# Patient Record
Sex: Male | Born: 1980 | Race: Black or African American | Hispanic: No | Marital: Single | Smoking: Current some day smoker
Health system: Southern US, Community
[De-identification: ages and names within clinical notes are randomized; demographics above are authoritative.]

## PROBLEM LIST (undated history)

## (undated) DIAGNOSIS — C9000 Multiple myeloma not having achieved remission: Secondary | ICD-10-CM

## (undated) DIAGNOSIS — K922 Gastrointestinal hemorrhage, unspecified: Secondary | ICD-10-CM

## (undated) DIAGNOSIS — K5090 Crohn's disease, unspecified, without complications: Secondary | ICD-10-CM

## (undated) DIAGNOSIS — F209 Schizophrenia, unspecified: Secondary | ICD-10-CM

## (undated) DIAGNOSIS — T50902A Poisoning by unspecified drugs, medicaments and biological substances, intentional self-harm, initial encounter: Secondary | ICD-10-CM

## (undated) DIAGNOSIS — D571 Sickle-cell disease without crisis: Secondary | ICD-10-CM

## (undated) DIAGNOSIS — S82841A Displaced bimalleolar fracture of right lower leg, initial encounter for closed fracture: Secondary | ICD-10-CM

## (undated) HISTORY — DX: Crohn's disease, unspecified, without complications: K50.90

## (undated) HISTORY — PX: MANDIBLE FRACTURE SURGERY: SHX706

---

## 1998-12-27 ENCOUNTER — Emergency Department (HOSPITAL_COMMUNITY): Admission: EM | Admit: 1998-12-27 | Discharge: 1998-12-27 | Payer: Self-pay | Admitting: Emergency Medicine

## 1999-01-06 ENCOUNTER — Emergency Department (HOSPITAL_COMMUNITY): Admission: EM | Admit: 1999-01-06 | Discharge: 1999-01-06 | Payer: Self-pay | Admitting: Emergency Medicine

## 1999-03-24 ENCOUNTER — Emergency Department (HOSPITAL_COMMUNITY): Admission: EM | Admit: 1999-03-24 | Discharge: 1999-03-24 | Payer: Self-pay | Admitting: Emergency Medicine

## 1999-03-30 ENCOUNTER — Emergency Department (HOSPITAL_COMMUNITY): Admission: EM | Admit: 1999-03-30 | Discharge: 1999-03-30 | Payer: Self-pay | Admitting: Emergency Medicine

## 2001-04-12 ENCOUNTER — Inpatient Hospital Stay (HOSPITAL_COMMUNITY): Admission: EM | Admit: 2001-04-12 | Discharge: 2001-04-19 | Payer: Self-pay | Admitting: Psychiatry

## 2001-05-29 ENCOUNTER — Inpatient Hospital Stay (HOSPITAL_COMMUNITY): Admission: EM | Admit: 2001-05-29 | Discharge: 2001-06-09 | Payer: Self-pay | Admitting: Psychiatry

## 2002-08-07 ENCOUNTER — Emergency Department (HOSPITAL_COMMUNITY): Admission: EM | Admit: 2002-08-07 | Discharge: 2002-08-07 | Payer: Self-pay

## 2004-12-31 ENCOUNTER — Emergency Department (HOSPITAL_COMMUNITY): Admission: EM | Admit: 2004-12-31 | Discharge: 2004-12-31 | Payer: Self-pay | Admitting: Emergency Medicine

## 2005-04-01 ENCOUNTER — Ambulatory Visit: Payer: Self-pay | Admitting: Family Medicine

## 2005-04-01 ENCOUNTER — Inpatient Hospital Stay (HOSPITAL_COMMUNITY): Admission: EM | Admit: 2005-04-01 | Discharge: 2005-04-06 | Payer: Self-pay | Admitting: Emergency Medicine

## 2005-04-04 ENCOUNTER — Encounter (INDEPENDENT_AMBULATORY_CARE_PROVIDER_SITE_OTHER): Payer: Self-pay | Admitting: *Deleted

## 2006-02-20 ENCOUNTER — Emergency Department (HOSPITAL_COMMUNITY): Admission: EM | Admit: 2006-02-20 | Discharge: 2006-02-20 | Payer: Self-pay | Admitting: Family Medicine

## 2006-07-25 ENCOUNTER — Emergency Department (HOSPITAL_COMMUNITY): Admission: EM | Admit: 2006-07-25 | Discharge: 2006-07-25 | Payer: Self-pay | Admitting: Family Medicine

## 2006-08-24 ENCOUNTER — Emergency Department (HOSPITAL_COMMUNITY): Admission: EM | Admit: 2006-08-24 | Discharge: 2006-08-24 | Payer: Self-pay | Admitting: Emergency Medicine

## 2007-03-17 ENCOUNTER — Emergency Department (HOSPITAL_COMMUNITY): Admission: EM | Admit: 2007-03-17 | Discharge: 2007-03-17 | Payer: Self-pay | Admitting: Family Medicine

## 2007-05-17 ENCOUNTER — Emergency Department (HOSPITAL_COMMUNITY): Admission: EM | Admit: 2007-05-17 | Discharge: 2007-05-17 | Payer: Self-pay | Admitting: Emergency Medicine

## 2007-06-17 ENCOUNTER — Emergency Department (HOSPITAL_COMMUNITY): Admission: EM | Admit: 2007-06-17 | Discharge: 2007-06-17 | Payer: Self-pay | Admitting: Emergency Medicine

## 2007-06-24 ENCOUNTER — Ambulatory Visit (HOSPITAL_COMMUNITY): Admission: RE | Admit: 2007-06-24 | Discharge: 2007-06-25 | Payer: Self-pay | Admitting: Otolaryngology

## 2007-07-01 ENCOUNTER — Inpatient Hospital Stay (HOSPITAL_COMMUNITY): Admission: AD | Admit: 2007-07-01 | Discharge: 2007-07-03 | Payer: Self-pay | Admitting: Otolaryngology

## 2007-08-14 ENCOUNTER — Ambulatory Visit (HOSPITAL_COMMUNITY): Admission: RE | Admit: 2007-08-14 | Discharge: 2007-08-14 | Payer: Self-pay | Admitting: Otolaryngology

## 2007-08-22 ENCOUNTER — Inpatient Hospital Stay (HOSPITAL_COMMUNITY): Admission: RE | Admit: 2007-08-22 | Discharge: 2007-08-31 | Payer: Self-pay | Admitting: Otolaryngology

## 2007-08-27 ENCOUNTER — Ambulatory Visit: Payer: Self-pay | Admitting: Infectious Diseases

## 2007-12-20 ENCOUNTER — Ambulatory Visit: Payer: Self-pay | Admitting: Internal Medicine

## 2007-12-20 ENCOUNTER — Inpatient Hospital Stay (HOSPITAL_COMMUNITY): Admission: EM | Admit: 2007-12-20 | Discharge: 2007-12-24 | Payer: Self-pay | Admitting: Emergency Medicine

## 2007-12-30 ENCOUNTER — Telehealth: Payer: Self-pay | Admitting: *Deleted

## 2007-12-31 ENCOUNTER — Encounter: Admission: RE | Admit: 2007-12-31 | Discharge: 2007-12-31 | Payer: Self-pay | Admitting: Hospitalist

## 2007-12-31 ENCOUNTER — Encounter: Payer: Self-pay | Admitting: *Deleted

## 2007-12-31 ENCOUNTER — Inpatient Hospital Stay (HOSPITAL_COMMUNITY): Admission: AD | Admit: 2007-12-31 | Discharge: 2008-01-01 | Payer: Self-pay | Admitting: Internal Medicine

## 2007-12-31 ENCOUNTER — Ambulatory Visit: Payer: Self-pay | Admitting: Hospitalist

## 2007-12-31 DIAGNOSIS — E876 Hypokalemia: Secondary | ICD-10-CM

## 2007-12-31 DIAGNOSIS — F1910 Other psychoactive substance abuse, uncomplicated: Secondary | ICD-10-CM

## 2007-12-31 DIAGNOSIS — K5190 Ulcerative colitis, unspecified, without complications: Secondary | ICD-10-CM | POA: Insufficient documentation

## 2007-12-31 LAB — CONVERTED CEMR LAB: Hgb A1c MFr Bld: 6 %

## 2008-06-08 ENCOUNTER — Emergency Department (HOSPITAL_COMMUNITY): Admission: EM | Admit: 2008-06-08 | Discharge: 2008-06-08 | Payer: Self-pay | Admitting: Emergency Medicine

## 2008-06-20 ENCOUNTER — Emergency Department (HOSPITAL_COMMUNITY): Admission: EM | Admit: 2008-06-20 | Discharge: 2008-06-20 | Payer: Self-pay | Admitting: Emergency Medicine

## 2008-07-18 ENCOUNTER — Emergency Department (HOSPITAL_COMMUNITY): Admission: EM | Admit: 2008-07-18 | Discharge: 2008-07-18 | Payer: Self-pay | Admitting: Emergency Medicine

## 2008-11-05 ENCOUNTER — Encounter: Payer: Self-pay | Admitting: *Deleted

## 2009-02-27 ENCOUNTER — Emergency Department (HOSPITAL_COMMUNITY): Admission: EM | Admit: 2009-02-27 | Discharge: 2009-02-27 | Payer: Self-pay | Admitting: Emergency Medicine

## 2009-08-17 ENCOUNTER — Ambulatory Visit: Payer: Self-pay | Admitting: Psychiatry

## 2009-08-17 ENCOUNTER — Other Ambulatory Visit: Payer: Self-pay

## 2009-08-17 ENCOUNTER — Inpatient Hospital Stay (HOSPITAL_COMMUNITY): Admission: AD | Admit: 2009-08-17 | Discharge: 2009-08-19 | Payer: Self-pay | Admitting: Psychiatry

## 2009-12-17 ENCOUNTER — Ambulatory Visit: Payer: Self-pay | Admitting: Internal Medicine

## 2009-12-17 ENCOUNTER — Encounter: Payer: Self-pay | Admitting: Internal Medicine

## 2009-12-17 ENCOUNTER — Inpatient Hospital Stay (HOSPITAL_COMMUNITY): Admission: EM | Admit: 2009-12-17 | Discharge: 2009-12-20 | Payer: Self-pay | Admitting: Emergency Medicine

## 2009-12-18 ENCOUNTER — Encounter: Payer: Self-pay | Admitting: Internal Medicine

## 2009-12-20 ENCOUNTER — Encounter: Payer: Self-pay | Admitting: Internal Medicine

## 2010-02-09 ENCOUNTER — Other Ambulatory Visit: Payer: Self-pay

## 2010-02-10 ENCOUNTER — Ambulatory Visit: Payer: Self-pay | Admitting: Psychiatry

## 2010-02-10 ENCOUNTER — Inpatient Hospital Stay (HOSPITAL_COMMUNITY): Admission: AD | Admit: 2010-02-10 | Discharge: 2010-02-14 | Payer: Self-pay | Admitting: Psychiatry

## 2010-11-15 NOTE — Initial Assessments (Signed)
INTERNAL MEDICINE ADMISSION HISTORY AND PHYSICAL  PCP: Dr Redmond Pulling.  1st contact Dr Stanford Scotland [phone] 2nd contact Dr Wendee Beavers  [phone] Holidays or 5pm on weekdays:  1st contact [phone] 2nd contact [phone]  CC: bloody diarrhea.  HPI: Patient is a 30 year old male with PMH significant for UC s/p GIB x2 and Schizophrenia, who presented to ED with a chief complaint of rectal bleeding which started 2 weeks ago, 2-3 watery BM a day with a small amount of blood visible in stool. Patient also states that he is having some nausea and clear vomiting since his diarrhea started with a frequency of 1-2 x a day.  Patient denies SOB, cough, CP, or any other complaints.   ALLERGIES: NKDA   PAST MEDICAL HISTORY: ? of Diabetes mellitus Ulcerative colitis s/p GIB x 2 Schizophrenia Thrombocytosis Hypokalemia PSA - THC, cocaine Mandibular fracture s/p ORIF and I&D for infection   MEDICATIONS: ASACOL 400 MG  TBEC (MESALAMINE) Take 1 tablet by mouth three times a day ZYPREXA 20 MG  TABS (OLANZAPINE) Take 1 tablet by mouth once a day   SOCIAL HISTORY: currently in prison. Occupation: unemployed Single Current Smoker, about 3 cigs/day Alcohol use-yes, former Drug use-yes former   FAMILY HISTORY: M living 19 htn F living 46 htn 1 Sibling A&W No children   ROS:  As per HPI, all other systems reviewed and are negative.  VITALS: T: 98.6 P: 102  BP: 108/64  R: 20  O2SAT: 99%  ON: RA  PHYSICAL EXAM: General:  Engineer, structural at bedside, patient handcuffed to bed, NAD.    Head:  normocephalic and atraumatic.   Eyes:  vision grossly intact, pupils equal, pupils round, pupils reactive to light, no injection and anicteric.   Mouth:  pharynx dry, no erythema, and no exudates.   Neck:  supple, full ROM, no thyromegaly, no JVD, and no carotid bruits.   Lungs:  normal respiratory effort, no accessory muscle use, normal breath sounds, no crackles, and no wheezes.  Heart:  tachy with regular  rhythm, no murmur, no gallop, and no rub.   Abdomen:  soft, non-tender, normal bowel sounds, no distention, no guarding, no rebound tenderness, no hepatomegaly, and no splenomegaly.   Msk:  no joint swelling, no joint warmth, and no redness over joints.   Pulses:  2+ DP/PT pulses bilaterally Extremities:  No cyanosis, clubbing, edema  Neurologic:  alert & oriented X3, cranial nerves II-XII intact, strength normal in all extremities, sensation intact to light touch, and gait normal.   Skin:  dry and no rashes.   Psych:  appears appropriate at time of erxamination.  LABS: Occult Blood, Fecal                      POSITIVE   WBC                                      18.0       h      4.0-10.5         K/uL  RBC                                      4.78              4.22-5.81        MIL/uL  Hemoglobin (HGB)                         14.1              13.0-17.0        g/dL  Hematocrit (HCT)                         41.0              39.0-52.0        %  MCV                                      85.6              78.0-100.0       fL  MCHC                                     34.4              30.0-36.0        g/dL  RDW                                      13.8              11.5-15.5        %  Platelet Count (PLT)                     333               150-400          K/uL  Neutrophils, %                           72                43-77            %  Lymphocytes, %                           7          l      12-46            %  Monocytes, %                             13         h      3-12             %  Eosinophils, %                           7          h      0-5              %  Basophils, %  0                 0-1              %  Neutrophils, Absolute                    13.0       h      1.7-7.7          K/uL  Lymphocytes, Absolute                    1.3               0.7-4.0          K/uL  Monocytes, Absolute                      2.4        h      0.1-1.0          K/uL   Eosinophils, Absolute                    1.3        h      0.0-0.7          K/uL  Basophils, Absolute                      0.0               0.0-0.1          K/uL   Sodium (NA)                              133        l      135-145          mEq/L  Potassium (K)                            2.8        l      3.5-5.1          mEq/L  Chloride                                 92         l      96-112           mEq/L  CO2                                      30                19-32            mEq/L  Glucose                                  127        h      70-99            mg/dL  BUN  8                 6-23             mg/dL  Creatinine                               1.20              0.4-1.5          mg/dL  GFR, Est Non African American            >60               >60              mL/min  GFR, Est African American                >60               >60              mL/min    Oversized comment, see footnote  1  Bilirubin, Total                         0.7               0.3-1.2          mg/dL  Alkaline Phosphatase                     70                39-117           U/L  SGOT (AST)                               18                0-37             U/L  SGPT (ALT)                               15                0-53             U/L  Total  Protein                           6.8               6.0-8.3          g/dL  Albumin-Blood                            2.7        l      3.5-5.2          g/dL  Calcium                                  8.0        l      8.4-10.5         mg/dL   Protime ( Prothrombin Time)  16.3       h      11.6-15.2        seconds  INR                                      1.32              0.00-1.49   CT IMPRESSION:   Colitis from the splenic flexure through the rectum.  Appearance is   consistent with infectious or inflammatory change.  Small   retroperitoneal lymph nodes are consistent with reactive   adenopathy.  ASSESSMENT AND  PLAN:  Ulcerative Collitis exac: Ongoing for >2 week in the setting of untreated left sided ulcerative collitis, Hg 14.1, FOBT +. CT ABD shows collitis with adenopathy>> infection vs inflammation. Will get GI consult for possible colonoscopy and make patient NPO for now.  Will restart mesalamine 1669m three times a day in setting of acute exac. NS IVF as patient is dehydrated.  Leukocytosis: Patient has a WBC of 18, This may be from infection vs demargination. Patient is afebrile, we will check a UA and CXR and Blood culture x2, stool cx, stool O&A, Cdiff.  Will empirically start patient on Flagyl and Cipro until cultures are back.  Hypokalemia: 2.8 on admission, patient has a recent hx of diarrhea which could explain for the hypokalemia, will repleat, and check Mg and po4 level, will continue to monitor closely.   Hyponatremia: serum osmolality 276 in the setting of diarrhea, Borderline low Na at 133, this is most likely 2/2 to dehydration. will give IVF  Hyperglycemia: CBG's elevated on admission, No Documented history of DM, Will Check HgA1c, start checking CBG ACHS, will not cover with SSI for now and will consider starting if CBG are significantly elevated.    Schizophrenia: No active issues at time of admission, Will continue home medication Olanzapine.  VTE Proph: SCD   ATTENDING: I performed and/or observed a history and physical examination of the patient.  I discussed the case with the residents as noted and reviewed the residents' notes.  I agree with the findings and plan--please refer to the attending physician note for more details.  Signature________________________________  Printed Name_____________________________

## 2010-11-15 NOTE — Miscellaneous (Signed)
Summary: [facility] Discharge  Date of admission:12/17/2009  Date of discharge:12/18/2009  Brief reason for admission/active problems: 1. Ulcerative colitis. Patient did not have any episodes of rectal bleeding, diarrhea, nausea or vomitting during hospitalization. Hemoglobin remained stable at 14.0. 2. Hypokakalemia, likely 2/2/ volume contraction due to diarrhea. Was repleted.  Followup needed:Please, recheck his basic metabolic panel to reassure that electrolytes WNL.   The medication and problem lists have been updated.  Please see the dictated discharge summary for details.   Medications: Removed medication of ZYPREXA 20 MG  TABS (OLANZAPINE) Take 1 tablet by mouth once a day Added new medication of RISPERDAL 3 MG TABS (RISPERIDONE) Take 1 tab by mouth at bedtime - Signed Added new medication of BENZTROPINE MESYLATE 1 MG TABS (BENZTROPINE MESYLATE) Take 1 tab by mouth at bedtime - Signed Rx of RISPERDAL 3 MG TABS (RISPERIDONE) Take 1 tab by mouth at bedtime;  #30 x 0;  Signed;  Entered by: Milana Obey MD;  Authorized by: Milana Obey MD;  Method used: Print then Give to Patient Rx of BENZTROPINE MESYLATE 1 MG TABS (BENZTROPINE MESYLATE) Take 1 tab by mouth at bedtime;  #30 x 0;  Signed;  Entered by: Milana Obey MD;  Authorized by: Milana Obey MD;  Method used: Print then Give to Patient Rx of ASACOL 400 MG  TBEC (MESALAMINE) Take 1 tablet by mouth three times a day;  #0 x 0;  Signed;  Entered by: Milana Obey MD;  Authorized by: Milana Obey MD;  Method used: Print then Give to Patient Observations: Added new observation of INSTRUCTIONS: Please follow up with your primary care physician within 2-3 days after a discharge. Restart Mesalamine 400 mg by mouth three times a day. Please take your medication as prescribed below. Call the [facility] with any Questions. (12/18/2009 9:05)    Prescriptions: ASACOL 400 MG  TBEC  (MESALAMINE) Take 1 tablet by mouth three times a day  #0 x 0   Entered and Authorized by:   Milana Obey MD   Signed by:   Milana Obey MD on 12/18/2009   Method used:   Print then Give to Patient   RxID:   [id number] BENZTROPINE MESYLATE 1 MG TABS (BENZTROPINE MESYLATE) Take 1 tab by mouth at bedtime  #30 x 0   Entered and Authorized by:   Milana Obey MD   Signed by:   Milana Obey MD on 12/18/2009   Method used:   Print then Give to Patient   RxID:   [id number] RISPERDAL 3 MG TABS (RISPERIDONE) Take 1 tab by mouth at bedtime  #30 x 0   Entered and Authorized by:   Milana Obey MD   Signed by:   Milana Obey MD on 12/18/2009   Method used:   Print then Give to Patient   RxID:   [id number]    Patient Instructions: 1)  Please follow up with your primary care physician within 2-3 days after a discharge. 2)  Restart Mesalamine 400 mg by mouth three times a day. 3)  Please take your medication as prescribed below. 4)  Call the [facility] with any Questions.

## 2010-11-15 NOTE — Miscellaneous (Signed)
Summary: Addendum to [facility] Discharge  Date of admission:12/13/2009  Date of discharge:12/20/2009  Brief reason for admission/active problems: 1.Ulcerative colitis. No abdominal pain, hematechezia, melena or diarrhea throughout the hospital stay. Restarted on Mesalamine. Plan is to keep him on 1600 mg by mouth three times a day for 3 weeks (stop on 01/03/2010); and then decrease to 400 mg by mouth three times a day.  2. Anemia, transient --> 2/2 GI bleed/UC. Admitted with Hgb of 14. Dropped Hgb by 2 points --likely hemodilutional. Hemoglobin is trending back up. 3. Hypotension 2/2 volume contraction. Aggressively repleted with IVF. 4. Szhizophrenia. Stable. Continued with Congentin and Risperdal. 5. Hypokalemia 2/2 diarrhea --repleted.  Followup needed:Please, recheck CBC to evaluate for Anemia; B-met to reasess for hypokalemia; and BP to reassess for hypotension. Would need to f/u with a gastroenterologist within 3 weeks.  The medication and problem lists have been updated.  Please see the dictated discharge summary for details.   Medications: Changed medication from ASACOL 400 MG  TBEC (MESALAMINE) Take 1 tablet by mouth three times a day to ASACOL HD 800 MG TBEC (MESALAMINE) Take 2 tablet by mouth three times a day  for 3 weeks (stop on 01/03/2010); then change to one tablet by mouth two times daily - Signed Added new medication of CIPRO 500 MG TABS (CIPROFLOXACIN HCL) Take 1 tablet by mouth two times a day for 3 more days - Signed Added new medication of METRONIDAZOLE 500 MG TABS (METRONIDAZOLE) Take 1 tablet by mouth three times a day for 3 more days - Signed Added new medication of POTASSIUM CHLORIDE 20 MEQ PACK (POTASSIUM CHLORIDE) Take 1 tablet by mouth once a day x 4 days - Signed Rx of ASACOL HD 800 MG TBEC (MESALAMINE) Take 2 tablet by mouth three times a day  for 3 weeks (stop on 01/03/2010); then change to one tablet by mouth two times daily;  #90 x 5;  Signed;   Entered by: Milana Obey MD;  Authorized by: Milana Obey MD;  Method used: Print then Give to Patient Rx of CIPRO 500 MG TABS (CIPROFLOXACIN HCL) Take 1 tablet by mouth two times a day for 3 more days;  #6 x 0;  Signed;  Entered by: Milana Obey MD;  Authorized by: Milana Obey MD;  Method used: Print then Give to Patient Rx of METRONIDAZOLE 500 MG TABS (METRONIDAZOLE) Take 1 tablet by mouth three times a day for 3 more days;  #9 x 0;  Signed;  Entered by: Milana Obey MD;  Authorized by: Milana Obey MD;  Method used: Print then Give to Patient Rx of POTASSIUM CHLORIDE 20 MEQ PACK (POTASSIUM CHLORIDE) Take 1 tablet by mouth once a day x 4 days;  #4 x 0;  Signed;  Entered by: Milana Obey MD;  Authorized by: Milana Obey MD;  Method used: Print then Give to Patient Rx of CIPRO 500 MG TABS (CIPROFLOXACIN HCL) Take 1 tablet by mouth two times a day for 3 more days;  #6 x 0;  Signed;  Entered by: Milana Obey MD;  Authorized by: Milana Obey MD;  Method used: Print then Give to Patient Rx of METRONIDAZOLE 500 MG TABS (METRONIDAZOLE) Take 1 tablet by mouth three times a day for 3 more days;  #9 x 0;  Signed;  Entered by: Milana Obey MD;  Authorized by: Milana Obey MD;  Method used: Print then Give to Patient Rx of POTASSIUM CHLORIDE 20 MEQ PACK (POTASSIUM CHLORIDE) Take 1 tablet  by mouth once a day x 4 days;  #4 x 0;  Signed;  Entered by: Milana Obey MD;  Authorized by: Milana Obey MD;  Method used: Print then Give to Patient Observations: Added new observation of INSTRUCTIONS: Please call  the outpatient clinic at [facility] with any questions. Take your medications as prescribed. Your medications doses were adjusted.  Please take your medication as prescribed below. (12/20/2009 7:57)    Prescriptions: POTASSIUM CHLORIDE 20 MEQ PACK (POTASSIUM CHLORIDE) Take 1 tablet by mouth once a day x 4 days  #4 x 0   Entered and Authorized by:   Milana Obey MD   Signed by:   Milana Obey MD on 12/20/2009   Method used:   Print then Give to Patient   RxID:   [id number] METRONIDAZOLE 500 MG TABS (METRONIDAZOLE) Take 1 tablet by mouth three times a day for 3 more days  #9 x 0   Entered and Authorized by:   Milana Obey MD   Signed by:   Milana Obey MD on 12/20/2009   Method used:   Print then Give to Patient   RxID:   [id number] CIPRO 500 MG TABS (CIPROFLOXACIN HCL) Take 1 tablet by mouth two times a day for 3 more days  #6 x 0   Entered and Authorized by:   Milana Obey MD   Signed by:   Milana Obey MD on 12/20/2009   Method used:   Print then Give to Patient   RxID:   [id number] POTASSIUM CHLORIDE 20 MEQ PACK (POTASSIUM CHLORIDE) Take 1 tablet by mouth once a day x 4 days  #4 x 0   Entered and Authorized by:   Milana Obey MD   Signed by:   Milana Obey MD on 12/20/2009   Method used:   Print then Give to Patient   RxID:   [id number] METRONIDAZOLE 500 MG TABS (METRONIDAZOLE) Take 1 tablet by mouth three times a day for 3 more days  #9 x 0   Entered and Authorized by:   Milana Obey MD   Signed by:   Milana Obey MD on 12/20/2009   Method used:   Print then Give to Patient   RxID:   [id number] CIPRO 500 MG TABS (CIPROFLOXACIN HCL) Take 1 tablet by mouth two times a day for 3 more days  #6 x 0   Entered and Authorized by:   Milana Obey MD   Signed by:   Milana Obey MD on 12/20/2009   Method used:   Print then Give to Patient   RxID:   [id number] ASACOL HD 800 MG TBEC (MESALAMINE) Take 2 tablet by mouth three times a day  for 3 weeks (stop on 01/03/2010); then change to one tablet by mouth two times daily  #90 x 5   Entered and Authorized by:   Milana Obey MD   Signed by:   Milana Obey MD on 12/20/2009   Method used:   Print then Give to Patient   RxID:   [id number]    Patient Instructions: 1)  Please call  the outpatient clinic at [facility] with any questions. 2)  Take your medications as prescribed. 3)  Your medications doses were adjusted. 4)  Please take your medication as prescribed below.

## 2011-01-03 LAB — COMPREHENSIVE METABOLIC PANEL
Alkaline Phosphatase: 90 U/L (ref 39–117)
CO2: 28 mEq/L (ref 19–32)
Chloride: 105 mEq/L (ref 96–112)
Creatinine, Ser: 1.06 mg/dL (ref 0.4–1.5)
GFR calc Af Amer: >60 mL/min (ref >60)
GFR calc non Af Amer: >60 mL/min (ref >60)
Total Protein: 8.2 g/dL (ref 6.0–8.3)

## 2011-01-03 LAB — DIFFERENTIAL
Eosinophils Absolute: 1 10*3/uL — ABNORMAL HIGH (ref 0.0–0.7)
Eosinophils Relative: 10 % — ABNORMAL HIGH (ref 0–5)
Lymphocytes Relative: 24 % (ref 12–46)
Lymphs Abs: 2.4 10*3/uL (ref 0.7–4.0)
Monocytes Absolute: 0.9 10*3/uL (ref 0.1–1.0)
Neutrophils Relative %: 57 % (ref 43–77)

## 2011-01-03 LAB — CBC
HCT: 41.4 % (ref 39.0–52.0)
Hemoglobin: 13.6 g/dL (ref 13.0–17.0)
Platelets: 427 10*3/uL — ABNORMAL HIGH (ref 150–400)
RDW: 15.5 % (ref 11.5–15.5)

## 2011-01-03 LAB — ETHANOL: Alcohol, Ethyl (B): <5 mg/dL (ref 0–10)

## 2011-01-03 LAB — RAPID URINE DRUG SCREEN, HOSP PERFORMED: Barbiturates: NOT DETECTED

## 2011-01-08 LAB — CBC
HCT: 33 % — ABNORMAL LOW (ref 39.0–52.0)
HCT: 34.1 % — ABNORMAL LOW (ref 39.0–52.0)
HCT: 34.1 % — ABNORMAL LOW (ref 39.0–52.0)
HCT: 34.8 % — ABNORMAL LOW (ref 39.0–52.0)
HCT: 41 % (ref 39.0–52.0)
Hemoglobin: 11.2 g/dL — ABNORMAL LOW (ref 13.0–17.0)
Hemoglobin: 11.7 g/dL — ABNORMAL LOW (ref 13.0–17.0)
Hemoglobin: 14.1 g/dL (ref 13.0–17.0)
MCHC: 34 g/dL (ref 30.0–36.0)
MCHC: 34.4 g/dL (ref 30.0–36.0)
MCV: 85.6 fL (ref 78.0–100.0)
MCV: 86.4 fL (ref 78.0–100.0)
MCV: 87.2 fL (ref 78.0–100.0)
Platelets: 348 10*3/uL (ref 150–400)
Platelets: 366 10*3/uL (ref 150–400)
Platelets: 400 10*3/uL (ref 150–400)
RBC: 4.03 MIL/uL — ABNORMAL LOW (ref 4.22–5.81)
RBC: 4.78 MIL/uL (ref 4.22–5.81)
RDW: 14.4 % (ref 11.5–15.5)
RDW: 14.5 % (ref 11.5–15.5)
RDW: 14.6 % (ref 11.5–15.5)
WBC: 12.1 10*3/uL — ABNORMAL HIGH (ref 4.0–10.5)
WBC: 13.6 10*3/uL — ABNORMAL HIGH (ref 4.0–10.5)
WBC: 16.8 10*3/uL — ABNORMAL HIGH (ref 4.0–10.5)

## 2011-01-08 LAB — BASIC METABOLIC PANEL
BUN: 3 mg/dL — ABNORMAL LOW (ref 6–23)
BUN: 3 mg/dL — ABNORMAL LOW (ref 6–23)
BUN: 6 mg/dL (ref 6–23)
CO2: 26 mEq/L (ref 19–32)
Calcium: 7.5 mg/dL — ABNORMAL LOW (ref 8.4–10.5)
Calcium: 8 mg/dL — ABNORMAL LOW (ref 8.4–10.5)
Chloride: 107 mEq/L (ref 96–112)
Creatinine, Ser: 0.78 mg/dL (ref 0.4–1.5)
Creatinine, Ser: 0.86 mg/dL (ref 0.4–1.5)
GFR calc Af Amer: >60 mL/min (ref >60)
GFR calc non Af Amer: >60 mL/min (ref >60)
GFR calc non Af Amer: >60 mL/min (ref >60)
GFR calc non Af Amer: >60 mL/min (ref >60)
Glucose, Bld: 100 mg/dL — ABNORMAL HIGH (ref 70–99)
Glucose, Bld: 111 mg/dL — ABNORMAL HIGH (ref 70–99)
Glucose, Bld: 137 mg/dL — ABNORMAL HIGH (ref 70–99)
Potassium: 3.2 mEq/L — ABNORMAL LOW (ref 3.5–5.1)
Potassium: 3.4 mEq/L — ABNORMAL LOW (ref 3.5–5.1)
Sodium: 135 mEq/L (ref 135–145)
Sodium: 138 mEq/L (ref 135–145)

## 2011-01-08 LAB — URINALYSIS, ROUTINE W REFLEX MICROSCOPIC
Nitrite: NEGATIVE
Specific Gravity, Urine: 1.01 (ref 1.005–1.030)
Urobilinogen, UA: 0.2 mg/dL (ref 0.0–1.0)

## 2011-01-08 LAB — LIPID PANEL
HDL: 19 mg/dL — ABNORMAL LOW (ref >39)
LDL Cholesterol: 59 mg/dL (ref 0–99)
Total CHOL/HDL Ratio: 5.2 RATIO
Triglycerides: 99 mg/dL (ref <150)
VLDL: 20 mg/dL (ref 0–40)

## 2011-01-08 LAB — PROTIME-INR
INR: 1.32 (ref 0.00–1.49)
Prothrombin Time: 16.3 seconds — ABNORMAL HIGH (ref 11.6–15.2)

## 2011-01-08 LAB — DIFFERENTIAL
Basophils Relative: 0 % (ref 0–1)
Eosinophils Absolute: 1.3 10*3/uL — ABNORMAL HIGH (ref 0.0–0.7)
Lymphs Abs: 1.3 10*3/uL (ref 0.7–4.0)
Neutrophils Relative %: 72 % (ref 43–77)

## 2011-01-08 LAB — CULTURE, BLOOD (ROUTINE X 2): Culture: NO GROWTH

## 2011-01-08 LAB — CLOSTRIDIUM DIFFICILE EIA

## 2011-01-08 LAB — COMPREHENSIVE METABOLIC PANEL
ALT: 15 U/L (ref 0–53)
CO2: 30 mEq/L (ref 19–32)
Calcium: 8 mg/dL — ABNORMAL LOW (ref 8.4–10.5)
GFR calc non Af Amer: >60 mL/min (ref >60)
Glucose, Bld: 127 mg/dL — ABNORMAL HIGH (ref 70–99)
Sodium: 133 mEq/L — ABNORMAL LOW (ref 135–145)

## 2011-01-08 LAB — STOOL CULTURE

## 2011-01-08 LAB — MAGNESIUM
Magnesium: 1.9 mg/dL (ref 1.5–2.5)
Magnesium: 2 mg/dL (ref 1.5–2.5)

## 2011-01-08 LAB — GIARDIA/CRYPTOSPORIDIUM SCREEN(EIA)
Cryptosporidium Screen (EIA): NEGATIVE
Giardia Screen - EIA: NEGATIVE

## 2011-01-18 LAB — BASIC METABOLIC PANEL
CO2: 26 mEq/L (ref 19–32)
Calcium: 9.2 mg/dL (ref 8.4–10.5)
Creatinine, Ser: 0.81 mg/dL (ref 0.4–1.5)
GFR calc Af Amer: >60 mL/min (ref >60)
GFR calc non Af Amer: >60 mL/min (ref >60)
Glucose, Bld: 110 mg/dL — ABNORMAL HIGH (ref 70–99)
Sodium: 139 mEq/L (ref 135–145)

## 2011-01-18 LAB — DIFFERENTIAL
Lymphocytes Relative: 33 % (ref 12–46)
Lymphs Abs: 3.1 10*3/uL (ref 0.7–4.0)
Monocytes Relative: 9 % (ref 3–12)
Neutro Abs: 4.8 10*3/uL (ref 1.7–7.7)
Neutrophils Relative %: 51 % (ref 43–77)

## 2011-01-18 LAB — CBC
RBC: 5.59 MIL/uL (ref 4.22–5.81)
WBC: 9.4 10*3/uL (ref 4.0–10.5)

## 2011-02-28 NOTE — Discharge Summary (Signed)
[name], [name]               ACCOUNT NO.:  [id number]   MEDICAL RECORD NO.:  [id number]          PATIENT TYPE:  INP   LOCATION:  2878                         FACILITY:  [facility]   PHYSICIAN:  Leta Baptist, MD            DATE OF BIRTH:  October 20, 1980   DATE OF ADMISSION:  08/22/2007  DATE OF DISCHARGE:  08/31/2007                               DISCHARGE SUMMARY   ADMITTING DIAGNOSES:  1. Bilateral neck abscess.  2. 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   DISCHARGE DIAGNOSES:  1. Bilateral neck abscess.  2. 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   CONSULTING SERVICE:  Infectious disease service.   PROCEDURE PERFORMED:  1. Incision and drainage of bilateral neck abscess and removal of      bilateral mandibular fixation plates.  2. Debridement of bilateral neck infections with removal of the left      submandibular gland.  3. Placement of PICC line.   HISTORY OF PRESENT ILLNESS:  [name] is a 30 year old African-  American male with a history of traumatic bilateral mandibular fracture.  He underwent open reduction internal fixation of the fracture in  September 2008.  Since then, he has been experiencing a recurrent  infection in his neck.  He was previously admitted for IV antibiotics.  He ws also treated with multiple courses of p.o. antibiotics.  However,  on August 22, 2007, the patient was again noted to have purulent  drainage from his neck wound.  Based on that finding, the decision was  made for the patient to undergo removal of the mandibular hardware and  incision and drainage of the abscess.   PAST MEDICAL HISTORY:  1. Diabetes.  2. Schizophrenia.   PAST SURGICAL HISTORY:  Nasal fracture repair and open reduction  internal fixation of the mandibular fracture.   HOME MEDICATIONS:  1. Metformin.  2. Depakote.  3. Olanzapine.   ALLERGIES:  No known drug allergies.   HOSPITAL COURSE:  The patient was admitted on  August 22, 2007.  He was  taken to the operating room and underwent incision and drainage of the  deep neck abscesses and removal of the bilateral mandibular fixation  plate.  He tolerated the procedure well without any complications.  He  was transferred to the regular surgical floor after the surgery.  The  patient was initially started on IV clindamycin antibiotics.  He was  doing well for the first 3 hospital days.  However, on hospital day #4,  he was noted to have a recurrent accumulation of the neck abscess.  In  addition, there was also a small amount of salivary drainage from the  neck wound.  Based on those findings, the patient was taken back to the  operating room for debridement of the neck infection and also removal of  the left submandibular gland.  The patient again tolerated the procedure  well.   During his hospitalization, infectious disease service was consulted.  It was determined that the patient would likely need long-term  antibiotics to prevent any recurrence of the abscess.  His antibiotic  was changed from clindamycin to Zosyn 4.5 grams IV q.8h.   After his second procedure, the patient recovered rapidly with no  complications.  He remained afebrile since the completion of the second  debridement procedure.  On August 31, 2007, the patient was discharged  home in good condition.   It should be noted that the patient's mandible was healing well with  good rigidity.  No further fixation is needed at this time.   DISCHARGE DIET:  A diabetic, soft mechanical diet.   DISCHARGE MEDICATIONS:  Zosyn 4.5 grams IV q.8h. for 4 weeks.  Medications will be administered through the peripherally inserted  central line.   DISCHARGE ACTIVITIES:  Up as tolerated.   FOLLOWUP INSTRUCTIONS:  The patient will follow up in my office in 3  days for repeat evaluation.      Leta Baptist, MD  Electronically Signed     ST/MEDQ  D:  08/31/2007  T:  08/31/2007  Job:  [id number]

## 2011-02-28 NOTE — Discharge Summary (Signed)
[name], [name]               ACCOUNT NO.:  [id number]   MEDICAL RECORD NO.:  [id number]          PATIENT TYPE:  INP   LOCATION:  5528                         FACILITY:  [facility]   PHYSICIAN:  Evette Doffing, M.D.  DATE OF BIRTH:  [birth year]   DATE OF ADMISSION:  12/31/2007  DATE OF DISCHARGE:  01/01/2008                               DISCHARGE SUMMARY   DISCHARGE DIAGNOSES:  1. Nausea and vomiting caused by viral gastroenteritis.  2. Schizophrenia.  3. Ulcerative colitis.  4. Polysubstance abuse.  5. Iron-deficiency anemia.  6. Reactive thrombocytosis.   DISCHARGE MEDICATIONS:  1. Asacol 400 mg p.o. 3 times daily.  2. Zyprexa 5 mg p.o. daily.  3. Compazine 10 p.o. every 6 hours as needed for nausea.  4. Ferrous sulfate 250 mg p.o. daily.   HOSPITAL FOLLOWUP:  The patient will make an appointment with the  outpatient clinic in 1 to 2 weeks if his nausea worsens.   PROCEDURE PERFORMED:  Acute abdominal series including small bowel  dilatation indicating possible ileus versus early small bowel  obstruction.   BRIEF ADMITTING HISTORY AND PHYSICAL:  This is a 30 year old male with a  history of schizophrenia and a history of ulcerative colitis with a  recent flare in early March.  Following that flare of his ulcerative  colitis, he was discharged home on ciprofloxacin for 5 days.  He  presented to clinic today for followup of recent nausea and vomiting.  Apparently, he has been vomiting 2 to 3 times per day for the last 4  days.  He has not been able to keep much food down during that time.  He  denies any abdominal pain, diarrhea, or chest pain.  He also denies any  recent shortness of breath or coughing.   PHYSICAL EXAMINATION:  ADMITTING VITALS:  Temperature is 99.2, blood  pressure 122/75, the pulse is 112, respiratory rate is 16, saturating  97% on room air.  Orthostatic blood pressures systolic lying flat  945/03, blood pressure sitting 97/60, blood pressure standing  84/55.  GENERAL:  The patient in no acute distress, young, and healthy-appearing  male.  EYES:  Extraocular eye movements intact.  Pupils equally round and  reactive to light.  ENT:  Oropharynx is clear without erythema or exudates.  NECK:  Supple without lymphadenopathy.  RESPIRATORY:  There is good air movement bilaterally.  Lungs are clear.  CARDIOVASCULAR:  The patient is tachycardic but has a regular rhythm.  No murmurs, rubs, or gallops.  GI:  Soft, nontender, and nondistended with good bowel sounds.  EXTREMITIES:  No edema.  SKIN:  No skin rashes.  NEURO:  Alert, awake, and oriented x3.  Grossly nonfocal.  PSYCH:  Appropriate.   ADMITTING LABS:  Sodium 138, potassium 3.6, chloride 103, bicarbonate  31, BUN 6, creatinine is 0.95, glucose 119, hemoglobin 8.9, white count  10.9, platelets 660,000, and lipase 20.   HOSPITAL COURSE BY PROBLEM:  1. Nausea and vomiting.  The patient came in with a 4-day history of      repeated nausea and vomiting.  He was orthostatic on  presentation.      The cause for his nausea and vomiting was likely related to viral      gastroenteritis.  The patient does have a self-reported history of      polydipsia.  He was drinking up to 6 L of water a day despite      having some significant nausea after drinking this amount.  He was      told prior to his last discharge that he needs to drink more water      and for some reason he has taken that to an extreme.  On      questioning his mother, however, it was discovered that this is      likely not the case.  He has not been drinking nearly as much water      as he indicates in history.  In either case, most likely cause of      the patient's nausea and vomiting is viral gastroenteritis.  We      gave the patient IV fluids during the first day of admission and      continued those the next morning.  We did allow the patient a clear      liquid diet originally, and he tolerated that well initially.  He       did have one case of vomiting at 4 a.m. the next morning.  We      backed off the patient's diet for breakfast and the lunch.  We did      advance his diet to clear liquids by dinner time.  He tolerated      this meal very well.  He did not have any nausea or vomiting at      that time.  He was no longer orthostatic.  He again was not      complaining of any abdominal pain, fever, chills, or diarrhea.  We      felt the patient was stable to go home, and we had offered the      patient Compazine for his nausea if it was to return.  2. Schizophrenia.  The patient was alert and oriented.  He is      appropriate during exam.  He does share a history of polydipsia.      We warned the patient against the practice of this and recommended      only that he have 5 to 6 cups of water per day rather than liters.      We continued the patient's Zyprexa at same dose of this on an      outpatient basis.  Of note, the patient has been taking Zyprexa for      quite a while, and he has not been experiencing any nausea with it.  3. Iron-deficiency anemia.  The patient is FOBT positive x2 during his      hospitalization.  He has ulcerative colitis. This is likely the      cause of his anemia.  We started the patient on iron supplements.      He should take these on the outpatient basis.  His anemia appears      to be stable, as his hemoglobin was in early March 8.6 and now his      hemoglobin is 8.9.  In order to reduce the amount of side effects      related to nausea and vomiting, constipation and diarrhea that iron      supplements  can cause, we will start at a low dose at 250 mg p.o.      daily.  We will increase the dose as tolerated.  4. Questionable history of diabetes.  The patient's fasting blood      glucose during hospitalization was 84.  He does not have diabetes      mellitus.  He should not be given any diabetic medications on the      outpatient basis.  5. Polysubstance abuse.  The patient  does have history of substance      abuse including cocaine, marijuana.  His UDS was positive for      marijuana on admission.  We counseled the patient about substance      abuse and adverse effects it can have on one's health.  He will      need to abstain from these to preserve his relative state of health      condition in the future.  6. The patient has significant thrombocytosis.  His last platelets      were 683,000.  He has a history of this.  This is likely related to      his ulcerative colitis.  We will monitor this closely in the      outpatient clinic.   DISCHARGE LABS:  Sodium 141, potassium 3.7, chloride 108, bicarbonate  28, BUN 4, creatinine is 0.92, glucose 84, hemoglobin 8.4, and platelets  683,000.      Caroleen Hamman, MD  Electronically Signed      Evette Doffing, M.D.  Electronically Signed    CB/MEDQ  D:  01/01/2008  T:  01/02/2008  Job:  [id number]
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
## 2011-02-28 NOTE — Op Note (Signed)
[name], [name]               ACCOUNT NO.:  [id number]   MEDICAL RECORD NO.:  [id number]          PATIENT TYPE:  INP   LOCATION:  0947                         FACILITY:  [facility]   PHYSICIAN:  Leta Baptist, MD            DATE OF BIRTH:  [birth year]   DATE OF PROCEDURE:  08/28/2007  DATE OF DISCHARGE:                               OPERATIVE REPORT   SURGEON:  Leta Baptist, MD   PREOPERATIVE DIAGNOSIS:  Bilateral neck abscess.   POSTOPERATIVE DIAGNOSIS:  Bilateral neck abscess.   PROCEDURE PERFORMED:  Debridement and drainage of bilateral neck  abscess.   ANESTHESIA:  General endotracheal tube anesthesia.   COMPLICATIONS:  None.   ESTIMATED BLOOD LOSS:  20 mL.   INDICATIONS FOR PROCEDURE:  [name] is a 30 year old African  American male with history of bilateral recurrent neck abscess.  After  he underwent open reduction and internal fixation of his bilateral  comminuted mandibular fracture.  On August 23, 2007, the patient was  taken to the operating room for removal of his mandibular hardware.  The  patient was doing well in the hospital for three days.  However, on  August 27, 2007, he developed significant amount of purulent drainage  from his neck incision.  Bedside debridement of the abscess cavity to  reveals significant amount of devitalized tissue that included the  submandibular gland.  Based on that finding, the decision was made for  the patient to undergo operative debridement in the operating room.  This included possible removal of the abscessed left submandibular  gland.   DESCRIPTION OF PROCEDURE:  The patient was taken to the operating room  and placed supine on the operating table.  General endotracheal tube  anesthesia was administered by the anesthesiologist.  The patient was  then positioned and prepped and draped in a standard fashion for  debridement of the neck abscess.  Attention was first focused on the  left side.  The previously placed staples  were removed.  The old  incision was opened without difficulty.  Copious amount of purulent  drainage was noted.  Cultures aerobic, anaerobic, and fungal cultures  were obtained.  Significant amount of abscess tissue was noted around  and within the left submandibular gland.  Consequently the decision was  made to remove the left submandibular gland.  It was carefully dissected  from the surrounding soft tissue.  The facial artery and the  submandibular duct were ligated and cut.  The surgical site was  subsequently irrigated with bacitracin and polymyxin solution.  A new  #10 JP drain was placed.  The incision was closed with 3-0 Vicryl and  staples.  Attention was then focused on the right side.  The old  incision was opened without difficulty.  A small amount purulent  drainage was suctioned and removed.  No devitalized tissue was noted on  the right side.  Another JP drain was placed.  The incision was closed  in layers with 3-0 Vicryl and staples.   Attention was then focused well within the oral cavity.  Careful  inspection of the alveolar ridge and the mucosal surface of the mandible  reveals no fistula formation.  The patient does have impacted third  molar bilaterally.  That concluded procedure for the patient.  The care  of the patient was turned over to the anesthesiologist.  The patient was  awakened from anesthesia without difficulty.  He was extubated and  transferred to recovery room in good condition.   OPERATIVE FINDINGS:  Bilateral neck abscess.  The left submandibular  gland was noted to be significantly devitalized and contained abscess  cavity.  The gland was removed without difficulty.   FOLLOW-UP:  The patient will be returned to the surgical floor.  He will  be continued on IV antibiotics.      Leta Baptist, MD  Electronically Signed     ST/MEDQ  D:  08/28/2007  T:  08/28/2007  Job:  [id number]

## 2011-02-28 NOTE — Discharge Summary (Signed)
[name], [name]               ACCOUNT NO.:  [id number]   MEDICAL RECORD NO.:  [id number]          PATIENT TYPE:  INP   LOCATION:  6736                         FACILITY:  [facility]   PHYSICIAN:  Thomes Lolling, M.D.    DATE OF BIRTH:  Dec 31, 1980   DATE OF ADMISSION:  12/20/2007  DATE OF DISCHARGE:  12/24/2007                               DISCHARGE SUMMARY   DISCHARGE DIAGNOSES:  1. Gastrointestinal bleed secondary to pancolitis.  2. Severe hypokalemia.  3. Polysubstance use to include cocaine and marijuana.  4. Thrombocytosis.  5. Protein calorie malnutrition.  6. Schizophrenia.  7. History of mandibular fracture with repair and subsequent infection      with abscess and drainage.  8. Prolonged QT   DISCHARGE MEDICATIONS:  1. Asacol 400 mg t.i.d.  2. Cipro 500 mg one tab b.i.d. x5 days.  3. Potassium 40 mEq one tab p.o. daily x3 days.  4. Zyprexa 5 mg wafers daily.   DISPOSITION/FOLLOWUP:  The patient is to follow up with Dr. Junius Finner at the [facility] on March  24th at 2 p.m.  At that time a CBC and a B-MET needs to be drawn.  The  patient's hemoglobin needs to be evaluated and the patient's potassium  also needs to be evaluated.   PROCEDURES PERFORMED:  No procedures were performed during this  hospitalization.   CONSULTATIONS:  Gastroenterology was consulted secondary to GI bleed.   ADMITTING HISTORY AND PHYSICAL:  [name] is a 30 year old male with  a past medical history of pancolitis and schizophrenia presenting to the  emergency room with a 1-week history of bloody stools with approximately  3-4 episodes of bloody diarrhea a day that is not associated with any  abdominal pain.  The patient also notes several episodes of nausea and  vomiting prior to admission and no episodes of hematemesis.  He denies  any chest pain, shortness of breath, fevers, chills, dysuria.  He also  notes a several day history of myalgias and  weakness.  He denies any  NSAID use.   ADMISSION VITAL SIGNS:  Temp 98.7, blood pressure 123/72, pulse 108,  respiratory rate 20, O2 sat 100% on room air.   ADMISSION PHYSICAL EXAMINATION:  GENERAL:  He is alert in no distress.  EYES:  Nonicteric.  ENT:  Dry mucous membranes.  NECK:  No lymphadenopathy.  RESPIRATORY:  Clear to auscultation bilaterally with good air entry.  CARDIOVASCULAR:  Tachycardic with a regular rhythm.  No murmurs, rubs,  or gallops.  ABDOMEN:  Soft, nondistended, mildly tender to pain in bilateral lower  quadrants and suprapubic area without any rebound or guarding.  RECTAL:  Grossly positive per the ED physician.   ADMISSION LABORATORY:  CBC:  White count 23.8 with an absolute  neutrophil count of 19.4, hemoglobin 11, MCV 74.6, platelet count 595.  PT INR 16 and 1.3, PTT 29.  C-MET:  Sodium 132, potassium 2.2, chloride  91, bicarb 31, BUN 8, creatinine 1.21, glucose 147.  Total bilirubin  0.4, alkaline phosphatase 81, AST 18, ALT 10, total protein  6.9, albumin  2.2, calcium 7.9.  Urine drug screen positive for cocaine and marijuana.  Sed rate 10.  C. diff negative.  Stool cultures negative.  Giardia and  Cryptosporidium negative.  HIV negative.  Urinalysis within normal  limits except for a protein of 30.  Blood cultures, one culture with  coag negative staph and one other blood culture negative.  Urine culture  2,000 of insignificant growth.   DIAGNOSTIC IMAGING:  CT of the abdomen and pelvis with mild diffuse  colitis and shotty nonspecific adenopathy within the small bowel,  mesentery, and transverse colon and gastrosplenic ligament.   HOSPITAL COURSE:  1. GI bleed secondary to pancolitis.  The patient has a history of a      very similar presentation and was scoped by gastroenterology two      years ago.  They felt this was likely secondary to ulcerative      colitis.  The patient was started Asacol and monitored closely in a      stepdown unit with 2  large bore IVs.  He did not require      transfusion during this hospitalization.  His hemoglobin stabilized      around 9.  The patient's white count did not improve and after      hospital day #2, his temperature increased  to 102.9.      Gastroenterology felt the patient could benefit from a colonoscopy      to further evaluate the patient's colitis and the patient declined      colonoscopy.  Because of his high fever and high white count, he      was started empirically on ciprofloxacin and began to defervesce      and white count was trending down.  The patient was also continued      on his same dose of Asacol.  During this hospitalization, his      abdominal exam was remarkably benign, considering the amount of      bleeding he reported and his elevated white count and fever.  The      patient was tolerating a full diet at discharge.  2. Hypokalemia.  The patient was noted to be markedly hypokalemic      which was felt to be secondary to his GI losses with his diarrhea,      nausea, vomiting.  The patient was replenished both intravenously      and orally and his potassium was 3.3 on the day of discharge and he      was provided potassium to take in the outpatient setting.  3. Polysubstance use.  The patient's urine drug screen was positive      for cocaine and marijuana.  The patient was counseled on stopping      these substances, particularly the cocaine because it is felt that      may contribute to problem number one.  4. Protein calorie malnutrition.  The patient's albumin was markedly      low for a 30 year old.  Nutrition was consulted and made      recommendations regarding his diet.  5. Thrombocytosis.  It was felt this was secondary to a reactive      process secondary to his illness and his platelets, continued to      trend down during this hospitalization.  6. Schizophrenia.  The patient was continued on his outpatient dose of      Zyprexa.  7. Prolonged QT.  The  pt's  QT interval was noted to be prolonged.      This resolved after his potassium was repleted.   DISCHARGE LABORATORY:  CBC:  White count 14.3, hemoglobin 8.6, MCV 73.5,  platelets 469.  B-MET:  Sodium 136, potassium 3.3, chloride 104, bicarb  27, BUN 7, creatinine 0.97, glucose 81, calcium 7.5.   DISCHARGE VITAL SIGNS:  Temperature 98.4, blood pressure 99/69, heart  rate 87, respiratory rate 22, O2 sat 100% on room air.      Junius Finner, MD  Electronically Signed      Thomes Lolling, M.D.  Electronically Signed    VW/MEDQ  D:  12/25/2007  T:  12/26/2007  Job:  [id number]   cc:   Lear Ng, MD
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
## 2011-02-28 NOTE — Consult Note (Signed)
[name], [name]               ACCOUNT NO.:  [id number]   MEDICAL RECORD NO.:  [id number]          PATIENT TYPE:  INP   LOCATION:  2924                         FACILITY:  [facility]   PHYSICIAN:  Lear Ng, MDDATE OF BIRTH:  1981-10-08   DATE OF CONSULTATION:  12/20/2007  DATE OF DISCHARGE:                                 CONSULTATION   REFERRING PHYSICIAN:  Medical Teaching Service.   We were asked to see Mr. Bordon today by the medical teaching service  in consultation for bloody diarrhea.   HISTORY OF PRESENT ILLNESS:  This is a 30 year old male with a history  of ulcerative colitis.  He reports that he has been having diarrhea for  the last week, four to five watery bowel movements per day.  He reports  seeing occasional spots of red blood in his diarrhea.  He denies any  abdominal pain, vomiting.  His appetite is excellent.   PAST MEDICAL HISTORY:  Significant for history of ulcerative colitis,  pancolitis.  Last colonoscopy was in 2006 by Dr. Knox Saliva.  Results show a universal colitis.  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   CURRENT MEDICATIONS:  Zyprexa and Cogentin.  The patient's mother is  coming to the hospital and binging the rest of his medications so that  we can list them.   ALLERGIES:  He has no known drug allergies.   SOCIAL HISTORY:  He lives at home with his mother.  He has a history of  polysubstance abuse, cocaine, alcohol and tobacco.   FAMILY HISTORY:  Negative for IBD or other GI disorders.   PHYSICAL EXAM:  He is awake.  He is pleasant to speak with, has a flat  affect.  In general, he also seems to have a rather thick tongue effect  when he speaks.  CARDIOVASCULAR:  A regular rate and rhythm.  LUNGS:  Clear to auscultation bilaterally.  ABDOMEN:  Soft, nontender, nondistended, with positive bowel sounds.   CURRENT LABS:  Potassium of 2.5 that  is being repleted, Hemoglobin is  currently 11.0.  Looking back in E-Chart over his last several visits,  it appears his baseline hemoglobin is also 11.  Hematocrit is 34.0,  white count is elevated at 23.8, platelets are 595,000.  Coagulation  studies:  He has an INR of 1.3.  PT 16, PTT 29.   ASSESSMENT:  Dr. Wilford Corner has seen and examined the patient,  collected a history.  His impression is that this is a 30 year old male  with ulcerative colitis, who now also has hypokalemia and leukocytosis.  We are concerned for possible infection in his colon because of the  leukocytosis.  Need to evaluate for toxic megacolon.  Hypokalemia is  likely due to his diarrhea.   PLAN:  We will order a CT scan to rule out toxic megacolon today, check  stool studies C. difficile, enteric pathogens, O&P.  Plan to start the  patient Asacol 1600 mg t.i.d. after a CT scan.  A clear liquid diet for  now.  We  will continue to monitor number stools as well as hematocrit a  hemoglobin and white count.  Potassium is being repleted, and we will  check a BMET.   Thanks very much for this consultation.      Melton Alar, [place]      Lear Ng, MD  Electronically Signed    MLY/MEDQ  D:  12/20/2007  T:  12/21/2007  Job:  [id number]

## 2011-03-03 NOTE — Discharge Summary (Signed)
[place]  Patient:    LEWI, DROST Visit Number: [id number] MRN: [id number]          Service Type: PSY Location: [facility] Attending Physician:  Nicholaus Bloom Dictated by:   Woodroe Chen Sabra Heck, M.D. Admit Date:  05/29/2001 Discharge Date: 06/09/2001                             Discharge Summary  CHIEF COMPLAINT AND PRESENT ILLNESS:  This was the first admission to [facility] for this 30 year old male with history of schizophrenia, who was involuntarily committed due to bizarre behavior. Commitment stated that he had not been taking his medication.  He has made statements about killing himself and others.  Has been laughing and talking to himself, running around the house in his underwear.  Bizarre behaviors such as doing pushups in the middle of the street, crawling on the floor like a dog and aggressive behavior towards his mother.  Denied any suicidal ideation at the time of evaluation.  Denies any homicidal ideation.  He said "my mother just thinks that she can evaluate me whenever she wants just to get her hands around my neck."  PAST PSYCHIATRIC HISTORY:  Followed at [facility] with Dr. Wallace Going.  First admission to [facility].  Has been at [facility] before.  ALCOHOL/DRUG HISTORY:  Has a previous history of cocaine abuse and marijuana abuse.  Not confirmed lately.  MEDICATIONS:  Geodon 20 mg twice a day.  MENTAL STATUS EXAMINATION:  Casually dressed, in bed with the covers around his head.  Cooperative with the interview.  His eyes do dart around the room during the interview as if responding to internal stimuli.  Affect is blunted. Speech is normal in tone.  No pressure.  Some speech latency.  Mood is guarded.  Thought process positive for some paranoid ideation believing his mother is against him.  His processes are somewhat bizarre with his responses somewhat bizarre.   Ideas of reference are present.  Does appear to be having some auditory hallucinations in spite of his denial.  Cognition seems to be well-preserved.  ADMITTING DIAGNOSES: Axis I:    1. Schizophrenia, paranoid-type, with acute exacerbation.            2. History of polysubstance abuse in remission. Axis II:   No diagnosis. Axis III:  Rule out allergic rhinitis. Axis IV:   Deferred. Axis V:    Global Assessment of Functioning upon admission 35; highest Global            Assessment of Functioning in the last year 23.  HOSPITAL COURSE:  He was admitted and started individual and group psychotherapy.  His Geodon was gradually increased up to 60 mg twice a day with plan to increase up to 80 mg twice a day.  He seemed to be responding to the medication well.  There were some continuation of his bizarre behavior in the initial part of the hospitalization.  Family session with the mother went reasonably well.  On July 5, was much improved.  Had tolerated the Geodon well.  No side effects.  Was willing to pursue outpatient treatment.  No suicidal ideation.  No homicidal ideation.  Has been compliant for which discharge was granted.  DISCHARGE DIAGNOSES: Axis I:    1. Schizophrenia, paranoid-type.            2. Polysubstance  abuse in remission. Axis II:   No diagnosis. Axis III:  Allergic rhinitis. Axis IV:   Moderate. Axis V:    Global Assessment of Functioning upon discharge.  DISCHARGE MEDICATIONS:  Geodon 60 mg twice a day.  FOLLOW-UP:  To be followed by [facility]. Dictated by:   Woodroe Chen Sabra Heck, M.D. Attending Physician:  Nicholaus Bloom DD:  07/10/01 TD:  07/11/01 Job: [id number]

## 2011-03-03 NOTE — Discharge Summary (Signed)
[name], [name]               ACCOUNT NO.:  [id number]   MEDICAL RECORD NO.:  [id number]          PATIENT TYPE:  INP   LOCATION:  6736                         FACILITY:  [facility]   PHYSICIAN:  Helane Rima, MD     DATE OF BIRTH:  May 21, 1981   DATE OF ADMISSION:  04/01/2005  DATE OF DISCHARGE:  04/06/2005                                 DISCHARGE SUMMARY   DISCHARGE DIAGNOSES:  1.  Pancolitis, likely ulcerative colitis.  2.  Hyperkalemia.   DISCHARGE MEDICATIONS:  1.  Asacol 1200 mg p.o. q.i.d.  2.  Depakote ER 1,000 mg q.h.s.  3.  Haldol 5 mg q.h.s.  4.  Zantac 150 mg p.o. b.i.d.  5.  Prednisone 20 mg p.o. q. a.m. x2 weeks.   FOLLOW UP:  1.  Followup with Dr. Vladimir Faster in 10 to 14 days. Call for an appointment.      Dr. Vladimir Faster is a gastroenterologist.  2.  If patient not improving consider Rowasa enemas q.h.s.  3.  Biopsy for checking for ulcerative colitis pending.  4.  Low-fiber, low-residue diet.   HOSPITAL COURSE:  Mr. [name] is a 30 year old African-American male who  came to [place] [facility] the [facility] with a 3 week history of bloody diarrhea  that was not [facility] the jail with antibiotics. On presentation, he had  signs of dehydration and hypokalemic at approximately 2.4. He is admitted  for intravenous fluids. His potassium was repleted. EKG that showed no  changes related to the hypokalemia. The patient was started on ciprofloxacin  prophylactically. Gastrointestinal was consulted. The patient subsequently  had a colonoscopy, which showed a pancolitis very suspicious for ulcerative  colitis. Biopsies were done, which were pending at time of discharge. Also,  the patient had multiple stool studies including e-coli, which was negative.  Giardia and Cryptosporidia, which were negative. Ova and parasites negative  and stool cultures that were negative. Onc diagnosis of pancolitis, the  ciprofloxacin was discontinued. The patient was started on Asacol to 100 mg  p.o. q.i.d. By day of discharge, he had  had more formed stools, still with some blood in them but with fewer number  of stools per day. Prednisone was added to his regimen on the day of  discharge, as was said above. Hypokalemia resolved well with repletion and  decrease of stool losses of potassium.       TH/MEDQ  D:  04/07/2005  T:  04/08/2005  Job:  [id number]   cc:   Raelyn Ensign. Vladimir Faster, M.D.  [address], [place]  [place] [address]  Fax: [phone]   Dr. Silvestre Mesi

## 2011-03-03 NOTE — H&P (Signed)
[place]  Patient:    [name], [name]                      MRN: [id number] Adm. Date:  [id number] Attending:  Nicholaus Bloom Dictator:   Kerrie Buffalo [name], N.P.                   Psychiatric Admission Assessment  DATE OF ADMISSION:  May 29, 2001.  IDENTIFYING INFORMATION:  This is a 30 year old African-American male who is single.  He is an involuntary commitment for bizarre behavior.  HISTORY OF THE PRESENT ILLNESS:  According to the petition, the patient told his mother yesterday that he was either going to kill himself or someone else. Mental health team workers from the PACT team arrived at the home to find that the patient was ripping up the kitchen floor "so nobody can slip."  There were 6 knives laid out on the counter and the patients behavior was somewhat bizarre.  He kept turning the oven on and off and tried to give the workers eggs when they were leaving.  Today, the patient states to me that he "will not take any stupid-ass, fakey grins or looks off anyone."  He is obviously responding to internal stimuli.  He is unable to participate in an interview effectively.  His mother reported yesterday also that he was noncompliant with his medications.  The patient himself today denies any hallucinations, denies any difficulty sleeping or problems with his appetite.  PAST PSYCHIATRIC HISTORY:  Patient is followed at [facility] by the PACT team.  Serafina Mitchell is his case worker.  Phone number is [phone], area code 336.  Patient has a history of prior inpatient admissions, multiple inpatient admissions at [facility], [facility], and [facility] in October of 2000.  Last inpatient admission was to [facility] June 29 through April 20, 2001.  Patient has been previously diagnosed with schizophrenia, paranoid type.  SOCIAL HISTORY:  Patient has his GED.  He worked at [employer] for  [employer] in 1999.  He was in prison for awhile in January of 2002 for drug-related offenses.  Patient currently lives at home with his mother and apparently his father, who is present in the home on an irregular basis.  FAMILY HISTORY:  Apparently positive for some family history of psychosis, but this is unclear.  ALCOHOL AND DRUG HISTORY:  Patient does have a past history of polysubstance abuse, including cocaine and marijuana and alcohol; however, it is unclear if he is doing any abuse recently.  In previous admission, this seemed to be in remission.  We will do urine drug screen to check this currently.  PAST MEDICAL HISTORY:  Patient has no regular medical Ahlani Wickes.  He denies any medical problems.  He has no history of any significant past medical problems. Current medications are Geodon 60 mg p.o. b.i.d. and he is apparently noncompliant with this.  DRUG ALLERGIES:  No known drug allergies.  POSITIVE PHYSICAL FINDINGS:  Patients PE is pending.  He is not able to tolerate a physical exam at this time due to his internal stimuli and labile affect and behavior.  Vital signs on admission, temp 99.9, pulse 87, respirations 16, and blood pressure 144/80.  He is a healthy-appearing black male, independent in ADLs, gait normal.  He is 5 feet 11 inches tall and weighs 189 pounds.  MENTAL STATUS EXAMINATION:  This is a  healthy-appearing African-American male who is casually dressed.  His hygiene is satisfactory.  His affect is very labile, being pleasant and cooperative, smiling one minute, then angry and swearing the next.  Speech is pressured.  He is swearing and cursing.  His mood is quite labile.  Thought process is disorganized and bizarre.  He is responding to internal stimuli.  He is not voicing any suicidal ideations at this time.  Cognitively, he is intact.  ADMISSION DIAGNOSES: Axis I:    Schizophrenia, paranoid type, acute exacerbation. Axis II:   Deferred. Axis III:  None  evident. Axis IV:   Moderate problems related to his own ability to deal with his own            mental illness and medication compliance. Axis V:    Current 35, past year 1.  INITIAL PLAN OF CARE:  Involuntarily admit the patient for his bizarre behavior, with our goal to improve his reality testing and strengthen the support systems to allow this patient to return to his own environment if that is possible.  We will get his previous record to the floor to look at his previous course of treatment.  We will give Haldol 5 mg either p.o. or IM q.6.h. p.r.n. for agitation and he will get a dose now p.o.  We are also going to give him Ativan 1 mg p.o. or IM q.6h. p.r.n. agitation with one dose now. We will resume his Geodon 60 mg p.o. b.i.d. and ask the case worker to contact his mother and assist with background information and discharge planning. Full labs on him are currently pending, including a urine drug screen.  ESTIMATED LENGTH OF STAY:  Four to  six days. DD:  05/30/01 TD:  05/30/01 Job: [id number]

## 2011-03-03 NOTE — Discharge Summary (Signed)
[place]  Patient:    [name] Visit Number: [id number] MRN: [id number]          Service Type: PSY Location: [place] Attending Physician:  [name] Dictated by:   [name], M.D. Admit Date:  05/29/2001 Discharge Date: 06/09/2001                             Discharge Summary  CHIEF COMPLAINT AND PRESENT ILLNESS:  This was one of several admissions to [facility] for this 30 year old male, single, involuntarily committed due to bizarre behavior.  Told his mother he was either going kill himself or someone else.  The _________ team people arrived and he was ripping up the kitchen floor so nobody could sleep.  There were six knives laid on the counter.  The patients behavior was bizarre.  He kept turning the oven on and off.  Tried to get the workers axe when they were leaving. Stated that he would not take any "stupid ass, fake grins or looks off anyone."  Obviously responding to internal stimuli when he was admitted. Apparently there is some compliance with the medication.  PAST PSYCHIATRIC HISTORY:  [facility].  Inpatient admissions at [facility], [facility], [facility] and [facility] more recently. Diagnosed with schizophrenia, paranoid-type.  ALCOHOL/DRUG HISTORY:  Polysubstance abuse including cocaine, marijuana and alcohol.  It is not clear if he is still using, although urine drug screen has been negative.  MENTAL STATUS EXAMINATION:  Healthy-appearing male, casually dressed, satisfactory hygiene, labile affect, being pleasant and cooperative, smiling one minute then angry and worrying the next.  Speech was pressured.  There was swearing and cursing.  His mood was labile.  Thought process disorganized and bizarre.  Responding to internal stimuli.  Not voicing any suicidal ideation. Cognition was intact.  ADMISSION DIAGNOSES: Axis I:    Schizophrenia, paranoid-type, acute  exacerbation. Axis II:   Deferred. Axis III:  No diagnosis. Axis IV:   Moderate. Axis V:    Global Assessment of Functioning upon admission 30-35; highest            Global Assessment of Functioning in the last year 80.  HOSPITAL COURSE:  He was admitted and started in intensive individual and group psychotherapy working towards improving reality testing.  He was not willing to pursue the Geodon and both his mother and mental health wanted him to switch to Zyprexa.  We went ahead and discontinued the Geodon and started the Zyprexa.  It was increased up to 10 mg in the morning and 20 mg at bedtime.  He definitely responded better to the Zyprexa than he did to the [place].  He started calming down.  There was one episode where he lost control when he was asked to have a family session with the mother but, after that, he continued to settle down.  We had family session that went pretty well.  On June 09, 2001, he had obtained full benefits.  No active delusions.  No active hallucinations.  Affect was brighter.  Seemed to be fully in touch with reality.  No suicidal ideation.  No homicidal ideation.  Discharge was considered and granted.  DISCHARGE DIAGNOSES: Axis I:    Schizophrenia, paranoid-type. Axis II:   No diagnosis. Axis III:  No diagnosis. Axis IV:   Moderate. Axis V:    Global Assessment of Functioning upon discharge 55-60.  DISCHARGE MEDICATIONS: 1. Zyprexa 15 mg, 2 at bedtime. 2. Cogentin 1 mg twice a day.  FOLLOW-UP:  [facility]. Dictated by:   [name], M.D. Attending Physician:  [name] DD:  07/10/01 TD:  07/11/01 Job: [id number]

## 2011-03-03 NOTE — Consult Note (Signed)
NAME:  [name], [name]            ACCOUNT NO.:  [id number]   MEDICAL RECORD NO.:  [id number]          PATIENT TYPE:  INP   LOCATION:  6736                         FACILITY:  [place]   PHYSICIAN:  Raelyn Ensign. Santogade, M.D.DATE OF BIRTH:  02-02-1981   DATE OF CONSULTATION:  DATE OF DISCHARGE:                                   CONSULTATION   REFERRING PHYSICIAN:  Billey Chang, M.D.   HISTORY OF PRESENT ILLNESS:  Mr. Leeson is a 30 year old male currently  incarcerated, but brought to the emergency room with at least three weeks of  diarrhea, which initially was clear and then became bloody, with lower  abdominal pain mostly on the left side.  He has had some nausea and vomiting  as well.  He was found to be orthostatic and hypokalemic on admission.  He  was initially treated with Flagyl when in the prison.  This was then  switched to Septra and in the hospital he has been receiving Cipro.  However, he denies the use of antibiotics prior to the onset of the  diarrhea.  He has no prior history of prolonged diarrhea or other  gastrointestinal problems, except for reflux disease.  Evaluation to date  has included stool that is guaiac positive that has white cells present.  Cultures so far are negative x 2.  Clostridium difficile assays are negative  x 2.  Assays for Giardia and Cryptosporidium were also negative.  The  patient is reportedly HIV negative.   PAST MEDICAL HISTORY:  Pertinent for psychiatric disturbance treated with  Depakote and Haldol.   CURRENT MEDICATIONS:  Depakote, Haldol, Cipro and Xanax, which in the  hospital has been switched to Pepcid.   ALLERGIES:  None reported.   FAMILY HISTORY:  Negative for any inflammatory bowel disease or other  gastrointestinal disorders.   SOCIAL HISTORY:  Smokes one packs of cigarettes per day.  Used to drink two  24-ounce beers prior to incarceration.  He also has a history of prior  cocaine use.   REVIEW OF SYSTEMS:  GENERAL:  No  weight loss or night sweats.  ENDOCRINE:  No known diabetes or thyroid problems.  SKIN:  No rash or pruritus.  EYES:  No icterus or change in vision.  EARS, NOSE AND THROAT:  No aphthous ulcers  or chronic sore throat.  RESPIRATORY:  No shortness of breath, cough or  wheezing.  CARDIAC:  No chest pain, palpitations or history of valvular  heart disease.  GASTROINTESTINAL:  As above.  GENITOURINARY:  No dysuria or  hematuria.  The remainder of the review of systems is negative.   PHYSICAL EXAMINATION:  GENERAL APPEARANCE:  He is a young adult male in no  acute distress.  VITAL SIGNS:  Afebrile.  Blood pressure 106/69.  The pulse is 90.  SKIN:  Normal.  HEENT:  Eyes:  Anicteric.  Conjunctivae pink.  Oropharynx unremarkable  without aphthous ulcers.  NECK:  Supple without thyromegaly.  LYMPHATICS:  There is no cervical or inguinal adenopathy.  CHEST:  Sounds clear.  HEART:  Heart sounds with regular rate and rhythm without murmurs.  ABDOMEN:  Soft with active bowel sounds.  There is mild tenderness in the  left lower quadrant.  RECTAL:  Exam not performed.  EXTREMITIES:  Without edema.   ADDITIONAL LABORATORY TESTS:  Hemoglobin 12.1, white blood count 11.1, down  from admission of 13.9, platelet count normal.  Sodium 132, potassium 3.4,  BUN 1.   IMPRESSION:  Prolonged diarrheal episode, which is bloody, in a young adult  male and does not seem to be responding to antibiotics.  He reports having  had at least six blood BMs already today, rule out inflammatory bowel  disease.   PLAN:  Sigmoidoscopy with extension to full colonoscopy if needed.  He is  scheduled for tomorrow afternoon.  The procedure is reviewed with the  patient in terms of technique, preparation and risks of complications,  including bleeding and perforation.  He agrees to proceed.  Please see the  orders.       PJS/MEDQ  D:  04/03/2005  T:  04/03/2005  Job:  [id number]

## 2011-03-03 NOTE — H&P (Signed)
[place]  Patient:    [name], [name]                      MRN: [id number] Adm. Date:  [id number] Attending:  Nicholaus Bloom Dictator:   Kerrie Buffalo [name], N.P.                   Psychiatric Admission Assessment  DATE OF ASSESSMENT:  April 13, 2001, at 10:40 a.m.  IDENTIFYING INFORMATION:  This is a 30 year old African-American male who is single, involuntary admission for bizarre behavior.  HISTORY OF PRESENT ILLNESS:  This patient with a history of paranoid schizophrenia was petitioned by his mother for involuntary commitment.  The patient commitment states that the patient has not been taking his medications for the past week.  He made statements about killing himself and others, has been laughing and talking to himself, and running around the house in his underwear.  The mother also reports bizarre behavior, such as doing push-ups in the middle of the street, crawling on the floor like a dog, and aggressive behavior towards his mother.  Today he denies any suicidal ideation or homicidal ideation.  He denies any auditory or visual hallucinations, although his behavior would seem to reveal otherwise.  He denies any sleep or appetite problems, but he does state "my mother just thinks that she can evaluate me whenever she wants, just to get her hands around my neck."  PAST PSYCHIATRIC HISTORY:  The patient has been followed by [facility] by Dr. Wallace Brennan and a psychotherapist.  This is his first admission to [facility].  He has been previously hospitalized at [facility] and at [facility] in October 2000.  SOCIAL HISTORY:  The patient has his GED.  He last worked in 1999, at [employer].  He was released from prison in January 2002, after being imprisoned for drug charges.  He currently lives at home with his mother and, he states, his father, who comes and goes and is in the home  irregularly.  ALCOHOL AND DRUG HISTORY:  The patient, apparently, has a history of cocaine abuse and marijuana abuse, which is not confirmed at this time.  This is, apparently, in remission.  Apparently, his mother told assessment workers that he has not been abusing drugs.  Today he denies any abuse at this time.  He also denies any abuse of alcohol.  PAST MEDICAL HISTORY:  The patient states he has no regular medical care [name].  The only medical complaint at this time is a stuffy nose.  Denies any other medical problems.  MEDICATIONS:  Geodon 20 mg p.o. b.i.d.  ALLERGIES:  No known drug allergies.  PHYSICAL EXAMINATION:  The patients PE is pending.  He is a bit too psychotic right now to tolerate a full physical.  However, on admission his temperature is 99, pulse 87, respirations 22.  He is about 5 feet 9 inches tall, weighs 185 pounds.  His blood pressure was 132/67.  His thyroid screen was within normal limits.  Cardiovascular:  S1 and S2 were heard.  Regular rate.  No clicks, murmurs, or gallops.  The patients lungs are clear to auscultation. He does have some nasal stuffiness.  MENTAL STATUS EXAMINATION:  This is a casually-dressed patient who is in bed with the covers up around his head.  He cooperates with the interview.  His eyes do dart around the room during the interview as  if responding to internal stimuli.  Affect is blunted.  Speech is normal in tone, no pressure, some speech latency.  Mood is definitely guarded.  Thought processes positive for some paranoid ideation, believing his mother is against him.  His processing is somewhat bizarre with his responses somewhat bizarre.  Ideas of reference are present.  He does appear to be having some auditory hallucinations in spite of his denial.  He denies that he is having any conflicts or fears on the unit at this time.  Cognitively he is oriented x 3 and is intact.  DIAGNOSES: Axis I:    1. Schizophrenia, paranoid  type, with acute exacerbation.            2. History of polysubstance abuse, currently in remission. Axis II:   Antisocial personality disorder by history. Axis III:  Rule out allergic rhinitis. Axis IV:   Deferred. Axis V:    Current 35, past year 42.  PLAN:  Admit the patient to stabilize his thinking.  Every 15 minute checks are in place.  We will start Geodon 20 mg p.o. ______ as he was previously taking it and monitor his response to that.  We are also giving him Haldol 2 mg p.o. or IM p.r.n. for agitation, and Ativan 0.5 mg p.o. or IM for agitation q.4h.  We will also give him some Claritin with probably an allergic rhinitis, and we will monitor him and his behavior and adjust medications accordingly. Estimated length of stay is three to five days.  Goal is to improve his thinking and reduce his internal stimuli. DD:  04/13/01 TD:  04/13/01 Job: [id number]

## 2011-03-03 NOTE — H&P (Signed)
NAME:  KELEN, LAURA NO.:  [id number]   MEDICAL RECORD NO.:  [id number]          PATIENT TYPE:  INP   LOCATION:  6736                         FACILITY:  [place]   PHYSICIAN:  Billey Chang, M.D.     DATE OF BIRTH:  01/08/1981   DATE OF ADMISSION:  04/01/2005  DATE OF DISCHARGE:                                HISTORY & PHYSICAL   CHIEF COMPLAINT:  Bloody diarrhea and hypokalemia.   HISTORY OF PRESENT ILLNESS:  This patient is a 30 year old male, currently  incarcerated, brought to the ED over concerns of gross bloody diarrhea.  He  has been complaining of three weeks of bloody diarrhea with lower abdominal  pain.  He had some nausea and vomiting over the last several days.  He was  orthostatic at the prison.  He was started on Flagyl on March 25, 2005 and  then switched to Septra.  According to the prison M.D. and R.N., the patient  had diarrhea for three weeks which was nonbloody and became bloody after  having been started on the Flagyl.  At that time, he had been changed to the  Septra.  No other inmates have had the same illness.  He complains of some  mild epigastric/chest pain over the last two days that is improving.  He  does have a history of GERD.  While in the ED, the patient continued to have  diarrhea.  Subsequently, he was found to have a potassium of 2.4 and  therefore, admitted.   PAST MEDICAL HISTORY:  Essentially negative with most likely some GERD based  on his medication list.  According to the patient, he is HIV negative and  was tested approximately three days ago.   SURGICAL HISTORY:  Only significant for repair of a broken nose.   FAMILY HISTORY:  Mother is 64 with hypertension.  His Dad is 39, healthy.  A  brother who is who is healthy.   SOCIAL HISTORY:  He is a cigarette smoker, 10-20 cigarettes per day since he  was 30 years old.  He did use ethanol prior to incarceration, approximately  two 24-ounce of [place] per day.   Additionally, he admits to cocaine use,  last time approximately three months ago which he smoked, and he said he did  have withdrawal symptoms when he stopped using it.   REVIEW OF SYSTEMS:  Positive for bloody diarrhea, fevers, chills, some pain  in the chest which is improving and as mentioned above.  No urinary  complaints.  No swelling in the lower extremities.   ALLERGIES:  No known drug allergies.   MEDICATIONS:  1.  Depakote 1,000 mg q.h.s.  2.  Haldol 5 mg q.h.s.  3.  Zantac 150 mg p.o. b.i.d.  4.  Septra which was started on March 31, 2005.  5.  Flagyl which he had used from March 25, 2005 to March 31, 2005.   PHYSICAL EXAMINATION:  VITAL SIGNS:  Temp 99.0, heart rate 97 to 113, blood  pressure 125/92, 96% on room air.  Orthostatic vital signs:  Supine: blood  pressure 120/80, pulse 97; sitting: blood  pressure 111/73, pulse 110;  standing: blood pressure 114/65, pulse 136.  GENERAL:  No acute distress.  Lying in bed.  Alert and oriented x 3.  HEENT:  Normocephalic, atraumatic.  Extraocular muscles intact.  Pupils  equal, round, reactive to light.  Oral mucosa pink and moist.  Mild  pharyngeal erythema.  NECK:  Supple.  No JVD.  No lymphadenopathy.  CARDIOVASCULAR:  S1 S2.  Regular rate and rhythm.  No murmurs, rubs, or  gallops.  LUNGS:  Clear to auscultation.  No wheezes, rales, rhonchi with equal  movement bilaterally.  ABDOMEN:  Positive bowel sounds, soft.  Mild tenderness to palpation of the  lower abdomen.  RECTAL:  No obvious fissures or external hemorrhoids.  Positive blood on Q-  Tip per EDP.  EXTREMITIES:  Pulses +2.  Warm.  Less than two second capillary refill.   LABS:  White count 13.9, hemoglobin 14.7, hematocrit 43, platelets 377,  neutrophils 50, lymphocytes 17, monocytes 23.  Sodium 133, potassium 2.4,  chloride 94, CO2 35, BUN 9, creatinine 1.3, glucose 104.  Magnesium level of  2.1.  Fecal occult blood positive.  Abdominal x-ray showed no acute  disease.  EKG was normal sinus rhythm with no signs of ectopy.  UA and stool studies  were pending.   ASSESSMENT:  A 30 year old African American male with bloody diarrhea and  hypokalemia.   PLAN:  1.  Diarrhea.  We will get stool studies, start IV fluids, start on a clear      liquid diet and advance as tolerated.  It is likely a gastroenteritis.      We will start him on Cipro 500 mg p.o. b.i.d. for prophylaxis until      stool studies return.  If persists, he may need a GI involvement for      colonoscopy.  2.  Hypokalemia, likely secondary to GI loss.  We will replete.  Place on      tele to monitor cardiac changes.  Recheck BMP this evening.  3.  We will also continue on previous meds, per prison M.D.  4.  Anticipate DC in 1-2 days.       TH/MEDQ  D:  04/01/2005  T:  04/01/2005  Job:  [id number]

## 2011-07-10 LAB — ABO/RH: ABO/RH(D): A POS

## 2011-07-10 LAB — COMPREHENSIVE METABOLIC PANEL
ALT: 18
AST: 18
Albumin: 2.2 — ABNORMAL LOW
Albumin: 2.1 — ABNORMAL LOW
Alkaline Phosphatase: 81
Alkaline Phosphatase: 59
Calcium: 8.1 — ABNORMAL LOW
Chloride: 91 — ABNORMAL LOW
Creatinine, Ser: 1.21
GFR calc Af Amer: >60
GFR calc Af Amer: >60
Glucose, Bld: 119 — ABNORMAL HIGH
Potassium: 2.2 — CL
Potassium: 3.6
Sodium: 132 — ABNORMAL LOW
Sodium: 138
Total Bilirubin: 0.4
Total Protein: 6.4

## 2011-07-10 LAB — MAGNESIUM
Magnesium: 2.2
Magnesium: 2.3

## 2011-07-10 LAB — TYPE AND SCREEN: Antibody Screen: NEGATIVE

## 2011-07-10 LAB — BASIC METABOLIC PANEL
BUN: 2 — ABNORMAL LOW
BUN: 6
BUN: 7
CO2: 24
CO2: 27
CO2: 27
CO2: 28
CO2: 28
Calcium: 6.8 — ABNORMAL LOW
Calcium: 7.2 — ABNORMAL LOW
Calcium: 7.4 — ABNORMAL LOW
Chloride: 104
Chloride: 107
Chloride: 108
Chloride: 108
Creatinine, Ser: 1.03
Creatinine, Ser: 1.05
GFR calc Af Amer: >60
GFR calc Af Amer: >60
GFR calc Af Amer: >60
GFR calc Af Amer: >60
GFR calc non Af Amer: >60
GFR calc non Af Amer: >60
Glucose, Bld: 75
Glucose, Bld: 81
Glucose, Bld: 82
Glucose, Bld: 90
Potassium: 3.2 — ABNORMAL LOW
Potassium: 3.3 — ABNORMAL LOW
Potassium: 3.4 — ABNORMAL LOW
Potassium: 3.9
Potassium: 3.7
Sodium: 136
Sodium: 136
Sodium: 141

## 2011-07-10 LAB — CBC
HCT: 25.9 — ABNORMAL LOW
HCT: 26 — ABNORMAL LOW
HCT: 26.2 — ABNORMAL LOW
HCT: 28.8 — ABNORMAL LOW
HCT: 29 — ABNORMAL LOW
HCT: 29.3 — ABNORMAL LOW
HCT: 29.5 — ABNORMAL LOW
HCT: 29.6 — ABNORMAL LOW
HCT: 25.8 — ABNORMAL LOW
Hemoglobin: 10.1 — ABNORMAL LOW
Hemoglobin: 8.5 — ABNORMAL LOW
Hemoglobin: 8.5 — ABNORMAL LOW
Hemoglobin: 8.6 — ABNORMAL LOW
Hemoglobin: 8.7 — ABNORMAL LOW
Hemoglobin: 8.8 — ABNORMAL LOW
Hemoglobin: 9.5 — ABNORMAL LOW
Hemoglobin: 9.5 — ABNORMAL LOW
Hemoglobin: 9.6 — ABNORMAL LOW
MCHC: 32.5
MCHC: 32.9
MCHC: 32.9
MCHC: 33
MCHC: 33
MCHC: 33.1
MCHC: 33.3
MCHC: 33.4
MCHC: 33.2
MCV: 72.9 — ABNORMAL LOW
MCV: 73.1 — ABNORMAL LOW
MCV: 73.2 — ABNORMAL LOW
MCV: 73.5 — ABNORMAL LOW
MCV: 73.5 — ABNORMAL LOW
MCV: 74 — ABNORMAL LOW
MCV: 74.2 — ABNORMAL LOW
Platelets: 469 — ABNORMAL HIGH
Platelets: 508 — ABNORMAL HIGH
Platelets: 515 — ABNORMAL HIGH
Platelets: 527 — ABNORMAL HIGH
Platelets: 540 — ABNORMAL HIGH
Platelets: 548 — ABNORMAL HIGH
Platelets: 551 — ABNORMAL HIGH
Platelets: 555 — ABNORMAL HIGH
Platelets: 595 — ABNORMAL HIGH
Platelets: 660 — ABNORMAL HIGH
Platelets: 683 — ABNORMAL HIGH
RBC: 3.52 — ABNORMAL LOW
RBC: 3.53 — ABNORMAL LOW
RBC: 3.63 — ABNORMAL LOW
RBC: 4 — ABNORMAL LOW
RBC: 4 — ABNORMAL LOW
RBC: 4.18 — ABNORMAL LOW
RDW: 17.6 — ABNORMAL HIGH
RDW: 17.6 — ABNORMAL HIGH
RDW: 17.8 — ABNORMAL HIGH
RDW: 17.8 — ABNORMAL HIGH
RDW: 18.1 — ABNORMAL HIGH
RDW: 18.2 — ABNORMAL HIGH
RDW: 18.7 — ABNORMAL HIGH
RDW: 19 — ABNORMAL HIGH
RDW: 19.2 — ABNORMAL HIGH
WBC: 18.5 — ABNORMAL HIGH
WBC: 19.3 — ABNORMAL HIGH
WBC: 20 — ABNORMAL HIGH
WBC: 21 — ABNORMAL HIGH
WBC: 21 — ABNORMAL HIGH
WBC: 22 — ABNORMAL HIGH
WBC: 22.4 — ABNORMAL HIGH
WBC: 23.8 — ABNORMAL HIGH

## 2011-07-10 LAB — RAPID URINE DRUG SCREEN, HOSP PERFORMED
Barbiturates: NOT DETECTED
Benzodiazepines: NOT DETECTED
Benzodiazepines: NOT DETECTED
Cocaine: POSITIVE — AB
Opiates: NOT DETECTED

## 2011-07-10 LAB — OCCULT BLOOD X 1 CARD TO LAB, STOOL
Fecal Occult Bld: POSITIVE
Fecal Occult Bld: POSITIVE

## 2011-07-10 LAB — STOOL CULTURE

## 2011-07-10 LAB — DIFFERENTIAL
Basophils Absolute: 0.1
Basophils Absolute: 0.1
Basophils Relative: 0
Eosinophils Absolute: 0.4
Eosinophils Absolute: 0.1
Eosinophils Relative: 2
Eosinophils Relative: 2
Eosinophils Relative: 1
Lymphocytes Relative: 6 — ABNORMAL LOW
Lymphs Abs: 1.7
Monocytes Absolute: 2 — ABNORMAL HIGH
Monocytes Absolute: 2.3 — ABNORMAL HIGH
Monocytes Absolute: 2.5 — ABNORMAL HIGH
Monocytes Absolute: 0.8
Monocytes Relative: 9
Neutro Abs: 17.1 — ABNORMAL HIGH
Neutro Abs: 8.6 — ABNORMAL HIGH
Neutrophils Relative %: 81 — ABNORMAL HIGH
Neutrophils Relative %: 79 — ABNORMAL HIGH

## 2011-07-10 LAB — URINALYSIS, ROUTINE W REFLEX MICROSCOPIC
Bilirubin Urine: NEGATIVE
Bilirubin Urine: NEGATIVE
Glucose, UA: NEGATIVE
Hgb urine dipstick: NEGATIVE
Ketones, ur: NEGATIVE
Leukocytes, UA: NEGATIVE
Nitrite: NEGATIVE
Protein, ur: 30 — AB
Specific Gravity, Urine: 1.02

## 2011-07-10 LAB — IRON AND TIBC
Iron: 14 — ABNORMAL LOW
Saturation Ratios: 6 — ABNORMAL LOW

## 2011-07-10 LAB — URINE CULTURE: Special Requests: NEGATIVE

## 2011-07-10 LAB — I-STAT 8, (EC8 V) (CONVERTED LAB)
Acid-Base Excess: 7 — ABNORMAL HIGH
Bicarbonate: 31.3 — ABNORMAL HIGH
Hemoglobin: 13.3
Potassium: 2.5 — CL
Sodium: 132 — ABNORMAL LOW
TCO2: 33
pH, Ven: 7.472 — ABNORMAL HIGH

## 2011-07-10 LAB — GIARDIA/CRYPTOSPORIDIUM SCREEN(EIA): Giardia Screen - EIA: NEGATIVE

## 2011-07-10 LAB — CULTURE, BLOOD (ROUTINE X 2): Culture: NO GROWTH

## 2011-07-10 LAB — HEMOGLOBIN A1C
Hgb A1c MFr Bld: 6
Mean Plasma Glucose: 136

## 2011-07-10 LAB — APTT: aPTT: 29

## 2011-07-10 LAB — CLOSTRIDIUM DIFFICILE EIA

## 2011-07-10 LAB — RETICULOCYTES: Retic Ct Pct: 2.2

## 2011-07-10 LAB — LIPASE, BLOOD: Lipase: 24

## 2011-07-10 LAB — VITAMIN B12: Vitamin B-12: 524 (ref 211–911)

## 2011-07-25 LAB — CULTURE, ROUTINE-ABSCESS: Culture: NO GROWTH

## 2011-07-25 LAB — CBC
HCT: 31.7 — ABNORMAL LOW
HCT: 32.4 — ABNORMAL LOW
HCT: 34.1 — ABNORMAL LOW
HCT: 36 — ABNORMAL LOW
Hemoglobin: 10.6 — ABNORMAL LOW
Hemoglobin: 11.8 — ABNORMAL LOW
MCHC: 32.4
MCHC: 32.7
MCHC: 32.8
MCV: 74 — ABNORMAL LOW
MCV: 74.4 — ABNORMAL LOW
MCV: 74.8 — ABNORMAL LOW
Platelets: 476 — ABNORMAL HIGH
Platelets: 514 — ABNORMAL HIGH
RBC: 4.26
RBC: 4.38
RBC: 4.53
RDW: 18.8 — ABNORMAL HIGH
RDW: 19.1 — ABNORMAL HIGH
WBC: 11 — ABNORMAL HIGH
WBC: 13.9 — ABNORMAL HIGH

## 2011-07-25 LAB — DIFFERENTIAL
Basophils Relative: 0
Monocytes Absolute: 0.1
Monocytes Relative: 1 — ABNORMAL LOW
Neutro Abs: 13.6 — ABNORMAL HIGH

## 2011-07-25 LAB — BASIC METABOLIC PANEL
CO2: 27
Chloride: 100
Creatinine, Ser: 1.2
GFR calc Af Amer: >60

## 2011-07-25 LAB — ANAEROBIC CULTURE

## 2011-07-25 LAB — CULTURE, BLOOD (ROUTINE X 2): Culture: NO GROWTH

## 2011-07-26 LAB — BUN: BUN: 12

## 2011-07-27 LAB — DIFFERENTIAL
Basophils Absolute: 0
Basophils Relative: 0
Eosinophils Absolute: 0.2
Eosinophils Relative: 1
Lymphs Abs: 1.2

## 2011-07-27 LAB — CULTURE, BLOOD (ROUTINE X 2)

## 2011-07-27 LAB — CBC
HCT: 32.7 — ABNORMAL LOW
MCHC: 32.5
MCV: 82.3
Platelets: 639 — ABNORMAL HIGH
RDW: 17.5 — ABNORMAL HIGH

## 2011-07-28 LAB — BASIC METABOLIC PANEL WITH GFR
BUN: 4 — ABNORMAL LOW
CO2: 29
Calcium: 9.3
Chloride: 102
Creatinine, Ser: 0.79
GFR calc non Af Amer: >60
Glucose, Bld: 108 — ABNORMAL HIGH
Potassium: 3.8
Sodium: 137

## 2011-07-28 LAB — DIFFERENTIAL
Basophils Absolute: 0
Basophils Relative: 0
Eosinophils Absolute: 0
Eosinophils Relative: 0
Lymphocytes Relative: 6 — ABNORMAL LOW
Lymphs Abs: 1.2
Monocytes Absolute: 1.8 — ABNORMAL HIGH
Monocytes Relative: 10
Neutro Abs: 15.3 — ABNORMAL HIGH
Neutrophils Relative %: 83 — ABNORMAL HIGH

## 2011-07-28 LAB — BASIC METABOLIC PANEL
BUN: 5 — ABNORMAL LOW
GFR calc non Af Amer: >60
Potassium: 3.3 — ABNORMAL LOW

## 2011-07-28 LAB — CBC
HCT: 33.8 — ABNORMAL LOW
HCT: 35.5 — ABNORMAL LOW
Hemoglobin: 11.1 — ABNORMAL LOW
Platelets: 594 — ABNORMAL HIGH
WBC: 18.4 — ABNORMAL HIGH
WBC: 7.5

## 2011-07-28 LAB — PROTIME-INR
INR: 1.3
Prothrombin Time: 16 — ABNORMAL HIGH

## 2011-07-28 LAB — ETHANOL: Alcohol, Ethyl (B): <5

## 2011-07-31 LAB — BASIC METABOLIC PANEL
BUN: 9
Chloride: 91 — ABNORMAL LOW
Glucose, Bld: 200 — ABNORMAL HIGH
Potassium: 2.5 — CL

## 2011-07-31 LAB — CBC
HCT: 44
MCV: 86.8
Platelets: 458 — ABNORMAL HIGH
RDW: 14.1 — ABNORMAL HIGH
WBC: 17.5 — ABNORMAL HIGH

## 2011-07-31 LAB — VALPROIC ACID LEVEL: Valproic Acid Lvl: 28.3 — ABNORMAL LOW

## 2011-07-31 LAB — DIFFERENTIAL
Eosinophils Absolute: 0.2
Eosinophils Relative: 1
Lymphs Abs: 1.1
Monocytes Absolute: 2.8 — ABNORMAL HIGH

## 2011-11-30 ENCOUNTER — Encounter (HOSPITAL_COMMUNITY): Payer: Self-pay | Admitting: Emergency Medicine

## 2011-11-30 ENCOUNTER — Emergency Department (HOSPITAL_COMMUNITY)
Admission: EM | Admit: 2011-11-30 | Discharge: 2011-11-30 | Discharge status: Against Medical Advice | Payer: Self-pay | Attending: Emergency Medicine | Admitting: Emergency Medicine

## 2011-11-30 DIAGNOSIS — K5190 Ulcerative colitis, unspecified, without complications: Secondary | ICD-10-CM | POA: Insufficient documentation

## 2011-11-30 DIAGNOSIS — R1110 Vomiting, unspecified: Secondary | ICD-10-CM | POA: Insufficient documentation

## 2011-11-30 NOTE — ED Notes (Signed)
Pt signed AMA paperwork

## 2011-11-30 NOTE — ED Notes (Signed)
Pt c/o vomiting x 2 days; pt sts hx of ulcerative colitis in past

## 2011-12-01 ENCOUNTER — Inpatient Hospital Stay (HOSPITAL_COMMUNITY)
Admission: EM | Admit: 2011-12-01 | Discharge: 2011-12-18 | DRG: 387 | Disposition: A | Discharge status: Home / Self Care | Payer: Medicare Other | Source: Ambulatory Visit | Attending: Internal Medicine | Admitting: Internal Medicine

## 2011-12-01 ENCOUNTER — Emergency Department (HOSPITAL_COMMUNITY)
Admission: EM | Admit: 2011-12-01 | Discharge: 2011-12-01 | Disposition: A | Discharge status: Home / Self Care | Payer: Medicare Other | Attending: Emergency Medicine | Admitting: Emergency Medicine

## 2011-12-01 ENCOUNTER — Encounter (HOSPITAL_COMMUNITY): Payer: Self-pay | Admitting: Emergency Medicine

## 2011-12-01 ENCOUNTER — Emergency Department (HOSPITAL_COMMUNITY): Payer: Medicare Other

## 2011-12-01 ENCOUNTER — Encounter (HOSPITAL_COMMUNITY): Payer: Self-pay | Admitting: *Deleted

## 2011-12-01 DIAGNOSIS — E876 Hypokalemia: Secondary | ICD-10-CM | POA: Insufficient documentation

## 2011-12-01 DIAGNOSIS — Z79899 Other long term (current) drug therapy: Secondary | ICD-10-CM

## 2011-12-01 DIAGNOSIS — R112 Nausea with vomiting, unspecified: Secondary | ICD-10-CM | POA: Insufficient documentation

## 2011-12-01 DIAGNOSIS — D72829 Elevated white blood cell count, unspecified: Secondary | ICD-10-CM

## 2011-12-01 DIAGNOSIS — F209 Schizophrenia, unspecified: Secondary | ICD-10-CM | POA: Diagnosis present

## 2011-12-01 DIAGNOSIS — R109 Unspecified abdominal pain: Secondary | ICD-10-CM | POA: Insufficient documentation

## 2011-12-01 DIAGNOSIS — R6883 Chills (without fever): Secondary | ICD-10-CM | POA: Insufficient documentation

## 2011-12-01 DIAGNOSIS — R1110 Vomiting, unspecified: Secondary | ICD-10-CM

## 2011-12-01 DIAGNOSIS — K529 Noninfective gastroenteritis and colitis, unspecified: Secondary | ICD-10-CM

## 2011-12-01 DIAGNOSIS — K5190 Ulcerative colitis, unspecified, without complications: Principal | ICD-10-CM

## 2011-12-01 DIAGNOSIS — F200 Paranoid schizophrenia: Secondary | ICD-10-CM | POA: Diagnosis present

## 2011-12-01 DIAGNOSIS — R197 Diarrhea, unspecified: Secondary | ICD-10-CM

## 2011-12-01 DIAGNOSIS — K648 Other hemorrhoids: Secondary | ICD-10-CM | POA: Diagnosis present

## 2011-12-01 DIAGNOSIS — R1084 Generalized abdominal pain: Secondary | ICD-10-CM

## 2011-12-01 HISTORY — DX: Schizophrenia, unspecified: F20.9

## 2011-12-01 LAB — COMPREHENSIVE METABOLIC PANEL
Albumin: 4.2 g/dL (ref 3.5–5.2)
Albumin: 3.9 g/dL (ref 3.5–5.2)
BUN: 15 mg/dL (ref 6–23)
BUN: 14 mg/dL (ref 6–23)
Calcium: 10 mg/dL (ref 8.4–10.5)
Calcium: 9.5 mg/dL (ref 8.4–10.5)
Creatinine, Ser: 0.97 mg/dL (ref 0.50–1.35)
Creatinine, Ser: 0.92 mg/dL (ref 0.50–1.35)
GFR calc Af Amer: >90 mL/min (ref >90)
GFR calc Af Amer: >90 mL/min (ref >90)
Glucose, Bld: 122 mg/dL — ABNORMAL HIGH (ref 70–99)
Potassium: 3.2 mEq/L — ABNORMAL LOW (ref 3.5–5.1)
Potassium: 3.4 mEq/L — ABNORMAL LOW (ref 3.5–5.1)
Total Protein: 8.4 g/dL — ABNORMAL HIGH (ref 6.0–8.3)
Total Protein: 7.5 g/dL (ref 6.0–8.3)

## 2011-12-01 LAB — DIFFERENTIAL
Basophils Relative: 0 % (ref 0–1)
Basophils Relative: 0 % (ref 0–1)
Eosinophils Absolute: 1.5 10*3/uL — ABNORMAL HIGH (ref 0.0–0.7)
Eosinophils Absolute: 1 10*3/uL — ABNORMAL HIGH (ref 0.0–0.7)
Eosinophils Relative: 10 % — ABNORMAL HIGH (ref 0–5)
Eosinophils Relative: 7 % — ABNORMAL HIGH (ref 0–5)
Monocytes Absolute: 1.9 10*3/uL — ABNORMAL HIGH (ref 0.1–1.0)
Monocytes Absolute: 1.8 10*3/uL — ABNORMAL HIGH (ref 0.1–1.0)
Monocytes Relative: 13 % — ABNORMAL HIGH (ref 3–12)
Monocytes Relative: 13 % — ABNORMAL HIGH (ref 3–12)
Neutrophils Relative %: 63 % (ref 43–77)
Neutrophils Relative %: 71 % (ref 43–77)

## 2011-12-01 LAB — CBC
Hemoglobin: 15.7 g/dL (ref 13.0–17.0)
Hemoglobin: 14.2 g/dL (ref 13.0–17.0)
MCH: 29 pg (ref 26.0–34.0)
MCH: 28.6 pg (ref 26.0–34.0)
MCHC: 34.7 g/dL (ref 30.0–36.0)
MCHC: 34 g/dL (ref 30.0–36.0)
MCV: 83.4 fL (ref 78.0–100.0)
MCV: 84.1 fL (ref 78.0–100.0)

## 2011-12-01 LAB — LIPASE, BLOOD
Lipase: 22 U/L (ref 11–59)
Lipase: 34 U/L (ref 11–59)

## 2011-12-01 MED ORDER — ONDANSETRON HCL 4 MG/2ML IJ SOLN
4.0000 mg | Freq: Once | INTRAMUSCULAR | Status: AC
  Administered 2011-12-01: 4 mg via INTRAVENOUS
  Filled 2011-12-01: qty 2

## 2011-12-01 MED ORDER — POTASSIUM CHLORIDE 20 MEQ/15ML (10%) PO LIQD
40.0000 meq | Freq: Once | ORAL | Status: AC
  Administered 2011-12-01: 40 meq via ORAL
  Filled 2011-12-01: qty 30

## 2011-12-01 MED ORDER — ACETAMINOPHEN 650 MG RE SUPP: 650.0000 mg | Freq: Four times a day (QID) | RECTAL | Status: DC | PRN

## 2011-12-01 MED ORDER — ONDANSETRON HCL 4 MG/2ML IJ SOLN
4.0000 mg | Freq: Once | INTRAMUSCULAR | Status: AC
  Administered 2011-12-01: 4 mg via INTRAVENOUS

## 2011-12-01 MED ORDER — SODIUM CHLORIDE 0.9 % IV BOLUS (SEPSIS)
1000.0000 mL | Freq: Once | INTRAVENOUS | Status: AC
  Administered 2011-12-01: 1000 mL via INTRAVENOUS

## 2011-12-01 MED ORDER — ONDANSETRON HCL 4 MG/2ML IJ SOLN
4.0000 mg | Freq: Four times a day (QID) | INTRAMUSCULAR | Status: DC | PRN
  Administered 2011-12-02: 4 mg via INTRAVENOUS
  Filled 2011-12-01: qty 2

## 2011-12-01 MED ORDER — OXYCODONE HCL 5 MG PO TABS
5.0000 mg | ORAL_TABLET | ORAL | Status: DC | PRN
  Administered 2011-12-05 – 2011-12-17 (×11): 5 mg via ORAL
  Filled 2011-12-01 (×11): qty 1

## 2011-12-01 MED ORDER — PANTOPRAZOLE SODIUM 40 MG IV SOLR
40.0000 mg | Freq: Two times a day (BID) | INTRAVENOUS | Status: DC
  Administered 2011-12-01 – 2011-12-07 (×12): 40 mg via INTRAVENOUS
  Filled 2011-12-01 (×13): qty 40

## 2011-12-01 MED ORDER — HYDROMORPHONE HCL PF 1 MG/ML IJ SOLN
0.5000 mg | INTRAMUSCULAR | Status: DC | PRN
  Administered 2011-12-02 (×2): 1 mg via INTRAVENOUS
  Administered 2011-12-02: 0.5 mg via INTRAVENOUS
  Administered 2011-12-03 (×3): 1 mg via INTRAVENOUS
  Administered 2011-12-04: 0.5 mg via INTRAVENOUS
  Administered 2011-12-04 – 2011-12-07 (×14): 1 mg via INTRAVENOUS
  Administered 2011-12-07 (×2): 0.5 mg via INTRAVENOUS
  Administered 2011-12-07: 1 mg via INTRAVENOUS
  Administered 2011-12-08: 0.5 mg via INTRAVENOUS
  Administered 2011-12-08 (×4): 1 mg via INTRAVENOUS
  Administered 2011-12-08 (×2): 0.5 mg via INTRAVENOUS
  Administered 2011-12-08 – 2011-12-18 (×41): 1 mg via INTRAVENOUS
  Filled 2011-12-01 (×76): qty 1

## 2011-12-01 MED ORDER — ONDANSETRON HCL 4 MG PO TABS: 4.0000 mg | ORAL_TABLET | Freq: Four times a day (QID) | ORAL | Status: DC | PRN

## 2011-12-01 MED ORDER — HYDROMORPHONE HCL PF 1 MG/ML IJ SOLN
1.0000 mg | Freq: Once | INTRAMUSCULAR | Status: AC
  Administered 2011-12-01: 1 mg via INTRAVENOUS
  Filled 2011-12-01: qty 1

## 2011-12-01 MED ORDER — ONDANSETRON 8 MG PO TBDP: 8.0000 mg | ORAL_TABLET | Freq: Three times a day (TID) | ORAL | Status: DC | PRN

## 2011-12-01 MED ORDER — METRONIDAZOLE IN NACL 5-0.79 MG/ML-% IV SOLN
500.0000 mg | Freq: Three times a day (TID) | INTRAVENOUS | Status: DC
  Administered 2011-12-02 – 2011-12-05 (×10): 500 mg via INTRAVENOUS
  Filled 2011-12-01 (×12): qty 100

## 2011-12-01 MED ORDER — ALUM & MAG HYDROXIDE-SIMETH 200-200-20 MG/5ML PO SUSP
30.0000 mL | Freq: Four times a day (QID) | ORAL | Status: DC | PRN
  Filled 2011-12-01: qty 30

## 2011-12-01 MED ORDER — IOHEXOL 300 MG/ML  SOLN
100.0000 mL | Freq: Once | INTRAMUSCULAR | Status: AC | PRN
  Administered 2011-12-01: 100 mL via INTRAVENOUS

## 2011-12-01 MED ORDER — PROMETHAZINE HCL 25 MG/ML IJ SOLN
25.0000 mg | Freq: Once | INTRAMUSCULAR | Status: AC
  Administered 2011-12-01: 25 mg via INTRAVENOUS
  Filled 2011-12-01: qty 1

## 2011-12-01 MED ORDER — METRONIDAZOLE IN NACL 5-0.79 MG/ML-% IV SOLN
500.0000 mg | Freq: Once | INTRAVENOUS | Status: AC
  Administered 2011-12-01: 500 mg via INTRAVENOUS
  Filled 2011-12-01: qty 100

## 2011-12-01 MED ORDER — ONDANSETRON HCL 4 MG/2ML IJ SOLN
INTRAMUSCULAR | Status: AC
  Filled 2011-12-01: qty 2

## 2011-12-01 MED ORDER — CIPROFLOXACIN IN D5W 400 MG/200ML IV SOLN
400.0000 mg | Freq: Once | INTRAVENOUS | Status: AC
  Administered 2011-12-01: 400 mg via INTRAVENOUS
  Filled 2011-12-01: qty 200

## 2011-12-01 MED ORDER — ONDANSETRON HCL 4 MG/2ML IJ SOLN
INTRAMUSCULAR | Status: AC
  Administered 2011-12-02: 4 mg via INTRAVENOUS
  Filled 2011-12-01: qty 2

## 2011-12-01 MED ORDER — SODIUM CHLORIDE 0.9 % IV SOLN
Freq: Once | INTRAVENOUS | Status: AC
  Administered 2011-12-01: 17:00:00 via INTRAVENOUS

## 2011-12-01 MED ORDER — ZOLPIDEM TARTRATE 5 MG PO TABS
5.0000 mg | ORAL_TABLET | Freq: Every evening | ORAL | Status: DC | PRN
  Administered 2011-12-11 – 2011-12-13 (×2): 5 mg via ORAL
  Filled 2011-12-01 (×3): qty 1

## 2011-12-01 MED ORDER — SODIUM CHLORIDE 0.9 % IV SOLN
INTRAVENOUS | Status: DC
  Administered 2011-12-01 – 2011-12-03 (×4): via INTRAVENOUS

## 2011-12-01 MED ORDER — ACETAMINOPHEN 325 MG PO TABS
650.0000 mg | ORAL_TABLET | Freq: Four times a day (QID) | ORAL | Status: DC | PRN
  Filled 2011-12-01: qty 2

## 2011-12-01 MED ORDER — CIPROFLOXACIN IN D5W 400 MG/200ML IV SOLN
400.0000 mg | Freq: Two times a day (BID) | INTRAVENOUS | Status: DC
  Administered 2011-12-02 – 2011-12-05 (×7): 400 mg via INTRAVENOUS
  Filled 2011-12-01 (×8): qty 200

## 2011-12-01 MED ORDER — IOHEXOL 300 MG/ML  SOLN
20.0000 mL | INTRAMUSCULAR | Status: AC
  Administered 2011-12-01: 20 mL via ORAL

## 2011-12-01 NOTE — ED Notes (Signed)
[id number] Ready

## 2011-12-01 NOTE — ED Notes (Signed)
Pt noted to be vomiting. Pt claimed he vomited after he drank water. Dr. Edison Pace made aware.

## 2011-12-01 NOTE — ED Provider Notes (Signed)
Assumed care from Dr. Roderic Palau.  Patient has history of ulcerative colitis.  He presents to emergency department with persistent pain and diarrhea.  He does not have a fever.  He does have abdominal tenderness and he has a leukocytosis.  His CAT scan is consistent with an exacerbation of ulcerative colitis.  We will admit him to the hospital for further evaluation and treatment.  Keith Picker, MD 12/01/11 2023

## 2011-12-01 NOTE — ED Notes (Signed)
Pt resting comfortably, Fluid challenge has been started. Will monitor

## 2011-12-01 NOTE — ED Notes (Signed)
Ordered medications administered, report given to RN; pt waiting for admission and room placement

## 2011-12-01 NOTE — ED Notes (Signed)
abd pain vomiting since yesterday with diarrhea

## 2011-12-01 NOTE — ED Notes (Signed)
vomitng x 3 days

## 2011-12-01 NOTE — ED Notes (Signed)
Pt is tolerating sips of water. Pt was encouraged to drink a little at a time when he gets home. Pt verbalized understanding. No vomiting noted.

## 2011-12-01 NOTE — ED Provider Notes (Signed)
History     CSN: [id number]  Arrival date & time 12/01/11  0346   First MD Initiated Contact with Patient 12/01/11 0406      Chief Complaint  Patient presents with  . Abdominal Pain     Patient is a 31 y.o. male presenting with abdominal pain. The history is provided by the patient.  Abdominal Pain The primary symptoms of the illness include abdominal pain, nausea and vomiting. The primary symptoms of the illness do not include fever or fatigue. The current episode started yesterday. The onset of the illness was gradual. The problem has been gradually worsening.  Additional symptoms associated with the illness include chills.  pt reports he has had vomiting and abd pain since eating "Fast Food" about two days ago He reports multiple episodes of vomiting, nonbloody emesis No diarrhea Also reports abd cramping No recent rectal bleeding, and reports normal BM - he reports he has h/o ulcerative colitis no recent issues with this illness  Past Medical History  Diagnosis Date  . Ulcerative colitis     History reviewed. No pertinent past surgical history.  History reviewed. No pertinent family history.  History  Substance Use Topics  . Smoking status: Current Everyday Smoker  . Smokeless tobacco: Not on file  . Alcohol Use: No      Review of Systems  Constitutional: Positive for chills. Negative for fever and fatigue.  Gastrointestinal: Positive for nausea, vomiting and abdominal pain.  All other systems reviewed and are negative.    Allergies  Review of patient's allergies indicates no known allergies.  Home Medications   Current Outpatient Rx  Name Route Sig Dispense Refill  . FERROUS GLUCONATE 325 MG PO TABS Oral Take 325 mg by mouth daily with breakfast.    . MESALAMINE 400 MG PO TBEC Oral Take 400 mg by mouth 3 (three) times daily.    Marland Kitchen POTASSIUM PO Oral Take by mouth daily. Patient doesn't know meq    . RISPERIDONE 1 MG PO TABS Oral Take 1 mg by mouth daily.       BP 136/86  Pulse 103  Temp(Src) 98.2 F (36.8 C) (Oral)  Resp 20  SpO2 97%  Physical Exam CONSTITUTIONAL: Well developed/well nourished HEAD AND FACE: Normocephalic/atraumatic EYES: EOMI/PERRL, no scleral icterus ENMT: Mucous membranes dry NECK: supple no meningeal signs SPINE:entire spine nontender CV: S1/S2 noted, no murmurs/rubs/gallops noted LUNGS: Lungs are clear to auscultation bilaterally, no apparent distress ABDOMEN: soft, diffuse tenderness but no rebound/guarding GU:no cva tenderness NEURO: Pt is awake/alert, moves all extremitiesx4 EXTREMITIES: pulses normal, full ROM SKIN: warm, color normal PSYCH: no abnormalities of mood noted  ED Course  Procedures  Labs Reviewed  CBC - Abnormal; Notable for the following:    WBC 14.9 (*)    All other components within normal limits  DIFFERENTIAL - Abnormal; Notable for the following:    Neutro Abs 9.4 (*)    Monocytes Relative 13 (*)    Monocytes Absolute 1.9 (*)    Eosinophils Relative 10 (*)    Eosinophils Absolute 1.5 (*)    All other components within normal limits  COMPREHENSIVE METABOLIC PANEL - Abnormal; Notable for the following:    Potassium 3.2 (*)    Glucose, Bld 122 (*)    Total Protein 8.4 (*)    All other components within normal limits  LIPASE, BLOOD   7:04 AM Will attempt PO challenge and reassess  8:02 AM Pt improved He no longer has abd pain He is  well appearing, no distress My suspicion for acute process is low He reports his UC is well controlled previous to this episode  MDM  Nursing notes reviewed and considered in documentation All labs/vitals reviewed and considered Previous records reviewed and considered         Sharyon Cable, MD 12/01/11 [phone]

## 2011-12-01 NOTE — ED Notes (Signed)
Patient presents with c/o nausea and vomiting since last evening.  States his abd is hurting due to throwing up.  Denies diarrhea

## 2011-12-01 NOTE — Discharge Instructions (Signed)
Abdominal (belly) pain can be caused by many things. any cases can be observed and treated at home after initial evaluation in the emergency department. Even though you are being discharged home, abdominal pain can be unpredictable. Therefore, you need a repeated exam if your pain does not resolve, returns, or worsens. Most patients with abdominal pain don't have to be admitted to the hospital or have surgery, but serious problems like appendicitis and gallbladder attacks can start out as nonspecific pain. Many abdominal conditions cannot be diagnosed in one visit, so follow-up evaluations are very important. SEEK IMMEDIATE MEDICAL ATTENTION IF: The pain does not go away or becomes severe, particularly over the next 8-12 hours.  A temperature above 100.69F develops.  Repeated vomiting occurs (multiple episodes).  The pain becomes localized to portions of the abdomen. The right side could possibly be appendicitis. In an adult, the left lower portion of the abdomen could be colitis or diverticulitis.  Blood is being passed in stools or vomit (bright red or black tarry stools).  Return also if you develop chest pain, difficulty breathing, dizziness or fainting, or become confused, poorly responsive

## 2011-12-02 ENCOUNTER — Encounter (HOSPITAL_COMMUNITY): Payer: Self-pay | Admitting: Internal Medicine

## 2011-12-02 DIAGNOSIS — R1084 Generalized abdominal pain: Secondary | ICD-10-CM

## 2011-12-02 DIAGNOSIS — F200 Paranoid schizophrenia: Secondary | ICD-10-CM | POA: Diagnosis present

## 2011-12-02 DIAGNOSIS — K529 Noninfective gastroenteritis and colitis, unspecified: Secondary | ICD-10-CM | POA: Diagnosis present

## 2011-12-02 DIAGNOSIS — R197 Diarrhea, unspecified: Secondary | ICD-10-CM | POA: Diagnosis present

## 2011-12-02 DIAGNOSIS — D72829 Elevated white blood cell count, unspecified: Secondary | ICD-10-CM | POA: Diagnosis present

## 2011-12-02 DIAGNOSIS — K5190 Ulcerative colitis, unspecified, without complications: Principal | ICD-10-CM

## 2011-12-02 DIAGNOSIS — R112 Nausea with vomiting, unspecified: Secondary | ICD-10-CM

## 2011-12-02 LAB — BASIC METABOLIC PANEL
CO2: 27 mEq/L (ref 19–32)
Chloride: 108 mEq/L (ref 96–112)
GFR calc Af Amer: >90 mL/min (ref >90)
Potassium: 3.4 mEq/L — ABNORMAL LOW (ref 3.5–5.1)
Sodium: 141 mEq/L (ref 135–145)

## 2011-12-02 LAB — CBC
MCH: 27.6 pg (ref 26.0–34.0)
MCHC: 32.3 g/dL (ref 30.0–36.0)
Platelets: 301 10*3/uL (ref 150–400)
RBC: 4.27 MIL/uL (ref 4.22–5.81)
RDW: 15.7 % — ABNORMAL HIGH (ref 11.5–15.5)

## 2011-12-02 MED ORDER — RISPERIDONE 1 MG PO TABS
1.0000 mg | ORAL_TABLET | Freq: Every day | ORAL | Status: DC
  Administered 2011-12-03 – 2011-12-18 (×15): 1 mg via ORAL
  Filled 2011-12-02 (×18): qty 1

## 2011-12-02 MED ORDER — POTASSIUM CHLORIDE 10 MEQ/100ML IV SOLN
10.0000 meq | INTRAVENOUS | Status: AC
  Administered 2011-12-02 (×4): 10 meq via INTRAVENOUS
  Filled 2011-12-02 (×4): qty 100

## 2011-12-02 MED ORDER — ONDANSETRON HCL 4 MG/2ML IJ SOLN
4.0000 mg | Freq: Four times a day (QID) | INTRAMUSCULAR | Status: DC
  Administered 2011-12-02 – 2011-12-18 (×36): 4 mg via INTRAVENOUS
  Filled 2011-12-02 (×38): qty 2

## 2011-12-02 MED ORDER — METHYLPREDNISOLONE SODIUM SUCC 125 MG IJ SOLR: 125.0000 mg | Freq: Two times a day (BID) | INTRAMUSCULAR | Status: DC

## 2011-12-02 MED ORDER — ONDANSETRON HCL 4 MG PO TABS
4.0000 mg | ORAL_TABLET | Freq: Four times a day (QID) | ORAL | Status: DC
  Administered 2011-12-04 – 2011-12-18 (×19): 4 mg via ORAL
  Filled 2011-12-02 (×66): qty 1

## 2011-12-02 MED ORDER — METHYLPREDNISOLONE SODIUM SUCC 125 MG IJ SOLR: 125.0000 mg | Freq: Once | INTRAMUSCULAR | Status: DC

## 2011-12-02 NOTE — Consult Note (Signed)
Referring Provider: [facility] Primary Care Physician:  Pcp Not In System Primary Gastroenterologist: none  Reason for Consultation:  Acute nausea vomiting and abdominal pain x3 days  HPI: [name] is a 31 y.o. male with history of ulcerative colitis which was diagnosed at age 38. He has history of depression, schizophrenia, and prior history of polysubstance abuse. He relates to me that he was just released from prison about 3 weeks ago and was taking Asacol regularly while he was there over the past 18 months. He was taking 12 tablets per day and says that his colitis symptoms have been under good control. He has not had any recent problems with diarrhea or rectal bleeding.  He states that about 4 days ago he started abruptly with abdominal cramping which is rather diffuse and multiple episodes of nausea and vomiting which became intractable. To me he really denies any significant diarrhea and has not had any rectal bleeding. He is not sure whether he had any fever but has not had any chills. Is not aware of any known infectious exposures, denies any current drug use or alcohol use and has not been taking any regular aspirin or NSAIDs.  Review of his records show that he did have a colonoscopy with Dr. Allie Bossier gait done in 2006 which showed universal ulcerative colitis. He was admitted here in 2009 with gastroenteritis type symptoms and colonoscopy was suggested but the patient declined.  CT scan of the abdomen and pelvis done yesterday shows wall thickening of the rectum and sigmoid colon and extensive pericolonic adenopathy which is not change since prior CT of 2 years ago and is felt to be reactive.  He feels a little bit better this morning is not quite as nauseated.   Past Medical History  Diagnosis Date  . Ulcerative colitis   . Schizophrenia     Past Surgical History  Procedure Date  . Mandible fracture surgery     Prior to Admission medications   Not on File     Current Facility-Administered Medications  Medication Dose Route Frequency Provider Last Rate Last Dose  . 0.9 %  sodium chloride infusion   Intravenous Once Maudry Diego, MD 125 mL/hr at 12/01/11 2114    . 0.9 %  sodium chloride infusion   Intravenous Continuous Theressa Millard, MD 125 mL/hr at 12/02/11 [phone]    . acetaminophen (TYLENOL) tablet 650 mg  650 mg Oral Q6H PRN Theressa Millard, MD       Or  . acetaminophen (TYLENOL) suppository 650 mg  650 mg Rectal Q6H PRN Theressa Millard, MD      . alum & mag hydroxide-simeth (MAALOX/MYLANTA) 200-200-20 MG/5ML suspension 30 mL  30 mL Oral Q6H PRN Theressa Millard, MD      . ciprofloxacin (CIPRO) IVPB 400 mg  400 mg Intravenous Once Elmer Picker, MD   400 mg at 12/01/11 2111  . ciprofloxacin (CIPRO) IVPB 400 mg  400 mg Intravenous Q12H Theressa Millard, MD      . HYDROmorphone (DILAUDID) injection 0.5-1 mg  0.5-1 mg Intravenous Q3H PRN Theressa Millard, MD   0.5 mg at 12/02/11 0313  . HYDROmorphone (DILAUDID) injection 1 mg  1 mg Intravenous Once Maudry Diego, MD   1 mg at 12/01/11 2110  . iohexol (OMNIPAQUE) 300 MG/ML solution 100 mL  100 mL Intravenous Once PRN Medication Radiologist, MD   100 mL at 12/01/11 1938  . iohexol (OMNIPAQUE) 300 MG/ML solution 20  mL  20 mL Oral Q1 Hr x 2 Maudry Diego, MD   20 mL at 12/01/11 1716  . methylPREDNISolone sodium succinate (SOLU-MEDROL) 125 MG injection 125 mg  125 mg Intravenous Q12H Harvette C Jenkins, MD      . methylPREDNISolone sodium succinate (SOLU-MEDROL) 125 MG injection 125 mg  125 mg Intravenous Once Theressa Millard, MD      . metroNIDAZOLE (FLAGYL) IVPB 500 mg  500 mg Intravenous Once Elmer Picker, MD   500 mg at 12/01/11 2246  . metroNIDAZOLE (FLAGYL) IVPB 500 mg  500 mg Intravenous Q8H Theressa Millard, MD   500 mg at 12/02/11 0527  . ondansetron (ZOFRAN) 4 MG/2ML injection           . ondansetron (ZOFRAN) injection 4 mg  4 mg Intravenous Once Maudry Diego, MD   4 mg at 12/01/11 1917  . ondansetron (ZOFRAN) injection 4 mg  4 mg Intravenous Once Maudry Diego, MD   4 mg at 12/01/11 2113  . ondansetron (ZOFRAN) tablet 4 mg  4 mg Oral Q6H PRN Theressa Millard, MD       Or  . ondansetron (ZOFRAN) injection 4 mg  4 mg Intravenous Q6H PRN Theressa Millard, MD   4 mg at 12/02/11 0908  . oxyCODONE (Oxy IR/ROXICODONE) immediate release tablet 5 mg  5 mg Oral Q4H PRN Theressa Millard, MD      . pantoprazole (PROTONIX) injection 40 mg  40 mg Intravenous Q12H Theressa Millard, MD   40 mg at 12/01/11 2252  . potassium chloride 10 mEq in 100 mL IVPB  10 mEq Intravenous Q1 Hr x 4 Sherryl Manges, MD   10 mEq at 12/02/11 9798  . risperiDONE (RISPERDAL) tablet 1 mg  1 mg Oral Daily Harvette C Jenkins, MD      . sodium chloride 0.9 % bolus 1,000 mL  1,000 mL Intravenous Once Maudry Diego, MD   1,000 mL at 12/01/11 2113  . zolpidem (AMBIEN) tablet 5 mg  5 mg Oral QHS PRN Theressa Millard, MD       Facility-Administered Medications Ordered in Other Encounters  Medication Dose Route Frequency Provider Last Rate Last Dose  . DISCONTD: ondansetron (ZOFRAN) 4 MG/2ML injection             Allergies as of 12/01/2011  . (No Known Allergies)    No family history on file.  History   Social History  . Marital Status: Single    Spouse Name: N/A    Number of Children: N/A  . Years of Education: N/A   Occupational History  . Not on file.   Social History Main Topics  . Smoking status: Current Everyday Smoker  . Smokeless tobacco: Not on file  . Alcohol Use: No  . Drug Use: No  . Sexually Active:    Other Topics Concern  . Not on file   Social History Narrative  . No narrative on file    Review of Systems: Pertinent positive and negative review of systems were noted in the above HPI section.  All other review of systems was otherwise negative. rPhysical Exam: Vital signs in last 24 hours: Temp:  [98.4 F (36.9 C)-99.3 F  (37.4 C)] 99.3 F (37.4 C) (02/16 0944) Pulse Rate:  [60-79] 60  (02/16 0944) Resp:  [14-19] 17  (02/16 0944) BP: (104-141)/(56-89) 139/83 mmHg (02/16 0944) SpO2:  [97 %-100 %] 100 % (02/16 0944) Weight:  [  186 lb 1.1 oz (84.4 kg)] 186 lb 1.1 oz (84.4 kg) (02/16 0502) Last BM Date: 12/01/11 General:   Alert,  Well-developed, well-nourished, pleasant and cooperative in NAD Head:  Normocephalic and atraumatic. Eyes:  Sclera clear, no icterus.   Conjunctiva pink. Ears:  Normal auditory acuity. Nose:  No deformity, discharge,  or lesions. Mouth:  No deformity or lesions.   Neck:  Supple; no masses or thyromegaly. Lungs:  Clear throughout to auscultation.   No wheezes, crackles, or rhonchi. Heart:  Regular rate and rhythm; no murmurs, clicks, rubs,  or gallops. Abdomen:  Soft, mild diffuse tenderness no rebound bowel sounds are active no palpable mass or hepatosplenomegaly Rectal not done  Msk:  Symmetrical without gross deformities. . Pulses:  Normal pulses noted. Extremities:  Without clubbing or edema. Neurologic:  Alert and  oriented x4;  grossly normal neurologically. Skin:  Intact without significant lesions or rashes.. Psych:  Alert and cooperative. Normal mood and affect.  Intake/Output from previous day:   Intake/Output this shift: Total I/O In: 480 [P.O.:480] Out: -   Lab Results:  Basename 12/02/11 0600 12/01/11 1555 12/01/11 0430  WBC 9.4 14.4* 14.9*  HGB 11.8* 14.2 15.7  HCT 36.5* 41.8 45.2  PLT 301 332 344   BMET  Basename 12/02/11 0600 12/01/11 1555 12/01/11 0430  NA 141 141 139  K 3.4* 3.4* 3.2*  CL 108 104 97  CO2 27 29 31   GLUCOSE 106* 107* 122*  BUN 12 14 15   CREATININE 0.87 0.92 0.97  CALCIUM 8.7 9.5 10.0   LFT  Basename 12/01/11 1555  PROT 7.5  ALBUMIN 3.9  AST 11  ALT 11  ALKPHOS 74  BILITOT 0.5  BILIDIR --  IBILI --    Studies/Results: Ct Abdomen Pelvis W Contrast  12/01/2011  *RADIOLOGY REPORT*  Clinical Data: Abdominal pain.    Nausea vomiting.  History of ulcerative colitis.  CT ABDOMEN AND PELVIS WITH CONTRAST  Technique:  Multidetector CT imaging of the abdomen and pelvis was performed following the standard protocol during bolus administration of intravenous contrast.  Contrast: 170m OMNIPAQUE IOHEXOL 300 MG/ML IV SOLN  Comparison: 12/17/2009  Findings: Clear lung bases.  Normal heart size without pericardial or pleural effusion.  Probable mild hepatic steatosis.  Normal spleen, stomach, pancreas, gallbladder, biliary tract, adrenal glands, kidneys.  A retroaortic left renal vein.  Mild retroperitoneal adenopathy.  1.1 cm left periaortic node is unchanged on image 52.  Wall thickening involves the rectum and sigmoid colon.  Distal descending colonic wall thickening.  Areas of underdistension within the splenic flexure and transverse colon. Normal terminal ileum and appendix.  Normal small bowel without abdominal ascites.  There is extensive pericolonic adenopathy.  Nodes adjacent the splenic flexure measure up to 1.1 cm on image 30 unchanged. Adenopathy within the sigmoid mesocolon measures 1.1 cm on image 72.  Similar to on the prior.  No pelvic sidewall adenopathy. Normal urinary bladder and prostate. No significant free fluid.  Partial fusion of the left sacroiliac joint is unchanged and could represent chronic sacroiliitis or be due to age advanced degenerative change.  IMPRESSION:  1.  Left-sided colitis.  Likely related to the clinical history of ulcerative colitis.  No obstruction or other complication. 2.  Extensive pericolonic and less so retroperitoneal adenopathy. Similar since 12/17/2009, therefore likely reactive.  Original Report Authenticated By: KAreta Haber M.D.    IMPRESSION:  #[id number] old African American male with history of universal ulcerative colitis diagnosed about 7 years ago.  He had been maintained on Asacol , up until about 2-1/2 weeks ago and his symptoms had been in remission. CT scan done on  admission shows evidence of wall thickening of the rectum and sigmoid which represents his known colitis. #2 acute illness x4 days with nausea vomiting and crampy abdominal pain minimal diarrhea. I think he has an infectious gastroenteritis likely viral. Do not think he is having an exacerbation of his ulcerative colitis. Could not rule out a bacterial enterocolitis. #3 history of schizophrenia, and depression  PLAN: #1 stool cultures and stool for C. difficile if patient has diarrhea  #2 He  is being covered empirically with Cipro/flagyl which is fine for now #3 would treat with around-the-clock antiemetics and pain control and supportive management #4 he does not need IV steroids, will discontinue #5 continue Asacol455m- 4 tablets 3 times daily   #6 clear liquid diet and gradually advance as he tolerates No plans for endoscopic intervention unless she fails to improve over the next several days.    Becca Bayne  12/02/2011, 10:44 AM

## 2011-12-02 NOTE — H&P (Addendum)
DATE OF ADMISSION:  12/02/2011  PCP:  No PCP   Chief Complaint: ABD Pain Nausea Vomiting and Diarrhea  HPI: [name] is an 31 y.o. male who presents with complaints of generalized severe 120/10 sharp crampy intermittent ABD pain associated with Diarrhea, and nausea and vomiting for the past 3 days.  Patient denies hematemesis,hematochezia, and melena passage. He denies fevers and chills as well.  He states he has no but he takes the following medications:  Asacol, Potassium, and Risperdal as well as Iron.       Past Medical History  Diagnosis Date  . Ulcerative colitis   . Schizophrenia     Past Surgical History  Procedure Date  . Mandible fracture surgery     Medications:  HOME MEDS: Prior to Admission medications   Not on File    Allergies:  No Known Allergies  Social History: Past history of polysubstance abuse  reports that he has been smoking.  He does not have any smokeless tobacco history on file. He reports that he does not drink alcohol or use illicit drugs.  Family History: No family history on file.  Review of Systems:  The patient denies anorexia, fever, weight loss, vision loss, decreased hearing, hoarseness, chest pain, syncope, dyspnea on exertion, peripheral edema, balance deficits, hemoptysis, melena, hematochezia, severe indigestion/heartburn, hematuria, incontinence, genital sores, muscle weakness, suspicious skin lesions, transient blindness, difficulty walking, depression, unusual weight change, abnormal bleeding, enlarged lymph nodes, angioedema, and breast masses.   Physical Exam:  GEN:  Pleasant  31 year old well nourished and well developed African American male examined and in no acute distress; cooperative with exam Filed Vitals:   12/01/11 1446 12/01/11 1910 12/01/11 2259  BP: 140/75 141/89 104/59  Pulse: 79 78 66  Temp: 98.9 F (37.2 C) 99.3 F (37.4 C) 98.4 F (36.9 C)  TempSrc: Oral Oral Oral  Resp: 14 16 18   SpO2: 98% 100% 97%    Blood pressure 104/59, pulse 66, temperature 98.4 F (36.9 C), temperature source Oral, resp. rate 18, SpO2 97.00%. PSYCH: He is alert and oriented x4; does not appear anxious does not appear depressed; affect is normal HEENT: Normocephalic and Atraumatic, Mucous membranes pink; PERRLA; EOM intact; Fundi:  Benign;  No scleral icterus, Nares: Patent, Oropharynx: Clear with Sparse Poor Dentition, Neck:  FROM, no cervical lymphadenopathy nor thyromegaly or carotid bruit; no JVD; Breasts:: Not examined CHEST WALL: No tenderness CHEST: Normal respiration, clear to auscultation bilaterally HEART: Regular rate and rhythm; no murmurs rubs or gallops BACK: No kyphosis or scoliosis; no CVA tenderness ABDOMEN: Positive Bowel Sounds, Soft mildly tender, No Rebound No Guarding, no masses, no organomegaly. Rectal Exam: Not done EXTREMITIES: No bone or joint deformity; age-appropriate arthropathy of the hands and knees; no cyanosis, clubbing or edema; no ulcerations. Genitalia: not examined PULSES: 2+ and symmetric SKIN: Normal hydration no rash or ulceration CNS: Cranial nerves 2-12 grossly intact no focal neurologic deficit   Labs & Imaging Results for orders placed during the hospital encounter of 12/01/11 (from the past 48 hour(s))  CBC     Status: Abnormal   Collection Time   12/01/11  3:55 PM      Component Value Range Comment   WBC 14.4 (*) 4.0 - 10.5 (K/uL)    RBC 4.97  4.22 - 5.81 (MIL/uL)    Hemoglobin 14.2  13.0 - 17.0 (g/dL)    HCT 41.8  39.0 - 52.0 (%)    MCV 84.1  78.0 - 100.0 (fL)  MCH 28.6  26.0 - 34.0 (pg)    MCHC 34.0  30.0 - 36.0 (g/dL)    RDW 15.4  11.5 - 15.5 (%)    Platelets 332  150 - 400 (K/uL)   DIFFERENTIAL     Status: Abnormal   Collection Time   12/01/11  3:55 PM      Component Value Range Comment   Neutrophils Relative 71  43 - 77 (%)    Neutro Abs 10.3 (*) 1.7 - 7.7 (K/uL)    Lymphocytes Relative 9 (*) 12 - 46 (%)    Lymphs Abs 1.3  0.7 - 4.0 (K/uL)     Monocytes Relative 13 (*) 3 - 12 (%)    Monocytes Absolute 1.8 (*) 0.1 - 1.0 (K/uL)    Eosinophils Relative 7 (*) 0 - 5 (%)    Eosinophils Absolute 1.0 (*) 0.0 - 0.7 (K/uL)    Basophils Relative 0  0 - 1 (%)    Basophils Absolute 0.0  0.0 - 0.1 (K/uL)   COMPREHENSIVE METABOLIC PANEL     Status: Abnormal   Collection Time   12/01/11  3:55 PM      Component Value Range Comment   Sodium 141  135 - 145 (mEq/L)    Potassium 3.4 (*) 3.5 - 5.1 (mEq/L)    Chloride 104  96 - 112 (mEq/L)    CO2 29  19 - 32 (mEq/L)    Glucose, Bld 107 (*) 70 - 99 (mg/dL)    BUN 14  6 - 23 (mg/dL)    Creatinine, Ser 0.92  0.50 - 1.35 (mg/dL)    Calcium 9.5  8.4 - 10.5 (mg/dL)    Total Protein 7.5  6.0 - 8.3 (g/dL)    Albumin 3.9  3.5 - 5.2 (g/dL)    AST 11  0 - 37 (U/L)    ALT 11  0 - 53 (U/L)    Alkaline Phosphatase 74  39 - 117 (U/L)    Total Bilirubin 0.5  0.3 - 1.2 (mg/dL)    GFR calc non Af Amer >90  >90 (mL/min)    GFR calc Af Amer >90  >90 (mL/min)   LIPASE, BLOOD     Status: Normal   Collection Time   12/01/11  3:55 PM      Component Value Range Comment   Lipase 34  11 - 59 (U/L)    Ct Abdomen Pelvis W Contrast  12/01/2011  *RADIOLOGY REPORT*  Clinical Data: Abdominal pain.   Nausea vomiting.  History of ulcerative colitis.  CT ABDOMEN AND PELVIS WITH CONTRAST  Technique:  Multidetector CT imaging of the abdomen and pelvis was performed following the standard protocol during bolus administration of intravenous contrast.  Contrast: 138m OMNIPAQUE IOHEXOL 300 MG/ML IV SOLN  Comparison: 12/17/2009  Findings: Clear lung bases.  Normal heart size without pericardial or pleural effusion.  Probable mild hepatic steatosis.  Normal spleen, stomach, pancreas, gallbladder, biliary tract, adrenal glands, kidneys.  A retroaortic left renal vein.  Mild retroperitoneal adenopathy.  1.1 cm left periaortic node is unchanged on image 52.  Wall thickening involves the rectum and sigmoid colon.  Distal descending colonic  wall thickening.  Areas of underdistension within the splenic flexure and transverse colon. Normal terminal ileum and appendix.  Normal small bowel without abdominal ascites.  There is extensive pericolonic adenopathy.  Nodes adjacent the splenic flexure measure up to 1.1 cm on image 30 unchanged. Adenopathy within the sigmoid mesocolon measures 1.1 cm on image  72.  Similar to on the prior.  No pelvic sidewall adenopathy. Normal urinary bladder and prostate. No significant free fluid.  Partial fusion of the left sacroiliac joint is unchanged and could represent chronic sacroiliitis or be due to age advanced degenerative change.  IMPRESSION:  1.  Left-sided colitis.  Likely related to the clinical history of ulcerative colitis.  No obstruction or other complication. 2.  Extensive pericolonic and less so retroperitoneal adenopathy. Similar since 12/17/2009, therefore likely reactive.  Original Report Authenticated By: Areta Haber, M.D.      Assessment/Plan: Present on Admission:  .ULCERATIVE COLITIS .Abdominal pain, generalized .Nausea and vomiting .Diarrhea .Leukocytosis .HYPOKALEMIA .Ulcerative colitis .Colitis .Schizophrenia   Plan:          IV Cipro and Flagyl Pain Control IV antiemetics PRN IVFs Clear liquid Diet Replete K+ Check Magnesium DVT Prophylaxis Other plans as per orders.     CODE STATUS:      FULL CODE       Benjaman Artman C 12/02/2011, 1:37 AM

## 2011-12-02 NOTE — Progress Notes (Signed)
Subjective: Vomited once last night, none this morning. No stools yet, this morning.  Objective: Vital signs in last 24 hours: Temp:  [98.4 F (36.9 C)-99.3 F (37.4 C)] 98.5 F (36.9 C) (02/16 0502) Pulse Rate:  [65-79] 65  (02/16 0502) Resp:  [14-19] 19  (02/16 0502) BP: (104-141)/(56-89) 107/56 mmHg (02/16 0502) SpO2:  [97 %-100 %] 98 % (02/16 0502) Weight:  [84.4 kg (186 lb 1.1 oz)] 84.4 kg (186 lb 1.1 oz) (02/16 0502) Weight change:     Intake/Output from previous day:       Physical Exam: General: Comfortable, alert, communicative, fully oriented, not short of breath at rest.  HEENT:  No clinical pallor, no jaundice, no conjunctival injection or discharge. Hydration status is satisfactory. NECK:  Supple, JVP not seen, no carotid bruits, no palpable lymphadenopathy, no palpable goiter. CHEST:  Clinically clear to auscultation, no wheezes, no crackles. HEART:  Sounds 1 and 2 heard, normal, regular, no murmurs. ABDOMEN:  Flat, soft, only mild discomfort, no palpable organomegaly, no palpable masses, normal bowel sounds. GENITALIA:  Not examined. LOWER EXTREMITIES:  No pitting edema, palpable peripheral pulses. MUSCULOSKELETAL SYSTEM:  Unremarkable. CENTRAL NERVOUS SYSTEM:  No focal neurologic deficit on gross examination.  Lab Results:  Basename 12/02/11 0600 12/01/11 1555  WBC 9.4 14.4*  HGB 11.8* 14.2  HCT 36.5* 41.8  PLT 301 332    Basename 12/02/11 0600 12/01/11 1555  NA 141 141  K 3.4* 3.4*  CL 108 104  CO2 27 29  GLUCOSE 106* 107*  BUN 12 14  CREATININE 0.87 0.92  CALCIUM 8.7 9.5   No results found for this or any previous visit (from the past 240 hour(s)).   Studies/Results: Ct Abdomen Pelvis W Contrast  12/01/2011  *RADIOLOGY REPORT*  Clinical Data: Abdominal pain.   Nausea vomiting.  History of ulcerative colitis.  CT ABDOMEN AND PELVIS WITH CONTRAST  Technique:  Multidetector CT imaging of the abdomen and pelvis was performed following the  standard protocol during bolus administration of intravenous contrast.  Contrast: 121m OMNIPAQUE IOHEXOL 300 MG/ML IV SOLN  Comparison: 12/17/2009  Findings: Clear lung bases.  Normal heart size without pericardial or pleural effusion.  Probable mild hepatic steatosis.  Normal spleen, stomach, pancreas, gallbladder, biliary tract, adrenal glands, kidneys.  A retroaortic left renal vein.  Mild retroperitoneal adenopathy.  1.1 cm left periaortic node is unchanged on image 52.  Wall thickening involves the rectum and sigmoid colon.  Distal descending colonic wall thickening.  Areas of underdistension within the splenic flexure and transverse colon. Normal terminal ileum and appendix.  Normal small bowel without abdominal ascites.  There is extensive pericolonic adenopathy.  Nodes adjacent the splenic flexure measure up to 1.1 cm on image 30 unchanged. Adenopathy within the sigmoid mesocolon measures 1.1 cm on image 72.  Similar to on the prior.  No pelvic sidewall adenopathy. Normal urinary bladder and prostate. No significant free fluid.  Partial fusion of the left sacroiliac joint is unchanged and could represent chronic sacroiliitis or be due to age advanced degenerative change.  IMPRESSION:  1.  Left-sided colitis.  Likely related to the clinical history of ulcerative colitis.  No obstruction or other complication. 2.  Extensive pericolonic and less so retroperitoneal adenopathy. Similar since 12/17/2009, therefore likely reactive.  Original Report Authenticated By: KAreta Haber M.D.    Medications: Scheduled Meds:   . sodium chloride   Intravenous Once  . ciprofloxacin  400 mg Intravenous Once  . ciprofloxacin  400 mg  Intravenous Q12H  .  HYDROmorphone (DILAUDID) injection  1 mg Intravenous Once  . iohexol  20 mL Oral Q1 Hr x 2  . methylPREDNISolone (SOLU-MEDROL) injection  125 mg Intravenous Q12H  . methylPREDNISolone (SOLU-MEDROL) injection  125 mg Intravenous Once  . metronidazole  500 mg  Intravenous Once  . metronidazole  500 mg Intravenous Q8H  . ondansetron      . ondansetron (ZOFRAN) IV  4 mg Intravenous Once  . ondansetron  4 mg Intravenous Once  . pantoprazole (PROTONIX) IV  40 mg Intravenous Q12H  . risperiDONE  1 mg Oral Daily  . sodium chloride  1,000 mL Intravenous Once   Continuous Infusions:   . sodium chloride 125 mL/hr at 12/01/11 2112   PRN Meds:.acetaminophen, acetaminophen, alum & mag hydroxide-simeth, HYDROmorphone, iohexol, ondansetron (ZOFRAN) IV, ondansetron, oxyCODONE, zolpidem  Assessment/Plan:  Principal Problem:  *ULCERATIVE COLITIS: Patient appears to have an acute flare-up of his known ulcerative colitis. Currently on Ciprofloxacin/Flagyl Day# 2, iv fluid, and parenteral steroids. Wcc has already improved. We shall check stool for C. Difficile PCR. Active Problems:  1. HYPOKALEMIA: Secondary to above. repleting as indicated.  2. Nausea and vomiting: On antiemetics.  3. Schizophrenia: Clinically stable.   Comment: Will obtain GI consultation.    LOS: 1 day   [name],CHRISTOPHER 12/02/2011, 8:23 AM

## 2011-12-02 NOTE — Consult Note (Signed)
I have taken a history, examined the patient and reviewed the chart. I agree with the extender's note, impression and recommendations. Covering for Dr. Benson Norway.  Ladene Artist MD [facility]

## 2011-12-02 NOTE — Progress Notes (Addendum)
Conversation with patient notifying him that mother was calling for information. Currently XXX status, educated that no information would be given to any family members regarding inpatient status. No information code provided currently, pt stated he would call his mother.

## 2011-12-03 LAB — COMPREHENSIVE METABOLIC PANEL
AST: 9 U/L (ref 0–37)
Albumin: 2.9 g/dL — ABNORMAL LOW (ref 3.5–5.2)
Calcium: 8.8 mg/dL (ref 8.4–10.5)
Creatinine, Ser: 0.99 mg/dL (ref 0.50–1.35)
Total Protein: 6 g/dL (ref 6.0–8.3)

## 2011-12-03 LAB — CBC
HCT: 35.5 % — ABNORMAL LOW (ref 39.0–52.0)
Hemoglobin: 11.4 g/dL — ABNORMAL LOW (ref 13.0–17.0)
MCH: 27.5 pg (ref 26.0–34.0)
MCHC: 32.1 g/dL (ref 30.0–36.0)
MCV: 85.7 fL (ref 78.0–100.0)
Platelets: 255 K/uL (ref 150–400)
RBC: 4.14 MIL/uL — ABNORMAL LOW (ref 4.22–5.81)
RDW: 15.4 % (ref 11.5–15.5)
WBC: 8.9 K/uL (ref 4.0–10.5)

## 2011-12-03 LAB — CLOSTRIDIUM DIFFICILE BY PCR: Toxigenic C. Difficile by PCR: NEGATIVE

## 2011-12-03 MED ORDER — POTASSIUM CHLORIDE IN NACL 40-0.9 MEQ/L-% IV SOLN
INTRAVENOUS | Status: DC
  Administered 2011-12-03: 150 mL/h via INTRAVENOUS
  Administered 2011-12-04 – 2011-12-06 (×7): via INTRAVENOUS
  Administered 2011-12-06: 150 mL/h via INTRAVENOUS
  Administered 2011-12-07: 12:00:00 via INTRAVENOUS
  Administered 2011-12-07: 150 mL/h via INTRAVENOUS
  Administered 2011-12-07: 14:00:00 via INTRAVENOUS
  Administered 2011-12-08 – 2011-12-12 (×2): 10 mL/h via INTRAVENOUS
  Administered 2011-12-15: 20 mL/h via INTRAVENOUS
  Administered 2011-12-15: 10 mL/h via INTRAVENOUS
  Administered 2011-12-16 – 2011-12-17 (×4): via INTRAVENOUS
  Filled 2011-12-03 (×30): qty 1000

## 2011-12-03 MED ORDER — SODIUM CHLORIDE 0.9 % IV BOLUS (SEPSIS)
500.0000 mL | Freq: Once | INTRAVENOUS | Status: AC
  Administered 2011-12-03: 500 mL via INTRAVENOUS

## 2011-12-03 MED ORDER — PROMETHAZINE HCL 25 MG/ML IJ SOLN
25.0000 mg | Freq: Four times a day (QID) | INTRAMUSCULAR | Status: DC | PRN
  Administered 2011-12-03 – 2011-12-17 (×17): 25 mg via INTRAVENOUS
  Filled 2011-12-03 (×18): qty 1

## 2011-12-03 NOTE — Progress Notes (Signed)
IV infiltrated. Removed, assessed for new site, unable to visualize new site,  and IV team contacted.

## 2011-12-03 NOTE — Progress Notes (Signed)
Subjective: Vomited x 5 today, despite Zofran. No diarrhea.  Objective: Vital signs in last 24 hours: Temp:  [97.8 F (36.6 C)-98.4 F (36.9 C)] 97.8 F (36.6 C) (02/17 1345) Pulse Rate:  [47-64] 64  (02/17 1345) Resp:  [16-18] 18  (02/17 1345) BP: (95-140)/(50-91) 95/50 mmHg (02/17 1345) SpO2:  [97 %-100 %] 98 % (02/17 1345) Weight:  [88.7 kg (195 lb 8.8 oz)] 88.7 kg (195 lb 8.8 oz) (02/17 0651) Weight change: 4.3 kg (9 lb 7.7 oz) Last BM Date: 12/03/11  Intake/Output from previous day: 02/16 0701 - 02/17 0700 In: 2245 [P.O.:720; I.V.:1525] Out: 1 [Stool:1]     Physical Exam: General: Comfortable, alert, communicative, fully oriented, not short of breath at rest, BP is borderline.  HEENT:  No clinical pallor, no jaundice, no conjunctival injection or discharge. Hydration status is fair. NECK:  Supple, JVP not seen, no carotid bruits, no palpable lymphadenopathy, no palpable goiter. CHEST:  Clinically clear to auscultation, no wheezes, no crackles. HEART:  Sounds 1 and 2 heard, normal, regular, no murmurs. ABDOMEN:  Flat, soft, only mild discomfort, no palpable organomegaly, no palpable masses, normal bowel sounds. GENITALIA:  Not examined. LOWER EXTREMITIES:  No pitting edema, palpable peripheral pulses. MUSCULOSKELETAL SYSTEM:  Unremarkable. CENTRAL NERVOUS SYSTEM:  No focal neurologic deficit on gross examination.  Lab Results:  Basename 12/03/11 0645 12/02/11 0600  WBC 8.9 9.4  HGB 11.4* 11.8*  HCT 35.5* 36.5*  PLT 255 301    Basename 12/03/11 0645 12/02/11 0600  NA 138 141  K 3.3* 3.4*  CL 104 108  CO2 27 27  GLUCOSE 93 106*  BUN 8 12  CREATININE 0.99 0.87  CALCIUM 8.8 8.7   Recent Results (from the past 240 hour(s))  CLOSTRIDIUM DIFFICILE BY PCR     Status: Normal   Collection Time   12/02/11  8:38 PM      Component Value Range Status Comment   C difficile by pcr NEGATIVE  NEGATIVE  Final      Studies/Results: Ct Abdomen Pelvis W  Contrast  12/01/2011  *RADIOLOGY REPORT*  Clinical Data: Abdominal pain.   Nausea vomiting.  History of ulcerative colitis.  CT ABDOMEN AND PELVIS WITH CONTRAST  Technique:  Multidetector CT imaging of the abdomen and pelvis was performed following the standard protocol during bolus administration of intravenous contrast.  Contrast: 176m OMNIPAQUE IOHEXOL 300 MG/ML IV SOLN  Comparison: 12/17/2009  Findings: Clear lung bases.  Normal heart size without pericardial or pleural effusion.  Probable mild hepatic steatosis.  Normal spleen, stomach, pancreas, gallbladder, biliary tract, adrenal glands, kidneys.  A retroaortic left renal vein.  Mild retroperitoneal adenopathy.  1.1 cm left periaortic node is unchanged on image 52.  Wall thickening involves the rectum and sigmoid colon.  Distal descending colonic wall thickening.  Areas of underdistension within the splenic flexure and transverse colon. Normal terminal ileum and appendix.  Normal small bowel without abdominal ascites.  There is extensive pericolonic adenopathy.  Nodes adjacent the splenic flexure measure up to 1.1 cm on image 30 unchanged. Adenopathy within the sigmoid mesocolon measures 1.1 cm on image 72.  Similar to on the prior.  No pelvic sidewall adenopathy. Normal urinary bladder and prostate. No significant free fluid.  Partial fusion of the left sacroiliac joint is unchanged and could represent chronic sacroiliitis or be due to age advanced degenerative change.  IMPRESSION:  1.  Left-sided colitis.  Likely related to the clinical history of ulcerative colitis.  No obstruction or other  complication. 2.  Extensive pericolonic and less so retroperitoneal adenopathy. Similar since 12/17/2009, therefore likely reactive.  Original Report Authenticated By: Areta Haber, M.D.    Medications: Scheduled Meds:    . ciprofloxacin  400 mg Intravenous Q12H  . methylPREDNISolone (SOLU-MEDROL) injection  125 mg Intravenous Once  . metronidazole  500 mg  Intravenous Q8H  . ondansetron (ZOFRAN) IV  4 mg Intravenous Q6H   Or  . ondansetron  4 mg Oral Q6H  . pantoprazole (PROTONIX) IV  40 mg Intravenous Q12H  . risperiDONE  1 mg Oral Daily   Continuous Infusions:    . sodium chloride 125 mL/hr at 12/03/11 1426   PRN Meds:.acetaminophen, acetaminophen, alum & mag hydroxide-simeth, HYDROmorphone, oxyCODONE, promethazine, zolpidem  Assessment/Plan:  Principal Problem:  *ULCERATIVE COLITIS: GI consultation appreciated, provided by Dr Lucio Edward, covering for Dr Carol Ada. Patient is felt to have an acute gastroenteritis, probably of viral etiology. GI has discontinued steroids, accordingly. Currently on Ciprofloxacin/Flagyl Day# 3. Wcc has already normalized. C. Difficile PCR is negative. Supportive management continues. SBP is borderline today, so will increase ivi fluids. Active Problems:  1. HYPOKALEMIA: Secondary to above. repleting as indicated.  2. Nausea and vomiting: On antiemetics, ie Zofran/Phenergan  3. Schizophrenia: Clinically stable.     LOS: 2 days   Titus Drone,CHRISTOPHER 12/03/2011, 3:56 PM

## 2011-12-03 NOTE — Progress Notes (Signed)
Pt reported he had a stool with some blood mixed in. Flushed stool before nurse entered room. Told to call RN with next BM. Admitted understanding.

## 2011-12-03 NOTE — Progress Notes (Signed)
I have taken an interval history, reviewed the chart and examined the patient. I agree with the extender's note, impression and recommendations.  Dr. Benson Norway will assume care tomorrow.  Pricilla Riffle. Fuller Plan MD Marval Regal

## 2011-12-03 NOTE — Progress Notes (Signed)
Patient ID: [name], male   DOB: [birth year], 31 y.o.   MRN: [id number] [facility] Progress Note  Subjective: He feels a little better, still nauseated,vomited yesterday,c/o generalized abdominal discomfort. No significant diarrhea.  Objective:  Vital signs in last 24 hours: Temp:  [97.8 F (36.6 C)-99.3 F (37.4 C)] 97.8 F (36.6 C) (02/17 0651) Pulse Rate:  [51-63] 55  (02/17 0651) Resp:  [16-18] 16  (02/17 0651) BP: (116-140)/(68-91) 119/72 mmHg (02/17 0651) SpO2:  [97 %-100 %] 97 % (02/17 0651) Weight:  [195 lb 8.8 oz (88.7 kg)] 195 lb 8.8 oz (88.7 kg) (02/17 0651) Last BM Date: 12/03/11 General:   Alert,  Well-developed,    in NAD Heart:  Regular rate and rhythm; no murmurs Pulm;clear Abdomen:  Soft, mildly tender upper abdomen and nondistended. Normal bowel sounds, without guarding,  rebound   Extremities:  Without edema. Neurologic:  Alert and  oriented x4;  grossly normal neurologically. Psych:  Alert and cooperative. Normal mood and affect.  Intake/Output from previous day: 02/16 0701 - 02/17 0700 In: 2245 [P.O.:720; I.V.:1525] Out: 1 [Stool:1] Intake/Output this shift:    Lab Results:  Basename 12/03/11 0645 12/02/11 0600 12/01/11 1555  WBC 8.9 9.4 14.4*  HGB 11.4* 11.8* 14.2  HCT 35.5* 36.5* 41.8  PLT 255 301 332   BMET  Basename 12/03/11 0645 12/02/11 0600 12/01/11 1555  NA 138 141 141  K 3.3* 3.4* 3.4*  CL 104 108 104  CO2 27 27 29   GLUCOSE 93 106* 107*  BUN 8 12 14   CREATININE 0.99 0.87 0.92  CALCIUM 8.8 8.7 9.5   LFT  Basename 12/03/11 0645  PROT 6.0  ALBUMIN 2.9*  AST 9  ALT 7  ALKPHOS 57  BILITOT 0.4  BILIDIR --  IBILI --   PT/INR No results found for this basename: LABPROT:2,INR:2 in the last 72 hours Hepatitis Panel No results found for this basename: HEPBSAG,HCVAB,HEPAIGM,HEPBIGM in the last 72 hours  Assessment / Plan: #1 31 yo male with probable acute gastroenteritis/viral-  Continue suportive  management. Will advance to full liquid diet  #2 Ulcerative colitis-stable, this is not a UC exacerbation- he needs RX for Asacol on discharge ,400 mg ,4 po 3 times daily and to establish with a PCP. Principal Problem:  *ULCERATIVE COLITIS Active Problems:  HYPOKALEMIA  Ulcerative colitis  Abdominal pain, generalized  Nausea and vomiting  Diarrhea  Schizophrenia  Leukocytosis  Colitis     LOS: 2 days   Tarence Searcy  12/03/2011, 9:18 AM

## 2011-12-04 LAB — BASIC METABOLIC PANEL
BUN: 5 mg/dL — ABNORMAL LOW (ref 6–23)
Chloride: 106 mEq/L (ref 96–112)
Creatinine, Ser: 1.01 mg/dL (ref 0.50–1.35)
GFR calc Af Amer: >90 mL/min (ref >90)
GFR calc non Af Amer: >90 mL/min (ref >90)
Glucose, Bld: 75 mg/dL (ref 70–99)

## 2011-12-04 LAB — CBC
HCT: 42.2 % (ref 39.0–52.0)
Hemoglobin: 13.9 g/dL (ref 13.0–17.0)
MCHC: 32.9 g/dL (ref 30.0–36.0)
MCV: 84.7 fL (ref 78.0–100.0)
RDW: 15.1 % (ref 11.5–15.5)

## 2011-12-04 MED ORDER — INFLUENZA VIRUS VACC SPLIT PF IM SUSP
0.5000 mL | INTRAMUSCULAR | Status: AC
  Administered 2011-12-05: 0.5 mL via INTRAMUSCULAR
  Filled 2011-12-04: qty 0.5

## 2011-12-04 MED ORDER — POTASSIUM CHLORIDE 10 MEQ/100ML IV SOLN
10.0000 meq | INTRAVENOUS | Status: AC
  Administered 2011-12-04 (×4): 10 meq via INTRAVENOUS
  Filled 2011-12-04 (×4): qty 100

## 2011-12-04 NOTE — Progress Notes (Signed)
Subjective: No vomiting this AM, but nauseated. Still has diarrhea, now bloody.  Objective: Vital signs in last 24 hours: Temp:  [97.8 F (36.6 C)-98.8 F (37.1 C)] 98.3 F (36.8 C) (02/18 0605) Pulse Rate:  [47-64] 56  (02/18 0605) Resp:  [17-18] 18  (02/18 0605) BP: (95-139)/(50-87) 127/78 mmHg (02/18 0605) SpO2:  [98 %-100 %] 100 % (02/18 0605) Weight:  [88.8 kg (195 lb 12.3 oz)] 88.8 kg (195 lb 12.3 oz) (02/17 2126) Weight change: 0.1 kg (3.5 oz) Last BM Date: 12/04/11  Intake/Output from previous day: 02/17 0701 - 02/18 0700 In: 2867.5 [P.O.:1080; I.V.:1787.5] Out: 677 [Urine:675; Stool:2]     Physical Exam: General: Comfortable, alert, communicative, fully oriented, not short of breath at rest.  HEENT:  No clinical pallor, no jaundice, no conjunctival injection or discharge. Hydration status is satisfactory. NECK:  Supple, JVP not seen, no carotid bruits, no palpable lymphadenopathy, no palpable goiter. CHEST:  Clinically clear to auscultation, no wheezes, no crackles. HEART:  Sounds 1 and 2 heard, normal, regular, no murmurs. ABDOMEN:  Flat, soft, only moderate LLQ discomfort, no palpable organomegaly, no palpable masses, normal bowel sounds. GENITALIA:  Not examined. LOWER EXTREMITIES:  No pitting edema, palpable peripheral pulses. MUSCULOSKELETAL SYSTEM:  Unremarkable. CENTRAL NERVOUS SYSTEM:  No focal neurologic deficit on gross examination.  Lab Results:  Basename 12/03/11 0645 12/02/11 0600  WBC 8.9 9.4  HGB 11.4* 11.8*  HCT 35.5* 36.5*  PLT 255 301    Basename 12/03/11 0645 12/02/11 0600  NA 138 141  K 3.3* 3.4*  CL 104 108  CO2 27 27  GLUCOSE 93 106*  BUN 8 12  CREATININE 0.99 0.87  CALCIUM 8.8 8.7   Recent Results (from the past 240 hour(s))  CLOSTRIDIUM DIFFICILE BY PCR     Status: Normal   Collection Time   12/02/11  8:38 PM      Component Value Range Status Comment   C difficile by pcr NEGATIVE  NEGATIVE  Final      Studies/Results: No  results found.  Medications: Scheduled Meds:    . ciprofloxacin  400 mg Intravenous Q12H  . metronidazole  500 mg Intravenous Q8H  . ondansetron (ZOFRAN) IV  4 mg Intravenous Q6H   Or  . ondansetron  4 mg Oral Q6H  . pantoprazole (PROTONIX) IV  40 mg Intravenous Q12H  . risperiDONE  1 mg Oral Daily  . sodium chloride  500 mL Intravenous Once  . DISCONTD: methylPREDNISolone (SOLU-MEDROL) injection  125 mg Intravenous Once   Continuous Infusions:    . 0.9 % NaCl with KCl 40 mEq / L 150 mL/hr at 12/04/11 0517  . DISCONTD: sodium chloride 125 mL/hr at 12/03/11 1426   PRN Meds:.acetaminophen, acetaminophen, alum & mag hydroxide-simeth, HYDROmorphone, oxyCODONE, promethazine, zolpidem  Assessment/Plan:  Principal Problem:  *ULCERATIVE COLITIS: GI consultation appreciated, provided by Dr Lucio Edward, covering for Dr Carol Ada. Patient is felt to have an acute gastroenteritis, probably of viral etiology. GI has discontinued steroids, accordingly. Currently on Ciprofloxacin/Flagyl Day# 4. Wcc has already normalized. C. Difficile PCR is negative. As stool is now bloody, perhaps a flare of UC is once again in the differential. Supportive management continues. Dr Benson Norway plans a flexible sigmoidoscopy on 12/05/11.Marland Kitchen Active Problems:  1. HYPOKALEMIA: Secondary to above. repleting as indicated.  2. Nausea and vomiting: On antiemetics, ie Zofran/Phenergan.  3. Schizophrenia: Clinically stable.  Disp# Per GI.   LOS: 3 days   Harjot Dibello,CHRISTOPHER 12/04/2011, 8:36 AM

## 2011-12-04 NOTE — Progress Notes (Signed)
Patient ID: [name], male   DOB: Mar 31, 1981, 31 y.o.   MRN: [id number] Subjective: He reports feeling badly again.  The pain, nausea, and vomiting have returned.  Objective: Vital signs in last 24 hours: Temp:  [97.8 F (36.6 C)-98.8 F (37.1 C)] 98.3 F (36.8 C) (02/18 0605) Pulse Rate:  [47-64] 56  (02/18 0605) Resp:  [17-18] 18  (02/18 0605) BP: (95-139)/(50-87) 127/78 mmHg (02/18 0605) SpO2:  [98 %-100 %] 100 % (02/18 0605) Weight:  [88.8 kg (195 lb 12.3 oz)] 88.8 kg (195 lb 12.3 oz) (02/17 2126) Last BM Date: 12/03/11  Intake/Output from previous day: 02/17 0701 - 02/18 0700 In: 2867.5 [P.O.:1080; I.V.:1787.5] Out: 675 [Urine:675] Intake/Output this shift:    General appearance: alert and no distress GI: lower abdominal pain  Lab Results:  Basename 12/03/11 0645 12/02/11 0600 12/01/11 1555  WBC 8.9 9.4 14.4*  HGB 11.4* 11.8* 14.2  HCT 35.5* 36.5* 41.8  PLT 255 301 332   BMET  Basename 12/03/11 0645 12/02/11 0600 12/01/11 1555  NA 138 141 141  K 3.3* 3.4* 3.4*  CL 104 108 104  CO2 27 27 29   GLUCOSE 93 106* 107*  BUN 8 12 14   CREATININE 0.99 0.87 0.92  CALCIUM 8.8 8.7 9.5   LFT  Basename 12/03/11 0645  PROT 6.0  ALBUMIN 2.9*  AST 9  ALT 7  ALKPHOS 57  BILITOT 0.4  BILIDIR --  IBILI --   PT/INR No results found for this basename: LABPROT:2,INR:2 in the last 72 hours Hepatitis Panel No results found for this basename: HEPBSAG,HCVAB,HEPAIGM,HEPBIGM in the last 72 hours C-Diff No results found for this basename: CDIFFTOX:3 in the last 72 hours Fecal Lactopherrin No results found for this basename: FECLLACTOFRN in the last 72 hours  Studies/Results: No results found.  Medications:  Scheduled:   . ciprofloxacin  400 mg Intravenous Q12H  . methylPREDNISolone (SOLU-MEDROL) injection  125 mg Intravenous Once  . metronidazole  500 mg Intravenous Q8H  . ondansetron (ZOFRAN) IV  4 mg Intravenous Q6H   Or  . ondansetron  4 mg Oral Q6H  .  pantoprazole (PROTONIX) IV  40 mg Intravenous Q12H  . risperiDONE  1 mg Oral Daily  . sodium chloride  500 mL Intravenous Once   Continuous:   . 0.9 % NaCl with KCl 40 mEq / L 150 mL/hr at 12/04/11 0517  . DISCONTD: sodium chloride 125 mL/hr at 12/03/11 1426    Assessment/Plan: 1) History of UC   I think it will be worthwhile to perform a FFS tomorrow since his CT scan shows that there is abnormality in the distal colon.  Plan: 1) FFS tomorrow. 2) Continue with Asacol.  LOS: 3 days   Monique Gift D 12/04/2011, 7:14 AM

## 2011-12-04 NOTE — Progress Notes (Signed)
Utilization Review Completed.[name] T2/18/2013

## 2011-12-05 ENCOUNTER — Encounter (HOSPITAL_COMMUNITY): Payer: Self-pay

## 2011-12-05 ENCOUNTER — Encounter (HOSPITAL_COMMUNITY)
Admission: EM | Disposition: A | Discharge status: Home / Self Care | Payer: Self-pay | Source: Ambulatory Visit | Attending: Internal Medicine

## 2011-12-05 ENCOUNTER — Other Ambulatory Visit: Payer: Self-pay | Admitting: Gastroenterology

## 2011-12-05 HISTORY — PX: FLEXIBLE SIGMOIDOSCOPY: SHX5431

## 2011-12-05 LAB — BASIC METABOLIC PANEL
BUN: 3 mg/dL — ABNORMAL LOW (ref 6–23)
Chloride: 107 mEq/L (ref 96–112)
Glucose, Bld: 103 mg/dL — ABNORMAL HIGH (ref 70–99)
Potassium: 4.1 mEq/L (ref 3.5–5.1)

## 2011-12-05 LAB — CBC
HCT: 42.5 % (ref 39.0–52.0)
Hemoglobin: 14.4 g/dL (ref 13.0–17.0)
RBC: 5.02 MIL/uL (ref 4.22–5.81)
WBC: 15.7 10*3/uL — ABNORMAL HIGH (ref 4.0–10.5)

## 2011-12-05 SURGERY — SIGMOIDOSCOPY, FLEXIBLE
Anesthesia: Moderate Sedation

## 2011-12-05 MED ORDER — SODIUM CHLORIDE 0.9 % IV SOLN: Freq: Once | INTRAVENOUS | Status: DC

## 2011-12-05 MED ORDER — PREDNISONE 20 MG PO TABS
20.0000 mg | ORAL_TABLET | Freq: Two times a day (BID) | ORAL | Status: DC
  Administered 2011-12-05 – 2011-12-09 (×8): 20 mg via ORAL
  Filled 2011-12-05 (×11): qty 1

## 2011-12-05 MED ORDER — FLEET ENEMA 7-19 GM/118ML RE ENEM
2.0000 | ENEMA | Freq: Once | RECTAL | Status: AC
  Administered 2011-12-05: 2 via RECTAL
  Filled 2011-12-05: qty 2

## 2011-12-05 MED ORDER — MIDAZOLAM HCL 10 MG/2ML IJ SOLN
INTRAMUSCULAR | Status: DC | PRN
  Administered 2011-12-05 (×2): 2 mg via INTRAVENOUS

## 2011-12-05 MED ORDER — FENTANYL CITRATE 0.05 MG/ML IJ SOLN
INTRAMUSCULAR | Status: DC | PRN
  Administered 2011-12-05 (×2): 25 ug via INTRAVENOUS

## 2011-12-05 MED ORDER — MESALAMINE 400 MG PO TBEC
800.0000 mg | DELAYED_RELEASE_TABLET | Freq: Three times a day (TID) | ORAL | Status: DC
  Administered 2011-12-05 – 2011-12-16 (×33): 800 mg via ORAL
  Filled 2011-12-05 (×39): qty 2

## 2011-12-05 NOTE — Op Note (Signed)
[facility] [place], [place]  [id number]  FLEXIBLE SIGMOIDOSCOPY PROCEDURE REPORT  PATIENT:  Keith, Brennan  MR#:  [id number] BIRTHDATE:  [birth year], 31 yrs. old  GENDER:  male ENDOSCOPIST:  Carol Ada, MD PROCEDURE DATE:  12/05/2011 PROCEDURE:  Flexible Sigmoidoscopy with biopsy ASA CLASS:  Class III INDICATIONS:  Hematochezia and history of UC MEDICATIONS:   Fentanyl 50 mcg IV, Versed 4 mg IV  DESCRIPTION OF PROCEDURE:   After the risks benefits and alternatives of the procedure were thoroughly explained, informed consent was obtained.  Digital rectal exam was performed and revealed no abnormalities.   The [id number] ([id number]) endoscope was introduced through the anus and advanced to the splenic flexure, without limitations.  The quality of the prep was good.  The instrument was then slowly withdrawn as the mucosa was fully examined. <<PROCEDUREIMAGES>>  FINDINGS A severe colitis was identified from the dentate line up to the splenic flexure region. Cold biopsies were obtained. Proximal to the splenic flexure the colonic mucosal was normal. No other abnormalitied identified.   Retroflexed views in the rectum revealed medium hemorrhoids.    The scope was then withdrawn from the patient and the procedure terminated.  COMPLICATIONS:  None  ENDOSCOPIC IMPRESSION: 1) Colitis 2) Medium hemorrhoids RECOMMENDATIONS: 1) Start Asacol 4.8 grams per day.  (I reviewed the records and he was not on this medication even though it was mentioned in the notes. 2) Start steroids. 3) D/C antibiotics.  ______________________________ Carol Ada, MD  n. Lorrin MaisCarol Ada at 12/05/2011 12:56 PM  Osker Mason, [phone]

## 2011-12-05 NOTE — H&P (View-Only) (Signed)
Patient ID: [name], male   DOB: 1981-03-12, 31 y.o.   MRN: [id number] Subjective: He reports feeling badly again.  The pain, nausea, and vomiting have returned.  Objective: Vital signs in last 24 hours: Temp:  [97.8 F (36.6 C)-98.8 F (37.1 C)] 98.3 F (36.8 C) (02/18 0605) Pulse Rate:  [47-64] 56  (02/18 0605) Resp:  [17-18] 18  (02/18 0605) BP: (95-139)/(50-87) 127/78 mmHg (02/18 0605) SpO2:  [98 %-100 %] 100 % (02/18 0605) Weight:  [88.8 kg (195 lb 12.3 oz)] 88.8 kg (195 lb 12.3 oz) (02/17 2126) Last BM Date: 12/03/11  Intake/Output from previous day: 02/17 0701 - 02/18 0700 In: 2867.5 [P.O.:1080; I.V.:1787.5] Out: 675 [Urine:675] Intake/Output this shift:    General appearance: alert and no distress GI: lower abdominal pain  Lab Results:  Basename 12/03/11 0645 12/02/11 0600 12/01/11 1555  WBC 8.9 9.4 14.4*  HGB 11.4* 11.8* 14.2  HCT 35.5* 36.5* 41.8  PLT 255 301 332   BMET  Basename 12/03/11 0645 12/02/11 0600 12/01/11 1555  NA 138 141 141  K 3.3* 3.4* 3.4*  CL 104 108 104  CO2 27 27 29   GLUCOSE 93 106* 107*  BUN 8 12 14   CREATININE 0.99 0.87 0.92  CALCIUM 8.8 8.7 9.5   LFT  Basename 12/03/11 0645  PROT 6.0  ALBUMIN 2.9*  AST 9  ALT 7  ALKPHOS 57  BILITOT 0.4  BILIDIR --  IBILI --   PT/INR No results found for this basename: LABPROT:2,INR:2 in the last 72 hours Hepatitis Panel No results found for this basename: HEPBSAG,HCVAB,HEPAIGM,HEPBIGM in the last 72 hours C-Diff No results found for this basename: CDIFFTOX:3 in the last 72 hours Fecal Lactopherrin No results found for this basename: FECLLACTOFRN in the last 72 hours  Studies/Results: No results found.  Medications:  Scheduled:   . ciprofloxacin  400 mg Intravenous Q12H  . methylPREDNISolone (SOLU-MEDROL) injection  125 mg Intravenous Once  . metronidazole  500 mg Intravenous Q8H  . ondansetron (ZOFRAN) IV  4 mg Intravenous Q6H   Or  . ondansetron  4 mg Oral Q6H  .  pantoprazole (PROTONIX) IV  40 mg Intravenous Q12H  . risperiDONE  1 mg Oral Daily  . sodium chloride  500 mL Intravenous Once   Continuous:   . 0.9 % NaCl with KCl 40 mEq / L 150 mL/hr at 12/04/11 0517  . DISCONTD: sodium chloride 125 mL/hr at 12/03/11 1426    Assessment/Plan: 1) History of UC   I think it will be worthwhile to perform a FFS tomorrow since his CT scan shows that there is abnormality in the distal colon.  Plan: 1) FFS tomorrow. 2) Continue with Asacol.  LOS: 3 days   Tenika Keeran D 12/04/2011, 7:14 AM

## 2011-12-05 NOTE — Interval H&P Note (Signed)
History and Physical Interval Note:  12/05/2011 12:30 PM  [name]  has presented today for surgery, with the diagnosis of History of UC  The various methods of treatment have been discussed with the patient and family. After consideration of risks, benefits and other options for treatment, the patient has consented to  Procedure(s) (LRB): FLEXIBLE SIGMOIDOSCOPY (N/A) as a surgical intervention .  The patients' history has been reviewed, patient examined, no change in status, stable for surgery.  I have reviewed the patients' chart and labs.  Questions were answered to the patient's satisfaction.     [name]

## 2011-12-05 NOTE — Progress Notes (Signed)
Subjective: Had flexible sigmoidoscopy today..  Objective: Vital signs in last 24 hours: Temp:  [98.3 F (36.8 C)-99.1 F (37.3 C)] 99 F (37.2 C) (02/19 1703) Pulse Rate:  [70-80] 73  (02/19 1703) Resp:  [12-67] 18  (02/19 1703) BP: (107-140)/(24-87) 124/64 mmHg (02/19 1703) SpO2:  [94 %-100 %] 97 % (02/19 1703) Weight:  [88.6 kg (195 lb 5.2 oz)] 88.6 kg (195 lb 5.2 oz) (02/18 2014) Weight change: -0.2 kg (-7.1 oz) Last BM Date: 12/05/11  Intake/Output from previous day: 02/18 0701 - 02/19 0700 In: 2407.5 [P.O.:360; I.V.:1337.5; IV Piggyback:710] Out: -  Total I/O In: 3662 [P.O.:120; I.V.:3500] Out: -    Physical Exam: General: Comfortable, alert, communicative, fully oriented, not short of breath at rest.  HEENT:  No clinical pallor, no jaundice, no conjunctival injection or discharge. Hydration status is satisfactory. NECK:  Supple, JVP not seen, no carotid bruits, no palpable lymphadenopathy, no palpable goiter. CHEST:  Clinically clear to auscultation, no wheezes, no crackles. HEART:  Sounds 1 and 2 heard, normal, regular, no murmurs. ABDOMEN:  Flat, soft, only moderate LLQ discomfort, no palpable organomegaly, no palpable masses, normal bowel sounds. GENITALIA:  Not examined. LOWER EXTREMITIES:  No pitting edema, palpable peripheral pulses. MUSCULOSKELETAL SYSTEM:  Unremarkable. CENTRAL NERVOUS SYSTEM:  No focal neurologic deficit on gross examination.  Lab Results:  Basename 12/05/11 0600 12/04/11 0735  WBC 15.7* 13.1*  HGB 14.4 13.9  HCT 42.5 42.2  PLT 341 306    Basename 12/05/11 0600 12/04/11 0735  NA 141 142  K 4.1 3.4*  CL 107 106  CO2 23 27  GLUCOSE 103* 75  BUN 3* 5*  CREATININE 1.09 1.01  CALCIUM 8.8 9.0   Recent Results (from the past 240 hour(s))  CLOSTRIDIUM DIFFICILE BY PCR     Status: Normal   Collection Time   12/02/11  8:38 PM      Component Value Range Status Comment   C difficile by pcr NEGATIVE  NEGATIVE  Final       Studies/Results: No results found.  Medications: Scheduled Meds:    . sodium chloride   Intravenous Once  . influenza  inactive virus vaccine  0.5 mL Intramuscular Tomorrow-1000  . mesalamine  800 mg Oral TID  . ondansetron (ZOFRAN) IV  4 mg Intravenous Q6H   Or  . ondansetron  4 mg Oral Q6H  . pantoprazole (PROTONIX) IV  40 mg Intravenous Q12H  . predniSONE  20 mg Oral BID WC  . risperiDONE  1 mg Oral Daily  . sodium phosphate  2 enema Rectal Once  . DISCONTD: ciprofloxacin  400 mg Intravenous Q12H  . DISCONTD: metronidazole  500 mg Intravenous Q8H   Continuous Infusions:    . 0.9 % NaCl with KCl 40 mEq / L 150 mL/hr at 12/05/11 0831   PRN Meds:.acetaminophen, acetaminophen, alum & mag hydroxide-simeth, HYDROmorphone, oxyCODONE, promethazine, zolpidem, DISCONTD: fentaNYL, DISCONTD: midazolam  Assessment/Plan:  Principal Problem:  *ULCERATIVE COLITIS: GI consultation was provided by Dr Lucio Edward (covering for Dr Carol Ada), and he initially felt that patient may have an acute gastroenteritis, probably of viral etiology. GI then discontinued steroids, but continued Ciprofloxacin/Flagyl Day# 5. C. Difficile PCR was negative. As stool became bloody on 12/05/11, flare of UC was placed once again in the differential. Dr Benson Norway performed a flexible sigmoidoscopy on 12/05/11, which revealed a severe colitis from the dentate line up to the splenic flexure region. Cold biopsies were obtained. Retroflexed views in the rectum revealed medium  hemorrhoids. Antibiotics were discontinued on 12/05/11, and steroids have been recommenced. Asacol continues, as well as supportive measures. Active Problems:  1. HYPOKALEMIA: Secondary to above. repleting as indicated.  2. Nausea and vomiting: On antiemetics, ie Zofran/Phenergan.  3. Schizophrenia: Clinically stable.  Disp# Per GI.   LOS: 4 days   [name],CHRISTOPHER 12/05/2011, 5:16 PM

## 2011-12-06 ENCOUNTER — Encounter (HOSPITAL_COMMUNITY): Payer: Self-pay | Admitting: Gastroenterology

## 2011-12-06 LAB — BASIC METABOLIC PANEL
CO2: 28 mEq/L (ref 19–32)
Calcium: 8.7 mg/dL (ref 8.4–10.5)
Chloride: 105 mEq/L (ref 96–112)
Creatinine, Ser: 0.95 mg/dL (ref 0.50–1.35)
Glucose, Bld: 94 mg/dL (ref 70–99)
Sodium: 138 mEq/L (ref 135–145)

## 2011-12-06 LAB — CBC
Hemoglobin: 13.3 g/dL (ref 13.0–17.0)
MCH: 27.9 pg (ref 26.0–34.0)
MCV: 84 fL (ref 78.0–100.0)
RBC: 4.76 MIL/uL (ref 4.22–5.81)
WBC: 11.6 10*3/uL — ABNORMAL HIGH (ref 4.0–10.5)

## 2011-12-06 NOTE — Progress Notes (Signed)
Pt refusing to wear scd hose

## 2011-12-06 NOTE — Progress Notes (Signed)
Subjective:  Continues to have abdominal pain and diarrhea   Objective: Vital signs in last 24 hours: Temp:  [98.5 F (36.9 C)-99 F (37.2 C)] 98.7 F (37.1 C) (02/20 0957) Pulse Rate:  [63-75] 70  (02/20 0957) Resp:  [18-20] 19  (02/20 0957) BP: (124-132)/(63-84) 124/70 mmHg (02/20 0957) SpO2:  [91 %-100 %] 91 % (02/20 0957) Weight change:  Last BM Date: 12/06/11  Intake/Output from previous day: 02/19 0701 - 02/20 0700 In: 3620 [P.O.:120; I.V.:3500] Out: -  Total I/O In: 120 [P.O.:120] Out: 2 [Urine:1; Stool:1]   Physical Exam: Alert and oriented x3 CVS: RRR RS; CTAB Abdomen : soft, NT    Lab Results:  Basename 12/06/11 0545 12/05/11 0600  WBC 11.6* 15.7*  HGB 13.3 14.4  HCT 40.0 42.5  PLT 327 341    Basename 12/06/11 0545 12/05/11 0600  NA 138 141  K 3.9 4.1  CL 105 107  CO2 28 23  GLUCOSE 94 103*  BUN 4* 3*  CREATININE 0.95 1.09  CALCIUM 8.7 8.8   Recent Results (from the past 240 hour(s))  CLOSTRIDIUM DIFFICILE BY PCR     Status: Normal   Collection Time   12/02/11  8:38 PM      Component Value Range Status Comment   C difficile by pcr NEGATIVE  NEGATIVE  Final      Studies/Results: No results found.  Medications: Scheduled Meds:    . sodium chloride   Intravenous Once  . influenza  inactive virus vaccine  0.5 mL Intramuscular Tomorrow-1000  . mesalamine  800 mg Oral TID  . ondansetron (ZOFRAN) IV  4 mg Intravenous Q6H   Or  . ondansetron  4 mg Oral Q6H  . pantoprazole (PROTONIX) IV  40 mg Intravenous Q12H  . predniSONE  20 mg Oral BID WC  . risperiDONE  1 mg Oral Daily   Continuous Infusions:    . 0.9 % NaCl with KCl 40 mEq / L 150 mL/hr at 12/06/11 0858   PRN Meds:.acetaminophen, acetaminophen, alum & mag hydroxide-simeth, HYDROmorphone, oxyCODONE, promethazine, zolpidem  Assessment/Plan:  Principal Problem:  *ULCERATIVE COLITIS: GI consultation was provided by Dr Lucio Edward (covering for Dr Carol Ada), and he  initially felt that patient may have an acute gastroenteritis, probably of viral etiology. GI then discontinued steroids, but continued Ciprofloxacin/Flagyl Day# 5. C. Difficile PCR was negative. As stool became bloody on 12/05/11, flare of UC was placed once again in the differential. Dr Benson Norway performed a flexible sigmoidoscopy on 12/05/11, which revealed a severe colitis from the dentate line up to the splenic flexure region. Cold biopsies were obtained. Retroflexed views in the rectum revealed medium hemorrhoids. Antibiotics were discontinued on 12/05/11, and steroids have been recommenced. Asacol continues, as well as supportive measures. Active Problems:  1. HYPOKALEMIA: Secondary to above. repleting as indicated.  2. Nausea and vomiting: On antiemetics, ie Zofran/Phenergan.  3. Schizophrenia: Clinically stable.  Disp# Per GI.   LOS: 5 days   [name] 12/06/2011, 1:26 PM

## 2011-12-06 NOTE — Progress Notes (Signed)
Patient ID: [name], male   DOB: 1981/01/15, 31 y.o.   MRN: [id number] Subjective: Feeling better.  No acute events overnight.  Objective: Vital signs in last 24 hours: Temp:  [98.5 F (36.9 C)-99.1 F (37.3 C)] 98.5 F (36.9 C) (02/20 0551) Pulse Rate:  [63-75] 63  (02/20 0551) Resp:  [12-67] 18  (02/20 0551) BP: (107-140)/(24-87) 131/73 mmHg (02/20 0551) SpO2:  [95 %-100 %] 98 % (02/20 0551) Last BM Date: 12/05/11  Intake/Output from previous day: 02/19 0701 - 02/20 0700 In: 3620 [P.O.:120; I.V.:3500] Out: -  Intake/Output this shift:    General appearance: alert and no distress GI: soft, non-tender; bowel sounds normal; no masses,  no organomegaly  Lab Results:  Basename 12/06/11 0545 12/05/11 0600 12/04/11 0735  WBC 11.6* 15.7* 13.1*  HGB 13.3 14.4 13.9  HCT 40.0 42.5 42.2  PLT 327 341 306   BMET  Basename 12/05/11 0600 12/04/11 0735  NA 141 142  K 4.1 3.4*  CL 107 106  CO2 23 27  GLUCOSE 103* 75  BUN 3* 5*  CREATININE 1.09 1.01  CALCIUM 8.8 9.0   LFT No results found for this basename: PROT,ALBUMIN,AST,ALT,ALKPHOS,BILITOT,BILIDIR,IBILI in the last 72 hours PT/INR No results found for this basename: LABPROT:2,INR:2 in the last 72 hours Hepatitis Panel No results found for this basename: HEPBSAG,HCVAB,HEPAIGM,HEPBIGM in the last 72 hours C-Diff No results found for this basename: CDIFFTOX:3 in the last 72 hours Fecal Lactopherrin No results found for this basename: FECLLACTOFRN in the last 72 hours  Studies/Results: No results found.  Medications:  Scheduled:   . sodium chloride   Intravenous Once  . influenza  inactive virus vaccine  0.5 mL Intramuscular Tomorrow-1000  . mesalamine  800 mg Oral TID  . ondansetron (ZOFRAN) IV  4 mg Intravenous Q6H   Or  . ondansetron  4 mg Oral Q6H  . pantoprazole (PROTONIX) IV  40 mg Intravenous Q12H  . predniSONE  20 mg Oral BID WC  . risperiDONE  1 mg Oral Daily  . sodium phosphate  2 enema Rectal  Once  . DISCONTD: ciprofloxacin  400 mg Intravenous Q12H  . DISCONTD: metronidazole  500 mg Intravenous Q8H   Continuous:   . 0.9 % NaCl with KCl 40 mEq / L 150 mL/hr (12/06/11 0059)    Assessment/Plan: 1) Severe left sided Ulcerative Colitis.   Clinically he appears to be improved today.  Plan: 1) Continue with prednisone and Asacol.  LOS: 5 days   Tita Terhaar D 12/06/2011, 6:56 AM

## 2011-12-07 NOTE — Progress Notes (Signed)
Utilization Review Completed.[name] T2/21/2013

## 2011-12-07 NOTE — Discharge Instructions (Signed)
[facility] Eligibility Appointment:  01/04/12 @ 2:15 p.m. Appointment with Dr. Jarold Song:  01/10/12 @ 10:30 a.m.

## 2011-12-07 NOTE — Progress Notes (Signed)
Subjective:  Slightly better   Objective: Vital signs in last 24 hours: Temp:  [97.7 F (36.5 C)-98.5 F (36.9 C)] 98.5 F (36.9 C) (02/21 1300) Pulse Rate:  [65-99] 99  (02/21 1300) Resp:  [16-20] 20  (02/21 1300) BP: (120-138)/(66-87) 137/79 mmHg (02/21 1300) SpO2:  [98 %-99 %] 98 % (02/21 1300) Weight change:  Last BM Date: 12/07/11  Intake/Output from previous day: 02/20 0701 - 02/21 0700 In: 4476 [P.O.:1076; I.V.:3400] Out: 72 [Urine:851; Stool:1] Total I/O In: 1707.5 [P.O.:720; I.V.:977.5; IV Piggyback:10] Out: -    Physical Exam: Alert and oriented x3 CVS: RRR RS; CTAB Abdomen : soft, NT    Lab Results:  Basename 12/06/11 0545 12/05/11 0600  WBC 11.6* 15.7*  HGB 13.3 14.4  HCT 40.0 42.5  PLT 327 341    Basename 12/06/11 0545 12/05/11 0600  NA 138 141  K 3.9 4.1  CL 105 107  CO2 28 23  GLUCOSE 94 103*  BUN 4* 3*  CREATININE 0.95 1.09  CALCIUM 8.7 8.8   Recent Results (from the past 240 hour(s))  CLOSTRIDIUM DIFFICILE BY PCR     Status: Normal   Collection Time   12/02/11  8:38 PM      Component Value Range Status Comment   C difficile by pcr NEGATIVE  NEGATIVE  Final      Studies/Results: No results found.  Medications: Scheduled Meds:    . sodium chloride   Intravenous Once  . mesalamine  800 mg Oral TID  . ondansetron (ZOFRAN) IV  4 mg Intravenous Q6H   Or  . ondansetron  4 mg Oral Q6H  . predniSONE  20 mg Oral BID WC  . risperiDONE  1 mg Oral Daily  . DISCONTD: pantoprazole (PROTONIX) IV  40 mg Intravenous Q12H   Continuous Infusions:    . 0.9 % NaCl with KCl 40 mEq / L 150 mL/hr at 12/07/11 1354   PRN Meds:.acetaminophen, acetaminophen, alum & mag hydroxide-simeth, HYDROmorphone, oxyCODONE, promethazine, zolpidem  Assessment/Plan:  Principal Problem:  *ULCERATIVE COLITIS: GI consultation was provided by Dr Lucio Edward (covering for Dr Carol Ada), and he initially felt that patient may have an acute gastroenteritis,  probably of viral etiology. GI then discontinued steroids, but continued Ciprofloxacin/Flagyl Day# 5. C. Difficile PCR was negative. As stool became bloody on 12/05/11, flare of UC was placed once again in the differential. Dr Benson Norway performed a flexible sigmoidoscopy on 12/05/11, which revealed a severe colitis from the dentate line up to the splenic flexure region. Cold biopsies were obtained. Retroflexed views in the rectum revealed medium hemorrhoids. Antibiotics were discontinued on 12/05/11, and steroids have been recommenced. Asacol continues, as well as supportive measures. Active Problems:  1. HYPOKALEMIA: Secondary to above. repleting as indicated.  2. Nausea and vomiting: On antiemetics, ie Zofran/Phenergan.  3. Schizophrenia: Clinically stable.  Disp# Per GI.   LOS: 6 days   Clora Ohmer 12/07/2011, 5:03 PM

## 2011-12-07 NOTE — Progress Notes (Signed)
Patient ID: [name], male   DOB: 01/28/1981, 31 y.o.   MRN: [id number] Subjective: Thirteen bowel movements yesterday, but the thinks that they stools are forming.  Pain is a 6/10.  Yesterday it was a 9/10.  Objective: Vital signs in last 24 hours: Temp:  [97.7 F (36.5 C)-98.7 F (37.1 C)] 97.7 F (36.5 C) (02/21 0602) Pulse Rate:  [65-83] 65  (02/21 0602) Resp:  [16-20] 16  (02/21 0602) BP: (118-125)/(66-79) 120/66 mmHg (02/21 0602) SpO2:  [91 %-99 %] 99 % (02/21 0602) Last BM Date: 12/06/11  Intake/Output from previous day: 02/20 0701 - 02/21 0700 In: 1916 [P.O.:716; I.V.:1200] Out: 2 [Urine:1; Stool:1] Intake/Output this shift: Total I/O In: 236 [P.O.:236] Out: -   General appearance: alert and no distress GI: soft, non-tender; bowel sounds normal; no masses,  no organomegaly  Lab Results:  Basename 12/06/11 0545 12/05/11 0600 12/04/11 0735  WBC 11.6* 15.7* 13.1*  HGB 13.3 14.4 13.9  HCT 40.0 42.5 42.2  PLT 327 341 306   BMET  Basename 12/06/11 0545 12/05/11 0600 12/04/11 0735  NA 138 141 142  K 3.9 4.1 3.4*  CL 105 107 106  CO2 28 23 27   GLUCOSE 94 103* 75  BUN 4* 3* 5*  CREATININE 0.95 1.09 1.01  CALCIUM 8.7 8.8 9.0   LFT No results found for this basename: PROT,ALBUMIN,AST,ALT,ALKPHOS,BILITOT,BILIDIR,IBILI in the last 72 hours PT/INR No results found for this basename: LABPROT:2,INR:2 in the last 72 hours Hepatitis Panel No results found for this basename: HEPBSAG,HCVAB,HEPAIGM,HEPBIGM in the last 72 hours C-Diff No results found for this basename: CDIFFTOX:3 in the last 72 hours Fecal Lactopherrin No results found for this basename: FECLLACTOFRN in the last 72 hours  Studies/Results: No results found.  Medications:  Scheduled:   . sodium chloride   Intravenous Once  . mesalamine  800 mg Oral TID  . ondansetron (ZOFRAN) IV  4 mg Intravenous Q6H   Or  . ondansetron  4 mg Oral Q6H  . pantoprazole (PROTONIX) IV  40 mg Intravenous Q12H    . predniSONE  20 mg Oral BID WC  . risperiDONE  1 mg Oral Daily   Continuous:   . 0.9 % NaCl with KCl 40 mEq / L 150 mL/hr (12/07/11 0523)    Assessment/Plan: 1) Severe left sided Ulcerative Colitis.    Improving slowly.    Plan:  1) Continue with prednisone and Asacol.   LOS: 6 days   Tyrese Capriotti D 12/07/2011, 6:31 AM

## 2011-12-08 NOTE — Progress Notes (Signed)
Patient ID: [name], male   DOB: 03/11/1981, 31 y.o.   MRN: [id number] Subjective: Feeling better.  Less diarrhea and he thinks that his stools are forming.  8 BM last evening.  Objective: Vital signs in last 24 hours: Temp:  [98.2 F (36.8 C)-98.5 F (36.9 C)] 98.2 F (36.8 C) (02/22 0444) Pulse Rate:  [81-99] 82  (02/22 0444) Resp:  [18-20] 18  (02/22 0444) BP: (128-145)/(72-87) 128/72 mmHg (02/22 0444) SpO2:  [93 %-99 %] 94 % (02/22 0444) Weight:  [86.183 kg (190 lb)] 86.183 kg (190 lb) (02/21 2256) Last BM Date: 12/07/11  Intake/Output from previous day: 02/21 0701 - 02/22 0700 In: 2297.5 [P.O.:1200; I.V.:1087.5; IV Piggyback:10] Out: -  Intake/Output this shift:    General appearance: alert and no distress  Lab Results:  Basename 12/06/11 0545  WBC 11.6*  HGB 13.3  HCT 40.0  PLT 327   BMET  Basename 12/06/11 0545  NA 138  K 3.9  CL 105  CO2 28  GLUCOSE 94  BUN 4*  CREATININE 0.95  CALCIUM 8.7   LFT No results found for this basename: PROT,ALBUMIN,AST,ALT,ALKPHOS,BILITOT,BILIDIR,IBILI in the last 72 hours PT/INR No results found for this basename: LABPROT:2,INR:2 in the last 72 hours Hepatitis Panel No results found for this basename: HEPBSAG,HCVAB,HEPAIGM,HEPBIGM in the last 72 hours C-Diff No results found for this basename: CDIFFTOX:3 in the last 72 hours Fecal Lactopherrin No results found for this basename: FECLLACTOFRN in the last 72 hours  Studies/Results: No results found.  Medications:  Scheduled:   . sodium chloride   Intravenous Once  . mesalamine  800 mg Oral TID  . ondansetron (ZOFRAN) IV  4 mg Intravenous Q6H   Or  . ondansetron  4 mg Oral Q6H  . predniSONE  20 mg Oral BID WC  . risperiDONE  1 mg Oral Daily  . DISCONTD: pantoprazole (PROTONIX) IV  40 mg Intravenous Q12H   Continuous:   . 0.9 % NaCl with KCl 40 mEq / L 10 mL/hr at 12/08/11 0600    Assessment/Plan: 1) Severe ulcerative colitis.   Please note, the  patient was in the restroom when I rounded on him.  I was not able to perform a physical examination, but he reports that he is improving.    Plan: 1) Continue with steroids. 2) Continue with Asacol.  LOS: 7 days   Swade Shonka D 12/08/2011, 9:32 AM

## 2011-12-08 NOTE — Progress Notes (Signed)
Subjective: Still reports 6/10 pain and bloody diarrhea  Objective: Vital signs in last 24 hours: Temp:  [97.9 F (36.6 C)-98.7 F (37.1 C)] 97.9 F (36.6 C) (02/22 2152) Pulse Rate:  [74-103] 103  (02/22 2152) Resp:  [17-21] 18  (02/22 2152) BP: (115-147)/(65-80) 115/65 mmHg (02/22 2152) SpO2:  [92 %-97 %] 97 % (02/22 2152) Weight:  [86.183 kg (190 lb)-86.864 kg (191 lb 8 oz)] 86.864 kg (191 lb 8 oz) (02/22 1933) Weight change:  Last BM Date: 12/07/11  Intake/Output from previous day: 02/21 0701 - 02/22 0700 In: 2297.5 [P.O.:1200; I.V.:1087.5; IV Piggyback:10] Out: -      Physical Exam: Alert and oriented x3 CVS: RRR RS; CTAB Abdomen : soft, minimal tenderness   Lab Results:  Basename 12/06/11 0545  WBC 11.6*  HGB 13.3  HCT 40.0  PLT 327    Basename 12/06/11 0545  NA 138  K 3.9  CL 105  CO2 28  GLUCOSE 94  BUN 4*  CREATININE 0.95  CALCIUM 8.7   Recent Results (from the past 240 hour(s))  CLOSTRIDIUM DIFFICILE BY PCR     Status: Normal   Collection Time   12/02/11  8:38 PM      Component Value Range Status Comment   C difficile by pcr NEGATIVE  NEGATIVE  Final      Studies/Results: No results found.  Medications: Scheduled Meds:    . sodium chloride   Intravenous Once  . mesalamine  800 mg Oral TID  . ondansetron (ZOFRAN) IV  4 mg Intravenous Q6H   Or  . ondansetron  4 mg Oral Q6H  . predniSONE  20 mg Oral BID WC  . risperiDONE  1 mg Oral Daily   Continuous Infusions:    . 0.9 % NaCl with KCl 40 mEq / L 10 mL/hr (12/08/11 1000)   PRN Meds:.acetaminophen, acetaminophen, alum & mag hydroxide-simeth, HYDROmorphone, oxyCODONE, promethazine, zolpidem  Assessment/Plan:  Principal Problem:  *ULCERATIVE COLITIS: GI consultation was provided by Dr [name] (covering for Dr [name]), and he initially felt that patient may have an acute gastroenteritis, probably of viral etiology. GI then discontinued steroids, but continued  Ciprofloxacin/Flagyl Day# 5. C. Difficile PCR was negative. As stool became bloody on 12/05/11, flare of UC was placed once again in the differential. Dr [name] performed a flexible sigmoidoscopy on 12/05/11, which revealed a severe colitis from the dentate line up to the splenic flexure region. Cold biopsies were obtained. Retroflexed views in the rectum revealed medium hemorrhoids. Antibiotics were discontinued on 12/05/11, and steroids have been recommenced. Asacol continues, as well as supportive measures. Active Problems:  1. HYPOKALEMIA: Secondary to above. repleting as indicated.  2. Nausea and vomiting: On antiemetics, ie Zofran/Phenergan.  3. Schizophrenia: Clinically stable.  Disp# Per GI.   LOS: 7 days   [name] 12/08/2011, 10:36 PM

## 2011-12-09 LAB — CBC
Hemoglobin: 14.4 g/dL (ref 13.0–17.0)
MCH: 28.5 pg (ref 26.0–34.0)
MCHC: 33.6 g/dL (ref 30.0–36.0)
MCV: 84.6 fL (ref 78.0–100.0)
RBC: 5.06 MIL/uL (ref 4.22–5.81)

## 2011-12-09 LAB — BASIC METABOLIC PANEL
BUN: 10 mg/dL (ref 6–23)
CO2: 25 mEq/L (ref 19–32)
GFR calc non Af Amer: >90 mL/min (ref >90)
Glucose, Bld: 99 mg/dL (ref 70–99)
Potassium: 4.6 mEq/L (ref 3.5–5.1)

## 2011-12-09 MED ORDER — PREDNISONE 50 MG PO TABS
50.0000 mg | ORAL_TABLET | Freq: Two times a day (BID) | ORAL | Status: DC
  Filled 2011-12-09 (×2): qty 1

## 2011-12-09 MED ORDER — METRONIDAZOLE IN NACL 5-0.79 MG/ML-% IV SOLN
500.0000 mg | Freq: Three times a day (TID) | INTRAVENOUS | Status: DC
  Administered 2011-12-09 – 2011-12-11 (×7): 500 mg via INTRAVENOUS
  Filled 2011-12-09 (×9): qty 100

## 2011-12-09 MED ORDER — METHYLPREDNISOLONE SODIUM SUCC 40 MG IJ SOLR
40.0000 mg | Freq: Two times a day (BID) | INTRAMUSCULAR | Status: DC
  Administered 2011-12-09 – 2011-12-18 (×19): 40 mg via INTRAVENOUS
  Filled 2011-12-09 (×20): qty 1

## 2011-12-09 NOTE — Progress Notes (Signed)
Patient ID: [name], male   DOB: 1981/09/01, 31 y.o.   MRN: [id number] Subjective: ABM pain and diarrhea.  No improvement from yesterday.  Objective: Vital signs in last 24 hours: Temp:  [97.9 F (36.6 C)-100 F (37.8 C)] 100 F (37.8 C) (02/23 0541) Pulse Rate:  [74-105] 105  (02/23 0541) Resp:  [17-21] 20  (02/23 0541) BP: (115-147)/(52-68) 120/52 mmHg (02/23 0541) SpO2:  [92 %-97 %] 97 % (02/23 0541) Weight:  [86.864 kg (191 lb 8 oz)] 86.864 kg (191 lb 8 oz) (02/22 1933) Last BM Date: 12/08/11  Intake/Output from previous day: 02/22 0701 - 02/23 0700 In: 1190 [P.O.:1080; I.V.:110] Out: -  Intake/Output this shift: Total I/O In: 830 [P.O.:720; I.V.:110] Out: -   General appearance: alert and icteric GI: mildly tender on the left side, but much improved compared to earlier this week  Lab Results: No results found for this basename: WBC:3,HGB:3,HCT:3,PLT:3 in the last 72 hours BMET No results found for this basename: NA:3,K:3,CL:3,CO2:3,GLUCOSE:3,BUN:3,CREATININE:3,CALCIUM:3 in the last 72 hours LFT No results found for this basename: PROT,ALBUMIN,AST,ALT,ALKPHOS,BILITOT,BILIDIR,IBILI in the last 72 hours PT/INR No results found for this basename: LABPROT:2,INR:2 in the last 72 hours Hepatitis Panel No results found for this basename: HEPBSAG,HCVAB,HEPAIGM,HEPBIGM in the last 72 hours C-Diff No results found for this basename: CDIFFTOX:3 in the last 72 hours Fecal Lactopherrin No results found for this basename: FECLLACTOFRN in the last 72 hours  Studies/Results: No results found.  Medications:  Scheduled:   . sodium chloride   Intravenous Once  . mesalamine  800 mg Oral TID  . ondansetron (ZOFRAN) IV  4 mg Intravenous Q6H   Or  . ondansetron  4 mg Oral Q6H  . predniSONE  20 mg Oral BID WC  . risperiDONE  1 mg Oral Daily   Continuous:   . 0.9 % NaCl with KCl 40 mEq / L 10 mL/hr (12/08/11 1000)    Assessment/Plan: 1) Severe left sided UC   No  significant change from yesterday.  He reports having 9 diarrhea BMs yesterday.  Still with some hematochezia.  Biopsies are consistent with UC.  Plan: 1) Continue with the current treatment. 2) If this fails, then Remicade will need to be administered.  LOS: 8 days   Zykee Avakian D 12/09/2011, 6:59 AM

## 2011-12-09 NOTE — Progress Notes (Signed)
Subjective: Still with bloody diarrhea   Objective: Vital signs in last 24 hours: Temp:  [97.9 F (36.6 C)-100 F (37.8 C)] 99.6 F (37.6 C) (02/23 0943) Pulse Rate:  [74-112] 112  (02/23 0943) Resp:  [16-21] 16  (02/23 0943) BP: (111-147)/(52-68) 111/58 mmHg (02/23 0943) SpO2:  [92 %-97 %] 97 % (02/23 0943) Weight:  [86.864 kg (191 lb 8 oz)] 86.864 kg (191 lb 8 oz) (02/22 1933) Weight change: 0.68 kg (1 lb 8 oz) Last BM Date: 12/09/11  Intake/Output from previous day: 02/22 0701 - 02/23 0700 In: 1190 [P.O.:1080; I.V.:110] Out: -  Total I/O In: 240 [P.O.:240] Out: -    Pathology  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  Physical Exam: CVS: RRR RS: CTAB Abdomen : soft , NT    Lab Results:  Basename 12/09/11 0600  WBC 24.9*  HGB 14.4  HCT 42.8  PLT 370    Basename 12/09/11 0600  NA 135  K 4.6  CL 99  CO2 25  GLUCOSE 99  BUN 10  CREATININE 0.80  CALCIUM 9.1   Recent Results (from the past 240 hour(s))  CLOSTRIDIUM DIFFICILE BY PCR     Status: Normal   Collection Time   12/02/11  8:38 PM      Component Value Range Status Comment   C difficile by pcr NEGATIVE  NEGATIVE  Final      Studies/Results: No results found.  Medications: Scheduled Meds:    . sodium chloride   Intravenous Once  . mesalamine  800 mg Oral TID  . methylPREDNISolone (SOLU-MEDROL) injection  40 mg Intravenous Q12H  . metronidazole  500 mg Intravenous Q8H  . ondansetron (ZOFRAN) IV  4 mg Intravenous Q6H   Or  . ondansetron  4 mg Oral Q6H  . risperiDONE  1 mg Oral Daily  . DISCONTD: predniSONE  20 mg Oral BID WC  . DISCONTD: predniSONE  50 mg Oral BID WC   Continuous Infusions:    . 0.9 % NaCl with KCl 40 mEq / L 10 mL/hr (12/08/11 1000)   PRN Meds:.acetaminophen, acetaminophen, alum & mag hydroxide-simeth, HYDROmorphone, oxyCODONE, promethazine, zolpidem  Assessment/Plan:  Principal Problem:  *ULCERATIVE COLITIS: GI  consultation was provided by Dr Lucio Edward (covering for Dr Carol Ada), and he initially felt that patient may have an acute gastroenteritis, probably of viral etiology. GI then discontinued steroids, but continued Ciprofloxacin/Flagyl Day# 5. C. Difficile PCR was negative. As stool became bloody on 12/05/11, flare of UC was placed once again in the differential. Dr Benson Norway performed a flexible sigmoidoscopy on 12/05/11, which revealed a severe colitis from the dentate line up to the splenic flexure region. Cold biopsies were obtained. Retroflexed views in the rectum revealed medium hemorrhoids. Antibiotics were discontinued on 12/05/11, and steroids have been recommenced. Asacol continues, as well as supportive measures.  Change steroids to  Iv and resume abx    LOS: 8 days   [name] 12/09/2011, 10:12 AM

## 2011-12-10 LAB — CBC
MCH: 28.6 pg (ref 26.0–34.0)
MCV: 84.3 fL (ref 78.0–100.0)
Platelets: 362 10*3/uL (ref 150–400)
RDW: 15.7 % — ABNORMAL HIGH (ref 11.5–15.5)

## 2011-12-10 LAB — BASIC METABOLIC PANEL
Calcium: 9.2 mg/dL (ref 8.4–10.5)
Creatinine, Ser: 0.67 mg/dL (ref 0.50–1.35)
GFR calc Af Amer: >90 mL/min (ref >90)
GFR calc non Af Amer: >90 mL/min (ref >90)

## 2011-12-10 NOTE — Progress Notes (Signed)
Subjective: Slightly better  Less blood in stool  Objective: Vital signs in last 24 hours: Temp:  [97.8 F (36.6 C)-99.8 F (37.7 C)] 97.8 F (36.6 C) (02/24 1003) Pulse Rate:  [94-105] 95  (02/24 1003) Resp:  [17-18] 17  (02/24 1003) BP: (133-148)/(66-86) 140/86 mmHg (02/24 1003) SpO2:  [95 %-100 %] 100 % (02/24 1003) Weight change:  Last BM Date: 12/09/11  Intake/Output from previous day: 02/23 0701 - 02/24 0700 In: 1558 [P.O.:1558] Out: -  Total I/O In: 360 [P.O.:360] Out: -    Pathology  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  Physical Exam: CVS: RRR RS: CTAB Abdomen : soft , NT    Lab Results:  Basename 12/10/11 0650 12/09/11 0600  WBC 25.3* 24.9*  HGB 13.1 14.4  HCT 38.6* 42.8  PLT 362 370    Basename 12/10/11 0650 12/09/11 0600  NA 136 135  K 4.3 4.6  CL 98 99  CO2 26 25  GLUCOSE 175* 99  BUN 10 10  CREATININE 0.67 0.80  CALCIUM 9.2 9.1   Recent Results (from the past 240 hour(s))  CLOSTRIDIUM DIFFICILE BY PCR     Status: Normal   Collection Time   12/02/11  8:38 PM      Component Value Range Status Comment   C difficile by pcr NEGATIVE  NEGATIVE  Final      Studies/Results: No results found.  Medications: Scheduled Meds:    . sodium chloride   Intravenous Once  . mesalamine  800 mg Oral TID  . methylPREDNISolone (SOLU-MEDROL) injection  40 mg Intravenous Q12H  . metronidazole  500 mg Intravenous Q8H  . ondansetron (ZOFRAN) IV  4 mg Intravenous Q6H   Or  . ondansetron  4 mg Oral Q6H  . risperiDONE  1 mg Oral Daily   Continuous Infusions:    . 0.9 % NaCl with KCl 40 mEq / L 10 mL/hr (12/08/11 1000)   PRN Meds:.acetaminophen, acetaminophen, alum & mag hydroxide-simeth, HYDROmorphone, oxyCODONE, promethazine, zolpidem  Assessment/Plan:  Principal Problem:  *ULCERATIVE COLITIS: GI consultation was provided by Dr Lucio Edward (covering for Dr Carol Ada), and he initially felt that  patient may have an acute gastroenteritis, probably of viral etiology. GI then discontinued steroids, but continued Ciprofloxacin/Flagyl Day# 5. C. Difficile PCR was negative. As stool became bloody on 12/05/11, flare of UC was placed once again in the differential. Dr Benson Norway performed a flexible sigmoidoscopy on 12/05/11, which revealed a severe colitis from the dentate line up to the splenic flexure region. Cold biopsies were obtained. Retroflexed views in the rectum revealed medium hemorrhoids. Antibiotics were discontinued on 12/05/11, and steroids have been recommenced. Asacol continues, as well as supportive measures.  Change steroids to  Iv and resume abx    LOS: 9 days   [name] 12/10/2011, 2:08 PM

## 2011-12-10 NOTE — Progress Notes (Signed)
Patient ID: [name], male   DOB: 1981-08-04, 31 y.o.   MRN: [id number] Subjective: Still with diarrhea, hematochezia, and ABM pain.  Objective: Vital signs in last 24 hours: Temp:  [98.4 F (36.9 C)-99.8 F (37.7 C)] 99 F (37.2 C) (02/24 0446) Pulse Rate:  [94-112] 94  (02/24 0446) Resp:  [16-18] 18  (02/24 0446) BP: (111-148)/(55-76) 148/75 mmHg (02/24 0446) SpO2:  [95 %-98 %] 97 % (02/24 0446) Last BM Date: 12/09/11  Intake/Output from previous day: 02/23 0701 - 02/24 0700 In: 1558 [P.O.:1558] Out: -  Intake/Output this shift:    General appearance: alert and no distress GI: soft, non-tender; bowel sounds normal; no masses,  no organomegaly  Lab Results:  Basename 12/10/11 0650 12/09/11 0600  WBC 25.3* 24.9*  HGB 13.1 14.4  HCT 38.6* 42.8  PLT 362 370   BMET  Basename 12/09/11 0600  NA 135  K 4.6  CL 99  CO2 25  GLUCOSE 99  BUN 10  CREATININE 0.80  CALCIUM 9.1   LFT No results found for this basename: PROT,ALBUMIN,AST,ALT,ALKPHOS,BILITOT,BILIDIR,IBILI in the last 72 hours PT/INR No results found for this basename: LABPROT:2,INR:2 in the last 72 hours Hepatitis Panel No results found for this basename: HEPBSAG,HCVAB,HEPAIGM,HEPBIGM in the last 72 hours C-Diff No results found for this basename: CDIFFTOX:3 in the last 72 hours Fecal Lactopherrin No results found for this basename: FECLLACTOFRN in the last 72 hours  Studies/Results: No results found.  Medications:  Scheduled:   . sodium chloride   Intravenous Once  . mesalamine  800 mg Oral TID  . methylPREDNISolone (SOLU-MEDROL) injection  40 mg Intravenous Q12H  . metronidazole  500 mg Intravenous Q8H  . ondansetron (ZOFRAN) IV  4 mg Intravenous Q6H   Or  . ondansetron  4 mg Oral Q6H  . risperiDONE  1 mg Oral Daily  . DISCONTD: predniSONE  20 mg Oral BID WC  . DISCONTD: predniSONE  50 mg Oral BID WC   Continuous:   . 0.9 % NaCl with KCl 40 mEq / L 10 mL/hr (12/08/11 1000)     Assessment/Plan: 1) Severe left sided UC    Fluctuating clinical course.  Still need to persist with steroids and Asacol for another week or so.  If he fails, then Remicade will be initiated.  Plan:  1) Continue with the current treatment.   LOS: 9 days   Vidur Knust D 12/10/2011, 7:15 AM

## 2011-12-11 ENCOUNTER — Inpatient Hospital Stay (HOSPITAL_COMMUNITY): Payer: Medicare Other

## 2011-12-11 LAB — BASIC METABOLIC PANEL
Chloride: 98 mEq/L (ref 96–112)
Creatinine, Ser: 0.83 mg/dL (ref 0.50–1.35)
GFR calc Af Amer: >90 mL/min (ref >90)
Potassium: 4.1 mEq/L (ref 3.5–5.1)

## 2011-12-11 LAB — CBC
Platelets: 432 10*3/uL — ABNORMAL HIGH (ref 150–400)
RDW: 15.8 % — ABNORMAL HIGH (ref 11.5–15.5)
WBC: 20.3 10*3/uL — ABNORMAL HIGH (ref 4.0–10.5)

## 2011-12-11 NOTE — Progress Notes (Signed)
Subjective: Patient claims he is feeling much better tonight with 4 BM's today. He feels that the volume of stools has decreased and the blood in the stools is also a lot less. He however is very nauseated; dinner at bedside. Hs a spike in his temperature to 102.73F an hour ago. He denies any fever, chills or rigors.   Objective: Vital signs in last 24 hours: Temp:  [98.3 F (36.8 C)-102.3 F (39.1 C)] 102.3 F (39.1 C) (02/25 1746) Pulse Rate:  [72-118] 118  (02/25 1746) Resp:  [18-20] 20  (02/25 1746) BP: (124-130)/(53-77) 124/77 mmHg (02/25 1746) SpO2:  [96 %-99 %] 99 % (02/25 1746) Weight:  [83.961 kg (185 lb 1.6 oz)] 83.961 kg (185 lb 1.6 oz) (02/24 2215) Last BM Date: 12/11/11  Intake/Output from previous day: 02/24 0701 - 02/25 0700 In: 2020 [P.O.:1800; I.V.:120; IV Piggyback:100] Out: -  Intake/Output this shift: Total I/O In: 240 [P.O.:240] Out: -   General appearance: alert, cooperative, appears stated age and no distress Resp: clear to auscultation bilaterally Cardio: regular rate and rhythm, S1, S2 normal, no murmur, click, rub or gallop GI: soft, non-tender; bowel sounds normal; no masses,  no organomegaly Extremities: extremities normal, atraumatic, no cyanosis or edema  Lab Results:  Basename 12/11/11 0530 12/10/11 0650 12/09/11 0600  WBC 20.3* 25.3* 24.9*  HGB 13.3 13.1 14.4  HCT 39.5 38.6* 42.8  PLT 432* 362 370   BMET  Basename 12/11/11 0530 12/10/11 0650 12/09/11 0600  NA 137 136 135  K 4.1 4.3 4.6  CL 98 98 99  CO2 30 26 25   GLUCOSE 128* 175* 99  BUN 15 10 10   CREATININE 0.83 0.67 0.80  CALCIUM 9.3 9.2 9.1   LMedications: I have reviewed the patient's current medications.  Assessment/Plan: Severe ulcerative colitis: On Asacol and Solumedrol IV. May need to stop the Metronidazole and do a work up for his fever. Will discuss with Dr. Marye Round.  LOS: 10 days   [name] 12/11/2011, 6:24 PM

## 2011-12-11 NOTE — Progress Notes (Signed)
Subjective: Late entry for date of service 12/11/11 Patient seen yesterday during rounds - was doing better   Objective: Vital signs in last 24 hours: Temp:  [97.7 F (36.5 C)-99.2 F (37.3 C)] 98.4 F (36.9 C) (02/25 0452) Pulse Rate:  [72-95] 72  (02/25 0452) Resp:  [16-18] 18  (02/25 0452) BP: (125-140)/(53-86) 126/53 mmHg (02/25 0452) SpO2:  [96 %-100 %] 96 % (02/25 0452) Weight:  [83.961 kg (185 lb 1.6 oz)-85.14 kg (187 lb 11.2 oz)] 83.961 kg (185 lb 1.6 oz) (02/24 2215) Weight change:  Last BM Date: 12/11/11  Intake/Output from previous day: 02/24 0701 - 02/25 0700 In: 2020 [P.O.:1800; I.V.:120; IV Piggyback:100] Out: -    Alert and oriented x3 cvs : rrr Rs: ctab   Pathology  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      Lab Results:  Basename 12/11/11 0530 12/10/11 0650  WBC 20.3* 25.3*  HGB 13.3 13.1  HCT 39.5 38.6*  PLT 432* 362    Basename 12/11/11 0530 12/10/11 0650  NA 137 136  K 4.1 4.3  CL 98 98  CO2 30 26  GLUCOSE 128* 175*  BUN 15 10  CREATININE 0.83 0.67  CALCIUM 9.3 9.2   Recent Results (from the past 240 hour(s))  CLOSTRIDIUM DIFFICILE BY PCR     Status: Normal   Collection Time   12/02/11  8:38 PM      Component Value Range Status Comment   C difficile by pcr NEGATIVE  NEGATIVE  Final      Studies/Results: No results found.  Medications: Scheduled Meds:    . sodium chloride   Intravenous Once  . mesalamine  800 mg Oral TID  . methylPREDNISolone (SOLU-MEDROL) injection  40 mg Intravenous Q12H  . metronidazole  500 mg Intravenous Q8H  . ondansetron (ZOFRAN) IV  4 mg Intravenous Q6H   Or  . ondansetron  4 mg Oral Q6H  . risperiDONE  1 mg Oral Daily   Continuous Infusions:    . 0.9 % NaCl with KCl 40 mEq / L 10 mL/hr (12/08/11 1000)   PRN Meds:.acetaminophen, acetaminophen, alum & mag hydroxide-simeth, HYDROmorphone, oxyCODONE, promethazine, zolpidem  Assessment/Plan:  Principal  Problem:  *ULCERATIVE COLITIS: GI consultation was provided by Dr Lucio Edward (covering for Dr Carol Ada), and he initially felt that patient may have an acute gastroenteritis, probably of viral etiology. GI then discontinued steroids, but continued Ciprofloxacin/Flagyl Day# 5. C. Difficile PCR was negative. As stool became bloody on 12/05/11, flare of UC was placed once again in the differential. Dr Benson Norway performed a flexible sigmoidoscopy on 12/05/11, which revealed a severe colitis from the dentate line up to the splenic flexure region. Cold biopsies were obtained. Retroflexed views in the rectum revealed medium hemorrhoids. Antibiotics were discontinued on 12/05/11, and steroids have been recommenced. Asacol continues, as well as supportive measures.  Changed steroids to  Iv and resumed abx on 12/08/10 Plan is to continue current steroids   LOS: 10 days   Tahjanae Blankenburg 12/11/2011, 9:47 AM

## 2011-12-11 NOTE — Progress Notes (Signed)
Utilization Review Completed.[name] T2/25/2013

## 2011-12-12 LAB — BASIC METABOLIC PANEL
BUN: 19 mg/dL (ref 6–23)
Chloride: 95 mEq/L — ABNORMAL LOW (ref 96–112)
GFR calc Af Amer: >90 mL/min (ref >90)
Potassium: 3.6 mEq/L (ref 3.5–5.1)

## 2011-12-12 LAB — CBC
HCT: 40.9 % (ref 39.0–52.0)
Hemoglobin: 13.8 g/dL (ref 13.0–17.0)
RDW: 15.5 % (ref 11.5–15.5)
WBC: 22.1 10*3/uL — ABNORMAL HIGH (ref 4.0–10.5)

## 2011-12-12 LAB — CLOSTRIDIUM DIFFICILE BY PCR: Toxigenic C. Difficile by PCR: NEGATIVE

## 2011-12-12 NOTE — Progress Notes (Signed)
Patient ID: [name], male   DOB: 01/06/1981, 31 y.o.   MRN: [id number] Subjective: Fair day yesterday.  7 bowel movements.  No fever overnight.  Objective: Vital signs in last 24 hours: Temp:  [98.3 F (36.8 C)-102.3 F (39.1 C)] 98.4 F (36.9 C) (02/26 0538) Pulse Rate:  [78-118] 104  (02/26 0538) Resp:  [18-20] 20  (02/26 0538) BP: (111-132)/(60-77) 132/77 mmHg (02/26 0538) SpO2:  [96 %-99 %] 96 % (02/26 0538) Last BM Date: 12/11/11  Intake/Output from previous day: 02/25 0701 - 02/26 0700 In: 240 [P.O.:240] Out: -  Intake/Output this shift:    General appearance: alert and no distress GI: soft, non-tender; bowel sounds normal; no masses,  no organomegaly  Lab Results:  Basename 12/12/11 0525 12/11/11 0530 12/10/11 0650  WBC 22.1* 20.3* 25.3*  HGB 13.8 13.3 13.1  HCT 40.9 39.5 38.6*  PLT 457* 432* 362   BMET  Basename 12/12/11 0525 12/11/11 0530 12/10/11 0650  NA 136 137 136  K 3.6 4.1 4.3  CL 95* 98 98  CO2 29 30 26   GLUCOSE 183* 128* 175*  BUN 19 15 10   CREATININE 0.91 0.83 0.67  CALCIUM 9.3 9.3 9.2   LFT No results found for this basename: PROT,ALBUMIN,AST,ALT,ALKPHOS,BILITOT,BILIDIR,IBILI in the last 72 hours PT/INR No results found for this basename: LABPROT:2,INR:2 in the last 72 hours Hepatitis Panel No results found for this basename: HEPBSAG,HCVAB,HEPAIGM,HEPBIGM in the last 72 hours C-Diff No results found for this basename: CDIFFTOX:3 in the last 72 hours Fecal Lactopherrin No results found for this basename: FECLLACTOFRN in the last 72 hours  Studies/Results: Dg Chest Port 1 View  12/11/2011  *RADIOLOGY REPORT*  Clinical Data: Fever.  PORTABLE CHEST - 1 VIEW  Comparison: 12/17/2009  Findings: The heart size is normal.  Lungs are free of focal consolidations and pleural effusions.  No evidence for pulmonary edema. Visualized osseous structures have a normal appearance.  IMPRESSION: No evidence for acute cardiopulmonary abnormality.   Original Report Authenticated By: Glenice Bow, M.D.    Medications:  Scheduled:   . sodium chloride   Intravenous Once  . mesalamine  800 mg Oral TID  . methylPREDNISolone (SOLU-MEDROL) injection  40 mg Intravenous Q12H  . ondansetron (ZOFRAN) IV  4 mg Intravenous Q6H   Or  . ondansetron  4 mg Oral Q6H  . risperiDONE  1 mg Oral Daily  . DISCONTD: metronidazole  500 mg Intravenous Q8H   Continuous:   . 0.9 % NaCl with KCl 40 mEq / L 10 mL/hr (12/11/11 1335)    Assessment/Plan: 1) Severe UC 2) Fever   I was able to see the residual bowel movement in the toilet and there is still a significant amount of blood.  It seems that his course varies from day to day, i.e., good days and bad days.  No further fever over night.  His WBC is elevated and it can be partly explained with the steroids, however, it is higher than anticipated.  Other sources of fever need to be evaluated per Hospitalist.  Plan: 1) Continue with solumdrol and Asacol.  If no significant improvement through the weekend Remicade will need to be initiated.  LOS: 11 days   Elverta Dimiceli D 12/12/2011, 7:00 AM

## 2011-12-12 NOTE — Progress Notes (Signed)
Subjective:  Same bloody diarrhea    Objective: Vital signs in last 24 hours: Temp:  [98.3 F (36.8 C)-102.3 F (39.1 C)] 98.4 F (36.9 C) (02/26 0538) Pulse Rate:  [78-118] 104  (02/26 0538) Resp:  [18-20] 20  (02/26 0538) BP: (111-132)/(60-77) 132/77 mmHg (02/26 0538) SpO2:  [96 %-99 %] 96 % (02/26 0538) Weight change:  Last BM Date: 12/11/11  Patient Vitals for the past 24 hrs:  BP Temp Temp src Pulse Resp SpO2  12/12/11 0538 132/77 mmHg 98.4 F (36.9 C) Oral 104  20  96 %  12/12/11 0109 111/72 mmHg 98.3 F (36.8 C) Oral 100  20  97 %  12/11/11 1746 124/77 mmHg 102.3 F (39.1 C) Oral 118  20  99 %  12/11/11 1342 128/60 mmHg 98.4 F (36.9 C) Oral 78  18  97 %  12/11/11 1000 130/66 mmHg 98.3 F (36.8 C) Oral 101  18  96 %    Intake/Output from previous day: 02/25 0701 - 02/26 0700 In: 240 [P.O.:240] Out: -    Alert and oriented x3 cvs : rrr Rs: ctab   Pathology  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    Blood cultures and urine culture from 12/11/11 pending   Lab Results:  Basename 12/12/11 0525 12/11/11 0530  WBC 22.1* 20.3*  HGB 13.8 13.3  HCT 40.9 39.5  PLT 457* 432*    Basename 12/12/11 0525 12/11/11 0530  NA 136 137  K 3.6 4.1  CL 95* 98  CO2 29 30  GLUCOSE 183* 128*  BUN 19 15  CREATININE 0.91 0.83  CALCIUM 9.3 9.3   Recent Results (from the past 240 hour(s))  CLOSTRIDIUM DIFFICILE BY PCR     Status: Normal   Collection Time   12/02/11  8:38 PM      Component Value Range Status Comment   C difficile by pcr NEGATIVE  NEGATIVE  Final      Studies/Results: Dg Chest Port 1 View  12/11/2011  *RADIOLOGY REPORT*  Clinical Data: Fever.  PORTABLE CHEST - 1 VIEW  Comparison: 12/17/2009  Findings: The heart size is normal.  Lungs are free of focal consolidations and pleural effusions.  No evidence for pulmonary edema. Visualized osseous structures have a normal appearance.  IMPRESSION: No evidence for  acute cardiopulmonary abnormality.  Original Report Authenticated By: Glenice Bow, M.D.    Medications: Scheduled Meds:    . sodium chloride   Intravenous Once  . mesalamine  800 mg Oral TID  . methylPREDNISolone (SOLU-MEDROL) injection  40 mg Intravenous Q12H  . ondansetron (ZOFRAN) IV  4 mg Intravenous Q6H   Or  . ondansetron  4 mg Oral Q6H  . risperiDONE  1 mg Oral Daily  . DISCONTD: metronidazole  500 mg Intravenous Q8H   Continuous Infusions:    . 0.9 % NaCl with KCl 40 mEq / L 10 mL/hr (12/11/11 1335)   PRN Meds:.acetaminophen, acetaminophen, alum & mag hydroxide-simeth, HYDROmorphone, oxyCODONE, promethazine, zolpidem  Assessment/Plan:  Principal Problem:  *ULCERATIVE COLITIS: GI consultation was provided by Dr Lucio Edward (covering for Dr Carol Ada), and he initially felt that patient may have an acute gastroenteritis, probably of viral etiology. GI then discontinued steroids, but continued Ciprofloxacin/Flagyl Day# 5. C. Difficile PCR was negative. As stool became bloody on 12/05/11, flare of UC was placed once again in the differential. Dr Benson Norway performed a flexible sigmoidoscopy on 12/05/11, which revealed a severe colitis from the dentate line up  to the splenic flexure region. Cold biopsies were obtained. Pathology confirmed UC.  Antibiotics were discontinued on 12/05/11, and steroids have been recommenced. Asacol continues, as well as supportive measures.  Changed steroids to  Iv on 12/09/11.  Plans per gi  Had 1 pike of fever on 12/11/11 cultures negative sofar - no abx   LOS: 11 days   [name] 12/12/2011, 9:00 AM

## 2011-12-13 LAB — CBC
HCT: 41.2 % (ref 39.0–52.0)
Hemoglobin: 14 g/dL (ref 13.0–17.0)
MCV: 83.9 fL (ref 78.0–100.0)
RBC: 4.91 MIL/uL (ref 4.22–5.81)
WBC: 23.1 10*3/uL — ABNORMAL HIGH (ref 4.0–10.5)

## 2011-12-13 LAB — BASIC METABOLIC PANEL
BUN: 17 mg/dL (ref 6–23)
CO2: 30 mEq/L (ref 19–32)
Chloride: 96 mEq/L (ref 96–112)
Creatinine, Ser: 0.87 mg/dL (ref 0.50–1.35)
Potassium: 3.8 mEq/L (ref 3.5–5.1)

## 2011-12-13 LAB — URINE CULTURE
Colony Count: 7000
Culture  Setup Time: 201302260831

## 2011-12-13 NOTE — Progress Notes (Signed)
Patient discussed at the Long Length of Stay Annabell Sabal [facility] 12/13/2011

## 2011-12-13 NOTE — Progress Notes (Signed)
Subjective: persistent bloody diarrhea, slightly improved  Objective: Vital signs in last 24 hours: Temp:  [98.1 F (36.7 C)-99.4 F (37.4 C)] 98.7 F (37.1 C) (02/27 1625) Pulse Rate:  [100-118] 108  (02/27 1625) Resp:  [18-20] 18  (02/27 1625) BP: (118-134)/(64-84) 120/74 mmHg (02/27 1625) SpO2:  [95 %-98 %] 98 % (02/27 1625) Weight:  [80.922 kg (178 lb 6.4 oz)] 80.922 kg (178 lb 6.4 oz) (02/26 2007) Weight change:  Last BM Date: 12/13/11  Patient Vitals for the past 24 hrs:  BP Temp Temp src Pulse Resp SpO2 Weight  12/13/11 1625 120/74 mmHg 98.7 F (37.1 C) Oral 108  18  98 % -  12/13/11 1330 134/84 mmHg 98.1 F (36.7 C) Oral 118  20  97 % -  12/13/11 1000 130/84 mmHg 98.8 F (37.1 C) Oral 113  20  97 % -  12/13/11 0600 118/64 mmHg 98.2 F (36.8 C) - 100  - 95 % -  12/13/11 0036 - 98.3 F (36.8 C) Oral - - - -  12/12/11 2007 - - - - - - 80.922 kg (178 lb 6.4 oz)  12/12/11 1828 131/83 mmHg 99.4 F (37.4 C) Oral 111  18  97 % -    Intake/Output from previous day: 02/26 0701 - 02/27 0700 In: 5929 [P.O.:1200; I.V.:35] Out: -  Total I/O In: 240 [P.O.:240] Out: -  Alert and oriented x3 HEENT: S1S2/RRR Lungs: clear Abd: Soft, mild lower quadrant tenderness, bowel sounds present Ext: no edema   Pathology  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    Blood cultures and urine culture from 12/11/11 pending   Lab Results:  [facility] 12/13/11 0545 12/12/11 0525  WBC 23.1* 22.1*  HGB 14.0 13.8  HCT 41.2 40.9  PLT 484* 457*    Basename 12/13/11 0545 12/12/11 0525  NA 139 136  K 3.8 3.6  CL 96 95*  CO2 30 29  GLUCOSE 174* 183*  BUN 17 19  CREATININE 0.87 0.91  CALCIUM 9.1 9.3   Recent Results (from the past 240 hour(s))  CULTURE, BLOOD (ROUTINE X 2)     Status: Normal (Preliminary result)   Collection Time   12/11/11  8:51 PM      Component Value Range Status Comment   Specimen Description BLOOD LEFT HAND   Final    Special Requests BOTTLES DRAWN AEROBIC ONLY 4CC   Final    Culture  Setup Time [id number]   Final    Culture     Final    Value:        BLOOD CULTURE RECEIVED NO GROWTH TO DATE CULTURE WILL BE HELD FOR 5 DAYS BEFORE ISSUING A FINAL NEGATIVE REPORT   Report Status PENDING   Incomplete   CULTURE, BLOOD (ROUTINE X 2)     Status: Normal (Preliminary result)   Collection Time   12/11/11  9:03 PM      Component Value Range Status Comment   Specimen Description BLOOD RIGHT HAND   Final    Special Requests BOTTLES DRAWN AEROBIC ONLY 5CC   Final    Culture  Setup Time [id number]   Final    Culture     Final    Value:        BLOOD CULTURE RECEIVED NO GROWTH TO DATE CULTURE WILL BE HELD FOR 5 DAYS BEFORE ISSUING A FINAL NEGATIVE REPORT   Report Status PENDING   Incomplete   CLOSTRIDIUM DIFFICILE BY PCR  Status: Normal   Collection Time   12/12/11  5:58 AM      Component Value Range Status Comment   C difficile by pcr NEGATIVE  NEGATIVE  Final   EHEC TOXIN BY EIA, STOOL     Status: Normal   Collection Time   12/12/11  5:58 AM      Component Value Range Status Comment   Specimen Description STOOL   Final    Special Requests ADDED AT 1735 PER RENEE BROWN RN ON 6700 AT 0645   Final    EHEC Toxin by EIA Negative for Shiga toxins I and II   Final    Report Status 12/13/2011 FINAL   Final   URINE CULTURE     Status: Normal   Collection Time   12/12/11  5:59 AM      Component Value Range Status Comment   Specimen Description URINE, RANDOM   Final    Special Requests NONE   Final    Culture  Setup Time [id number]   Final    Colony Count 7,000 COLONIES/ML   Final    Culture INSIGNIFICANT GROWTH   Final    Report Status 12/13/2011 FINAL   Final      Studies/Results: Dg Chest Port 1 View  12/11/2011  *RADIOLOGY REPORT*  Clinical Data: Fever.  PORTABLE CHEST - 1 VIEW  Comparison: 12/17/2009  Findings: The heart size is normal.  Lungs are free of focal consolidations and pleural effusions.  No  evidence for pulmonary edema. Visualized osseous structures have a normal appearance.  IMPRESSION: No evidence for acute cardiopulmonary abnormality.  Original Report Authenticated By: Glenice Bow, M.D.    Medications: Scheduled Meds:    . sodium chloride   Intravenous Once  . mesalamine  800 mg Oral TID  . methylPREDNISolone (SOLU-MEDROL) injection  40 mg Intravenous Q12H  . ondansetron (ZOFRAN) IV  4 mg Intravenous Q6H   Or  . ondansetron  4 mg Oral Q6H  . risperiDONE  1 mg Oral Daily   Continuous Infusions:    . 0.9 % NaCl with KCl 40 mEq / L 10 mL/hr (12/12/11 1321)   PRN Meds:.acetaminophen, acetaminophen, alum & mag hydroxide-simeth, HYDROmorphone, oxyCODONE, promethazine, zolpidem  Assessment/Plan:  1.ULCERATIVE COLITIS flare Followed by Dr.Hung C. Difficile PCR was negative. s/p flexible sigmoidoscopy on 12/05/11, which revealed a severe colitis from the dentate line up to the splenic flexure region. Cold biopsies were obtained. Pathology confirmed UC. Continue Solu-Medrol and Asacol, as well as supportive measures.  2. Leukocytosis: Related to inflammation from UC flare and de-margination from steroids Afebrile for 48 hours, C. difficile PCR negative, CT abdomen pelvis did not reveal any abscess or abnormality besides colitis 3. hyperglycemia secondary to steroids, check HbA1c   LOS: 12 days   [name] 12/13/2011, 5:45 PM

## 2011-12-13 NOTE — Progress Notes (Signed)
Patient ID: [name], male   DOB: 29-Apr-1981, 31 y.o.   MRN: [id number] Subjective: No significant change.  Still with bloody diarrhea.  7 bowel movements yesterday.  Objective: Vital signs in last 24 hours: Temp:  [98.2 F (36.8 C)-99.4 F (37.4 C)] 98.2 F (36.8 C) (02/27 0600) Pulse Rate:  [100-119] 100  (02/27 0600) Resp:  [18] 18  (02/26 1828) BP: (118-131)/(64-83) 118/64 mmHg (02/27 0600) SpO2:  [95 %-97 %] 95 % (02/27 0600) Weight:  [80.922 kg (178 lb 6.4 oz)] 80.922 kg (178 lb 6.4 oz) (02/26 2007) Last BM Date: 12/12/11  Intake/Output from previous day: 02/26 0701 - 02/27 0700 In: 7078 [P.O.:1200; I.V.:35] Out: -  Intake/Output this shift:    General appearance: alert and no distress GI: soft, non-tender; bowel sounds normal; no masses,  no organomegaly  Lab Results:  Basename 12/13/11 0545 12/12/11 0525 12/11/11 0530  WBC 23.1* 22.1* 20.3*  HGB 14.0 13.8 13.3  HCT 41.2 40.9 39.5  PLT 484* 457* 432*   BMET  Basename 12/13/11 0545 12/12/11 0525 12/11/11 0530  NA 139 136 137  K 3.8 3.6 4.1  CL 96 95* 98  CO2 30 29 30   GLUCOSE 174* 183* 128*  BUN 17 19 15   CREATININE 0.87 0.91 0.83  CALCIUM 9.1 9.3 9.3   LFT No results found for this basename: PROT,ALBUMIN,AST,ALT,ALKPHOS,BILITOT,BILIDIR,IBILI in the last 72 hours PT/INR No results found for this basename: LABPROT:2,INR:2 in the last 72 hours Hepatitis Panel No results found for this basename: HEPBSAG,HCVAB,HEPAIGM,HEPBIGM in the last 72 hours C-Diff No results found for this basename: CDIFFTOX:3 in the last 72 hours Fecal Lactopherrin No results found for this basename: FECLLACTOFRN in the last 72 hours  Studies/Results: Dg Chest Port 1 View  12/11/2011  *RADIOLOGY REPORT*  Clinical Data: Fever.  PORTABLE CHEST - 1 VIEW  Comparison: 12/17/2009  Findings: The heart size is normal.  Lungs are free of focal consolidations and pleural effusions.  No evidence for pulmonary edema. Visualized osseous  structures have a normal appearance.  IMPRESSION: No evidence for acute cardiopulmonary abnormality.  Original Report Authenticated By: Glenice Bow, M.D.    Medications:  Scheduled:   . sodium chloride   Intravenous Once  . mesalamine  800 mg Oral TID  . methylPREDNISolone (SOLU-MEDROL) injection  40 mg Intravenous Q12H  . ondansetron (ZOFRAN) IV  4 mg Intravenous Q6H   Or  . ondansetron  4 mg Oral Q6H  . risperiDONE  1 mg Oral Daily   Continuous:   . 0.9 % NaCl with KCl 40 mEq / L 10 mL/hr (12/12/11 1321)    Assessment/Plan: 1) Severe left-sided U.C. 2) Elevated WBC.   Current work up for the prior fever is negative, i.e., C. Diff negative, UA negative, and CXR is normal.  His WBC is still markedly elevated.  I am uncertain about the source of his fever.  No worsening of his abdominal pain.  ? Benefit with abdominal imaging if he spikes a fever again.  If he remains afebrile I think the next move is to start on Remicade, but I am reluctant to start it at this time with the recent fever and the elevated WBC.    Plan: 1) Continue with solumedrol. 2) Continue with Asacol. 3) Follow temp and WBC.  LOS: 12 days   Heidemarie Goodnow D 12/13/2011, 7:15 AM

## 2011-12-14 LAB — BASIC METABOLIC PANEL
BUN: 16 mg/dL (ref 6–23)
Chloride: 93 mEq/L — ABNORMAL LOW (ref 96–112)
Creatinine, Ser: 0.94 mg/dL (ref 0.50–1.35)
GFR calc Af Amer: >90 mL/min (ref >90)
GFR calc non Af Amer: >90 mL/min (ref >90)
Glucose, Bld: 185 mg/dL — ABNORMAL HIGH (ref 70–99)

## 2011-12-14 LAB — HEPATITIS B SURFACE ANTIBODY,QUALITATIVE: Hep B S Ab: NEGATIVE

## 2011-12-14 LAB — URINALYSIS, ROUTINE W REFLEX MICROSCOPIC
Hgb urine dipstick: NEGATIVE
Leukocytes, UA: NEGATIVE
Nitrite: NEGATIVE
Protein, ur: 30 mg/dL — AB
Specific Gravity, Urine: 1.028 (ref 1.005–1.030)
Urobilinogen, UA: 0.2 mg/dL (ref 0.0–1.0)

## 2011-12-14 LAB — CBC
HCT: 39.5 % (ref 39.0–52.0)
Hemoglobin: 13.4 g/dL (ref 13.0–17.0)
MCHC: 33.9 g/dL (ref 30.0–36.0)
MCV: 83.2 fL (ref 78.0–100.0)
RDW: 14.8 % (ref 11.5–15.5)

## 2011-12-14 LAB — URINE MICROSCOPIC-ADD ON

## 2011-12-14 LAB — HEPATITIS B SURFACE ANTIGEN: Hepatitis B Surface Ag: NEGATIVE

## 2011-12-14 MED ORDER — TUBERCULIN PPD 5 UNIT/0.1ML ID SOLN
5.0000 [IU] | Freq: Once | INTRADERMAL | Status: AC
  Administered 2011-12-14: 5 [IU] via INTRADERMAL
  Filled 2011-12-14 (×2): qty 0.1

## 2011-12-14 NOTE — Progress Notes (Signed)
Patient ID: [name], male   DOB: [birth year], 31 y.o.   MRN: [id number] Subjective: Vomiting, 8 BM yesterday  Objective: Vital signs in last 24 hours: Temp:  [98.1 F (36.7 C)-100.8 F (38.2 C)] 98.4 F (36.9 C) (02/28 0500) Pulse Rate:  [103-118] 103  (02/28 0500) Resp:  [18-20] 18  (02/28 0500) BP: (120-142)/(74-86) 142/83 mmHg (02/28 0500) SpO2:  [97 %-100 %] 100 % (02/28 0500) Weight:  [75.7 kg (166 lb 14.2 oz)] 75.7 kg (166 lb 14.2 oz) (02/27 2032) Last BM Date: 12/13/11  Intake/Output from previous day: 02/27 0701 - 02/28 0700 In: 240 [P.O.:240] Out: -  Intake/Output this shift:    General appearance: alert and no distress GI: tender in the left lower quadrant.  Lab Results:  Basename 12/13/11 0545 12/12/11 0525  WBC 23.1* 22.1*  HGB 14.0 13.8  HCT 41.2 40.9  PLT 484* 457*   BMET  Basename 12/13/11 0545 12/12/11 0525  NA 139 136  K 3.8 3.6  CL 96 95*  CO2 30 29  GLUCOSE 174* 183*  BUN 17 19  CREATININE 0.87 0.91  CALCIUM 9.1 9.3   LFT No results found for this basename: PROT,ALBUMIN,AST,ALT,ALKPHOS,BILITOT,BILIDIR,IBILI in the last 72 hours PT/INR No results found for this basename: LABPROT:2,INR:2 in the last 72 hours Hepatitis Panel No results found for this basename: HEPBSAG,HCVAB,HEPAIGM,HEPBIGM in the last 72 hours C-Diff No results found for this basename: CDIFFTOX:3 in the last 72 hours Fecal Lactopherrin No results found for this basename: FECLLACTOFRN in the last 72 hours  Studies/Results: No results found.  Medications:  Scheduled:   . sodium chloride   Intravenous Once  . mesalamine  800 mg Oral TID  . methylPREDNISolone (SOLU-MEDROL) injection  40 mg Intravenous Q12H  . ondansetron (ZOFRAN) IV  4 mg Intravenous Q6H   Or  . ondansetron  4 mg Oral Q6H  . risperiDONE  1 mg Oral Daily  . tuberculin  5 Units Intradermal Once   Continuous:   . 0.9 % NaCl with KCl 40 mEq / L 10 mL/hr (12/12/11 1321)    Assessment/Plan: 1)  Severe left-sided U.C.   Tmax at 100.8.  No clear source of the fever aside from the possibility of his UC, i.e., C. Diff negative and CXR negative.  No UA.  I am unable to get a gauge of his progress today as he reports that he hopes he is getting better.  I think the volume may be decreasing.  Plan: 1) Continue with Solumedrol. 2) Continue with Asacol. 3) Check UA. 4) Check HBV status and TB status.  This is if he requires Remicade.  LOS: 13 days   [name] D 12/14/2011, 7:19 AM

## 2011-12-15 LAB — CBC
Hemoglobin: 12.9 g/dL — ABNORMAL LOW (ref 13.0–17.0)
MCH: 28.2 pg (ref 26.0–34.0)
MCHC: 34 g/dL (ref 30.0–36.0)
Platelets: 494 10*3/uL — ABNORMAL HIGH (ref 150–400)
RDW: 14.8 % (ref 11.5–15.5)

## 2011-12-15 LAB — BASIC METABOLIC PANEL
Calcium: 9.3 mg/dL (ref 8.4–10.5)
GFR calc Af Amer: >90 mL/min (ref >90)
GFR calc non Af Amer: >90 mL/min (ref >90)
Glucose, Bld: 150 mg/dL — ABNORMAL HIGH (ref 70–99)
Potassium: 4 mEq/L (ref 3.5–5.1)
Sodium: 136 mEq/L (ref 135–145)

## 2011-12-15 NOTE — Progress Notes (Signed)
Patient ID: [name], male   DOB: June 26, 1981, 31 y.o.   MRN: [id number] Subjective: Feeling better.  5 BM yesterday.  Less bloody.  Objective: Vital signs in last 24 hours: Temp:  [98.1 F (36.7 C)-99.4 F (37.4 C)] 98.1 F (36.7 C) (03/01 0500) Pulse Rate:  [92-127] 92  (03/01 0500) Resp:  [17-18] 18  (03/01 0500) BP: (113-141)/(64-97) 139/79 mmHg (03/01 0500) SpO2:  [93 %-100 %] 99 % (03/01 0500) Last BM Date: 12/14/11  Intake/Output from previous day: 02/28 0701 - 03/01 0700 In: 360 [P.O.:360] Out: -  Intake/Output this shift:    General appearance: alert and no distress GI: soft, non-tender; bowel sounds normal; no masses,  no organomegaly  Lab Results:  Basename 12/15/11 0700 12/14/11 0603 12/13/11 0545  WBC 17.5* 17.7* 23.1*  HGB 12.9* 13.4 14.0  HCT 37.9* 39.5 41.2  PLT 494* 508* 484*   BMET  Basename 12/14/11 0603 12/13/11 0545  NA 136 139  K 3.8 3.8  CL 93* 96  CO2 30 30  GLUCOSE 185* 174*  BUN 16 17  CREATININE 0.94 0.87  CALCIUM 9.3 9.1   LFT No results found for this basename: PROT,ALBUMIN,AST,ALT,ALKPHOS,BILITOT,BILIDIR,IBILI in the last 72 hours PT/INR No results found for this basename: LABPROT:2,INR:2 in the last 72 hours Hepatitis Panel  Basename 12/14/11 1106  HEPBSAG NEGATIVE  HCVAB NEGATIVE  HEPAIGM --  HEPBIGM --   C-Diff No results found for this basename: CDIFFTOX:3 in the last 72 hours Fecal Lactopherrin No results found for this basename: FECLLACTOFRN in the last 72 hours  Studies/Results: No results found.  Medications:  Scheduled:   . sodium chloride   Intravenous Once  . mesalamine  800 mg Oral TID  . methylPREDNISolone (SOLU-MEDROL) injection  40 mg Intravenous Q12H  . ondansetron (ZOFRAN) IV  4 mg Intravenous Q6H   Or  . ondansetron  4 mg Oral Q6H  . risperiDONE  1 mg Oral Daily  . tuberculin  5 Units Intradermal Once   Continuous:   . 0.9 % NaCl with KCl 40 mEq / L 20 mL/hr (12/15/11 0100)     Assessment/Plan: 1) Severe left-sided UC.   I am encouraged that he is feeling better.  His WBC has decreased and there was no fever overnight.  Clinically his bowel movements and bleeding are decreasing.  PPD was placed yesterday and his HBsAg is negative.  Plan: 1) Continue with steroid and Asacol. 2) Hold on Remicade at this time.  LOS: 14 days   Howard Bunte D 12/15/2011, 8:11 AM

## 2011-12-15 NOTE — Progress Notes (Signed)
Subjective: persistent bloody diarrhea slightly worse last night  Objective: Vital signs in last 24 hours: Temp:  [98.1 F (36.7 C)-99.4 F (37.4 C)] 98.3 F (36.8 C) (03/01 1303) Pulse Rate:  [92-105] 100  (03/01 1303) Resp:  [17-18] 17  (03/01 1303) BP: (113-143)/(61-94) 115/61 mmHg (03/01 1303) SpO2:  [95 %-100 %] 100 % (03/01 1303) Weight change:  Last BM Date: 12/15/11  Patient Vitals for the past 24 hrs:  BP Temp Temp src Pulse Resp SpO2  12/15/11 1303 115/61 mmHg 98.3 F (36.8 C) Oral 100  17  100 %  12/15/11 0947 143/94 mmHg 98.5 F (36.9 C) Oral 101  17  95 %  12/15/11 0500 139/79 mmHg 98.1 F (36.7 C) Oral 92  18  99 %  12/14/11 2130 113/64 mmHg 98.2 F (36.8 C) Oral 102  18  100 %  12/14/11 1837 141/83 mmHg 99.4 F (37.4 C) Oral 105  18  100 %    Intake/Output from previous day: 02/28 0701 - 03/01 0700 In: 360 [P.O.:360] Out: -  Total I/O In: 440 [P.O.:360; I.V.:80] Out: -  Alert and oriented x3 HEENT: S1S2/RRR Lungs: clear Abd: Soft, mild lower quadrant tenderness, bowel sounds present Ext: no edema   Pathology  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    Blood cultures and urine culture from 12/11/11 pending   Lab Results:  Basename 12/15/11 0700 12/14/11 0603  WBC 17.5* 17.7*  HGB 12.9* 13.4  HCT 37.9* 39.5  PLT 494* 508*    Basename 12/15/11 0700 12/14/11 0603  NA 136 136  K 4.0 3.8  CL 93* 93*  CO2 31 30  GLUCOSE 150* 185*  BUN 18 16  CREATININE 0.95 0.94  CALCIUM 9.3 9.3   Recent Results (from the past 240 hour(s))  CULTURE, BLOOD (ROUTINE X 2)     Status: Normal (Preliminary result)   Collection Time   12/11/11  8:51 PM      Component Value Range Status Comment   Specimen Description BLOOD LEFT HAND   Final    Special Requests BOTTLES DRAWN AEROBIC ONLY 4CC   Final    Culture  Setup Time [id number]   Final    Culture     Final    Value:        BLOOD CULTURE RECEIVED NO GROWTH TO  DATE CULTURE WILL BE HELD FOR 5 DAYS BEFORE ISSUING A FINAL NEGATIVE REPORT   Report Status PENDING   Incomplete   CULTURE, BLOOD (ROUTINE X 2)     Status: Normal (Preliminary result)   Collection Time   12/11/11  9:03 PM      Component Value Range Status Comment   Specimen Description BLOOD RIGHT HAND   Final    Special Requests BOTTLES DRAWN AEROBIC ONLY 5CC   Final    Culture  Setup Time [id number]   Final    Culture     Final    Value:        BLOOD CULTURE RECEIVED NO GROWTH TO DATE CULTURE WILL BE HELD FOR 5 DAYS BEFORE ISSUING A FINAL NEGATIVE REPORT   Report Status PENDING   Incomplete   CLOSTRIDIUM DIFFICILE BY PCR     Status: Normal   Collection Time   12/12/11  5:58 AM      Component Value Range Status Comment   C difficile by pcr NEGATIVE  NEGATIVE  Final   EHEC TOXIN BY EIA, STOOL     Status: Normal  Collection Time   12/12/11  5:58 AM      Component Value Range Status Comment   Specimen Description STOOL   Final    Special Requests ADDED AT 7412 PER RENEE BROWN RN ON 6700 AT 8786   Final    EHEC Toxin by EIA Negative for Shiga toxins I and II   Final    Report Status 12/13/2011 FINAL   Final   URINE CULTURE     Status: Normal   Collection Time   12/12/11  5:59 AM      Component Value Range Status Comment   Specimen Description URINE, RANDOM   Final    Special Requests NONE   Final    Culture  Setup Time [id number]   Final    Colony Count 7,000 COLONIES/ML   Final    Culture INSIGNIFICANT GROWTH   Final    Report Status 12/13/2011 FINAL   Final      Studies/Results: No results found.  Medications: Scheduled Meds:    . mesalamine  800 mg Oral TID  . methylPREDNISolone (SOLU-MEDROL) injection  40 mg Intravenous Q12H  . ondansetron (ZOFRAN) IV  4 mg Intravenous Q6H   Or  . ondansetron  4 mg Oral Q6H  . risperiDONE  1 mg Oral Daily  . DISCONTD: sodium chloride   Intravenous Once   Continuous Infusions:    . 0.9 % NaCl with KCl 40 mEq / L 10 mL/hr  (12/15/11 1201)   PRN Meds:.acetaminophen, acetaminophen, alum & mag hydroxide-simeth, HYDROmorphone, oxyCODONE, promethazine, zolpidem  Assessment/Plan:  1.ULCERATIVE COLITIS flare Followed by Dr.Hung C. Difficile PCR was negative. s/p flexible sigmoidoscopy on 12/05/11, which revealed a severe colitis from the dentate line up to the splenic flexure region. Cold biopsies were obtained. Pathology confirmed UC. Continue Solu-Medrol and Asacol, as well as supportive measures.  2. Leukocytosis: Related to inflammation from UC flare and de-margination from steroids Afebrile for 48 hours, C. difficile PCR negative, CT abdomen pelvis did not reveal any abscess or abnormality besides colitis 3. hyperglycemia secondary to steroids.   LOS: 14 days   [name] 12/15/2011, 5:02 PM

## 2011-12-15 NOTE — Progress Notes (Signed)
Subjective: persistent bloody diarrhea, slightly improved  Objective: Vital signs in last 24 hours: Temp:  [98.1 F (36.7 C)-99.4 F (37.4 C)] 98.3 F (36.8 C) (03/01 1303) Pulse Rate:  [92-105] 100  (03/01 1303) Resp:  [17-18] 17  (03/01 1303) BP: (113-143)/(61-94) 115/61 mmHg (03/01 1303) SpO2:  [95 %-100 %] 100 % (03/01 1303) Weight change:  Last BM Date: 12/15/11  Patient Vitals for the past 24 hrs:  BP Temp Temp src Pulse Resp SpO2  12/15/11 1303 115/61 mmHg 98.3 F (36.8 C) Oral 100  17  100 %  12/15/11 0947 143/94 mmHg 98.5 F (36.9 C) Oral 101  17  95 %  12/15/11 0500 139/79 mmHg 98.1 F (36.7 C) Oral 92  18  99 %  12/14/11 2130 113/64 mmHg 98.2 F (36.8 C) Oral 102  18  100 %  12/14/11 1837 141/83 mmHg 99.4 F (37.4 C) Oral 105  18  100 %    Intake/Output from previous day: 02/28 0701 - 03/01 0700 In: 360 [P.O.:360] Out: -  Total I/O In: 440 [P.O.:360; I.V.:80] Out: -  Alert and oriented x3 HEENT: S1S2/RRR Lungs: clear Abd: Soft, mild lower quadrant tenderness, bowel sounds present Ext: no edema   Pathology  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    Blood cultures and urine culture from 12/11/11 pending   Lab Results:  Basename 12/15/11 0700 12/14/11 0603  WBC 17.5* 17.7*  HGB 12.9* 13.4  HCT 37.9* 39.5  PLT 494* 508*    Basename 12/15/11 0700 12/14/11 0603  NA 136 136  K 4.0 3.8  CL 93* 93*  CO2 31 30  GLUCOSE 150* 185*  BUN 18 16  CREATININE 0.95 0.94  CALCIUM 9.3 9.3   Recent Results (from the past 240 hour(s))  CULTURE, BLOOD (ROUTINE X 2)     Status: Normal (Preliminary result)   Collection Time   12/11/11  8:51 PM      Component Value Range Status Comment   Specimen Description BLOOD LEFT HAND   Final    Special Requests BOTTLES DRAWN AEROBIC ONLY 4CC   Final    Culture  Setup Time [id number]   Final    Culture     Final    Value:        BLOOD CULTURE RECEIVED NO GROWTH TO DATE  CULTURE WILL BE HELD FOR 5 DAYS BEFORE ISSUING A FINAL NEGATIVE REPORT   Report Status PENDING   Incomplete   CULTURE, BLOOD (ROUTINE X 2)     Status: Normal (Preliminary result)   Collection Time   12/11/11  9:03 PM      Component Value Range Status Comment   Specimen Description BLOOD RIGHT HAND   Final    Special Requests BOTTLES DRAWN AEROBIC ONLY 5CC   Final    Culture  Setup Time [id number]   Final    Culture     Final    Value:        BLOOD CULTURE RECEIVED NO GROWTH TO DATE CULTURE WILL BE HELD FOR 5 DAYS BEFORE ISSUING A FINAL NEGATIVE REPORT   Report Status PENDING   Incomplete   CLOSTRIDIUM DIFFICILE BY PCR     Status: Normal   Collection Time   12/12/11  5:58 AM      Component Value Range Status Comment   C difficile by pcr NEGATIVE  NEGATIVE  Final   EHEC TOXIN BY EIA, STOOL     Status: Normal  Collection Time   12/12/11  5:58 AM      Component Value Range Status Comment   Specimen Description STOOL   Final    Special Requests ADDED AT 8466 PER RENEE BROWN RN ON 6700 AT 5993   Final    EHEC Toxin by EIA Negative for Shiga toxins I and II   Final    Report Status 12/13/2011 FINAL   Final   URINE CULTURE     Status: Normal   Collection Time   12/12/11  5:59 AM      Component Value Range Status Comment   Specimen Description URINE, RANDOM   Final    Special Requests NONE   Final    Culture  Setup Time [id number]   Final    Colony Count 7,000 COLONIES/ML   Final    Culture INSIGNIFICANT GROWTH   Final    Report Status 12/13/2011 FINAL   Final      Studies/Results: No results found.  Medications: Scheduled Meds:    . mesalamine  800 mg Oral TID  . methylPREDNISolone (SOLU-MEDROL) injection  40 mg Intravenous Q12H  . ondansetron (ZOFRAN) IV  4 mg Intravenous Q6H   Or  . ondansetron  4 mg Oral Q6H  . risperiDONE  1 mg Oral Daily  . DISCONTD: sodium chloride   Intravenous Once   Continuous Infusions:    . 0.9 % NaCl with KCl 40 mEq / L 10 mL/hr (12/15/11  1201)   PRN Meds:.acetaminophen, acetaminophen, alum & mag hydroxide-simeth, HYDROmorphone, oxyCODONE, promethazine, zolpidem  Assessment/Plan:  1.ULCERATIVE COLITIS flare Followed by Dr.Hung C. Difficile PCR was negative. s/p flexible sigmoidoscopy on 12/05/11, which revealed a severe colitis from the dentate line up to the splenic flexure region. Cold biopsies were obtained. Pathology confirmed UC. Continue Solu-Medrol and Asacol, as well as supportive measures.  2. Leukocytosis: Related to inflammation from UC flare and de-margination from steroids Afebrile for 48 hours, C. difficile PCR negative, CT abdomen pelvis did not reveal any abscess or abnormality besides colitis 3. hyperglycemia secondary to steroids, HbA1c <6   LOS: 14 days   [name] 12/15/2011, 5:01 PM

## 2011-12-15 NOTE — Progress Notes (Signed)
Pt vomited a large amount of yellow tinged vomit, despite having both Zofran and Phenergan this morning at 6am. Pt is also having bloody stools. Per pt, his stools seem to be getting worse. I collected a sample of the bloody stool and it is being stored in the bathroom.

## 2011-12-16 LAB — CBC
Hemoglobin: 12.4 g/dL — ABNORMAL LOW (ref 13.0–17.0)
MCH: 28.1 pg (ref 26.0–34.0)
Platelets: 495 10*3/uL — ABNORMAL HIGH (ref 150–400)
RBC: 4.42 MIL/uL (ref 4.22–5.81)
WBC: 23.7 10*3/uL — ABNORMAL HIGH (ref 4.0–10.5)

## 2011-12-16 LAB — BASIC METABOLIC PANEL
CO2: 32 mEq/L (ref 19–32)
Calcium: 9.2 mg/dL (ref 8.4–10.5)
Glucose, Bld: 125 mg/dL — ABNORMAL HIGH (ref 70–99)
Potassium: 3.6 mEq/L (ref 3.5–5.1)
Sodium: 137 mEq/L (ref 135–145)

## 2011-12-16 MED ORDER — MESALAMINE 400 MG PO TBEC
1600.0000 mg | DELAYED_RELEASE_TABLET | Freq: Three times a day (TID) | ORAL | Status: DC
  Administered 2011-12-16 – 2011-12-18 (×6): 1600 mg via ORAL
  Filled 2011-12-16 (×9): qty 4

## 2011-12-16 MED ORDER — MESALAMINE 400 MG PO TBEC
800.0000 mg | DELAYED_RELEASE_TABLET | Freq: Once | ORAL | Status: AC
  Administered 2011-12-16: 800 mg via ORAL
  Filled 2011-12-16: qty 2

## 2011-12-16 NOTE — Progress Notes (Addendum)
Subjective:  4 bloody stools yesterday, none so far today Nausea and vomiting yesterday "all day" LLQ pain Skipped breakfast today  Objective: Vital signs in last 24 hours: Temp:  [98.3 F (36.8 C)-99.4 F (37.4 C)] 98.6 F (37 C) (03/02 0543) Pulse Rate:  [87-106] 87  (03/02 0543) Resp:  [15-18] 15  (03/02 0543) BP: (99-122)/(54-66) 99/54 mmHg (03/02 0543) SpO2:  [92 %-100 %] 95 % (03/02 0543) Last BM Date: 12/15/11 General: NAD, cooperative Heart: regular rate and rythm Abdomen: soft, mild-mod tender LLQ  non-distended, bowel sounds + Ext: no edema   Intake/Output from previous day: 03/01 0701 - 03/02 0700 In: 800 [P.O.:480; I.V.:320] Out: 6 [Emesis/NG output:6]      Lab Results:  Basename 12/16/11 0600 12/15/11 0700 12/14/11 0603  WBC 23.7* 17.5* 17.7*  HGB 12.4* 12.9* 13.4  PLT 495* 494* 508*  MCV 82.8 82.9 83.2   BMET  Basename 12/16/11 0600 12/15/11 0700 12/14/11 0603  NA 137 136 136  K 3.6 4.0 3.8  CL 94* 93* 93*  CO2 32 31 30  GLUCOSE 125* 150* 185*  BUN 20 18 16   CREATININE 0.91 0.95 0.94  CALCIUM 9.2 9.3 9.3      Assessment/Plan: 31 y.o. male with:  1) Left UC flare - persisting in face of high dose steroids > 1 week 2) Nausea and vomiting, presume due to #1 but not clear   Will increase Asacol may help (4.8g/day) - doubt he would retain enema Change diet to full liquids due to nausea and vomiting Dr. Benson [place] conisdering Remicade - await his return Mon Patient reports he took Remicade in 2006 Nutrition consult Increase IVF's   Silvano Rusk, MD  12/16/2011, 10:07 AM [facility] Pager [phone]

## 2011-12-16 NOTE — Progress Notes (Signed)
Subjective: persistent bloody diarrhea slightly worse last night  Objective: Vital signs in last 24 hours: Temp:  [98.3 F (36.8 C)-99.4 F (37.4 C)] 98.6 F (37 C) (03/02 0543) Pulse Rate:  [87-106] 87  (03/02 0543) Resp:  [15-18] 15  (03/02 0543) BP: (99-122)/(54-66) 99/54 mmHg (03/02 0543) SpO2:  [92 %-100 %] 95 % (03/02 0543) Weight change:  Last BM Date: 12/15/11  Patient Vitals for the past 24 hrs:  BP Temp Temp src Pulse Resp SpO2  12/16/11 0543 99/54 mmHg 98.6 F (37 C) Oral 87  15  95 %  12/15/11 2132 117/63 mmHg 99.4 F (37.4 C) Oral 106  18  94 %  12/15/11 1703 122/66 mmHg 98.4 F (36.9 C) Oral 96  18  92 %  12/15/11 1303 115/61 mmHg 98.3 F (36.8 C) Oral 100  17  100 %    Intake/Output from previous day: 03/01 0701 - 03/02 0700 In: 800 [P.O.:480; I.V.:320] Out: 6 [Emesis/NG output:6]   Alert and oriented x3 HEENT: S1S2/RRR Lungs: clear Abd: Soft, mild lower quadrant tenderness, bowel sounds present Ext: no edema   Pathology  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    Blood cultures and urine culture from 12/11/11 pending   Lab Results:  Basename 12/16/11 0600 12/15/11 0700  WBC 23.7* 17.5*  HGB 12.4* 12.9*  HCT 36.6* 37.9*  PLT 495* 494*    Basename 12/16/11 0600 12/15/11 0700  NA 137 136  K 3.6 4.0  CL 94* 93*  CO2 32 31  GLUCOSE 125* 150*  BUN 20 18  CREATININE 0.91 0.95  CALCIUM 9.2 9.3   Recent Results (from the past 240 hour(s))  CULTURE, BLOOD (ROUTINE X 2)     Status: Normal (Preliminary result)   Collection Time   12/11/11  8:51 PM      Component Value Range Status Comment   Specimen Description BLOOD LEFT HAND   Final    Special Requests BOTTLES DRAWN AEROBIC ONLY 4CC   Final    Culture  Setup Time [id number]   Final    Culture     Final    Value:        BLOOD CULTURE RECEIVED NO GROWTH TO DATE CULTURE WILL BE HELD FOR 5 DAYS BEFORE ISSUING A FINAL NEGATIVE REPORT   Report Status  PENDING   Incomplete   CULTURE, BLOOD (ROUTINE X 2)     Status: Normal (Preliminary result)   Collection Time   12/11/11  9:03 PM      Component Value Range Status Comment   Specimen Description BLOOD RIGHT HAND   Final    Special Requests BOTTLES DRAWN AEROBIC ONLY 5CC   Final    Culture  Setup Time [id number]   Final    Culture     Final    Value:        BLOOD CULTURE RECEIVED NO GROWTH TO DATE CULTURE WILL BE HELD FOR 5 DAYS BEFORE ISSUING A FINAL NEGATIVE REPORT   Report Status PENDING   Incomplete   CLOSTRIDIUM DIFFICILE BY PCR     Status: Normal   Collection Time   12/12/11  5:58 AM      Component Value Range Status Comment   C difficile by pcr NEGATIVE  NEGATIVE  Final   EHEC TOXIN BY EIA, STOOL     Status: Normal   Collection Time   12/12/11  5:58 AM      Component Value Range Status Comment  Specimen Description STOOL   Final    Special Requests ADDED AT 2863 PER RENEE BROWN RN ON 6700 AT 8177   Final    EHEC Toxin by EIA Negative for Shiga toxins I and II   Final    Report Status 12/13/2011 FINAL   Final   URINE CULTURE     Status: Normal   Collection Time   12/12/11  5:59 AM      Component Value Range Status Comment   Specimen Description URINE, RANDOM   Final    Special Requests NONE   Final    Culture  Setup Time [id number]   Final    Colony Count 7,000 COLONIES/ML   Final    Culture INSIGNIFICANT GROWTH   Final    Report Status 12/13/2011 FINAL   Final      Studies/Results: No results found.  Medications: Scheduled Meds:    . mesalamine  1,600 mg Oral TID  . mesalamine  800 mg Oral Once  . methylPREDNISolone (SOLU-MEDROL) injection  40 mg Intravenous Q12H  . ondansetron (ZOFRAN) IV  4 mg Intravenous Q6H   Or  . ondansetron  4 mg Oral Q6H  . risperiDONE  1 mg Oral Daily  . DISCONTD: sodium chloride   Intravenous Once  . DISCONTD: mesalamine  800 mg Oral TID   Continuous Infusions:    . 0.9 % NaCl with KCl 40 mEq / L 20 mL/hr at 12/15/11 1900    PRN Meds:.acetaminophen, acetaminophen, alum & mag hydroxide-simeth, HYDROmorphone, oxyCODONE, promethazine, zolpidem  Assessment/Plan:  1.ULCERATIVE COLITIS flare Followed by Dr.Hung C. Difficile PCR was negative. s/p flexible sigmoidoscopy on 12/05/11, which revealed a severe colitis from the dentate line up to the splenic flexure region. Cold biopsies were obtained. Pathology confirmed UC.  Continue Solu-Medrol and Asacol, as well as supportive measures., Dr.Hung considering Remicaide Started on full liquid to advance as tol 2. Leukocytosis: suspect related to inflammation from UC flare and de-margination from steroids, fluctuating, if worsens may need repeat CT Afebrile for >4 days, C. difficile PCR negative, CT abdomen pelvis did not reveal any abscess or abnormality besides colitis 3. hyperglycemia secondary to steroids.   LOS: 15 days   [name] 12/16/2011, 12:15 PM

## 2011-12-16 NOTE — Progress Notes (Signed)
INITIAL ADULT NUTRITION ASSESSMENT Date: 12/16/2011   Time: 11:26 AM  Reason for Assessment: MD Consult  ASSESSMENT: Male 31 y.o.  Dx: Ulcerative colitis, unspecified  Hx:  Past Medical History  Diagnosis Date  . Ulcerative colitis   . Schizophrenia    Related Meds:  Scheduled Meds:   . mesalamine  1,600 mg Oral TID  . mesalamine  800 mg Oral Once  . methylPREDNISolone (SOLU-MEDROL) injection  40 mg Intravenous Q12H  . ondansetron (ZOFRAN) IV  4 mg Intravenous Q6H   Or  . ondansetron  4 mg Oral Q6H  . risperiDONE  1 mg Oral Daily  . DISCONTD: sodium chloride   Intravenous Once  . DISCONTD: mesalamine  800 mg Oral TID   Continuous Infusions:   . 0.9 % NaCl with KCl 40 mEq / L 20 mL/hr at 12/15/11 1900   PRN Meds:.acetaminophen, acetaminophen, alum & mag hydroxide-simeth, HYDROmorphone, oxyCODONE, promethazine, zolpidem   Ht: 6' (182.9 cm)  Wt: 166 lb 14.2 oz (75.7 kg)  Ideal Wt: 80.9 kg % Ideal Wt: 94%  Usual Wt: 200 lb (1 month ago) % Usual Wt: 83% (17% weight loss in 1 month, severe weight loss)  Body mass index is 22.63 kg/(m^2). Normal weight.  Food/Nutrition Related Hx: No nutrition problems identified on admission nutrition screen.  Labs:  CMP     Component Value Date/Time   NA 137 12/16/2011 0600   K 3.6 12/16/2011 0600   CL 94* 12/16/2011 0600   CO2 32 12/16/2011 0600   GLUCOSE 125* 12/16/2011 0600   BUN 20 12/16/2011 0600   CREATININE 0.91 12/16/2011 0600   CALCIUM 9.2 12/16/2011 0600   PROT 6.0 12/03/2011 0645   ALBUMIN 2.9* 12/03/2011 0645   AST 9 12/03/2011 0645   ALT 7 12/03/2011 0645   ALKPHOS 57 12/03/2011 0645   BILITOT 0.4 12/03/2011 0645   GFRNONAA >90 12/16/2011 0600   GFRAA >90 12/16/2011 0600    Intake/Output Summary (Last 24 hours) at 12/16/11 1132 Last data filed at 12/16/11 0700  Gross per 24 hour  Intake    680 ml  Output      6 ml  Net    674 ml    Diet Order: RN advanced to Regular diet at patient's request.  Supplements/Tube Feeding:   None  IVF:    0.9 % NaCl with KCl 40 mEq / L Last Rate: 20 mL/hr at 12/15/11 1900    Estimated Nutritional Needs:   Kcal: 2300-2500 Protein: 100-120 grams Fluid: 2.3-2.5 liters  Per RN, patient is angry that MD changed his diet to full liquids this morning; RN advanced diet to regular; patient says he feels much better than he did yesterday.    PO intake has been variable; 0-50% meal completion yesterday due to nausea and vomiting all day; 25-100% meal completion on previous days. Refused breakfast this morning due to abdominal pain.  Patient reports that he weighed 200 lb at the beginning of February.  Has lost 34 lb in one month.  Patient says he started losing weight when his GI distress/colitis started.  Patient is at nutrition risk with 17% weight loss in one month; also reports decreased muscle strength.  Patient with severe malnutrition in the context of acute illness (ulcerative colitis flare), given 17% weight loss and reported moderate muscle loss per discussion with patient.  NUTRITION DIAGNOSIS: -Impaired nutrient utilization (NI-2.1).  Status: Ongoing  RELATED TO: altered GI function  AS EVIDENCED BY: 17% weight loss in one  month  MONITORING/EVALUATION(Goals): Patient to consume >75% of meals and snacks to meet nutrition needs.  EDUCATION NEEDS: -Education needs addressed; provided patient with handouts; encouraged intake of low fat and low fiber foods during acute ulcerative colitis flare.  INTERVENTION:  Recommend low fat, low fiber diet with small, frequent meals during this acute flare of ulcerative colitis.  Will send snacks TID between meals to maximize oral intake.  Multivitamin PO daily.  Dietitian #:  [phone]  [place] Per approved criteria  -Severe malnutrition in the context of acute illness    Dalene Carrow 12/16/2011, 11:26 AM

## 2011-12-16 NOTE — Progress Notes (Signed)
[id number] Pt. Wants regular diet per md advise to have full liquid for lunch and regular at dinner if he tolerates it. Pt claims he is much better today than yesterday further claims he chose not to eat yesterday due to nausea and vomitting.

## 2011-12-17 LAB — CBC
MCH: 27.6 pg (ref 26.0–34.0)
MCHC: 33.1 g/dL (ref 30.0–36.0)
Platelets: 461 10*3/uL — ABNORMAL HIGH (ref 150–400)
RDW: 14.9 % (ref 11.5–15.5)

## 2011-12-17 LAB — BASIC METABOLIC PANEL
Calcium: 8.6 mg/dL (ref 8.4–10.5)
GFR calc Af Amer: >90 mL/min (ref >90)
GFR calc non Af Amer: >90 mL/min (ref >90)
Potassium: 4 mEq/L (ref 3.5–5.1)
Sodium: 134 mEq/L — ABNORMAL LOW (ref 135–145)

## 2011-12-17 NOTE — Progress Notes (Signed)
Subjective: persistent bloody diarrhea slightly worse last night  Objective: Vital signs in last 24 hours: Temp:  [98.4 F (36.9 C)-99.2 F (37.3 C)] 98.5 F (36.9 C) (03/03 0906) Pulse Rate:  [84-114] 106  (03/03 0906) Resp:  [17-20] 18  (03/03 0906) BP: (107-124)/(51-74) 113/51 mmHg (03/03 0906) SpO2:  [96 %-99 %] 99 % (03/03 0906) Weight:  [80 kg (176 lb 5.9 oz)] 80 kg (176 lb 5.9 oz) (03/02 2043) Weight change:  Last BM Date: 12/16/11  Patient Vitals for the past 24 hrs:  BP Temp Temp src Pulse Resp SpO2 Weight  12/17/11 0906 113/51 mmHg 98.5 F (36.9 C) Oral 106  18  99 % -  12/17/11 0518 111/63 mmHg 98.4 F (36.9 C) Oral 84  18  99 % -  12/16/11 2043 107/59 mmHg 99.2 F (37.3 C) Oral 90  20  96 % 80 kg (176 lb 5.9 oz)  12/16/11 1630 115/74 mmHg 98.6 F (37 C) Oral 96  18  96 % -  12/16/11 1350 124/70 mmHg 98.9 F (37.2 C) Oral 114  17  96 % -    Intake/Output from previous day: 03/02 0701 - 03/03 0700 In: 1980 [P.O.:1080; I.V.:900] Out: -  Total I/O In: 540 [P.O.:240; I.V.:300] Out: -  Alert and oriented x3 HEENT: S1S2/RRR Lungs: clear Abd: Soft, mild lower quadrant tenderness, bowel sounds present Ext: no edema   Pathology  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    Blood cultures and urine culture from 12/11/11 pending   Lab Results:  Basename 12/17/11 0615 12/16/11 0600  WBC 21.2* 23.7*  HGB 11.1* 12.4*  HCT 33.5* 36.6*  PLT 461* 495*    Basename 12/17/11 0615 12/16/11 0600  NA 134* 137  K 4.0 3.6  CL 97 94*  CO2 29 32  GLUCOSE 136* 125*  BUN 14 20  CREATININE 0.80 0.91  CALCIUM 8.6 9.2   Recent Results (from the past 240 hour(s))  CULTURE, BLOOD (ROUTINE X 2)     Status: Normal (Preliminary result)   Collection Time   12/11/11  8:51 PM      Component Value Range Status Comment   Specimen Description BLOOD LEFT HAND   Final    Special Requests BOTTLES DRAWN AEROBIC ONLY 4CC   Final    Culture   Setup Time [id number]   Final    Culture     Final    Value:        BLOOD CULTURE RECEIVED NO GROWTH TO DATE CULTURE WILL BE HELD FOR 5 DAYS BEFORE ISSUING A FINAL NEGATIVE REPORT   Report Status PENDING   Incomplete   CULTURE, BLOOD (ROUTINE X 2)     Status: Normal (Preliminary result)   Collection Time   12/11/11  9:03 PM      Component Value Range Status Comment   Specimen Description BLOOD RIGHT HAND   Final    Special Requests BOTTLES DRAWN AEROBIC ONLY 5CC   Final    Culture  Setup Time [id number]   Final    Culture     Final    Value:        BLOOD CULTURE RECEIVED NO GROWTH TO DATE CULTURE WILL BE HELD FOR 5 DAYS BEFORE ISSUING A FINAL NEGATIVE REPORT   Report Status PENDING   Incomplete   CLOSTRIDIUM DIFFICILE BY PCR     Status: Normal   Collection Time   12/12/11  5:58 AM      Component  Value Range Status Comment   C difficile by pcr NEGATIVE  NEGATIVE  Final   EHEC TOXIN BY EIA, STOOL     Status: Normal   Collection Time   12/12/11  5:58 AM      Component Value Range Status Comment   Specimen Description STOOL   Final    Special Requests ADDED AT 7106 PER RENEE BROWN RN ON 6700 AT 2694   Final    EHEC Toxin by EIA Negative for Shiga toxins I and II   Final    Report Status 12/13/2011 FINAL   Final   URINE CULTURE     Status: Normal   Collection Time   12/12/11  5:59 AM      Component Value Range Status Comment   Specimen Description URINE, RANDOM   Final    Special Requests NONE   Final    Culture  Setup Time [id number]   Final    Colony Count 7,000 COLONIES/ML   Final    Culture INSIGNIFICANT GROWTH   Final    Report Status 12/13/2011 FINAL   Final      Studies/Results: No results found.  Medications: Scheduled Meds:    . mesalamine  1,600 mg Oral TID  . methylPREDNISolone (SOLU-MEDROL) injection  40 mg Intravenous Q12H  . ondansetron (ZOFRAN) IV  4 mg Intravenous Q6H   Or  . ondansetron  4 mg Oral Q6H  . risperiDONE  1 mg Oral Daily   Continuous  Infusions:    . 0.9 % NaCl with KCl 40 mEq / L 75 mL/hr at 12/17/11 1112   PRN Meds:.acetaminophen, acetaminophen, alum & mag hydroxide-simeth, HYDROmorphone, oxyCODONE, promethazine, zolpidem  Assessment/Plan:  1.ULCERATIVE COLITIS flare Followed by Dr.Hung C. Difficile PCR was negative. s/p flexible sigmoidoscopy on 12/05/11, which revealed a severe colitis from the dentate line up to the splenic flexure region. Cold biopsies were obtained. Pathology confirmed UC.  Continue Solu-Medrol and Asacol, as well as supportive measures., Dr.Hung considering Remicaide  2. Leukocytosis: suspect related to inflammation from UC flare and de-margination from steroids, fluctuating Afebrile for >5 days, C. difficile PCR negative, CT abdomen pelvis did not reveal any abscess or abnormality besides colitis 3. hyperglycemia secondary to steroids.   LOS: 16 days   [name] 12/17/2011, 12:23 PM

## 2011-12-17 NOTE — Progress Notes (Signed)
Patient ID: [name], male   DOB: [birth year], 31 y.o.   MRN: [id number] [facility] Progress Note  Subjective: Feels better today than yesterday-no nausea/vomiting. He has had 2 bm's today brown mixed with blood. He is still having pain-sharp at times and using Dilaudid  Objective:  Vital signs in last 24 hours: Temp:  [98.4 F (36.9 C)-99.2 F (37.3 C)] 98.5 F (36.9 C) (03/03 0906) Pulse Rate:  [84-114] 106  (03/03 0906) Resp:  [17-20] 18  (03/03 0906) BP: (107-124)/(51-74) 113/51 mmHg (03/03 0906) SpO2:  [96 %-99 %] 99 % (03/03 0906) Weight:  [176 lb 5.9 oz (80 kg)] 176 lb 5.9 oz (80 kg) (03/02 2043) Last BM Date: 12/16/11 General:   Alert,  Well-developed,    in NAD Heart:  Regular rate and rhythm; no murmurs Pulm;clear Abdomen:  Soft, tender lower abdomen and LLQ and nondistended. Normal bowel sounds and without rebound.   Extremities:  Without edema. Neurologic:  Alert and  oriented x4;  grossly normal neurologically. Psych:  Alert and cooperative. Normal mood and affect.  Intake/Output from previous day: 03/02 0701 - 03/03 0700 In: 1980 [P.O.:1080; I.V.:900] Out: -  Intake/Output this shift: Total I/O In: 240 [P.O.:240] Out: -   Lab Results:  Basename 12/17/11 0615 12/16/11 0600 12/15/11 0700  WBC 21.2* 23.7* 17.5*  HGB 11.1* 12.4* 12.9*  HCT 33.5* 36.6* 37.9*  PLT 461* 495* 494*   BMET  Basename 12/17/11 0615 12/16/11 0600 12/15/11 0700  NA 134* 137 136  K 4.0 3.6 4.0  CL 97 94* 93*  CO2 29 32 31  GLUCOSE 136* 125* 150*  BUN 14 20 18   CREATININE 0.80 0.91 0.95  CALCIUM 8.6 9.2 9.3     Assessment / Plan: 31 yo male with severe exacerbation of ulcerative colitis-  On high dose steroids, leukocytosis persists. Continue current regimen, Dr Benson Norway to decide regarding initiation of Remicade.     LOS: 16 days   [name]  12/17/2011, 11:36 AM    [facility] MD  Patient seen and evaluated also.  No nausea and vomiting since  yesterday. Still with bloody stools though some brown. WBC decreased though has fluctuated. Seems improved - ? If persistent enough. PPD site looks negative to me - did not see where filed in chart. Dr. Benson Norway to follow-up tomorrow.  Keith Mayer, MD, [facility] [phone] (pager) 12/17/2011 3:39 PM

## 2011-12-18 ENCOUNTER — Emergency Department (HOSPITAL_COMMUNITY)
Admission: EM | Admit: 2011-12-18 | Discharge: 2011-12-18 | Discharge status: Against Medical Advice | Payer: Medicare Other | Attending: Emergency Medicine | Admitting: Emergency Medicine

## 2011-12-18 ENCOUNTER — Encounter (HOSPITAL_COMMUNITY): Payer: Self-pay | Admitting: Emergency Medicine

## 2011-12-18 DIAGNOSIS — R1110 Vomiting, unspecified: Secondary | ICD-10-CM | POA: Insufficient documentation

## 2011-12-18 LAB — BASIC METABOLIC PANEL
CO2: 29 mEq/L (ref 19–32)
Calcium: 8.5 mg/dL (ref 8.4–10.5)
Potassium: 4.2 mEq/L (ref 3.5–5.1)
Sodium: 135 mEq/L (ref 135–145)

## 2011-12-18 LAB — CBC
MCH: 28.2 pg (ref 26.0–34.0)
Platelets: 444 10*3/uL — ABNORMAL HIGH (ref 150–400)
RBC: 3.86 MIL/uL — ABNORMAL LOW (ref 4.22–5.81)
WBC: 21.5 10*3/uL — ABNORMAL HIGH (ref 4.0–10.5)

## 2011-12-18 LAB — CULTURE, BLOOD (ROUTINE X 2)
Culture  Setup Time: 201302260142
Culture: NO GROWTH

## 2011-12-18 MED ORDER — PREDNISONE (PAK) 10 MG PO TABS: 40.0000 mg | ORAL_TABLET | Freq: Every day | ORAL | Status: DC

## 2011-12-18 MED ORDER — PANTOPRAZOLE SODIUM 40 MG PO TBEC: 40.0000 mg | DELAYED_RELEASE_TABLET | Freq: Every day | ORAL | Status: DC

## 2011-12-18 MED ORDER — MESALAMINE 400 MG PO TBEC: 1600.0000 mg | DELAYED_RELEASE_TABLET | Freq: Three times a day (TID) | ORAL | Status: DC

## 2011-12-18 MED ORDER — PROMETHAZINE HCL 12.5 MG PO TABS: 12.5000 mg | ORAL_TABLET | Freq: Four times a day (QID) | ORAL | Status: DC | PRN

## 2011-12-18 MED ORDER — OXYCODONE HCL 5 MG PO TABS: 5.0000 mg | ORAL_TABLET | ORAL | Status: DC | PRN

## 2011-12-18 NOTE — Progress Notes (Signed)
Patient ID: [name], male   DOB: 26-Jun-1981, 31 y.o.   MRN: [id number] Subjective: Four bowel movements.  Minimal blood and stools are forming.  Objective: Vital signs in last 24 hours: Temp:  [97.6 F (36.4 C)-98.7 F (37.1 C)] 97.6 F (36.4 C) (03/04 0508) Pulse Rate:  [91-106] 98  (03/04 0508) Resp:  [18] 18  (03/04 0508) BP: (96-146)/(44-77) 109/56 mmHg (03/04 0508) SpO2:  [97 %-99 %] 98 % (03/04 0508) Weight:  [81.1 kg (178 lb 12.7 oz)] 81.1 kg (178 lb 12.7 oz) (03/03 2035) Last BM Date: 12/17/11  Intake/Output from previous day: 03/03 0701 - 03/04 0700 In: 2685 [P.O.:960; I.V.:1725] Out: 50 [Stool:50] Intake/Output this shift:    General appearance: alert and no distress GI: soft, non-tender; bowel sounds normal; no masses,  no organomegaly  Lab Results:  Basename 12/18/11 0712 12/17/11 0615 12/16/11 0600  WBC 21.5* 21.2* 23.7*  HGB 10.9* 11.1* 12.4*  HCT 32.5* 33.5* 36.6*  PLT 444* 461* 495*   BMET  Basename 12/18/11 0712 12/17/11 0615 12/16/11 0600  NA 135 134* 137  K 4.2 4.0 3.6  CL 100 97 94*  CO2 29 29 32  GLUCOSE 137* 136* 125*  BUN 8 14 20   CREATININE 0.63 0.80 0.91  CALCIUM 8.5 8.6 9.2   LFT No results found for this basename: PROT,ALBUMIN,AST,ALT,ALKPHOS,BILITOT,BILIDIR,IBILI in the last 72 hours PT/INR No results found for this basename: LABPROT:2,INR:2 in the last 72 hours Hepatitis Panel No results found for this basename: HEPBSAG,HCVAB,HEPAIGM,HEPBIGM in the last 72 hours C-Diff No results found for this basename: CDIFFTOX:3 in the last 72 hours Fecal Lactopherrin No results found for this basename: FECLLACTOFRN in the last 72 hours  Studies/Results: No results found.  Medications:  Scheduled:   . mesalamine  1,600 mg Oral TID  . methylPREDNISolone (SOLU-MEDROL) injection  40 mg Intravenous Q12H  . ondansetron (ZOFRAN) IV  4 mg Intravenous Q6H   Or  . ondansetron  4 mg Oral Q6H  . risperiDONE  1 mg Oral Daily    Continuous:   . 0.9 % NaCl with KCl 40 mEq / L 75 mL/hr at 12/17/11 2318    Assessment/Plan: 1) Severe left-sided UC.   I think the patient is okay to be D/C'ed with close follow up as an outpatient.    Plan: 1) Prednisone 40 mg QD. 2) Asacol 800 mg two tabs TID. 3) Follow up in 1-2 weeks in the office.  LOS: 17 days   [name] D 12/18/2011, 8:06 AM

## 2011-12-18 NOTE — ED Notes (Signed)
Patient called for third time with no response.

## 2011-12-18 NOTE — ED Notes (Signed)
Patient left AMA.

## 2011-12-18 NOTE — ED Notes (Signed)
Unable to find pt in waiting room or triage

## 2011-12-18 NOTE — ED Notes (Signed)
PT. REPORTS PERSISTENT VOMITTING WITH BLOODY STOOLS AND DIZZINESS ONSET FEB. 15 ,2013 , [place].

## 2011-12-18 NOTE — Discharge Summary (Signed)
Physician Discharge Summary  Patient ID: [name] MRN: [id number] DOB/AGE: 04-27-1981 31 y.o.  Admit date: 12/01/2011 Discharge date: 12/18/2011  Primary Care Physician:  [facility]ologist: Dr. Benson Norway  Discharge Diagnoses:   1. ULCERATIVE COLITIS flare 2. generalized abdominal pain 3. history of schizophrenia 4. Leukocytosis 5. hypokalemia  Medication List  As of 12/18/2011  3:13 PM   STOP taking these medications         risperiDONE 1 MG tablet         TAKE these medications         mesalamine 400 MG EC tablet   Commonly known as: ASACOL   Take 4 tablets (1,600 mg total) by mouth 3 (three) times daily.      oxyCODONE 5 MG immediate release tablet   Commonly known as: Oxy IR/ROXICODONE   Take 1 tablet (5 mg total) by mouth every 4 (four) hours as needed.      pantoprazole 40 MG tablet   Commonly known as: PROTONIX   Take 1 tablet (40 mg total) by mouth at bedtime.      predniSONE 10 MG tablet   Commonly known as: STERAPRED UNI-PAK   Take 4 tablets (40 mg total) by mouth daily.      promethazine 12.5 MG tablet   Commonly known as: PHENERGAN   Take 1 tablet (12.5 mg total) by mouth every 6 (six) hours as needed for nausea.             Disposition and Follow-up:  Dr.Hung in 1-2 weeks  Consults: Dr.Hung GI   Significant Diagnostic Studies:  Ct Abdomen Pelvis W Contrast 12/01/2011  IMPRESSION:  1.  Left-sided colitis.  Likely related to the clinical history of ulcerative colitis.  No obstruction or other complication. 2.  Extensive pericolonic and less so retroperitoneal adenopathy. Similar since 12/17/2009, therefore likely reactive.  Original Report Authenticated By: Areta Haber, M.D.    Brief H and P: [name] is an 31 y.o. male who presents with complaints of generalized severe 120/10 sharp crampy intermittent ABD pain associated with bloody Diarrhea, and nausea and vomiting for the past 3 days.   Hospital Course:  1.ULCERATIVE  COLITIS flare  s/p flexible sigmoidoscopy on 12/05/11, which revealed a severe colitis from the dentate line up to the splenic flexure region. Cold biopsies were obtained. Pathology confirmed ulcerative colitis .  Treated with Solu-Medrol and Asacol, as well as supportive measures., Clinically improved with the above management, being discharged home on 40 mg of prednisone and Asacol to followup with Dr. Benson Norway in 1-2 weeks, Dr.Hung also considering Remicaide. Still having some bloody diarrhea however significantly improved. 2. Leukocytosis: related to inflammation from UC flare and de-margination from steroids. Afebrile for >5 days, C. difficile PCR negative, CT abdomen pelvis did not reveal any abscess or abnormality besides colitis     Time spent on Discharge: 8mn  Signed: Stuart Guillen [facility]  12/18/2011, 3:13 PM

## 2011-12-18 NOTE — Progress Notes (Signed)
   CARE MANAGEMENT NOTE 12/18/2011  Patient:  [name], [name]   Account Number:  [id number]  Date Initiated:  12/05/2011  Documentation initiated by:  Felix Ahmadi Assessment:   31 yr-old male adm with ulcerative colitis; lives with mother, independent PTA.     Action/Plan:   Anticipated DC Date:  12/16/2011   Anticipated DC Plan:  [place]  CM consult  GCCN / P4HM (established/new)  Follow-up appt scheduled  Medication Assistance      Choice offered to / List presented to:             Status of service:   Medicare Important Message given?   (If response is "NO", the following Medicare IM given date fields will be blank) Date Medicare IM given:   Date Additional Medicare IM given:    Discharge Disposition:  HOME/SELF CARE  Per UR Regulation:    Comments:  3/4 filled several of pt's meds at [facility].checked w [facility] and they have not processed his request for med yet.  debbie Joyous Gleg[place] rn,bsn  12/11/11 1340 [place] RN MSN CCM TC to pt's mother, [name] ([phone]) per his request.  She has financial information to fax to pharmaceutical company and will bring to hospital.  12/07/11 [place] MSN CCM Pt has [facility] application provided by [facility] CM.  Pt requests assistance with appts - eligibility appt arranged for Thursday, 01/04/12 @ 2:15 p.m., appt with Dr. Jarold Song Wednesday, 01/10/12 @ 10:30 a.m.  Contact and appt information given to pt.  Pt provided with patient assistance program application for mesalamine.  He is to complete his portion of the application so CM can fax to pharmaceutical company.  Per pharmacy, pt will qualify for ZZ funds and can get 100 tablets of sulfasalazine when d/c'd.  12/05/11 1230 [place] MSN CCM Received referral for medication assistance.  Per pt, his primary care was @ [facility], he has been in prison and needs to renew his orange card.  TC to [facility]  CM who will f/u with pt.

## 2011-12-19 ENCOUNTER — Emergency Department (HOSPITAL_COMMUNITY)
Admission: EM | Admit: 2011-12-19 | Discharge: 2011-12-19 | Disposition: A | Discharge status: Home / Self Care | Payer: Medicare Other | Attending: Emergency Medicine | Admitting: Emergency Medicine

## 2011-12-19 ENCOUNTER — Encounter (HOSPITAL_COMMUNITY): Payer: Self-pay | Admitting: Emergency Medicine

## 2011-12-19 DIAGNOSIS — F17200 Nicotine dependence, unspecified, uncomplicated: Secondary | ICD-10-CM | POA: Insufficient documentation

## 2011-12-19 DIAGNOSIS — R1110 Vomiting, unspecified: Secondary | ICD-10-CM | POA: Insufficient documentation

## 2011-12-19 DIAGNOSIS — F209 Schizophrenia, unspecified: Secondary | ICD-10-CM | POA: Insufficient documentation

## 2011-12-19 DIAGNOSIS — R197 Diarrhea, unspecified: Secondary | ICD-10-CM

## 2011-12-19 DIAGNOSIS — N390 Urinary tract infection, site not specified: Secondary | ICD-10-CM

## 2011-12-19 DIAGNOSIS — K5190 Ulcerative colitis, unspecified, without complications: Secondary | ICD-10-CM | POA: Insufficient documentation

## 2011-12-19 LAB — BASIC METABOLIC PANEL
BUN: 12 mg/dL (ref 6–23)
CO2: 30 mEq/L (ref 19–32)
Calcium: 8.8 mg/dL (ref 8.4–10.5)
Chloride: 98 mEq/L (ref 96–112)
Creatinine, Ser: 0.81 mg/dL (ref 0.50–1.35)
GFR calc Af Amer: >90 mL/min (ref >90)
GFR calc non Af Amer: >90 mL/min (ref >90)
Glucose, Bld: 100 mg/dL — ABNORMAL HIGH (ref 70–99)
Potassium: 3.4 mEq/L — ABNORMAL LOW (ref 3.5–5.1)
Sodium: 136 mEq/L (ref 135–145)

## 2011-12-19 LAB — CBC
HCT: 32.5 % — ABNORMAL LOW (ref 39.0–52.0)
Hemoglobin: 10.8 g/dL — ABNORMAL LOW (ref 13.0–17.0)
MCH: 28 pg (ref 26.0–34.0)
MCHC: 33.2 g/dL (ref 30.0–36.0)
MCV: 84.2 fL (ref 78.0–100.0)
Platelets: 454 10*3/uL — ABNORMAL HIGH (ref 150–400)
RBC: 3.86 MIL/uL — ABNORMAL LOW (ref 4.22–5.81)
RDW: 15.4 % (ref 11.5–15.5)
WBC: 21.4 10*3/uL — ABNORMAL HIGH (ref 4.0–10.5)

## 2011-12-19 LAB — URINALYSIS, ROUTINE W REFLEX MICROSCOPIC
Glucose, UA: NEGATIVE mg/dL
Hgb urine dipstick: NEGATIVE
Ketones, ur: 15 mg/dL — AB
Nitrite: POSITIVE — AB
Protein, ur: 100 mg/dL — AB
Specific Gravity, Urine: >1.03 — ABNORMAL HIGH (ref 1.005–1.030)
Urobilinogen, UA: 1 mg/dL (ref 0.0–1.0)
pH: 6 (ref 5.0–8.0)

## 2011-12-19 LAB — URINE MICROSCOPIC-ADD ON

## 2011-12-19 LAB — C-REACTIVE PROTEIN: CRP: 4.14 mg/dL — ABNORMAL HIGH (ref <0.60)

## 2011-12-19 MED ORDER — DEXTROSE 5 % IV SOLN
1.0000 g | Freq: Once | INTRAVENOUS | Status: AC
  Administered 2011-12-19: 1 g via INTRAVENOUS
  Filled 2011-12-19: qty 10

## 2011-12-19 MED ORDER — METHYLPREDNISOLONE SODIUM SUCC 125 MG IJ SOLR
80.0000 mg | Freq: Once | INTRAMUSCULAR | Status: AC
  Administered 2011-12-19: 80 mg via INTRAVENOUS
  Filled 2011-12-19: qty 2

## 2011-12-19 MED ORDER — SODIUM CHLORIDE 0.9 % IV BOLUS (SEPSIS)
1000.0000 mL | Freq: Once | INTRAVENOUS | Status: AC
  Administered 2011-12-19: 1000 mL via INTRAVENOUS

## 2011-12-19 MED ORDER — HYDROMORPHONE HCL PF 1 MG/ML IJ SOLN
1.0000 mg | Freq: Once | INTRAMUSCULAR | Status: AC
  Administered 2011-12-19: 1 mg via INTRAVENOUS
  Filled 2011-12-19: qty 1

## 2011-12-19 MED ORDER — ONDANSETRON HCL 4 MG/2ML IJ SOLN
4.0000 mg | Freq: Once | INTRAMUSCULAR | Status: AC
  Administered 2011-12-19: 4 mg via INTRAVENOUS
  Filled 2011-12-19: qty 2

## 2011-12-19 MED ORDER — CEPHALEXIN 500 MG PO CAPS: 500.0000 mg | ORAL_CAPSULE | Freq: Four times a day (QID) | ORAL | Status: AC

## 2011-12-19 NOTE — ED Provider Notes (Signed)
History    31 year old male with vomiting and bloody diarrhea. Patient has a history of ulcerative colitis, biopsy confirmed. Was just admitted and discharged the hospital for the same. Returning because of continued symptoms. No fevers or chills. Diffuse abdominal pain but worse in the left lower quadrant. Several episodes of bloody diarrhea. Nonbilious, nonbloody vomiting. No urinary complaints.  CSN: [id number]  Arrival date & time 12/19/11  0321   First MD Initiated Contact with Patient 12/19/11 [phone]      Chief Complaint  Patient presents with  . Emesis    (Consider location/radiation/quality/duration/timing/severity/associated sxs/prior treatment) HPI  Past Medical History  Diagnosis Date  . Ulcerative colitis   . Schizophrenia     Past Surgical History  Procedure Date  . Mandible fracture surgery   . Flexible sigmoidoscopy 12/05/2011    Procedure: FLEXIBLE SIGMOIDOSCOPY;  Surgeon: Beryle Beams, MD;  Location: [facility] ENDOSCOPY;  Service: Endoscopy;  Laterality: N/A;    No family history on file.  History  Substance Use Topics  . Smoking status: Current Everyday Smoker  . Smokeless tobacco: Not on file  . Alcohol Use: No      Review of Systems   Review of symptoms negative unless otherwise noted in HPI.   Allergies  Review of patient's allergies indicates no known allergies.  Home Medications   Current Outpatient Rx  Name Route Sig Dispense Refill  . MESALAMINE 400 MG PO TBEC Oral Take 1,600 mg by mouth 3 (three) times daily.    . OXYCODONE HCL 5 MG PO TABS Oral Take 5 mg by mouth every 4 (four) hours as needed. For pain.    Marland Kitchen PANTOPRAZOLE SODIUM 40 MG PO TBEC Oral Take 40 mg by mouth at bedtime.    Marland Kitchen PREDNISONE (PAK) 10 MG PO TABS Oral Take 40 mg by mouth daily.    Marland Kitchen PROMETHAZINE HCL 12.5 MG PO TABS Oral Take 12.5 mg by mouth every 6 (six) hours as needed. For nausea      BP 120/70  Pulse 92  Temp(Src) 98 F (36.7 C) (Oral)  Resp 16  SpO2  97%  Physical Exam  Nursing note and vitals reviewed. Constitutional: He appears well-developed and well-nourished. No distress.  HENT:  Head: Normocephalic and atraumatic.  Eyes: Conjunctivae are normal. Right eye exhibits no discharge. Left eye exhibits no discharge.  Neck: Neck supple.  Cardiovascular: Normal rate, regular rhythm and normal heart sounds.  Exam reveals no gallop and no friction rub.   No murmur heard. Pulmonary/Chest: Effort normal and breath sounds normal. No respiratory distress.  Abdominal: Soft. He exhibits no distension. There is tenderness.       Mild diffuse abdominal tenderness without reboudn or guarding. No mass.  Musculoskeletal: He exhibits no edema and no tenderness.  Neurological: He is alert.  Skin: Skin is warm and dry.  Psychiatric: He has a normal mood and affect. His behavior is normal. Thought content normal.    ED Course  Procedures (including critical care time)  Labs Reviewed  CBC - Abnormal; Notable for the following:    WBC 21.4 (*)    RBC 3.86 (*)    Hemoglobin 10.8 (*)    HCT 32.5 (*)    Platelets 454 (*)    All other components within normal limits  BASIC METABOLIC PANEL - Abnormal; Notable for the following:    Potassium 3.4 (*) DELTA CHECK NOTED   Glucose, Bld 100 (*)    All other components within normal limits  URINALYSIS,  ROUTINE W REFLEX MICROSCOPIC - Abnormal; Notable for the following:    Color, Urine ORANGE (*) BIOCHEMICALS MAY BE AFFECTED BY COLOR   APPearance CLOUDY (*)    Specific Gravity, Urine >1.030 (*)    Bilirubin Urine SMALL (*)    Ketones, ur 15 (*)    Protein, ur 100 (*)    Nitrite POSITIVE (*)    Leukocytes, UA SMALL (*)    All other components within normal limits  URINE MICROSCOPIC-ADD ON - Abnormal; Notable for the following:    Bacteria, UA FEW (*)    Crystals CA OXALATE CRYSTALS (*)    All other components within normal limits  C-REACTIVE PROTEIN   No results found.   1. Diarrhea   2. UTI  (lower urinary tract infection)       MDM  31 year old male with vomiting and diarrhea. Patient has a history of ulcers colitis was just discharged after a recent flare. Has not followed up yet. Labs consistent with recent aside from UA which is consistent with a urinary tract infection. Patient was given a dose of ceftriaxone in the emergency room. Continued antibiotics with Keflex. Continue medications for ulcer colitis flare. Outpatient followup as recommended upon discharge.        Virgel Manifold, MD 12/25/11 [phone]

## 2011-12-19 NOTE — ED Notes (Signed)
Pt states that he was just discharged from the hospital yesterday and he received Rx. Medications that he cannot keep down. Pt states that his stomach has been hurting and he has been vomiting. Pt alert and oriented and able to follow commands and move extremities.

## 2011-12-19 NOTE — ED Notes (Signed)
PT. REPORTS INTERMITTENT VOMITTING WITH BLOODY DIARRHEA FOR SEVERAL WEEKS , STATES HISTORY OF ULCERATIVE COLITIS.

## 2011-12-19 NOTE — ED Notes (Signed)
MD at bedside. 

## 2011-12-19 NOTE — Discharge Instructions (Signed)
Bloody Diarrhea Bloody diarrhea can be caused by many different conditions. Most of the time bloody diarrhea is the result of food poisoning or minor infections. Bloody diarrhea usually improves over 2 to 3 days of rest and fluid replacement. Other conditions that can cause bloody diarrhea include:  Internal bleeding.   Infection.   Diseases of the bowel and colon.  Internal bleeding from an ulcer or bowel disease can be severe and requires hospital care or even surgery. DIAGNOSIS  To find out what is wrong your caregiver may check your:  Stool.   Blood.   Results from a test that looks inside the body (endoscopy).  TREATMENT   Get plenty of rest.   Drink enough water and fluids to keep your urine clear or pale yellow.   Do not smoke.   Solid foods and dairy products should be avoided until your illness improves.   As you improve, slowly return to a regular diet with easily-digested foods first. Examples are:   Bananas.   Rice.   Toast.   Crackers.  You should only need these for about 2 days before adding more normal foods to your diet.  Avoid spicy or fatty foods as well as caffeine and alcohol for several days.   Medicine to control cramping and diarrhea can relieve symptoms but may prolong some cases of bloody diarrhea. Antibiotics can speed recovery from diarrhea due to some bacterial infections. Call your caregiver if diarrhea does not get better in 3 days.  SEEK MEDICAL CARE IF:   You do not improve after 3 days.   Your diarrhea improves but your stool appears black.  SEEK IMMEDIATE MEDICAL CARE IF:   You become extremely weak or faint.   You become very sweaty.   You have increased pain or bleeding.   You develop repeated vomiting.   You vomit and you see blood or the vomit looks black in color.   You have a fever.  Document Released: 10/02/2005 Document Revised: 09/21/2011 Document Reviewed: 09/03/2009 [facility] Patient Information 2012 [place].Urinary Tract Infection Infections of the urinary tract can start in several places. A bladder infection (cystitis), a kidney infection (pyelonephritis), and a prostate infection (prostatitis) are different types of urinary tract infections (UTIs). They usually get better if treated with medicines (antibiotics) that kill germs. Take all the medicine until it is gone. You or your child may feel better in a few days, but TAKE ALL MEDICINE or the infection may not respond and may become more difficult to treat. HOME CARE INSTRUCTIONS   Drink enough water and fluids to keep the urine clear or pale yellow. Cranberry juice is especially recommended, in addition to large amounts of water.   Avoid caffeine, tea, and carbonated beverages. They tend to irritate the bladder.   Alcohol may irritate the prostate.   Only take over-the-counter or prescription medicines for pain, discomfort, or fever as directed by your caregiver.  To prevent further infections:  Empty the bladder often. Avoid holding urine for long periods of time.   After a bowel movement, women should cleanse from front to back. Use each tissue only once.   Empty the bladder before and after sexual intercourse.  FINDING OUT THE RESULTS OF YOUR TEST Not all test results are available during your visit. If your or your child's test results are not back during the visit, make an appointment with your caregiver to find out the results. Do not assume everything is normal if you have not  heard from your caregiver or the medical facility. It is important for you to follow up on all test results. SEEK MEDICAL CARE IF:   There is back pain.   Your baby is older than 3 months with a rectal temperature of 100.5 F (38.1 C) or higher for more than 1 day.   Your or your child's problems (symptoms) are no better in 3 days. Return sooner if you or your child is getting worse.  SEEK IMMEDIATE MEDICAL CARE IF:   There is severe back pain or  lower abdominal pain.   You or your child develops chills.   You have a fever.   Your baby is older than 3 months with a rectal temperature of 102 F (38.9 C) or higher.   Your baby is 19 months old or younger with a rectal temperature of 100.4 F (38 C) or higher.   There is nausea or vomiting.   There is continued burning or discomfort with urination.  MAKE SURE YOU:   Understand these instructions.   Will watch your condition.   Will get help right away if you are not doing well or get worse.  Document Released: 07/12/2005 Document Revised: 09/21/2011 Document Reviewed: 02/14/2007 [facility] Patient Information 2012 [place].  RESOURCE GUIDE  Dental Problems  Patients with Medicaid: [place] Phone:  [phone]                                                  Phone:  [phone]  If unable to pay or uninsured, contact:  [facility] or [facility]. to become qualified for the adult dental clinic.  Chronic Pain Problems Contact [facility]  [phone] Patients need to be referred by their primary care doctor.  Insufficient Money for Medicine Contact [employer]:  call "211" or [place] [phone].  No Primary Care Doctor Call [facility]  [phone] Other agencies that provide inexpensive medical care    [place]  [phone]    [facility]  [place]  [phone]    [facility]  [phone]    [facility]  [place]  [phone] [place]   [phone] (emergency services [phone])  Substance Abuse [facility]  [phone] [facility]  [phone] [facility] [place] [phone] [place] [phone] Residential & Outpatient Substance Abuse Program  [phone]  Abuse/Neglect [place] [phone] [place] [phone] (After Hours)  Emergency [place] [phone]  [place] [facility] [place] [phone] [place] [phone]  MRSA Hotline #:   251-859-5805    [facility] [place]  [place] Dept. [address]      [place] Phone:  [phone]                                   Phone:  [phone]                 Phone:  [place] Phone:  [place] [phone] (After Hours)

## 2011-12-21 NOTE — ED Provider Notes (Signed)
History     CSN: [id number]  Arrival date & time 12/19/11  0321   First MD Initiated Contact with Patient 12/19/11 [phone]      Chief Complaint  Patient presents with  . Emesis    (Consider location/radiation/quality/duration/timing/severity/associated sxs/prior treatment) Patient is a 31 y.o. male presenting with diarrhea. The history is provided by the patient (pt complains of diarhea). No language interpreter was used.  Diarrhea The primary symptoms include diarrhea. Primary symptoms do not include fatigue, abdominal pain, nausea, vomiting or rash. The illness began today. The onset was sudden. The problem has not changed since onset. The illness does not include chills, anorexia or back pain. Associated medical issues do not include inflammatory bowel disease or GERD. Risk factors: none.    Past Medical History  Diagnosis Date  . Ulcerative colitis   . Schizophrenia     Past Surgical History  Procedure Date  . Mandible fracture surgery   . Flexible sigmoidoscopy 12/05/2011    Procedure: FLEXIBLE SIGMOIDOSCOPY;  Surgeon: Beryle Beams, MD;  Location: [facility] ENDOSCOPY;  Service: Endoscopy;  Laterality: N/A;    No family history on file.  History  Substance Use Topics  . Smoking status: Current Everyday Smoker  . Smokeless tobacco: Not on file  . Alcohol Use: No      Review of Systems  Constitutional: Negative for chills and fatigue.  HENT: Negative for congestion, sinus pressure and ear discharge.   Eyes: Negative for discharge.  Respiratory: Negative for cough.   Cardiovascular: Negative for chest pain.  Gastrointestinal: Positive for diarrhea. Negative for nausea, vomiting, abdominal pain and anorexia.  Genitourinary: Negative for frequency and hematuria.  Musculoskeletal: Negative for back pain.  Skin: Negative for rash.  Neurological: Negative for seizures and headaches.  Hematological: Negative.   Psychiatric/Behavioral: Negative for hallucinations.     Allergies  Review of patient's allergies indicates no known allergies.  Home Medications   Current Outpatient Rx  Name Route Sig Dispense Refill  . MESALAMINE 400 MG PO TBEC Oral Take 1,600 mg by mouth 3 (three) times daily.    . OXYCODONE HCL 5 MG PO TABS Oral Take 5 mg by mouth every 4 (four) hours as needed. For pain.    Marland Kitchen PANTOPRAZOLE SODIUM 40 MG PO TBEC Oral Take 40 mg by mouth at bedtime.    Marland Kitchen PREDNISONE (PAK) 10 MG PO TABS Oral Take 40 mg by mouth daily.    Marland Kitchen PROMETHAZINE HCL 12.5 MG PO TABS Oral Take 12.5 mg by mouth every 6 (six) hours as needed. For nausea    . CEPHALEXIN 500 MG PO CAPS Oral Take 1 capsule (500 mg total) by mouth 4 (four) times daily. 20 capsule 0    BP 120/70  Pulse 92  Temp(Src) 98 F (36.7 C) (Oral)  Resp 16  SpO2 97%  Physical Exam  Constitutional: He is oriented to person, place, and time. He appears well-developed.  HENT:  Head: Normocephalic and atraumatic.  Eyes: Conjunctivae and EOM are normal. No scleral icterus.  Neck: Neck supple. No thyromegaly present.  Cardiovascular: Normal rate and regular rhythm.  Exam reveals no gallop and no friction rub.   No murmur heard. Pulmonary/Chest: No stridor. He has no wheezes. He has no rales. He exhibits no tenderness.  Abdominal: He exhibits no distension. There is tenderness. There is no rebound.  Musculoskeletal: Normal range of motion. He exhibits no edema.  Lymphadenopathy:    He has no cervical adenopathy.  Neurological: He is oriented  to person, place, and time. Coordination normal.  Skin: No rash noted. No erythema.  Psychiatric: He has a normal mood and affect. His behavior is normal.    ED Course  Procedures (including critical care time)  Labs Reviewed  CBC - Abnormal; Notable for the following:    WBC 21.4 (*)    RBC 3.86 (*)    Hemoglobin 10.8 (*)    HCT 32.5 (*)    Platelets 454 (*)    All other components within normal limits  BASIC METABOLIC PANEL - Abnormal; Notable  for the following:    Potassium 3.4 (*) DELTA CHECK NOTED   Glucose, Bld 100 (*)    All other components within normal limits  URINALYSIS, ROUTINE W REFLEX MICROSCOPIC - Abnormal; Notable for the following:    Color, Urine ORANGE (*) BIOCHEMICALS MAY BE AFFECTED BY COLOR   APPearance CLOUDY (*)    Specific Gravity, Urine >1.030 (*)    Bilirubin Urine SMALL (*)    Ketones, ur 15 (*)    Protein, ur 100 (*)    Nitrite POSITIVE (*)    Leukocytes, UA SMALL (*)    All other components within normal limits  C-REACTIVE PROTEIN - Abnormal; Notable for the following:    CRP 4.14 (*)    All other components within normal limits  URINE MICROSCOPIC-ADD ON - Abnormal; Notable for the following:    Bacteria, UA FEW (*)    Crystals CA OXALATE CRYSTALS (*)    All other components within normal limits  LAB REPORT - SCANNED   No results found.   1. Diarrhea   2. UTI (lower urinary tract infection)       MDM          Maudry Diego, MD 12/21/11 [phone]

## 2012-01-22 NOTE — ED Provider Notes (Signed)
History     CSN: [id number]  Arrival date & time 12/01/11  1431   First MD Initiated Contact with Patient 12/01/11 1647      Chief Complaint  Patient presents with  . Emesis    (Consider location/radiation/quality/duration/timing/severity/associated sxs/prior treatment) Patient is a 31 y.o. male presenting with abdominal pain. The history is provided by the patient (pt with abdomen pain).  Abdominal Pain The primary symptoms of the illness include abdominal pain. The primary symptoms of the illness do not include fever, fatigue or diarrhea. The current episode started 2 days ago. The onset of the illness was gradual. The problem has not changed since onset. The illness is associated with eating. The patient states that she believes she is currently not pregnant. The patient has had a change in bowel habit. Symptoms associated with the illness do not include heartburn, hematuria, frequency or back pain. Significant associated medical issues do not include sickle cell disease.    Past Medical History  Diagnosis Date  . Ulcerative colitis   . Schizophrenia     Past Surgical History  Procedure Date  . Mandible fracture surgery   . Flexible sigmoidoscopy 12/05/2011    Procedure: FLEXIBLE SIGMOIDOSCOPY;  Surgeon: Beryle Beams, MD;  Location: [facility] ENDOSCOPY;  Service: Endoscopy;  Laterality: N/A;    History reviewed. No pertinent family history.  History  Substance Use Topics  . Smoking status: Current Everyday Smoker  . Smokeless tobacco: Not on file  . Alcohol Use: No      Review of Systems  Constitutional: Negative for fever and fatigue.  HENT: Negative for congestion, sinus pressure and ear discharge.   Eyes: Negative for discharge.  Respiratory: Negative for cough.   Cardiovascular: Negative for chest pain.  Gastrointestinal: Positive for abdominal pain. Negative for heartburn and diarrhea.  Genitourinary: Negative for frequency and hematuria.  Musculoskeletal:  Negative for back pain.  Skin: Negative for rash.  Neurological: Negative for seizures and headaches.  Hematological: Negative.   Psychiatric/Behavioral: Negative for hallucinations.    Allergies  Review of patient's allergies indicates no known allergies.  Home Medications   Current Outpatient Rx  Name Route Sig Dispense Refill  . MESALAMINE 400 MG PO TBEC Oral Take 1,600 mg by mouth 3 (three) times daily.    . OXYCODONE HCL 5 MG PO TABS Oral Take 5 mg by mouth every 4 (four) hours as needed. For pain.    Marland Kitchen PANTOPRAZOLE SODIUM 40 MG PO TBEC Oral Take 40 mg by mouth at bedtime.    Marland Kitchen PREDNISONE (PAK) 10 MG PO TABS Oral Take 40 mg by mouth daily.    Marland Kitchen PROMETHAZINE HCL 12.5 MG PO TABS Oral Take 12.5 mg by mouth every 6 (six) hours as needed. For nausea      BP 111/45  Pulse 105  Temp(Src) 97.3 F (36.3 C) (Axillary)  Resp 18  Ht 6' (1.829 m)  Wt 178 lb 12.7 oz (81.1 kg)  BMI 24.25 kg/m2  SpO2 92%  Physical Exam  Constitutional: He is oriented to person, place, and time. He appears well-developed.  HENT:  Head: Normocephalic and atraumatic.  Eyes: Conjunctivae and EOM are normal. No scleral icterus.  Neck: Neck supple. No thyromegaly present.  Cardiovascular: Normal rate and regular rhythm.  Exam reveals no gallop and no friction rub.   No murmur heard. Pulmonary/Chest: No stridor. He has no wheezes. He has no rales. He exhibits no tenderness.  Abdominal: He exhibits no distension. There is tenderness. There is  no rebound.  Musculoskeletal: Normal range of motion. He exhibits no edema.  Lymphadenopathy:    He has no cervical adenopathy.  Neurological: He is oriented to person, place, and time. Coordination normal.  Skin: No rash noted. No erythema.  Psychiatric: He has a normal mood and affect. His behavior is normal.    ED Course  Procedures (including critical care time)  Labs Reviewed  CBC - Abnormal; Notable for the following:    WBC 14.4 (*)    All other  components within normal limits  DIFFERENTIAL - Abnormal; Notable for the following:    Neutro Abs 10.3 (*)    Lymphocytes Relative 9 (*)    Monocytes Relative 13 (*)    Monocytes Absolute 1.8 (*)    Eosinophils Relative 7 (*)    Eosinophils Absolute 1.0 (*)    All other components within normal limits  COMPREHENSIVE METABOLIC PANEL - Abnormal; Notable for the following:    Potassium 3.4 (*)    Glucose, Bld 107 (*)    All other components within normal limits  BASIC METABOLIC PANEL - Abnormal; Notable for the following:    Potassium 3.4 (*)    Glucose, Bld 106 (*)    All other components within normal limits  CBC - Abnormal; Notable for the following:    Hemoglobin 11.8 (*)    HCT 36.5 (*)    RDW 15.7 (*)    All other components within normal limits  COMPREHENSIVE METABOLIC PANEL - Abnormal; Notable for the following:    Potassium 3.3 (*)    Albumin 2.9 (*)    All other components within normal limits  CBC - Abnormal; Notable for the following:    RBC 4.14 (*)    Hemoglobin 11.4 (*)    HCT 35.5 (*)    All other components within normal limits  CBC - Abnormal; Notable for the following:    WBC 13.1 (*)    All other components within normal limits  BASIC METABOLIC PANEL - Abnormal; Notable for the following:    Potassium 3.4 (*)    BUN 5 (*)    All other components within normal limits  CBC - Abnormal; Notable for the following:    WBC 15.7 (*)    All other components within normal limits  BASIC METABOLIC PANEL - Abnormal; Notable for the following:    Glucose, Bld 103 (*)    BUN 3 (*)    GFR calc non Af Amer 88 (*)    All other components within normal limits  CBC - Abnormal; Notable for the following:    WBC 11.6 (*)    All other components within normal limits  BASIC METABOLIC PANEL - Abnormal; Notable for the following:    BUN 4 (*)    All other components within normal limits  CBC - Abnormal; Notable for the following:    WBC 24.9 (*)    RDW 16.0 (*)    All  other components within normal limits  CBC - Abnormal; Notable for the following:    WBC 25.3 (*)    HCT 38.6 (*)    RDW 15.7 (*)    All other components within normal limits  BASIC METABOLIC PANEL - Abnormal; Notable for the following:    Glucose, Bld 175 (*)    All other components within normal limits  CBC - Abnormal; Notable for the following:    WBC 20.3 (*)    RDW 15.8 (*)    Platelets 432 (*)  All other components within normal limits  BASIC METABOLIC PANEL - Abnormal; Notable for the following:    Glucose, Bld 128 (*)    All other components within normal limits  CBC - Abnormal; Notable for the following:    WBC 22.1 (*)    Platelets 457 (*)    All other components within normal limits  BASIC METABOLIC PANEL - Abnormal; Notable for the following:    Chloride 95 (*)    Glucose, Bld 183 (*)    All other components within normal limits  CBC - Abnormal; Notable for the following:    WBC 23.1 (*)    Platelets 484 (*)    All other components within normal limits  BASIC METABOLIC PANEL - Abnormal; Notable for the following:    Glucose, Bld 174 (*)    All other components within normal limits  CBC - Abnormal; Notable for the following:    WBC 17.7 (*)    Platelets 508 (*)    All other components within normal limits  BASIC METABOLIC PANEL - Abnormal; Notable for the following:    Chloride 93 (*)    Glucose, Bld 185 (*)    All other components within normal limits  HEMOGLOBIN A1C - Abnormal; Notable for the following:    Hemoglobin A1C 5.9 (*)    Mean Plasma Glucose 123 (*)    All other components within normal limits  URINALYSIS, ROUTINE W REFLEX MICROSCOPIC - Abnormal; Notable for the following:    Protein, ur 30 (*)    All other components within normal limits  CBC - Abnormal; Notable for the following:    WBC 17.5 (*)    Hemoglobin 12.9 (*)    HCT 37.9 (*)    Platelets 494 (*)    All other components within normal limits  BASIC METABOLIC PANEL - Abnormal;  Notable for the following:    Chloride 93 (*)    Glucose, Bld 150 (*)    All other components within normal limits  CBC - Abnormal; Notable for the following:    WBC 23.7 (*)    Hemoglobin 12.4 (*)    HCT 36.6 (*)    Platelets 495 (*)    All other components within normal limits  BASIC METABOLIC PANEL - Abnormal; Notable for the following:    Chloride 94 (*)    Glucose, Bld 125 (*)    All other components within normal limits  CBC - Abnormal; Notable for the following:    WBC 21.2 (*)    RBC 4.02 (*)    Hemoglobin 11.1 (*)    HCT 33.5 (*)    Platelets 461 (*)    All other components within normal limits  BASIC METABOLIC PANEL - Abnormal; Notable for the following:    Sodium 134 (*)    Glucose, Bld 136 (*)    All other components within normal limits  CBC - Abnormal; Notable for the following:    WBC 21.5 (*)    RBC 3.86 (*)    Hemoglobin 10.9 (*)    HCT 32.5 (*)    Platelets 444 (*)    All other components within normal limits  BASIC METABOLIC PANEL - Abnormal; Notable for the following:    Glucose, Bld 137 (*)    All other components within normal limits  LIPASE, BLOOD  CLOSTRIDIUM DIFFICILE BY PCR  MAGNESIUM  MAGNESIUM  BASIC METABOLIC PANEL  CULTURE, BLOOD (ROUTINE X 2)  CULTURE, BLOOD (ROUTINE X 2)  CLOSTRIDIUM DIFFICILE  BY PCR  URINE CULTURE  EHEC TOXIN BY EIA, STOOL  HEPATITIS B SURFACE ANTIBODY  HEPATITIS B SURFACE ANTIGEN  HEPATITIS C ANTIBODY  URINE MICROSCOPIC-ADD ON  LAB REPORT - SCANNED   No results found.   1. Colitis   2. Leukocytosis   3. Diarrhea   4. Nausea and vomiting   5. Ulcerative colitis   6. Abdominal pain, generalized   7. ULCERATIVE COLITIS       MDM          Maudry Diego, MD 01/22/12 [phone]

## 2012-04-09 DIAGNOSIS — F17200 Nicotine dependence, unspecified, uncomplicated: Secondary | ICD-10-CM | POA: Insufficient documentation

## 2012-04-09 DIAGNOSIS — S298XXA Other specified injuries of thorax, initial encounter: Secondary | ICD-10-CM | POA: Insufficient documentation

## 2012-04-09 DIAGNOSIS — K5190 Ulcerative colitis, unspecified, without complications: Secondary | ICD-10-CM | POA: Insufficient documentation

## 2012-04-09 DIAGNOSIS — F209 Schizophrenia, unspecified: Secondary | ICD-10-CM | POA: Insufficient documentation

## 2012-04-10 ENCOUNTER — Encounter (HOSPITAL_COMMUNITY): Payer: Self-pay | Admitting: Adult Health

## 2012-04-10 ENCOUNTER — Emergency Department (HOSPITAL_COMMUNITY)
Admission: EM | Admit: 2012-04-10 | Discharge: 2012-04-10 | Disposition: A | Discharge status: Home / Self Care | Payer: Medicare Other | Attending: Emergency Medicine | Admitting: Emergency Medicine

## 2012-04-10 ENCOUNTER — Emergency Department (HOSPITAL_COMMUNITY): Payer: Medicare Other

## 2012-04-10 DIAGNOSIS — S20219A Contusion of unspecified front wall of thorax, initial encounter: Secondary | ICD-10-CM

## 2012-04-10 MED ORDER — IBUPROFEN 800 MG PO TABS: 800.0000 mg | ORAL_TABLET | Freq: Once | ORAL | Status: DC

## 2012-04-10 MED ORDER — IBUPROFEN 800 MG PO TABS: 800.0000 mg | ORAL_TABLET | Freq: Three times a day (TID) | ORAL | Status: AC

## 2012-04-10 NOTE — ED Notes (Addendum)
CN in to speak with pt per pt request: pt wants a new MD, wants more xrays, wants a new RN, wants Sprite & water, wants to speak with the xray tech, does not want to take c-collar off, does not want ibuprofen. c-collar removed and pt given sprite and water. EDP aware. Plan & process explained with rationale. Pt unhappy. Pt alert, NAD, calm, interactive, skin W&D, resps e/u, speaking in clear complete sentences (mumbles), pt standing at [facility] re-making his bed, assisted pt with covering up with blankets. Steady gait in room.

## 2012-04-10 NOTE — ED Notes (Signed)
Pt request to speak with charge nurse, notifed CN of the same

## 2012-04-10 NOTE — ED Notes (Signed)
Patient transported to X-ray 
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
## 2012-04-10 NOTE — ED Notes (Signed)
Pt c/o pain all over, right rib, back, left and right hip, neck, both legs.

## 2012-05-12 ENCOUNTER — Emergency Department (HOSPITAL_COMMUNITY)
Admission: EM | Admit: 2012-05-12 | Discharge: 2012-05-13 | Disposition: A | Discharge status: Home / Self Care | Payer: Medicare Other | Attending: Emergency Medicine | Admitting: Emergency Medicine

## 2012-05-12 ENCOUNTER — Encounter (HOSPITAL_COMMUNITY): Payer: Self-pay | Admitting: *Deleted

## 2012-05-12 DIAGNOSIS — IMO0002 Reserved for concepts with insufficient information to code with codable children: Secondary | ICD-10-CM | POA: Insufficient documentation

## 2012-05-12 DIAGNOSIS — F209 Schizophrenia, unspecified: Secondary | ICD-10-CM | POA: Insufficient documentation

## 2012-05-12 LAB — CBC WITH DIFFERENTIAL/PLATELET
Eosinophils Absolute: 1.7 10*3/uL — ABNORMAL HIGH (ref 0.0–0.7)
Eosinophils Relative: 16 % — ABNORMAL HIGH (ref 0–5)
HCT: 38.1 % — ABNORMAL LOW (ref 39.0–52.0)
Hemoglobin: 11.9 g/dL — ABNORMAL LOW (ref 13.0–17.0)
Lymphs Abs: 1.6 10*3/uL (ref 0.7–4.0)
MCH: 21.9 pg — ABNORMAL LOW (ref 26.0–34.0)
MCV: 70 fL — ABNORMAL LOW (ref 78.0–100.0)
Monocytes Relative: 7 % (ref 3–12)
RBC: 5.44 MIL/uL (ref 4.22–5.81)

## 2012-05-12 LAB — COMPREHENSIVE METABOLIC PANEL
Alkaline Phosphatase: 99 U/L (ref 39–117)
BUN: 15 mg/dL (ref 6–23)
CO2: 24 mEq/L (ref 19–32)
Calcium: 9.1 mg/dL (ref 8.4–10.5)
GFR calc Af Amer: >90 mL/min (ref >90)
GFR calc non Af Amer: >90 mL/min (ref >90)
Glucose, Bld: 108 mg/dL — ABNORMAL HIGH (ref 70–99)
Total Protein: 8 g/dL (ref 6.0–8.3)

## 2012-05-12 LAB — RAPID URINE DRUG SCREEN, HOSP PERFORMED
Amphetamines: NOT DETECTED
Opiates: NOT DETECTED

## 2012-05-12 MED ORDER — IBUPROFEN 600 MG PO TABS: 600.0000 mg | ORAL_TABLET | Freq: Three times a day (TID) | ORAL | Status: DC | PRN

## 2012-05-12 MED ORDER — LORAZEPAM 1 MG PO TABS
1.0000 mg | ORAL_TABLET | Freq: Three times a day (TID) | ORAL | Status: DC | PRN
  Administered 2012-05-12 – 2012-05-13 (×2): 1 mg via ORAL
  Filled 2012-05-12 (×2): qty 1

## 2012-05-12 MED ORDER — ONDANSETRON HCL 4 MG PO TABS: 4.0000 mg | ORAL_TABLET | Freq: Three times a day (TID) | ORAL | Status: DC | PRN

## 2012-05-12 MED ORDER — ALUM & MAG HYDROXIDE-SIMETH 200-200-20 MG/5ML PO SUSP: 30.0000 mL | ORAL | Status: DC | PRN

## 2012-05-12 MED ORDER — NICOTINE 21 MG/24HR TD PT24
21.0000 mg | MEDICATED_PATCH | Freq: Every day | TRANSDERMAL | Status: DC
  Filled 2012-05-12: qty 1
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
## 2012-05-12 NOTE — ED Provider Notes (Signed)
History     CSN: [id number]  Arrival date & time 05/12/12  1601   First MD Initiated Contact with Patient 05/12/12 1755      Chief Complaint  Patient presents with  . V70.1    (Consider location/radiation/quality/duration/timing/severity/associated sxs/prior treatment) The history is provided by the patient.   patient here under IVC by police to 2 increased agitation at home. Patient allegedly/his mother is tired. He has a history of schizophrenia has not been compliant with his medications. Denies suicidal or homicidal ideations. No recent history of drug use according to him. Alcohol use recently. Denies any auditory or visual hallucinations. No recent fever or chills. Past Medical History  Diagnosis Date  . Ulcerative colitis   . Schizophrenia     Past Surgical History  Procedure Date  . Mandible fracture surgery   . Flexible sigmoidoscopy 12/05/2011    Procedure: FLEXIBLE SIGMOIDOSCOPY;  Surgeon: Beryle Beams, MD;  Location: [facility] ENDOSCOPY;  Service: Endoscopy;  Laterality: N/A;    No family history on file.  History  Substance Use Topics  . Smoking status: Current Everyday Smoker  . Smokeless tobacco: Not on file  . Alcohol Use: No      Review of Systems  All other systems reviewed and are negative.    Allergies  Review of patient's allergies indicates no known allergies.  Home Medications   Current Outpatient Rx  Name Route Sig Dispense Refill  . RISPERIDONE 1 MG PO TABS Oral Take 1 mg by mouth 2 (two) times daily.      Wt 172 lb 6.4 oz (78.2 kg)  Physical Exam  Nursing note and vitals reviewed. Constitutional: He is oriented to person, place, and time. He appears well-developed and well-nourished.  Non-toxic appearance. No distress.  HENT:  Head: Normocephalic and atraumatic.  Eyes: Conjunctivae, EOM and lids are normal. Pupils are equal, round, and reactive to light.  Neck: Normal range of motion. Neck supple. No tracheal deviation present. No  mass present.  Cardiovascular: Normal rate, regular rhythm and normal heart sounds.  Exam reveals no gallop.   No murmur heard. Pulmonary/Chest: Effort normal and breath sounds normal. No stridor. No respiratory distress. He has no decreased breath sounds. He has no wheezes. He has no rhonchi. He has no rales.  Abdominal: Soft. Normal appearance and bowel sounds are normal. He exhibits no distension. There is no tenderness. There is no rebound and no CVA tenderness.  Musculoskeletal: Normal range of motion. He exhibits no edema and no tenderness.  Neurological: He is alert and oriented to person, place, and time. He has normal strength. No cranial nerve deficit or sensory deficit. GCS eye subscore is 4. GCS verbal subscore is 5. GCS motor subscore is 6.  Skin: Skin is warm and dry. No abrasion and no rash noted.  Psychiatric: He has a normal mood and affect. His speech is normal. He is agitated. Thought content is not paranoid. He expresses no homicidal and no suicidal ideation.    ED Course  Procedures (including critical care time)  Labs Reviewed  CBC WITH DIFFERENTIAL - Abnormal; Notable for the following:    Hemoglobin 11.9 (*)     HCT 38.1 (*)     MCV 70.0 (*)     MCH 21.9 (*)     RDW 20.6 (*)     Platelets 469 (*)     Eosinophils Relative 16 (*)     Eosinophils Absolute 1.7 (*)     All other components within  normal limits  COMPREHENSIVE METABOLIC PANEL - Abnormal; Notable for the following:    Glucose, Bld 108 (*)     Total Bilirubin 0.2 (*)     All other components within normal limits  URINE RAPID DRUG SCREEN (HOSP PERFORMED) - Abnormal; Notable for the following:    Tetrahydrocannabinol POSITIVE (*)     All other components within normal limits  ETHANOL   No results found.   No diagnosis found.    MDM  Patient to be seen by act team and has been medically cleared        Leota Jacobsen, MD 05/12/12 1806

## 2012-05-13 ENCOUNTER — Inpatient Hospital Stay (HOSPITAL_COMMUNITY): Admission: AD | Admit: 2012-05-13 | Payer: Medicare Other | Source: Ambulatory Visit | Admitting: Psychiatry

## 2012-05-13 MED ORDER — ALUM & MAG HYDROXIDE-SIMETH 200-200-20 MG/5ML PO SUSP: 30.0000 mL | ORAL | Status: DC | PRN

## 2012-05-13 MED ORDER — RISPERIDONE 1 MG PO TABS: 1.0000 mg | ORAL_TABLET | Freq: Every day | ORAL | Status: DC

## 2012-05-13 MED ORDER — HYDROXYZINE HCL 25 MG PO TABS: 50.0000 mg | ORAL_TABLET | Freq: Three times a day (TID) | ORAL | Status: DC | PRN

## 2012-05-13 MED ORDER — MAGNESIUM HYDROXIDE 400 MG/5ML PO SUSP: 30.0000 mL | Freq: Every day | ORAL | Status: AC | PRN

## 2012-05-13 MED ORDER — ACETAMINOPHEN 325 MG PO TABS: 650.0000 mg | ORAL_TABLET | Freq: Four times a day (QID) | ORAL | Status: DC | PRN

## 2012-05-13 NOTE — BHH Counselor (Signed)
Patient has been accepted to [facility] by Dr. Scherrie Merritts to the services of Dr. Dell Ponto pending bed availability.

## 2012-05-13 NOTE — BHH Counselor (Signed)
First Opinion completed and placed in patient's chart.

## 2012-05-13 NOTE — ED Notes (Signed)
Request for Telepsych faxed.

## 2012-05-13 NOTE — ED Provider Notes (Addendum)
Pt denies si or hi at present . Pt alert coopertive gcs 15 telepsych consult ordered  Orlie Dakin, MD 05/13/12 0951  [place] Rx riasperdal 1 mg qhspt  Ft of/u with Benjaman Kindler, MD 05/13/12 1157

## 2012-06-01 ENCOUNTER — Emergency Department (HOSPITAL_COMMUNITY): Payer: Medicare Other

## 2012-06-01 ENCOUNTER — Emergency Department (HOSPITAL_COMMUNITY)
Admission: EM | Admit: 2012-06-01 | Discharge: 2012-06-01 | Disposition: A | Discharge status: Home / Self Care | Payer: Medicare Other | Attending: Emergency Medicine | Admitting: Emergency Medicine

## 2012-06-01 ENCOUNTER — Encounter (HOSPITAL_COMMUNITY): Payer: Self-pay

## 2012-06-01 DIAGNOSIS — F17200 Nicotine dependence, unspecified, uncomplicated: Secondary | ICD-10-CM | POA: Insufficient documentation

## 2012-06-01 DIAGNOSIS — M542 Cervicalgia: Secondary | ICD-10-CM | POA: Insufficient documentation

## 2012-06-01 DIAGNOSIS — S62609A Fracture of unspecified phalanx of unspecified finger, initial encounter for closed fracture: Secondary | ICD-10-CM

## 2012-06-01 DIAGNOSIS — IMO0002 Reserved for concepts with insufficient information to code with codable children: Secondary | ICD-10-CM | POA: Insufficient documentation

## 2012-06-01 DIAGNOSIS — T07XXXA Unspecified multiple injuries, initial encounter: Secondary | ICD-10-CM | POA: Insufficient documentation

## 2012-06-01 DIAGNOSIS — M79609 Pain in unspecified limb: Secondary | ICD-10-CM | POA: Insufficient documentation

## 2012-06-01 DIAGNOSIS — F209 Schizophrenia, unspecified: Secondary | ICD-10-CM | POA: Insufficient documentation

## 2012-06-01 LAB — BASIC METABOLIC PANEL
BUN: 21 mg/dL (ref 6–23)
Calcium: 9.6 mg/dL (ref 8.4–10.5)
GFR calc non Af Amer: 85 mL/min — ABNORMAL LOW (ref >90)
Glucose, Bld: 80 mg/dL (ref 70–99)
Sodium: 140 mEq/L (ref 135–145)

## 2012-06-01 LAB — CBC
HCT: 39.7 % (ref 39.0–52.0)
Hemoglobin: 12.3 g/dL — ABNORMAL LOW (ref 13.0–17.0)
MCH: 22.4 pg — ABNORMAL LOW (ref 26.0–34.0)
MCHC: 31 g/dL (ref 30.0–36.0)

## 2012-06-01 MED ORDER — ZOLPIDEM TARTRATE 5 MG PO TABS
5.0000 mg | ORAL_TABLET | Freq: Every evening | ORAL | Status: DC | PRN
  Filled 2012-06-01: qty 1

## 2012-06-01 MED ORDER — ACETAMINOPHEN 325 MG PO TABS: 650.0000 mg | ORAL_TABLET | ORAL | Status: DC | PRN

## 2012-06-01 MED ORDER — LORAZEPAM 1 MG PO TABS: 1.0000 mg | ORAL_TABLET | Freq: Three times a day (TID) | ORAL | Status: DC | PRN

## 2012-06-01 MED ORDER — RISPERIDONE 0.5 MG PO TABS
1.0000 mg | ORAL_TABLET | Freq: Three times a day (TID) | ORAL | Status: DC
  Administered 2012-06-01: 1 mg via ORAL
  Administered 2012-06-01: 0.5 mg via ORAL
  Filled 2012-06-01: qty 2
  Filled 2012-06-01: qty 1

## 2012-06-01 MED ORDER — ONDANSETRON HCL 8 MG PO TABS: 4.0000 mg | ORAL_TABLET | Freq: Three times a day (TID) | ORAL | Status: DC | PRN

## 2012-06-01 MED ORDER — NICOTINE 21 MG/24HR TD PT24
21.0000 mg | MEDICATED_PATCH | Freq: Every day | TRANSDERMAL | Status: DC
  Filled 2012-06-01: qty 1

## 2012-06-01 MED ORDER — IBUPROFEN 200 MG PO TABS
600.0000 mg | ORAL_TABLET | Freq: Three times a day (TID) | ORAL | Status: DC | PRN
  Administered 2012-06-01: 600 mg via ORAL
  Filled 2012-06-01: qty 1

## 2012-06-01 MED ORDER — ZIPRASIDONE MESYLATE 20 MG IM SOLR
10.0000 mg | Freq: Once | INTRAMUSCULAR | Status: AC
  Administered 2012-06-01: 10 mg via INTRAMUSCULAR
  Filled 2012-06-01: qty 20
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
## 2012-06-01 NOTE — ED Notes (Signed)
Report to Chris RN.

## 2012-06-01 NOTE — ED Notes (Signed)
Pt states understanding of discharge instructions 

## 2012-06-01 NOTE — ED Notes (Signed)
Pt showered.

## 2012-06-01 NOTE — ED Notes (Signed)
Report to [facility] on POD C.

## 2012-06-01 NOTE — ED Notes (Signed)
Patient moved to [address] with [facility] at the bedside.

## 2012-06-01 NOTE — ED Notes (Signed)
[place] sandwich and snack items provided
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
## 2012-06-01 NOTE — Progress Notes (Signed)
Orthopedic Tech Progress Note Patient Details:  [name] 1981-06-27 [id number]  Ortho Devices Type of Ortho Device: Finger splint;Buddy tape Ortho Device/Splint Location: left hand Ortho Device/Splint Interventions: Application   Hildred Priest 06/01/2012, 2:48 PM
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
## 2012-06-01 NOTE — BHH Counselor (Signed)
Per Telepsych has recommended Pt be discharged to follow up with provider which EDP is OK with disposition. Nurse has been notified.
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
## 2012-06-01 NOTE — Progress Notes (Signed)
11:34 PM Telepsych consult felt pt could be released.  He should avoid alcohol and drugs, and take his regular medicines.  The psychiatrist released pt from his involuntary commitment.

## 2012-06-01 NOTE — ED Notes (Signed)
Patient refused Nicotine patch as I was getting ready to place it on his arm. Dinner tray given to patient.
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
## 2012-06-03 NOTE — ED Provider Notes (Signed)
Medical screening examination/treatment/procedure(s) were performed by non-physician practitioner and as supervising physician I was immediately available for consultation/collaboration.   Malvin Johns, MD 06/03/12 [phone]

## 2012-06-17 ENCOUNTER — Emergency Department (HOSPITAL_COMMUNITY)
Admission: EM | Admit: 2012-06-17 | Discharge: 2012-06-18 | Disposition: A | Discharge status: Other Acute Inpatient Hospital | Payer: Medicare Other | Attending: Emergency Medicine | Admitting: Emergency Medicine

## 2012-06-17 ENCOUNTER — Encounter (HOSPITAL_COMMUNITY): Payer: Self-pay | Admitting: *Deleted

## 2012-06-17 DIAGNOSIS — F1410 Cocaine abuse, uncomplicated: Secondary | ICD-10-CM | POA: Insufficient documentation

## 2012-06-17 DIAGNOSIS — F209 Schizophrenia, unspecified: Secondary | ICD-10-CM | POA: Insufficient documentation

## 2012-06-17 DIAGNOSIS — F17200 Nicotine dependence, unspecified, uncomplicated: Secondary | ICD-10-CM | POA: Insufficient documentation

## 2012-06-17 DIAGNOSIS — K5190 Ulcerative colitis, unspecified, without complications: Secondary | ICD-10-CM | POA: Insufficient documentation

## 2012-06-17 LAB — ACETAMINOPHEN LEVEL: Acetaminophen (Tylenol), Serum: <15 ug/mL (ref 10–30)

## 2012-06-17 LAB — COMPREHENSIVE METABOLIC PANEL
Albumin: 4.2 g/dL (ref 3.5–5.2)
Alkaline Phosphatase: 119 U/L — ABNORMAL HIGH (ref 39–117)
BUN: 18 mg/dL (ref 6–23)
CO2: 24 mEq/L (ref 19–32)
Chloride: 104 mEq/L (ref 96–112)
GFR calc non Af Amer: >90 mL/min (ref >90)
Glucose, Bld: 144 mg/dL — ABNORMAL HIGH (ref 70–99)
Potassium: 3.1 mEq/L — ABNORMAL LOW (ref 3.5–5.1)
Total Bilirubin: 0.6 mg/dL (ref 0.3–1.2)

## 2012-06-17 LAB — RAPID URINE DRUG SCREEN, HOSP PERFORMED
Amphetamines: NOT DETECTED
Barbiturates: NOT DETECTED
Benzodiazepines: NOT DETECTED
Cocaine: POSITIVE — AB
Opiates: NOT DETECTED
Tetrahydrocannabinol: NOT DETECTED

## 2012-06-17 LAB — CBC
HCT: 36.5 % — ABNORMAL LOW (ref 39.0–52.0)
Hemoglobin: 11.7 g/dL — ABNORMAL LOW (ref 13.0–17.0)
MCH: 23.3 pg — ABNORMAL LOW (ref 26.0–34.0)
MCHC: 32.1 g/dL (ref 30.0–36.0)
MCV: 72.7 fL — ABNORMAL LOW (ref 78.0–100.0)
Platelets: 400 10*3/uL (ref 150–400)
RBC: 5.02 MIL/uL (ref 4.22–5.81)
RDW: 19.8 % — ABNORMAL HIGH (ref 11.5–15.5)
WBC: 11.4 10*3/uL — ABNORMAL HIGH (ref 4.0–10.5)

## 2012-06-17 LAB — ETHANOL: Alcohol, Ethyl (B): <11 mg/dL (ref 0–11)

## 2012-06-17 MED ORDER — ALUM & MAG HYDROXIDE-SIMETH 200-200-20 MG/5ML PO SUSP: 30.0000 mL | ORAL | Status: DC | PRN

## 2012-06-17 MED ORDER — ACETAMINOPHEN 325 MG PO TABS: 650.0000 mg | ORAL_TABLET | ORAL | Status: DC | PRN

## 2012-06-17 MED ORDER — ZOLPIDEM TARTRATE 5 MG PO TABS: 5.0000 mg | ORAL_TABLET | Freq: Every evening | ORAL | Status: DC | PRN

## 2012-06-17 MED ORDER — LORAZEPAM 1 MG PO TABS
1.0000 mg | ORAL_TABLET | Freq: Three times a day (TID) | ORAL | Status: DC | PRN
  Administered 2012-06-17 – 2012-06-18 (×2): 1 mg via ORAL
  Filled 2012-06-17 (×2): qty 1

## 2012-06-17 MED ORDER — IBUPROFEN 600 MG PO TABS: 600.0000 mg | ORAL_TABLET | Freq: Three times a day (TID) | ORAL | Status: DC | PRN

## 2012-06-17 MED ORDER — RISPERIDONE 1 MG PO TABS
1.0000 mg | ORAL_TABLET | Freq: Three times a day (TID) | ORAL | Status: DC
  Administered 2012-06-17: 1 mg via ORAL
  Filled 2012-06-17 (×2): qty 1

## 2012-06-17 MED ORDER — ONDANSETRON HCL 4 MG PO TABS: 4.0000 mg | ORAL_TABLET | Freq: Three times a day (TID) | ORAL | Status: DC | PRN

## 2012-06-17 NOTE — ED Notes (Signed)
Pt's belongings placed in locker # 54.

## 2012-06-17 NOTE — ED Notes (Signed)
One belongings bag, IVC papers and yellow copy with lockbox key to security box #1 taken back to TCU with pt and [employer]. In lockbox pt has 2 cell phones, one cigarette, one black pouch with ID and plastic cards, 7 dimes, 1 nickel, 2 $5 bills, 7 $1 bills.

## 2012-06-17 NOTE — ED Provider Notes (Signed)
History     CSN: [id number]  Arrival date & time 06/17/12  1152   First MD Initiated Contact with Patient 06/17/12 1206      Chief Complaint  Patient presents with  . Medical Clearance    (Consider location/radiation/quality/duration/timing/severity/associated sxs/prior treatment) The history is provided by the patient.  patient was brought in under IVC by his mother for hallucinations and threatening to kill people. He denies this and states that he does not know why she would do that. He was seen last month for the same and sent home after Telepsyche eval. Patient is without complaints.   Past Medical History  Diagnosis Date  . Ulcerative colitis   . Schizophrenia     Past Surgical History  Procedure Date  . Mandible fracture surgery   . Flexible sigmoidoscopy 12/05/2011    Procedure: FLEXIBLE SIGMOIDOSCOPY;  Surgeon: Beryle Beams, MD;  Location: [facility] ENDOSCOPY;  Service: Endoscopy;  Laterality: N/A;    No family history on file.  History  Substance Use Topics  . Smoking status: Current Everyday Smoker -- 0.2 packs/day for 18 years    Types: Cigarettes  . Smokeless tobacco: Not on file  . Alcohol Use: No      Review of Systems  Constitutional: Negative for appetite change.  Respiratory: Negative for shortness of breath.   Cardiovascular: Negative for chest pain.  Gastrointestinal: Negative for diarrhea.  Genitourinary: Negative for flank pain.  Neurological: Negative for headaches.  Psychiatric/Behavioral: Negative for hallucinations.    Allergies  Depakote  Home Medications   Current Outpatient Rx  Name Route Sig Dispense Refill  . OVER THE COUNTER MEDICATION Oral Take 1 tablet by mouth daily. Multivitamin used for blood pressure    . RISPERIDONE 1 MG PO TABS Oral Take 1 mg by mouth 3 (three) times daily.       BP 119/63  Pulse 119  Temp 99 F (37.2 C) (Oral)  SpO2 97%  Physical Exam  Nursing note and vitals reviewed. Constitutional: He is  oriented to person, place, and time. He appears well-developed and well-nourished.  HENT:  Head: Normocephalic and atraumatic.  Eyes: EOM are normal.  Cardiovascular: Regular rhythm and normal heart sounds.   No murmur heard. Pulmonary/Chest: Effort normal and breath sounds normal.  Abdominal: Soft. Bowel sounds are normal. He exhibits no distension and no mass. There is no tenderness. There is no rebound and no guarding.  Musculoskeletal: Normal range of motion. He exhibits no edema.  Neurological: He is alert and oriented to person, place, and time. No cranial nerve deficit.  Skin: Skin is warm and dry.  Psychiatric:       Somewhat flat affect    ED Course  Procedures (including critical care time)  Labs Reviewed  CBC - Abnormal; Notable for the following:    WBC 11.4 (*)     Hemoglobin 11.7 (*)     HCT 36.5 (*)     MCV 72.7 (*)     MCH 23.3 (*)     RDW 19.8 (*)     All other components within normal limits  COMPREHENSIVE METABOLIC PANEL - Abnormal; Notable for the following:    Potassium 3.1 (*)     Glucose, Bld 144 (*)     AST 43 (*)     Alkaline Phosphatase 119 (*)     All other components within normal limits  URINE RAPID DRUG SCREEN (HOSP PERFORMED) - Abnormal; Notable for the following:    Cocaine POSITIVE (*)  All other components within normal limits  ETHANOL  ACETAMINOPHEN LEVEL   No results found.   1. Schizophrenia       MDM  Patient was brought in under IVC. Reportedly had been hallucinating and threatening. He recently had a similar IVC by his mom and was discharged home after a telemetry psychiatry evaluation. Lab work is overall reassuring. He is cocaine positive. He will get seen by the ACT team.        Jasper Riling. Alvino Chapel, MD 06/17/12 1517

## 2012-06-17 NOTE — ED Notes (Signed)
Pt agitated  Ativan given

## 2012-06-17 NOTE — ED Notes (Signed)
Attempted TelePsych consult.

## 2012-06-17 NOTE — ED Notes (Signed)
Pt brought in with [employer] and IVC papers, harm to self and other, hearing voices, pt denies all symptoms,

## 2012-06-17 NOTE — ED Notes (Signed)
Patient has one bag locked in locker 26.
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
## 2012-06-18 MED ORDER — ZIPRASIDONE MESYLATE 20 MG IM SOLR
10.0000 mg | Freq: Once | INTRAMUSCULAR | Status: AC
  Administered 2012-06-18: 10 mg via INTRAMUSCULAR
  Filled 2012-06-18: qty 20

## 2012-06-18 MED ORDER — POTASSIUM CHLORIDE CRYS ER 20 MEQ PO TBCR
40.0000 meq | EXTENDED_RELEASE_TABLET | Freq: Once | ORAL | Status: AC
  Administered 2012-06-18: 40 meq via ORAL
  Filled 2012-06-18: qty 2

## 2012-06-18 MED ORDER — RISPERIDONE 2 MG PO TABS: 4.0000 mg | ORAL_TABLET | Freq: Every day | ORAL | Status: DC

## 2012-06-18 NOTE — ED Provider Notes (Addendum)
Pt pleasant cooperative gcs 15. State "I'm not going to harm no one. I' m not suicidal."  Telepsch  Consult ordered  Orlie Dakin, MD 06/18/12 [phone]  Adendum telelepych was consuted yterday, reccommended inpt stay .tele psychiatry consult cancelled  Orlie Dakin, MD 06/18/12 0802 Patient accepted in transfer to [facility].Dr Dareen Piano accepting MD. 3:25 PM patient alert ambulatory. [place] Coma Score [place], MD 06/18/12 [place], MD 06/18/12 [phone]

## 2012-06-18 NOTE — ED Notes (Signed)
[facility] called for transport.  Said that they didn't know when they would be able to come.  Might be night shift.  Old Vertis Kelch requests a phone call when pt is on his way.

## 2012-06-18 NOTE — ED Notes (Signed)
Pt coming up to desk continually, asking for snacks, asked nurse to go to vending machine, asking for ED doc, asking for psychiatrist.  Pt denies having a telepsych even though we have paperwork that proves otherwise.  Pt given an ativan for this anxiety.

## 2012-06-18 NOTE — ED Notes (Signed)
Report called to Colletta Maryland, Therapist, sports at [facility].

## 2012-08-25 ENCOUNTER — Encounter (HOSPITAL_COMMUNITY): Payer: Self-pay | Admitting: Physical Medicine and Rehabilitation

## 2012-08-25 ENCOUNTER — Emergency Department (HOSPITAL_COMMUNITY)
Admission: EM | Admit: 2012-08-25 | Discharge: 2012-08-25 | Disposition: A | Discharge status: Home / Self Care | Payer: Medicare Other | Attending: Emergency Medicine | Admitting: Emergency Medicine

## 2012-08-25 DIAGNOSIS — Z8659 Personal history of other mental and behavioral disorders: Secondary | ICD-10-CM | POA: Insufficient documentation

## 2012-08-25 DIAGNOSIS — R1013 Epigastric pain: Secondary | ICD-10-CM | POA: Insufficient documentation

## 2012-08-25 DIAGNOSIS — Z23 Encounter for immunization: Secondary | ICD-10-CM | POA: Insufficient documentation

## 2012-08-25 DIAGNOSIS — F17200 Nicotine dependence, unspecified, uncomplicated: Secondary | ICD-10-CM | POA: Insufficient documentation

## 2012-08-25 DIAGNOSIS — R109 Unspecified abdominal pain: Secondary | ICD-10-CM

## 2012-08-25 DIAGNOSIS — Z8719 Personal history of other diseases of the digestive system: Secondary | ICD-10-CM | POA: Insufficient documentation

## 2012-08-25 LAB — COMPREHENSIVE METABOLIC PANEL
ALT: 14 U/L (ref 0–53)
AST: 22 U/L (ref 0–37)
Alkaline Phosphatase: 80 U/L (ref 39–117)
CO2: 28 mEq/L (ref 19–32)
Chloride: 109 mEq/L (ref 96–112)
GFR calc non Af Amer: 76 mL/min — ABNORMAL LOW (ref >90)
Sodium: 145 mEq/L (ref 135–145)
Total Bilirubin: 0.2 mg/dL — ABNORMAL LOW (ref 0.3–1.2)

## 2012-08-25 LAB — CBC WITH DIFFERENTIAL/PLATELET
Basophils Absolute: 0.1 10*3/uL (ref 0.0–0.1)
HCT: 36.9 % — ABNORMAL LOW (ref 39.0–52.0)
Lymphocytes Relative: 18 % (ref 12–46)
Neutro Abs: 5.7 10*3/uL (ref 1.7–7.7)
Platelets: 365 10*3/uL (ref 150–400)
RBC: 4.84 MIL/uL (ref 4.22–5.81)
RDW: 18.7 % — ABNORMAL HIGH (ref 11.5–15.5)
WBC: 8.8 10*3/uL (ref 4.0–10.5)

## 2012-08-25 MED ORDER — IBUPROFEN 800 MG PO TABS
800.0000 mg | ORAL_TABLET | Freq: Once | ORAL | Status: AC
  Administered 2012-08-25: 800 mg via ORAL
  Filled 2012-08-25: qty 1

## 2012-08-25 MED ORDER — TETANUS-DIPHTH-ACELL PERTUSSIS 5-2.5-18.5 LF-MCG/0.5 IM SUSP
0.5000 mL | Freq: Once | INTRAMUSCULAR | Status: AC
  Administered 2012-08-25: 0.5 mL via INTRAMUSCULAR
  Filled 2012-08-25: qty 0.5

## 2012-08-25 NOTE — ED Provider Notes (Signed)
History     CSN: [id number]  Arrival date & time 08/25/12  0840   First MD Initiated Contact with Patient 08/25/12 [phone]      Chief Complaint  Patient presents with  . Abdominal Pain  . Nausea    (Consider location/radiation/quality/duration/timing/severity/associated sxs/prior treatment) HPI Comments: 31 year old male presents to the emergency department complaining of new onset abdominal pain for the past 4 days. A triage summary, patient states the pain is constant, described as "a knot" rated 8/10. He has not tried any alleviating factors. Nothing in specific makes the abdominal pain worse. States the pain is located on his left side of his abdomen radiating over to the right. Denies nausea, vomiting, fever, chills, bowel or bladder changes. He is not eating anything different than normal. He states he has a history of ulcerative colitis, however he does not take medications for this. He is not sure who his primary care physician is. Denies alcohol or drug use. Also states he needs a tetanus shot because he scraped his right arm on an old piece of metal 2 days ago. Unsure of last tetanus shot.  Patient is a 31 y.o. male presenting with abdominal pain. The history is provided by the patient.  Abdominal Pain The primary symptoms of the illness include abdominal pain. The primary symptoms of the illness do not include fever, shortness of breath, nausea or vomiting.  Symptoms associated with the illness do not include chills.    Past Medical History  Diagnosis Date  . Ulcerative colitis   . Schizophrenia     Past Surgical History  Procedure Date  . Mandible fracture surgery   . Flexible sigmoidoscopy 12/05/2011    Procedure: FLEXIBLE SIGMOIDOSCOPY;  Surgeon: Beryle Beams, MD;  Location: [facility] ENDOSCOPY;  Service: Endoscopy;  Laterality: N/A;    History reviewed. No pertinent family history.  History  Substance Use Topics  . Smoking status: Current Every Day Smoker -- 0.2  packs/day for 18 years    Types: Cigarettes  . Smokeless tobacco: Not on file  . Alcohol Use: No      Review of Systems  Constitutional: Negative for fever, chills and appetite change.  HENT: Negative.   Respiratory: Negative for shortness of breath.   Cardiovascular: Negative for chest pain.  Gastrointestinal: Positive for abdominal pain. Negative for nausea and vomiting.  Genitourinary: Negative.   Musculoskeletal: Negative.   Skin: Positive for wound.  Neurological: Negative for weakness.  Psychiatric/Behavioral: Negative.     Allergies  Depakote  Home Medications  No current outpatient prescriptions on file.  BP 138/65  Pulse 79  Temp 98.6 F (37 C) (Oral)  Resp 18  SpO2 100%  Physical Exam  Nursing note and vitals reviewed. Constitutional: He is oriented to person, place, and time. He appears well-developed and well-nourished. No distress.  HENT:  Head: Normocephalic and atraumatic.  Mouth/Throat: Oropharynx is clear and moist.  Eyes: Conjunctivae normal and EOM are normal. Pupils are equal, round, and reactive to light.  Neck: Normal range of motion. Neck supple.  Cardiovascular: Normal rate, regular rhythm and normal heart sounds.   Pulmonary/Chest: Effort normal and breath sounds normal. No respiratory distress.  Abdominal: Soft. Normal appearance and bowel sounds are normal. He exhibits no mass. There is tenderness (generalized, worse in mid-epigastric region). There is guarding (in mid-epigastric region). There is no rigidity (no peritoneal signs) and no rebound.  Musculoskeletal: Normal range of motion. He exhibits no edema.  Neurological: He is alert and oriented to  person, place, and time.  Skin: Skin is warm and dry.     Psychiatric: He has a normal mood and affect. His behavior is normal. His speech is slurred.    ED Course  Procedures (including critical care time)  Labs Reviewed  CBC WITH DIFFERENTIAL - Abnormal; Notable for the following:     Hemoglobin 11.8 (*)     HCT 36.9 (*)     MCV 76.2 (*)     MCH 24.4 (*)     RDW 18.7 (*)     Eosinophils Relative 7 (*)     All other components within normal limits  COMPREHENSIVE METABOLIC PANEL - Abnormal; Notable for the following:    Total Bilirubin 0.2 (*)     GFR calc non Af Amer 76 (*)     GFR calc Af Amer 88 (*)     All other components within normal limits  LIPASE, BLOOD   No results found.   1. Abdominal pain   2. Need for Tdap vaccination       MDM  31 y/o male with abdominal pain without any associated symptoms. Labs unremarkable. Slurred speech but denies alcohol use. When asked if ibuprofen helped his pain in ED, he said "yes, look lady I want a tetanus shot". Abdomen no longer tender to palpation. Will give tetanus shot. Stable for discharge.        Illene Labrador, PA-C 08/25/12 1022

## 2012-08-25 NOTE — ED Notes (Signed)
Pt discharged home. Had no further questions at the time.

## 2012-08-25 NOTE — ED Notes (Signed)
Pt presents to department for evaluation of diffuse abdominal pain and nausea. Ongoing x1 week. Pt states cramping intermittent pain, 8/10 at the time. Denies fever. Denies urinary problems. He is conscious alert and oriented x4. No signs of distress noted.

## 2012-08-25 NOTE — ED Provider Notes (Signed)
Medical screening examination/treatment/procedure(s) were performed by non-physician practitioner and as supervising physician I was immediately available for consultation/collaboration.   Hoy Morn, MD 08/25/12 1026

## 2012-10-21 ENCOUNTER — Encounter (HOSPITAL_COMMUNITY): Payer: Self-pay | Admitting: Emergency Medicine

## 2012-10-21 ENCOUNTER — Emergency Department (HOSPITAL_COMMUNITY)
Admission: EM | Admit: 2012-10-21 | Discharge: 2012-10-24 | Disposition: A | Discharge status: Home / Self Care | Payer: Medicare Other | Attending: Emergency Medicine | Admitting: Emergency Medicine

## 2012-10-21 DIAGNOSIS — F1010 Alcohol abuse, uncomplicated: Secondary | ICD-10-CM | POA: Insufficient documentation

## 2012-10-21 DIAGNOSIS — F17200 Nicotine dependence, unspecified, uncomplicated: Secondary | ICD-10-CM | POA: Insufficient documentation

## 2012-10-21 DIAGNOSIS — F209 Schizophrenia, unspecified: Secondary | ICD-10-CM | POA: Insufficient documentation

## 2012-10-21 DIAGNOSIS — Z79899 Other long term (current) drug therapy: Secondary | ICD-10-CM | POA: Insufficient documentation

## 2012-10-21 DIAGNOSIS — F1410 Cocaine abuse, uncomplicated: Secondary | ICD-10-CM | POA: Insufficient documentation

## 2012-10-21 DIAGNOSIS — F10929 Alcohol use, unspecified with intoxication, unspecified: Secondary | ICD-10-CM

## 2012-10-21 DIAGNOSIS — Z8719 Personal history of other diseases of the digestive system: Secondary | ICD-10-CM | POA: Insufficient documentation

## 2012-10-21 DIAGNOSIS — F29 Unspecified psychosis not due to a substance or known physiological condition: Secondary | ICD-10-CM

## 2012-10-21 LAB — URINALYSIS, ROUTINE W REFLEX MICROSCOPIC
Glucose, UA: NEGATIVE mg/dL
Ketones, ur: NEGATIVE mg/dL
Leukocytes, UA: NEGATIVE
Nitrite: NEGATIVE
Specific Gravity, Urine: 1.008 (ref 1.005–1.030)
pH: 6 (ref 5.0–8.0)

## 2012-10-21 LAB — COMPREHENSIVE METABOLIC PANEL
AST: 28 U/L (ref 0–37)
Albumin: 4.4 g/dL (ref 3.5–5.2)
Alkaline Phosphatase: 94 U/L (ref 39–117)
BUN: 8 mg/dL (ref 6–23)
Chloride: 99 mEq/L (ref 96–112)
Creatinine, Ser: 0.92 mg/dL (ref 0.50–1.35)
Potassium: 3.2 mEq/L — ABNORMAL LOW (ref 3.5–5.1)
Total Bilirubin: 0.4 mg/dL (ref 0.3–1.2)
Total Protein: 8.4 g/dL — ABNORMAL HIGH (ref 6.0–8.3)

## 2012-10-21 LAB — RAPID URINE DRUG SCREEN, HOSP PERFORMED
Amphetamines: NOT DETECTED
Benzodiazepines: NOT DETECTED
Cocaine: POSITIVE — AB

## 2012-10-21 LAB — CBC
MCHC: 32.5 g/dL (ref 30.0–36.0)
Platelets: 309 10*3/uL (ref 150–400)
RDW: 17.8 % — ABNORMAL HIGH (ref 11.5–15.5)
WBC: 12.9 10*3/uL — ABNORMAL HIGH (ref 4.0–10.5)

## 2012-10-21 LAB — ETHANOL: Alcohol, Ethyl (B): 133 mg/dL — ABNORMAL HIGH (ref 0–11)

## 2012-10-21 MED ORDER — POTASSIUM CHLORIDE CRYS ER 20 MEQ PO TBCR
40.0000 meq | EXTENDED_RELEASE_TABLET | Freq: Once | ORAL | Status: AC
  Administered 2012-10-22: 40 meq via ORAL
  Filled 2012-10-21: qty 2

## 2012-10-21 MED ORDER — LORAZEPAM 2 MG/ML IJ SOLN
2.0000 mg | Freq: Once | INTRAMUSCULAR | Status: AC
  Administered 2012-10-21: 2 mg via INTRAMUSCULAR
  Filled 2012-10-21: qty 1

## 2012-10-21 MED ORDER — WHITE PETROLATUM GEL
Status: AC
  Administered 2012-10-21: 1
  Filled 2012-10-21: qty 5

## 2012-10-21 MED ORDER — ZIPRASIDONE MESYLATE 20 MG IM SOLR
10.0000 mg | Freq: Once | INTRAMUSCULAR | Status: AC
  Administered 2012-10-21: 10 mg via INTRAMUSCULAR
  Filled 2012-10-21: qty 20
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
## 2012-10-21 NOTE — ED Notes (Signed)
Two bags in locker 42
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
## 2012-10-22 DIAGNOSIS — F1410 Cocaine abuse, uncomplicated: Secondary | ICD-10-CM

## 2012-10-22 DIAGNOSIS — F200 Paranoid schizophrenia: Secondary | ICD-10-CM

## 2012-10-22 MED ORDER — RISPERIDONE 2 MG PO TABS
2.0000 mg | ORAL_TABLET | Freq: Every day | ORAL | Status: DC
  Administered 2012-10-22: 2 mg via ORAL
  Filled 2012-10-22: qty 1

## 2012-10-22 MED ORDER — DIPHENHYDRAMINE HCL 25 MG PO CAPS: 50.0000 mg | ORAL_CAPSULE | Freq: Four times a day (QID) | ORAL | Status: DC | PRN

## 2012-10-22 NOTE — ED Provider Notes (Signed)
[name] is a 32 y.o. male who is here for aggressive behavior. He's undergone IVC commitment from family members. He has been calm, and cooperative, since getting IM Geodon, and Ativan, last night. He is eating breakfast.   Patient will be assessed by psychiatry today.  Richarda Blade, MD 10/22/12 [phone]
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
## 2012-10-22 NOTE — Clinical Social Work Note (Signed)
CSW received a call from [facility] who stated they did not have a bed available to meet the needs of the pt.  CSW informed ACT.  Nonnie Done, [place] [phone]  Clinical Social Work
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
## 2012-10-22 NOTE — ED Notes (Signed)
Pt up at nursing station

## 2012-10-22 NOTE — ED Notes (Signed)
Unable to give patient medication due to the fact that patient is sound asleep after having Geodon and Ativan injection.

## 2012-10-23 MED ORDER — LORAZEPAM 1 MG PO TABS
1.0000 mg | ORAL_TABLET | Freq: Two times a day (BID) | ORAL | Status: DC
  Administered 2012-10-23 – 2012-10-24 (×3): 1 mg via ORAL
  Filled 2012-10-23 (×3): qty 1

## 2012-10-23 MED ORDER — ZIPRASIDONE HCL 20 MG PO CAPS
20.0000 mg | ORAL_CAPSULE | Freq: Two times a day (BID) | ORAL | Status: DC
  Administered 2012-10-23 – 2012-10-24 (×2): 20 mg via ORAL
  Filled 2012-10-23 (×2): qty 1

## 2012-10-23 NOTE — Treatment Plan (Signed)
[facility] has no appropriate beds today (10/23/12).  Please seek placement elsewhere. Pt will be run at [facility] once an appropriate bed becomes available.

## 2012-10-23 NOTE — BHH Counselor (Signed)
Assessment notification sent to [facility] for acceptance review, waiting disposition.   Massie Bougie, MS, LCAS

## 2012-10-23 NOTE — ED Notes (Signed)
Patient refused vitals.

## 2012-10-23 NOTE — Consult Note (Signed)
Reason for Consult: psychosis, aggression and cocaine intoxication Referring Physician: Dr. Lowella Dell is an 32 y.o. male.  HPI: Patient was taken his medication as prescribed last night. He has refused early morning vitals as per med tech and blocked his door with chair which was removed . His mother who visited [facility] spoke with CSW and concern about safety of patient and family. Patient has requested to change his medication Risperidal to Geodon as he was concern about male breast development and possible elevated Prolactin. Staff RN requested Ativan PRN because he has been some what anxious and repeatedly asking to talk on phone. Patient has no agitation or aggressive behaviors while in [facility] for the last 30 hours.   MSE: He was calm and cooperative. He has anxious mood and appropriate affect. He has normal speech and thought process seems to be tangential. He has denied suicidal and/or homicidal ideations.    Past Medical History  Diagnosis Date  . Ulcerative colitis   . Schizophrenia     Past Surgical History  Procedure Date  . Mandible fracture surgery   . Flexible sigmoidoscopy 12/05/2011    Procedure: FLEXIBLE SIGMOIDOSCOPY;  Surgeon: Beryle Beams, MD;  Location: [facility] ENDOSCOPY;  Service: Endoscopy;  Laterality: N/A;    History reviewed. No pertinent family history.  Social History:  reports that he has been smoking Cigarettes.  He has a 4.5 pack-year smoking history. He does not have any smokeless tobacco history on file. He reports that he uses illicit drugs (Cocaine). He reports that he does not drink alcohol.  Allergies:  Allergies  Allergen Reactions  . Depakote (Divalproex Sodium) Other (See Comments)    Reaction: seizures   . Lithium Other (See Comments)    Reaction: rectal bleeding  . Nsaids Other (See Comments)    Reaction: rectal bleeding    Medications: I have reviewed the patient's current medications.  Results for orders placed during the hospital  encounter of 10/21/12 (from the past 48 hour(s))  URINALYSIS, ROUTINE W REFLEX MICROSCOPIC     Status: Normal   Collection Time   10/21/12  8:23 PM      Component Value Range Comment   Color, Urine YELLOW  YELLOW    APPearance CLEAR  CLEAR    Specific Gravity, Urine 1.008  1.005 - 1.030    pH 6.0  5.0 - 8.0    Glucose, UA NEGATIVE  NEGATIVE mg/dL    Hgb urine dipstick NEGATIVE  NEGATIVE    Bilirubin Urine NEGATIVE  NEGATIVE    Ketones, ur NEGATIVE  NEGATIVE mg/dL    Protein, ur NEGATIVE  NEGATIVE mg/dL    Urobilinogen, UA 0.2  0.0 - 1.0 mg/dL    Nitrite NEGATIVE  NEGATIVE    Leukocytes, UA NEGATIVE  NEGATIVE MICROSCOPIC NOT DONE ON URINES WITH NEGATIVE PROTEIN, BLOOD, LEUKOCYTES, NITRITE, OR GLUCOSE <1000 mg/dL.  URINE RAPID DRUG SCREEN (HOSP PERFORMED)     Status: Abnormal   Collection Time   10/21/12  8:23 PM      Component Value Range Comment   Opiates NONE DETECTED  NONE DETECTED    Cocaine POSITIVE (*) NONE DETECTED    Benzodiazepines NONE DETECTED  NONE DETECTED    Amphetamines NONE DETECTED  NONE DETECTED    Tetrahydrocannabinol NONE DETECTED  NONE DETECTED    Barbiturates NONE DETECTED  NONE DETECTED   CBC     Status: Abnormal   Collection Time   10/21/12  8:40 PM  Component Value Range Comment   WBC 12.9 (*) 4.0 - 10.5 K/uL    RBC 5.40  4.22 - 5.81 MIL/uL    Hemoglobin 13.5  13.0 - 17.0 g/dL    HCT 41.5  39.0 - 52.0 %    MCV 76.9 (*) 78.0 - 100.0 fL    MCH 25.0 (*) 26.0 - 34.0 pg    MCHC 32.5  30.0 - 36.0 g/dL    RDW 17.8 (*) 11.5 - 15.5 %    Platelets 309  150 - 400 K/uL   COMPREHENSIVE METABOLIC PANEL     Status: Abnormal   Collection Time   10/21/12  8:40 PM      Component Value Range Comment   Sodium 139  135 - 145 mEq/L    Potassium 3.2 (*) 3.5 - 5.1 mEq/L    Chloride 99  96 - 112 mEq/L    CO2 26  19 - 32 mEq/L    Glucose, Bld 75  70 - 99 mg/dL    BUN 8  6 - 23 mg/dL    Creatinine, Ser 0.92  0.50 - 1.35 mg/dL    Calcium 9.6  8.4 - 10.5 mg/dL    Total  Protein 8.4 (*) 6.0 - 8.3 g/dL    Albumin 4.4  3.5 - 5.2 g/dL    AST 28  0 - 37 U/L    ALT 12  0 - 53 U/L    Alkaline Phosphatase 94  39 - 117 U/L    Total Bilirubin 0.4  0.3 - 1.2 mg/dL    GFR calc non Af Amer >90  >90 mL/min    GFR calc Af Amer >90  >90 mL/min   ETHANOL     Status: Abnormal   Collection Time   10/21/12  8:40 PM      Component Value Range Comment   Alcohol, Ethyl (B) 133 (*) 0 - 11 mg/dL     No results found.  Positive for aggressive behavior, anxiety and Paranoid schizophrenia. Blood pressure 111/73, pulse 77, temperature 98.8 F (37.1 C), temperature source Oral, resp. rate 17, SpO2 96.00%.   Assessment/Plan:  Schizophrenia, paranoid  Cocaine intoxication  Noncompliant with medication.   Recommendation: Patient meets criteria for acute inpatient psychiatric hospitalization for stabilization with medication management. Will discontinue Risperiodne 2 mg at bed time as patient concern about elevated prolactin and male breast development. He has agree to take Geodon and Ativan for psychosis, and possible agitation and aggressive behaviors.   Chenel Wernli,JANARDHAHA R. 10/23/2012, 3:23 PM
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
## 2012-10-23 NOTE — ED Provider Notes (Signed)
[name] is a 32 y.o. male who is being evaluated for aggressive behavior and psychosis. At this time. He is sitting in the hall, and talking, the phone. He has had periods of anger, and confusion this morning that wax and wane. He was seen by psychiatry yesterday, who advised admitting to a psychiatric facility.  The patient continues to be psychiatrically unstable.  No change in treatment plan. ACT is trying to place the patient.  Richarda Blade, MD 10/23/12 1017
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
## 2012-10-24 MED ORDER — LORAZEPAM 1 MG PO TABS: 1.0000 mg | ORAL_TABLET | Freq: Two times a day (BID) | ORAL | Status: DC

## 2012-10-24 MED ORDER — ZIPRASIDONE HCL 20 MG PO CAPS: 20.0000 mg | ORAL_CAPSULE | Freq: Two times a day (BID) | ORAL | Status: DC

## 2012-10-24 MED ORDER — WHITE PETROLATUM GEL
Status: AC
  Administered 2012-10-24: 10:00:00
  Filled 2012-10-24: qty 5
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
## 2012-10-24 NOTE — Progress Notes (Signed)
Pt discussed in LOS. Further interventions and assessment completed.  CM left voice message for Medical Director related disposition
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
## 2012-10-24 NOTE — ED Notes (Addendum)
Patient states he slept well last nite, smiling and talking this morning, denies SI or Hi nad no AVH, appetite is good, up walking in room, has taken his shower this morning and is voicing no complaints, anxious, interacting w/staff

## 2012-10-24 NOTE — ED Provider Notes (Signed)
Pt without issues during the night.  Is calm this am.  Awaiting placement.  Malvin Johns, MD 10/24/12 [phone]

## 2012-10-24 NOTE — ED Provider Notes (Signed)
Patient has been seen and discharged by Dr. Cecilio Asper R. Alvino Chapel, MD 10/24/12 [phone]

## 2012-12-15 ENCOUNTER — Encounter (HOSPITAL_COMMUNITY): Payer: Self-pay | Admitting: *Deleted

## 2012-12-15 ENCOUNTER — Emergency Department (HOSPITAL_COMMUNITY)
Admission: EM | Admit: 2012-12-15 | Discharge: 2012-12-16 | Disposition: A | Discharge status: Other Acute Inpatient Hospital | Payer: Medicare Other | Attending: Emergency Medicine | Admitting: Emergency Medicine

## 2012-12-15 DIAGNOSIS — F17200 Nicotine dependence, unspecified, uncomplicated: Secondary | ICD-10-CM | POA: Insufficient documentation

## 2012-12-15 DIAGNOSIS — Z8719 Personal history of other diseases of the digestive system: Secondary | ICD-10-CM | POA: Insufficient documentation

## 2012-12-15 DIAGNOSIS — F209 Schizophrenia, unspecified: Secondary | ICD-10-CM | POA: Insufficient documentation

## 2012-12-15 LAB — COMPREHENSIVE METABOLIC PANEL
AST: 43 U/L — ABNORMAL HIGH (ref 0–37)
CO2: 28 mEq/L (ref 19–32)
Calcium: 9 mg/dL (ref 8.4–10.5)
Creatinine, Ser: 0.84 mg/dL (ref 0.50–1.35)
GFR calc non Af Amer: >90 mL/min (ref >90)
Sodium: 141 mEq/L (ref 135–145)
Total Protein: 8.2 g/dL (ref 6.0–8.3)

## 2012-12-15 LAB — CBC WITH DIFFERENTIAL/PLATELET
Basophils Absolute: 0 10*3/uL (ref 0.0–0.1)
Basophils Relative: 0 % (ref 0–1)
Eosinophils Absolute: 0.6 10*3/uL (ref 0.0–0.7)
Eosinophils Relative: 5 % (ref 0–5)
HCT: 40.6 % (ref 39.0–52.0)
Lymphocytes Relative: 14 % (ref 12–46)
MCH: 27.1 pg (ref 26.0–34.0)
MCHC: 33.3 g/dL (ref 30.0–36.0)
MCV: 81.4 fL (ref 78.0–100.0)
Monocytes Absolute: 0.9 10*3/uL (ref 0.1–1.0)
Platelets: 419 10*3/uL — ABNORMAL HIGH (ref 150–400)
RDW: 16.2 % — ABNORMAL HIGH (ref 11.5–15.5)

## 2012-12-15 LAB — RAPID URINE DRUG SCREEN, HOSP PERFORMED
Amphetamines: NOT DETECTED
Cocaine: POSITIVE — AB
Opiates: NOT DETECTED

## 2012-12-15 LAB — SALICYLATE LEVEL: Salicylate Lvl: <2 mg/dL — ABNORMAL LOW (ref 2.8–20.0)

## 2012-12-15 MED ORDER — RISPERIDONE 2 MG PO TABS
2.0000 mg | ORAL_TABLET | Freq: Two times a day (BID) | ORAL | Status: DC
  Administered 2012-12-15 – 2012-12-16 (×2): 2 mg via ORAL
  Filled 2012-12-15 (×2): qty 1

## 2012-12-15 MED ORDER — ONDANSETRON HCL 4 MG PO TABS: 4.0000 mg | ORAL_TABLET | Freq: Three times a day (TID) | ORAL | Status: DC | PRN

## 2012-12-15 MED ORDER — NICOTINE 21 MG/24HR TD PT24: 21.0000 mg | MEDICATED_PATCH | Freq: Every day | TRANSDERMAL | Status: DC

## 2012-12-15 MED ORDER — LORAZEPAM 1 MG PO TABS
1.0000 mg | ORAL_TABLET | Freq: Three times a day (TID) | ORAL | Status: DC | PRN
  Administered 2012-12-16: 1 mg via ORAL
  Filled 2012-12-15: qty 1

## 2012-12-15 MED ORDER — ZIPRASIDONE MESYLATE 20 MG IM SOLR
20.0000 mg | Freq: Once | INTRAMUSCULAR | Status: AC
  Administered 2012-12-15: 20 mg via INTRAMUSCULAR
  Filled 2012-12-15: qty 20

## 2012-12-15 MED ORDER — ACETAMINOPHEN 325 MG PO TABS
650.0000 mg | ORAL_TABLET | ORAL | Status: DC | PRN
  Administered 2012-12-15 – 2012-12-16 (×2): 650 mg via ORAL
  Filled 2012-12-15 (×2): qty 2

## 2012-12-15 MED ORDER — ZOLPIDEM TARTRATE 5 MG PO TABS: 5.0000 mg | ORAL_TABLET | Freq: Every evening | ORAL | Status: DC | PRN

## 2012-12-15 MED ORDER — ALUM & MAG HYDROXIDE-SIMETH 200-200-20 MG/5ML PO SUSP: 30.0000 mL | ORAL | Status: DC | PRN

## 2012-12-15 NOTE — ED Notes (Signed)
Medicated for sore throat--sandwich and soda given.  Pt remains pleasant and cooperative, up to the desk to use the phone w/o incident.

## 2012-12-15 NOTE — ED Notes (Signed)
Pt upset after Telepsych because he couldn't understand all of what the doctor said but he thinks that he's going to keep him here. Writer tried to explain to him that a report will be faxed here and given to the EDP here and he or she will decide whether or not to agree or disagree with the recommendations of the psychiatrist. Pt didn't want to hear this from Probation officer. Pt asked for writer to open the door but the writer didn't open it quick enough for the pt as it was stuck. Pt requested to speak to male RN that triaged him and came out of room up to nursing station and told other RN he is not comfortable with current writer/RN being his RN and he knows his rights.

## 2012-12-15 NOTE — ED Notes (Signed)
Up to the bathroom 

## 2012-12-15 NOTE — BHH Counselor (Signed)
Faxed referral to [facility]. Roderic Palau at [facility] - there are no beds available but they are expecting d/c in am. He will review referral.  Arnold Long, [place] Assessment Counselor

## 2012-12-15 NOTE — ED Notes (Signed)
Pt pleasant and cooperative with staff. No s/s of distress noted. Pt denies SI/HI currently.
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
## 2012-12-15 NOTE — BHH Counselor (Signed)
Writer contacted [facility] and was informed that there were not any beds for this patient.  Writer informed the assessment office and updated the shift report to reflect that the incoming staff member would be able to seek placement.

## 2012-12-15 NOTE — ED Notes (Signed)
Dr Tomi Bamberger seen pt.

## 2012-12-15 NOTE — ED Notes (Signed)
Pt changed into blue scrubs.  Pt wanded by security.  Pt belongings: pair of black nike gym shoes, pair of black socks, pair of black jeans,  Pair of boxers, black t-shirt, and blue jacket.  All items placed behind nurses station.

## 2012-12-15 NOTE — ED Notes (Signed)
Sleeping, easily aroused, NAD.  Pt up sitting on the side of the bed eatting.  Pt is aware that the MD is changing his medication and that he will not be leaving today.
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
## 2012-12-15 NOTE — ED Notes (Signed)
Telepsych info faxed/called into [facility]

## 2012-12-15 NOTE — ED Notes (Signed)
NAD, watching tv, pt requesting something for sore throat

## 2012-12-15 NOTE — ED Notes (Signed)
Pt became angry-loud/cursing - while talking on the phone.

## 2012-12-15 NOTE — ED Notes (Signed)
Pt told Dr Ferrel Logan he has Ulcerative Colitis and Anemia.

## 2012-12-15 NOTE — ED Notes (Addendum)
Pt's mother phone [phone].  Mom Jessup Ogas) is requesting to speak to the MD, will relay request to MD.

## 2012-12-15 NOTE — ED Notes (Signed)
Patient is alert and oriented x3.  He is being brought in by [facility] for disruptive behavior. His mother has taken out IVC papers on the patient.  Patient denies any pain
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
## 2012-12-15 NOTE — ED Notes (Signed)
Pt initially laid on his bed and talked to Probation officer though was speaking loudly, non-sensical, disorganized thinking, blaming others for all the problems that occurred tonight and for the reason he was in jail last week. Suddenly pt jumped up off the bed and sat up and said he wanted to talk to writer face to face and that women die quickly. He also requested that the door be opened more so that people would hear [place] talking. When the other RN came by to do a 15 minute check he requested that she talk to him instead of the Probation officer because he felt writer was trying to trick him basically. At that time both RN's left the room and writer told him she'd get his paperwork ready for him to talk to doctor on the monitor but that may or may not mean that he would go home tonight.

## 2012-12-15 NOTE — ED Notes (Signed)
Up to the desk on the phone 

## 2012-12-16 MED ORDER — WHITE PETROLATUM GEL
Status: AC
  Administered 2012-12-16: 1
  Filled 2012-12-16: qty 15

## 2012-12-16 NOTE — BHH Counselor (Signed)
Patient accepted to [facility]. The accepting physician is Dr. Alcide Clever. The call report # is [phone]. Pt awaiting transport via sheriff.

## 2012-12-16 NOTE — ED Provider Notes (Addendum)
0715. Pt is awake in bed interacting with staff.  No acute overnight events reported.  Note stable VS.  Pt is awaiting evaluation for potential placement at [facility].  Will continue to follow.  Perlie Mayo, MD 12/16/12 [id number]  1153.  Pt has been accepted at the [facility].  No acute events have been reported.  Perlie Mayo, MD 12/16/12 1154

## 2013-01-05 ENCOUNTER — Encounter (HOSPITAL_COMMUNITY): Payer: Self-pay | Admitting: *Deleted

## 2013-01-05 ENCOUNTER — Emergency Department (HOSPITAL_COMMUNITY)
Admission: EM | Admit: 2013-01-05 | Discharge: 2013-01-06 | Disposition: A | Discharge status: Home / Self Care | Payer: Medicare Other | Attending: Emergency Medicine | Admitting: Emergency Medicine

## 2013-01-05 DIAGNOSIS — R440 Auditory hallucinations: Secondary | ICD-10-CM

## 2013-01-05 DIAGNOSIS — F17200 Nicotine dependence, unspecified, uncomplicated: Secondary | ICD-10-CM | POA: Insufficient documentation

## 2013-01-05 DIAGNOSIS — Z8719 Personal history of other diseases of the digestive system: Secondary | ICD-10-CM | POA: Insufficient documentation

## 2013-01-05 DIAGNOSIS — F2089 Other schizophrenia: Secondary | ICD-10-CM | POA: Insufficient documentation

## 2013-01-05 DIAGNOSIS — H5316 Psychophysical visual disturbances: Secondary | ICD-10-CM | POA: Insufficient documentation

## 2013-01-05 DIAGNOSIS — F209 Schizophrenia, unspecified: Secondary | ICD-10-CM

## 2013-01-05 LAB — CBC
Platelets: 266 10*3/uL (ref 150–400)
RBC: 5.05 MIL/uL (ref 4.22–5.81)
RDW: 17.8 % — ABNORMAL HIGH (ref 11.5–15.5)
WBC: 7.7 10*3/uL (ref 4.0–10.5)

## 2013-01-05 LAB — RAPID URINE DRUG SCREEN, HOSP PERFORMED
Amphetamines: NOT DETECTED
Benzodiazepines: NOT DETECTED
Tetrahydrocannabinol: NOT DETECTED

## 2013-01-05 LAB — COMPREHENSIVE METABOLIC PANEL
ALT: 16 U/L (ref 0–53)
AST: 18 U/L (ref 0–37)
Albumin: 3.6 g/dL (ref 3.5–5.2)
Alkaline Phosphatase: 76 U/L (ref 39–117)
CO2: 26 mEq/L (ref 19–32)
Chloride: 101 mEq/L (ref 96–112)
Creatinine, Ser: 0.75 mg/dL (ref 0.50–1.35)
GFR calc non Af Amer: >90 mL/min (ref >90)
Potassium: 3.3 mEq/L — ABNORMAL LOW (ref 3.5–5.1)
Sodium: 137 mEq/L (ref 135–145)
Total Bilirubin: 0.4 mg/dL (ref 0.3–1.2)

## 2013-01-05 LAB — SALICYLATE LEVEL: Salicylate Lvl: <2 mg/dL — ABNORMAL LOW (ref 2.8–20.0)

## 2013-01-05 LAB — ACETAMINOPHEN LEVEL: Acetaminophen (Tylenol), Serum: <15 ug/mL (ref 10–30)

## 2013-01-05 MED ORDER — ONDANSETRON HCL 4 MG PO TABS: 4.0000 mg | ORAL_TABLET | Freq: Three times a day (TID) | ORAL | Status: DC | PRN

## 2013-01-05 MED ORDER — ACETAMINOPHEN 325 MG PO TABS
650.0000 mg | ORAL_TABLET | ORAL | Status: DC | PRN
  Administered 2013-01-05 (×2): 650 mg via ORAL
  Filled 2013-01-05 (×2): qty 2

## 2013-01-05 MED ORDER — HYDROXYZINE HCL 25 MG PO TABS
50.0000 mg | ORAL_TABLET | Freq: Once | ORAL | Status: AC
  Administered 2013-01-05: 50 mg via ORAL
  Filled 2013-01-05: qty 2

## 2013-01-05 MED ORDER — ZIPRASIDONE HCL 20 MG PO CAPS
20.0000 mg | ORAL_CAPSULE | Freq: Two times a day (BID) | ORAL | Status: DC
  Administered 2013-01-05 – 2013-01-06 (×2): 20 mg via ORAL
  Filled 2013-01-05 (×2): qty 1

## 2013-01-05 NOTE — ED Notes (Signed)
Pt presents from home stating "I'm hearing voices and I'm depressed." reports hx of same--schizophrenia. Reports voices tell him "Jesus is coming and to hurt myself."

## 2013-01-05 NOTE — ED Provider Notes (Signed)
History     CSN: [id number]  Arrival date & time 01/05/13  1201   First MD Initiated Contact with Patient 01/05/13 1245      Chief Complaint  Patient presents with  . Medical Clearance    (Consider location/radiation/quality/duration/timing/severity/associated sxs/prior treatment) HPI Comments: Pt presents with hallucinations.  Says that he is hearing voice which started a few days ago.  Says that the are telling him to hurt himself.  Seems to be coming more frequently.  Has hx of schizophrenia, says that he is taking his medications as directed.  Denies physical complaints.   Past Medical History  Diagnosis Date  . Ulcerative colitis   . Schizophrenia     Past Surgical History  Procedure Laterality Date  . Mandible fracture surgery    . Flexible sigmoidoscopy  12/05/2011    Procedure: FLEXIBLE SIGMOIDOSCOPY;  Surgeon: Beryle Beams, MD;  Location: [facility] ENDOSCOPY;  Service: Endoscopy;  Laterality: N/A;    History reviewed. No pertinent family history.  History  Substance Use Topics  . Smoking status: Current Every Day Smoker -- 0.25 packs/day for 18 years    Types: Cigarettes  . Smokeless tobacco: Not on file  . Alcohol Use: No      Review of Systems  Constitutional: Negative for fever, chills, diaphoresis and fatigue.  HENT: Negative for congestion, rhinorrhea and sneezing.   Eyes: Negative.   Respiratory: Negative for cough, chest tightness and shortness of breath.   Cardiovascular: Negative for chest pain and leg swelling.  Gastrointestinal: Negative for nausea, vomiting, abdominal pain, diarrhea and blood in stool.  Genitourinary: Negative for frequency, hematuria, flank pain and difficulty urinating.  Musculoskeletal: Negative for back pain and arthralgias.  Skin: Negative for rash.  Neurological: Negative for dizziness, speech difficulty, weakness, numbness and headaches.  Psychiatric/Behavioral: Positive for suicidal ideas, hallucinations and agitation.  Negative for self-injury.    Allergies  Depakote; Lithium; and Nsaids  Home Medications   Current Outpatient Rx  Name  Route  Sig  Dispense  Refill  . Paliperidone Palmitate (INVEGA SUSTENNA) 156 MG/ML SUSP   Intramuscular   Inject 156 mg into the muscle every 30 (thirty) days.           BP 121/73  Pulse 83  Temp(Src) 98.9 F (37.2 C) (Oral)  Resp 18  SpO2 97%  Physical Exam  Constitutional: He is oriented to person, place, and time. He appears well-developed and well-nourished.  HENT:  Head: Normocephalic and atraumatic.  Eyes: Pupils are equal, round, and reactive to light.  Neck: Normal range of motion. Neck supple.  Cardiovascular: Normal rate, regular rhythm and normal heart sounds.   Pulmonary/Chest: Effort normal and breath sounds normal. No respiratory distress. He has no wheezes. He has no rales. He exhibits no tenderness.  Abdominal: Soft. Bowel sounds are normal. There is no tenderness. There is no rebound and no guarding.  Musculoskeletal: Normal range of motion. He exhibits no edema.  Lymphadenopathy:    He has no cervical adenopathy.  Neurological: He is alert and oriented to person, place, and time.  Skin: Skin is warm and dry. No rash noted.  Psychiatric:  Pt with bizarre behavior, washing his hands excessively.  At times appears to be talking to someone who is not there    ED Course  Procedures (including critical care time)  Results for orders placed during the hospital encounter of 01/05/13  ACETAMINOPHEN LEVEL      Result Value Range   Acetaminophen (Tylenol), Serum <15.0  10 - 30 ug/mL  CBC      Result Value Range   WBC 7.7  4.0 - 10.5 K/uL   RBC 5.05  4.22 - 5.81 MIL/uL   Hemoglobin 13.8  13.0 - 17.0 g/dL   HCT 41.2  39.0 - 52.0 %   MCV 81.6  78.0 - 100.0 fL   MCH 27.3  26.0 - 34.0 pg   MCHC 33.5  30.0 - 36.0 g/dL   RDW 17.8 (*) 11.5 - 15.5 %   Platelets 266  150 - 400 K/uL  COMPREHENSIVE METABOLIC PANEL      Result Value Range    Sodium 137  135 - 145 mEq/L   Potassium 3.3 (*) 3.5 - 5.1 mEq/L   Chloride 101  96 - 112 mEq/L   CO2 26  19 - 32 mEq/L   Glucose, Bld 125 (*) 70 - 99 mg/dL   BUN 11  6 - 23 mg/dL   Creatinine, Ser 0.75  0.50 - 1.35 mg/dL   Calcium 8.9  8.4 - 10.5 mg/dL   Total Protein 7.4  6.0 - 8.3 g/dL   Albumin 3.6  3.5 - 5.2 g/dL   AST 18  0 - 37 U/L   ALT 16  0 - 53 U/L   Alkaline Phosphatase 76  39 - 117 U/L   Total Bilirubin 0.4  0.3 - 1.2 mg/dL   GFR calc non Af Amer >90  >90 mL/min   GFR calc Af Amer >90  >90 mL/min  ETHANOL      Result Value Range   Alcohol, Ethyl (B) <11  0 - 11 mg/dL  SALICYLATE LEVEL      Result Value Range   Salicylate Lvl <5.4 (*) 2.8 - 20.0 mg/dL  URINE RAPID DRUG SCREEN (HOSP PERFORMED)      Result Value Range   Opiates NONE DETECTED  NONE DETECTED   Cocaine POSITIVE (*) NONE DETECTED   Benzodiazepines NONE DETECTED  NONE DETECTED   Amphetamines NONE DETECTED  NONE DETECTED   Tetrahydrocannabinol NONE DETECTED  NONE DETECTED   Barbiturates NONE DETECTED  NONE DETECTED   No results found.    No diagnosis found.    MDM  Pt awaiting assessment/possible placement by ACT        Malvin Johns, MD 01/05/13 1659
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
## 2013-01-05 NOTE — ED Notes (Signed)
Meal given

## 2013-01-06 NOTE — BHH Counselor (Signed)
Per nurse-Sheila Mr. Vinje will be discharged home by EDP-Dr. Wilson Singer. Writer provided patient with the appropriate follow up referrals.

## 2013-01-06 NOTE — ED Provider Notes (Signed)
Filed Vitals:   01/06/13 0649  BP: 110/76  Pulse: 81  Temp: 98 F (36.7 C)  Resp: 20    8:09 AM Pt assessed. Requesting to go to [facility]. When explained that we are attempting to place him elsewhere he said he would rather just leave if cannot go to [employer] [facility]. Pt was declined at [facility] because of chronicity. He is here voluntarily. HE does admit to hearing voices, but has a history of this. He denies SI or HI. Has outpt FU.   Virgel Manifold, MD 01/06/13 [phone]

## 2013-01-06 NOTE — Progress Notes (Signed)
Patient has been declined at [facility] by Waylan Boga NP. Patient's symptoms are chronic and does not meet criteria for inpatient crisis stabilization.

## 2013-04-15 DIAGNOSIS — K922 Gastrointestinal hemorrhage, unspecified: Secondary | ICD-10-CM

## 2013-04-15 HISTORY — DX: Gastrointestinal hemorrhage, unspecified: K92.2

## 2013-04-20 ENCOUNTER — Emergency Department (HOSPITAL_COMMUNITY): Payer: Medicare Other

## 2013-04-20 ENCOUNTER — Encounter (HOSPITAL_COMMUNITY): Payer: Self-pay

## 2013-04-20 ENCOUNTER — Inpatient Hospital Stay (HOSPITAL_COMMUNITY)
Admission: EM | Admit: 2013-04-20 | Discharge: 2013-04-27 | DRG: 386 | Disposition: A | Discharge status: Home / Self Care | Payer: Medicare Other | Attending: Internal Medicine | Admitting: Internal Medicine

## 2013-04-20 DIAGNOSIS — F411 Generalized anxiety disorder: Secondary | ICD-10-CM | POA: Diagnosis present

## 2013-04-20 DIAGNOSIS — Z9119 Patient's noncompliance with other medical treatment and regimen: Secondary | ICD-10-CM

## 2013-04-20 DIAGNOSIS — F17200 Nicotine dependence, unspecified, uncomplicated: Secondary | ICD-10-CM | POA: Diagnosis present

## 2013-04-20 DIAGNOSIS — F3289 Other specified depressive episodes: Secondary | ICD-10-CM | POA: Diagnosis present

## 2013-04-20 DIAGNOSIS — K51919 Ulcerative colitis, unspecified with unspecified complications: Secondary | ICD-10-CM

## 2013-04-20 DIAGNOSIS — F1910 Other psychoactive substance abuse, uncomplicated: Secondary | ICD-10-CM | POA: Diagnosis present

## 2013-04-20 DIAGNOSIS — F200 Paranoid schizophrenia: Secondary | ICD-10-CM | POA: Diagnosis present

## 2013-04-20 DIAGNOSIS — E876 Hypokalemia: Secondary | ICD-10-CM | POA: Diagnosis present

## 2013-04-20 DIAGNOSIS — R1084 Generalized abdominal pain: Secondary | ICD-10-CM | POA: Diagnosis present

## 2013-04-20 DIAGNOSIS — E860 Dehydration: Secondary | ICD-10-CM | POA: Diagnosis present

## 2013-04-20 DIAGNOSIS — F329 Major depressive disorder, single episode, unspecified: Secondary | ICD-10-CM | POA: Diagnosis present

## 2013-04-20 DIAGNOSIS — K5100 Ulcerative (chronic) pancolitis without complications: Principal | ICD-10-CM | POA: Diagnosis present

## 2013-04-20 DIAGNOSIS — Z91199 Patient's noncompliance with other medical treatment and regimen due to unspecified reason: Secondary | ICD-10-CM

## 2013-04-20 DIAGNOSIS — F209 Schizophrenia, unspecified: Secondary | ICD-10-CM

## 2013-04-20 DIAGNOSIS — K5190 Ulcerative colitis, unspecified, without complications: Secondary | ICD-10-CM | POA: Diagnosis present

## 2013-04-20 DIAGNOSIS — E875 Hyperkalemia: Secondary | ICD-10-CM | POA: Diagnosis present

## 2013-04-20 LAB — COMPREHENSIVE METABOLIC PANEL
BUN: 9 mg/dL (ref 6–23)
CO2: 26 mEq/L (ref 19–32)
Calcium: 8.1 mg/dL — ABNORMAL LOW (ref 8.4–10.5)
Creatinine, Ser: 0.96 mg/dL (ref 0.50–1.35)
GFR calc Af Amer: >90 mL/min (ref >90)
GFR calc non Af Amer: >90 mL/min (ref >90)
Glucose, Bld: 166 mg/dL — ABNORMAL HIGH (ref 70–99)
Total Protein: 6.5 g/dL (ref 6.0–8.3)

## 2013-04-20 LAB — CBC WITH DIFFERENTIAL/PLATELET
Basophils Absolute: 0 10*3/uL (ref 0.0–0.1)
Basophils Relative: 0 % (ref 0–1)
Eosinophils Absolute: 2.4 10*3/uL — ABNORMAL HIGH (ref 0.0–0.7)
HCT: 36.1 % — ABNORMAL LOW (ref 39.0–52.0)
Hemoglobin: 12.5 g/dL — ABNORMAL LOW (ref 13.0–17.0)
MCH: 28.5 pg (ref 26.0–34.0)
MCHC: 34.6 g/dL (ref 30.0–36.0)
Monocytes Absolute: 1.7 10*3/uL — ABNORMAL HIGH (ref 0.1–1.0)
Neutro Abs: 11.4 10*3/uL — ABNORMAL HIGH (ref 1.7–7.7)
RDW: 14.8 % (ref 11.5–15.5)

## 2013-04-20 LAB — RAPID URINE DRUG SCREEN, HOSP PERFORMED
Amphetamines: NOT DETECTED
Benzodiazepines: NOT DETECTED
Cocaine: POSITIVE — AB
Opiates: POSITIVE — AB
Tetrahydrocannabinol: POSITIVE — AB

## 2013-04-20 LAB — LIPASE, BLOOD: Lipase: 20 U/L (ref 11–59)

## 2013-04-20 LAB — OCCULT BLOOD, POC DEVICE: Fecal Occult Bld: POSITIVE — AB

## 2013-04-20 MED ORDER — ONDANSETRON HCL 4 MG/2ML IJ SOLN
4.0000 mg | Freq: Once | INTRAMUSCULAR | Status: AC
  Administered 2013-04-20: 4 mg via INTRAVENOUS
  Filled 2013-04-20: qty 2

## 2013-04-20 MED ORDER — HYDROCODONE-ACETAMINOPHEN 5-325 MG PO TABS
1.0000 | ORAL_TABLET | ORAL | Status: DC | PRN
  Administered 2013-04-21 – 2013-04-27 (×27): 2 via ORAL
  Filled 2013-04-20 (×28): qty 2

## 2013-04-20 MED ORDER — METHYLPREDNISOLONE SODIUM SUCC 125 MG IJ SOLR
125.0000 mg | Freq: Once | INTRAMUSCULAR | Status: AC
  Administered 2013-04-20: 125 mg via INTRAVENOUS
  Filled 2013-04-20: qty 2

## 2013-04-20 MED ORDER — HYDROMORPHONE HCL PF 1 MG/ML IJ SOLN
1.0000 mg | Freq: Once | INTRAMUSCULAR | Status: AC
  Administered 2013-04-20: 1 mg via INTRAVENOUS
  Filled 2013-04-20: qty 1

## 2013-04-20 MED ORDER — SODIUM CHLORIDE 0.9 % IV SOLN
INTRAVENOUS | Status: DC
  Administered 2013-04-20 – 2013-04-21 (×2): via INTRAVENOUS

## 2013-04-20 MED ORDER — SODIUM CHLORIDE 0.9 % IV BOLUS (SEPSIS)
1000.0000 mL | Freq: Once | INTRAVENOUS | Status: AC
  Administered 2013-04-20: 1000 mL via INTRAVENOUS

## 2013-04-20 MED ORDER — POTASSIUM CHLORIDE CRYS ER 20 MEQ PO TBCR
40.0000 meq | EXTENDED_RELEASE_TABLET | Freq: Once | ORAL | Status: AC
  Administered 2013-04-20: 40 meq via ORAL
  Filled 2013-04-20: qty 2

## 2013-04-20 MED ORDER — SODIUM CHLORIDE 0.9 % IV SOLN
Freq: Once | INTRAVENOUS | Status: AC
  Administered 2013-04-20: 12:00:00 via INTRAVENOUS

## 2013-04-20 MED ORDER — ONDANSETRON HCL 4 MG/2ML IJ SOLN
4.0000 mg | Freq: Four times a day (QID) | INTRAMUSCULAR | Status: DC | PRN
  Administered 2013-04-25 – 2013-04-27 (×4): 4 mg via INTRAVENOUS
  Filled 2013-04-20 (×5): qty 2

## 2013-04-20 MED ORDER — WHITE PETROLATUM GEL
Status: AC
  Administered 2013-04-20: 0.2
  Filled 2013-04-20: qty 5

## 2013-04-20 MED ORDER — PREDNISONE 50 MG PO TABS
50.0000 mg | ORAL_TABLET | Freq: Every day | ORAL | Status: DC
  Administered 2013-04-21: 50 mg via ORAL
  Filled 2013-04-20 (×2): qty 1

## 2013-04-20 MED ORDER — MESALAMINE 400 MG PO CPDR
400.0000 mg | DELAYED_RELEASE_CAPSULE | Freq: Three times a day (TID) | ORAL | Status: DC
  Administered 2013-04-20 – 2013-04-21 (×3): 400 mg via ORAL
  Filled 2013-04-20 (×5): qty 1

## 2013-04-20 MED ORDER — MORPHINE SULFATE 4 MG/ML IJ SOLN
4.0000 mg | Freq: Once | INTRAMUSCULAR | Status: AC
  Administered 2013-04-20: 4 mg via INTRAVENOUS
  Filled 2013-04-20: qty 1

## 2013-04-20 MED ORDER — ONDANSETRON HCL 4 MG PO TABS
4.0000 mg | ORAL_TABLET | Freq: Four times a day (QID) | ORAL | Status: DC | PRN
  Administered 2013-04-22: 4 mg via ORAL
  Filled 2013-04-20: qty 1

## 2013-04-20 NOTE — ED Provider Notes (Signed)
History    CSN: [id number] Arrival date & time 04/20/13  1224  First MD Initiated Contact with Patient 04/20/13 [phone]     Chief Complaint  Patient presents with  . Abdominal Pain  . Rectal Bleeding   (Consider location/radiation/quality/duration/timing/severity/associated sxs/prior Treatment) HPI [name] is a 32 y.o. male who presents to ED with complaint of abdominal pain and rectal bleeding. States bright red blood with bowel movements for a week. Increased abdominal pain. States hx of ulcerative colitis. Not on any medications at this time. Has not tried anything for pain. No fever. No nausea or vomiting. Pain is crampy, non radiating, diffuse. No other complaints.   Past Medical History  Diagnosis Date  . Ulcerative colitis   . Schizophrenia    Past Surgical History  Procedure Laterality Date  . Mandible fracture surgery    . Flexible sigmoidoscopy  12/05/2011    Procedure: FLEXIBLE SIGMOIDOSCOPY;  Surgeon: Beryle Beams, MD;  Location: [facility] ENDOSCOPY;  Service: Endoscopy;  Laterality: N/A;   No family history on file. History  Substance Use Topics  . Smoking status: Current Every Day Smoker -- 0.25 packs/day for 18 years    Types: Cigarettes  . Smokeless tobacco: Not on file  . Alcohol Use: No    Review of Systems  Constitutional: Negative for fever and chills.  HENT: Negative for neck pain and neck stiffness.   Respiratory: Negative.   Cardiovascular: Negative.   Gastrointestinal: Positive for abdominal pain, diarrhea and blood in stool. Negative for nausea and vomiting.  Genitourinary: Negative for flank pain.    Allergies  Depakote; Lithium; and Nsaids  Home Medications   Current Outpatient Rx  Name  Route  Sig  Dispense  Refill  . risperiDONE (RISPERDAL) 1 MG tablet   Oral   Take 1 mg by mouth daily.          BP 125/74  Temp(Src) 99.3 F (37.4 C) (Oral)  Resp 22  SpO2 98% Physical Exam  Nursing note and vitals reviewed. Constitutional: He  is oriented to person, place, and time. He appears well-developed and well-nourished. No distress.  Eyes: Conjunctivae are normal.  Neck: Neck supple.  Cardiovascular: Normal rate, regular rhythm and normal heart sounds.   Pulmonary/Chest: Effort normal and breath sounds normal. No respiratory distress. He has no wheezes. He has no rales.  Abdominal: Soft. Bowel sounds are normal. He exhibits no distension. There is tenderness. There is no rebound and no guarding.  Diffuse abdominal tenderness  Musculoskeletal: He exhibits no edema.  Neurological: He is alert and oriented to person, place, and time.  Skin: Skin is warm and dry.    ED Course  Procedures (including critical care time) Labs Reviewed  CBC WITH DIFFERENTIAL - Abnormal; Notable for the following:    WBC 17.4 (*)    Hemoglobin 12.5 (*)    HCT 36.1 (*)    Lymphocytes Relative 11 (*)    Eosinophils Relative 14 (*)    Neutro Abs 11.4 (*)    Monocytes Absolute 1.7 (*)    Eosinophils Absolute 2.4 (*)    All other components within normal limits  COMPREHENSIVE METABOLIC PANEL - Abnormal; Notable for the following:    Potassium 2.9 (*)    Glucose, Bld 166 (*)    Calcium 8.1 (*)    Albumin 2.9 (*)    AST 101 (*)    All other components within normal limits  OCCULT BLOOD, POC DEVICE - Abnormal; Notable for the following:  Fecal Occult Bld POSITIVE (*)    All other components within normal limits  CLOSTRIDIUM DIFFICILE BY PCR  LIPASE, BLOOD  OCCULT BLOOD X 1 CARD TO LAB, STOOL  URINE RAPID DRUG SCREEN (HOSP PERFORMED)    Dg Abd 2 Views  04/20/2013   *RADIOLOGY REPORT*  Clinical Data: Abdominal pain  ABDOMEN - 2 VIEW  Comparison: 12/01/2011  Findings: The bowel gas pattern is nonobstructed.  Gas and stool noted throughout the colon up to the rectum.  No abnormal abdominal or pelvic calcifications.  IMPRESSION:  1.  Nonobstructive bowel gas pattern.   Original Report Authenticated By: Kerby Moors, M.D.   Discussed with  Dr. Hilarie Fredrickson, GI, recommended admission to medicine. Start on IV steroids, 90m solumderol, i did tell him that pt already received 1275mand he said it was OK. Start pt mesalamine. WIll inform dr. MaCollene Maresnd will consult tomorrow.   Medications  Mesalamine (ASACOL) DR capsule 400 mg (not administered)  sodium chloride 0.9 % bolus 1,000 mL (0 mLs Intravenous Stopped 04/20/13 1224)  morphine 4 MG/ML injection 4 mg (4 mg Intravenous Given 04/20/13 1109)  ondansetron (ZOFRAN) injection 4 mg (4 mg Intravenous Given 04/20/13 1108)  potassium chloride SA (K-DUR,KLOR-CON) CR tablet 40 mEq (40 mEq Oral Given 04/20/13 1225)  HYDROmorphone (DILAUDID) injection 1 mg (1 mg Intravenous Given 04/20/13 1227)  methylPREDNISolone sodium succinate (SOLU-MEDROL) 125 mg/2 mL injection 125 mg (125 mg Intravenous Given 04/20/13 1226)  0.9 %  sodium chloride infusion ( Intravenous New Bag/Given 04/20/13 1225)   Spoke with teaching service with admit.    1. Ulcerative colitis, acute, unspecified complication     MDM  PT with hx of UC, here with increased abdominal pain, bloody stools. Symptoms started a week ago. No PCP here. Not reliable. Will not be able to afford his medications.  Advised probably at this time given the severity of his symptoms to admit him to medicine with iv steroids, start on mesalamine. Will be followed by GI tomorrow. Spoke with teaching service will admit.   Filed Vitals:   04/20/13 1518 04/20/13 1519 04/20/13 1608 04/20/13 1615  BP: 102/62  101/63   Pulse:  84 81   Temp:   98.5 F (36.9 C)   TempSrc:      Resp:   16   Height:    6' 1"  (1.854 m)  Weight:    182 lb (82.555 kg)  SpO2:  97% 95%      TaRenold GentaPA-C 04/20/13 1629

## 2013-04-20 NOTE — ED Notes (Signed)
Pt states he has had abdominal pain and rectal bleeding.  Pt states bright red blood in toilet bowl upon BM.

## 2013-04-20 NOTE — ED Provider Notes (Addendum)
Suspect diuresis has led to renal insufficiency and severe dehydration with hyperkalemia and hypotension.  Babette Relic, MD 04/20/13 2217  Above note entered on wrong chart inadvertantly.  Medical screening examination/treatment/procedure(s) were performed by non-physician practitioner and as supervising physician I was immediately available for consultation/collaboration.[id number]   Babette Relic, MD 06/10/13 2126

## 2013-04-20 NOTE — H&P (Signed)
Date: 04/20/2013               Patient Name:  [name] MRN: [id number]  DOB: [birth year] Age / Sex: 32 y.o., male   PCP: Pcp Not In System         Medical Service: Internal Medicine Teaching Service         Attending Physician: Dr. Babette Relic, MD    First Contact: Dr. Stann Mainland Pager: [phone]  Second Contact: Dr. Owens Shark Pager: [phone]       After Hours (After 5p/  First Contact Pager: [phone]  weekends / holidays): Second Contact Pager: [phone]   Chief Complaint: "bloody stool"  History of Present Illness:  [name] is a 32 y/o man with history of ulcerative colitis (diagnosed in 2006 s/p flex sig in 2/13 which showed severe left sided UC) and paranoid schizophrenia.  Pt states that approximately one week ago he began having bloody diarrhea and abdominal pain.  He has been having up to eight episodes of diarrhea/day, all with blood, some dark in color.  He has also had associated crampy abdominal pain, worse in the lower abdomen, nothing improves or worsens.  He has not taken anything for this pain.  He has not had any nausea/vomiting and has been able to eat normally.  No fevers/chills, headaches, chest pain, SOB, dysuria.    Pt sees gastroenterologist Dr. Collene Mares but has not been seen in over a year.   He states that he was diagnosed with UC in 2006 and has had four flares since that time, the last being in 2013, all of which have been similar to this one.    Of note, pt also has a history of schizophrenia.  He has been seen at [facility] in the past but does not have any follow-up appointments.  He has been taking risperidone but does not wish to continue taking this medication as he is concerned about male breast development.  He took his last risperidone four days ago.  On chart review, appears that pt was most recently prescribed a combination of Geodon and Ativan but pt does not report taking these medications.   Meds: Current Facility-Administered Medications  Medication Dose  Route Frequency Provider Last Rate Last Dose  . Mesalamine (ASACOL) DR capsule 400 mg  400 mg Oral TID Tatyana A Kirichenko, PA-C       No current outpatient prescriptions on file.   Facility-Administered Medications Ordered in Other Encounters  Medication Dose Route Frequency Provider Last Rate Last Dose  . hydrOXYzine (ATARAX/VISTARIL) tablet 50 mg  50 mg Oral TID PRN Encarnacion Slates, NP      . magnesium hydroxide (MILK OF MAGNESIA) suspension 30 mL  30 mL Oral Daily PRN Encarnacion Slates, NP        Allergies: Allergies as of 04/20/2013 - Review Complete 04/20/2013  Allergen Reaction Noted  . Depakote (divalproex sodium) Other (See Comments) 06/17/2012  . Lithium Other (See Comments) 10/21/2012  . Nsaids Other (See Comments) 10/21/2012   Past Medical History  Diagnosis Date  . Ulcerative colitis   . Schizophrenia    Past Surgical History  Procedure Laterality Date  . Mandible fracture surgery    . Flexible sigmoidoscopy  12/05/2011    Procedure: FLEXIBLE SIGMOIDOSCOPY;  Surgeon: Beryle Beams, MD;  Location: [facility] ENDOSCOPY;  Service: Endoscopy;  Laterality: N/A;   No family history on file. History   Social History  . Marital Status: Single    Spouse Name:  N/A    Number of Children: N/A  . Years of Education: GED   Occupational History  . disabled    Social History Main Topics  . Smoking status: Current Every Day Smoker -- 0.25 packs/day for 20 years    Types: Cigarettes  . Smokeless tobacco: Not on file  . Alcohol Use: No  . Drug Use: Yes    Special: Cocaine     Comment: pt states he rarely uses cocaine  . Sexually Active: Not on file   Other Topics Concern  . Not on file   Social History Narrative   [name] was recently released from prison, currently living at home with his mother.  He is disabled (due to mental illness?).     Review of Systems: ROS: General: no fevers, chills, changes in weight, changes in appetite Skin: no rash HEENT: no blurry vision,  hearing changes, sore throat Pulm: no dyspnea, coughing, wheezing CV: no chest pain, palpitations, shortness of breath Abd: per HPI GU: no dysuria, hematuria, polyuria Ext: no arthralgias, myalgias Neuro: no weakness, numbness, or tingling   Physical Exam: Blood pressure 106/58, pulse 92, temperature 99.3 F (37.4 C), temperature source Oral, resp. rate 22, SpO2 97.00%. General: alert, cooperative with blunted affect, and in no apparent distress HEENT: pupils equal round and reactive to light, vision grossly intact, oropharynx clear and non-erythematous  Neck: supple, no lymphadenopathy, JVD, or carotid bruits Lungs: clear to ascultation bilaterally, normal work of respiration, no wheezes, rales, ronchi Heart: regular rate and rhythm, no murmurs, gallops, or rubs Abdomen: FOBT positive, soft, tender in bilateral lower quadrants, non-distended, normal bowel sounds Extremities: no cyanosis, clubbing, or edema Neurologic: alert & oriented X3, cranial nerves II-XII intact, strength grossly intact, sensation intact to light touch  Lab results: Basic Metabolic Panel:  Recent Labs  04/20/13 1047  NA 139  K 2.9*  CL 103  CO2 26  GLUCOSE 166*  BUN 9  CREATININE 0.96  CALCIUM 8.1*   Liver Function Tests:  Recent Labs  04/20/13 1047  AST 101*  ALT 24  ALKPHOS 71  BILITOT 0.3  PROT 6.5  ALBUMIN 2.9*    Recent Labs  04/20/13 1047  LIPASE 20   CBC:  Recent Labs  04/20/13 1047  WBC 17.4*  NEUTROABS 11.4*  HGB 12.5*  HCT 36.1*  MCV 82.2  PLT 283   Urine Drug Screen: Drugs of Abuse     Component Value Date/Time   LABOPIA NONE DETECTED 01/05/2013 1221   COCAINSCRNUR POSITIVE* 01/05/2013 1221   LABBENZ NONE DETECTED 01/05/2013 1221   AMPHETMU NONE DETECTED 01/05/2013 1221   THCU NONE DETECTED 01/05/2013 1221   LABBARB NONE DETECTED 01/05/2013 1221    Imaging results:  Dg Abd 2 Views  04/20/2013   *RADIOLOGY REPORT*  Clinical Data: Abdominal pain  ABDOMEN - 2 VIEW   Comparison: 12/01/2011  Findings: The bowel gas pattern is nonobstructed.  Gas and stool noted throughout the colon up to the rectum.  No abnormal abdominal or pelvic calcifications.  IMPRESSION:  1.  Nonobstructive bowel gas pattern.   Original Report Authenticated By: Kerby Moors, M.D.    Assessment & Plan by Problem: [name] is a 32 y/o man with history of ulcerative colitis (diagnosed in 2006 s/p flex sig in 2/13 which showed severe left sided UC) and paranoid schizophrenia who presents with an acute ulcerative colitis exacerbation.   1. Ulcerative colitis- Diagnosed in 2006, 4 flares with most recent in 2013. Flex sig in  2/13 showed severe left sided UC.  Pt reports bloody diarrhea, up to 8 episodes/day, with associated abdominal pain for the past week.  No fever, nausea/vomiting. FOBT positive in ED. Received one dose of solumedrol 125 mg IV in ED.  -GI following, appreciate recs -start mesalamine 471m PO TID -start prednisone 568mPO daily with breakfast -hydrocodone-acetominophen for pain -CBC tomorrow -regular diet -Zofran prn for nausea  2. Hypokalemia- On admission, pt's potassium level was 2.9.  He was given K-dur 4069mtablet in the ED just before noon.  -give additional K-dur 81m18mablet at 4pm -follow up BMP tomorrow morning + Mg  3. Schizophrenia, paranoid- [facility] but switched to Geodon and Ativan in 1/14 due to concerns about male breast development.  Pt reports compliance with risperidone until 4 days ago (unclear which medication he has been taking).   [facility]  -check UDS as positive for cocaine in 3/14  4. DVT PPX- SCDs  Dispo: Disposition is deferred at this time, awaiting improvement of current medical problems. Anticipated discharge in approximately 1-2 day(s).   The patient does not have a current PCP (Pcp Not In System) and does need [facility] after discharge.   Signed: MorgIvin Poot 04/20/2013, 3:12 PM

## 2013-04-20 NOTE — ED Notes (Signed)
Pt states, "I feel like I'm about to fall out."

## 2013-04-21 DIAGNOSIS — K5190 Ulcerative colitis, unspecified, without complications: Secondary | ICD-10-CM

## 2013-04-21 DIAGNOSIS — F209 Schizophrenia, unspecified: Secondary | ICD-10-CM

## 2013-04-21 LAB — CBC
MCH: 27.9 pg (ref 26.0–34.0)
MCHC: 33.5 g/dL (ref 30.0–36.0)
MCV: 83.3 fL (ref 78.0–100.0)
Platelets: 300 10*3/uL (ref 150–400)

## 2013-04-21 LAB — BASIC METABOLIC PANEL
BUN: 6 mg/dL (ref 6–23)
Creatinine, Ser: 0.83 mg/dL (ref 0.50–1.35)
GFR calc Af Amer: >90 mL/min (ref >90)
GFR calc non Af Amer: >90 mL/min (ref >90)

## 2013-04-21 LAB — CLOSTRIDIUM DIFFICILE BY PCR: Toxigenic C. Difficile by PCR: NEGATIVE

## 2013-04-21 MED ORDER — PREDNISONE 20 MG PO TABS
20.0000 mg | ORAL_TABLET | Freq: Two times a day (BID) | ORAL | Status: DC
  Administered 2013-04-21 – 2013-04-23 (×3): 20 mg via ORAL
  Filled 2013-04-21 (×6): qty 1

## 2013-04-21 MED ORDER — LORAZEPAM 1 MG PO TABS
1.0000 mg | ORAL_TABLET | Freq: Four times a day (QID) | ORAL | Status: DC | PRN
  Administered 2013-04-22 – 2013-04-24 (×4): 1 mg via ORAL
  Filled 2013-04-21 (×4): qty 1

## 2013-04-21 MED ORDER — PANTOPRAZOLE SODIUM 40 MG PO TBEC: 40.0000 mg | DELAYED_RELEASE_TABLET | Freq: Every day | ORAL | Status: DC

## 2013-04-21 MED ORDER — MESALAMINE 400 MG PO CPDR
1600.0000 mg | DELAYED_RELEASE_CAPSULE | Freq: Three times a day (TID) | ORAL | Status: DC
  Administered 2013-04-21 – 2013-04-26 (×17): 1600 mg via ORAL
  Filled 2013-04-21 (×21): qty 4

## 2013-04-21 MED ORDER — KETOROLAC TROMETHAMINE 15 MG/ML IJ SOLN: 15.0000 mg | Freq: Three times a day (TID) | INTRAMUSCULAR | Status: DC | PRN

## 2013-04-21 MED ORDER — POTASSIUM CHLORIDE CRYS ER 20 MEQ PO TBCR
40.0000 meq | EXTENDED_RELEASE_TABLET | Freq: Once | ORAL | Status: AC
  Administered 2013-04-21: 40 meq via ORAL
  Filled 2013-04-21: qty 2

## 2013-04-21 MED ORDER — MESALAMINE 400 MG PO CPDR
1200.0000 mg | DELAYED_RELEASE_CAPSULE | Freq: Three times a day (TID) | ORAL | Status: DC
  Filled 2013-04-21 (×2): qty 3

## 2013-04-21 NOTE — Progress Notes (Signed)
Subjective: The patient continues to note abdominal pain and several bloody bowel movements yesterday.  He continues to decline Risperdal due to perceived side effects, but asks for Zyprexa.  Objective: Vital signs in last 24 hours: Filed Vitals:   26-Apr-2013 1747 04-26-2013 2205 04/21/13 0638 04/21/13 1315  BP: 114/62 122/66 110/69 113/62  Pulse: 87 88 89 82  Temp: 98.6 F (37 C) 97.7 F (36.5 C) 98.9 F (37.2 C) 98 F (36.7 C)  TempSrc:  Oral Oral Oral  Resp: 18 18 18 16   Height:      Weight:      SpO2: 98% 99% 99% 99%   Weight change:   Intake/Output Summary (Last 24 hours) at 04/21/13 1424 Last data filed at 04/21/13 1300  Gross per 24 hour  Intake    820 ml  Output      0 ml  Net    820 ml   General: alert, cooperative, and in no apparent distress HEENT: pupils equal round and reactive to light, vision grossly intact, oropharynx clear and non-erythematous  Neck: supple, no lymphadenopathy Lungs: clear to ascultation bilaterally, normal work of respiration, no wheezes, rales, ronchi Heart: regular rate and rhythm, grade III/VI early systolic murmur best heard at the left upper sternal border, no gallops or rubs Abdomen: soft, tender to RLQ and LLQ palpation, non-distended, +bs Extremities: no cyanosis, clubbing, or edema Neurologic: alert & oriented X3, cranial nerves II-XII intact, strength grossly intact, sensation intact to light touch  Lab Results: Basic Metabolic Panel:  Recent Labs Lab 2013-04-26 1047 04/21/13 0530  NA 139 144  K 2.9* 3.3*  CL 103 109  CO2 26 26  GLUCOSE 166* 132*  BUN 9 6  CREATININE 0.96 0.83  CALCIUM 8.1* 8.4  MG  --  2.0   Liver Function Tests:  Recent Labs Lab 04-26-2013 1047  AST 101*  ALT 24  ALKPHOS 71  BILITOT 0.3  PROT 6.5  ALBUMIN 2.9*    Recent Labs Lab 04/26/2013 1047  LIPASE 20   CBC:  Recent Labs Lab 26-Apr-2013 1047 04/21/13 0530  WBC 17.4* 19.5*  NEUTROABS 11.4*  --   HGB 12.5* 11.7*  HCT 36.1* 34.9*    MCV 82.2 83.3  PLT 283 300   Urine Drug Screen: Drugs of Abuse     Component Value Date/Time   LABOPIA POSITIVE* 2013-04-26 1703   COCAINSCRNUR POSITIVE* 04/26/13 1703   LABBENZ NONE DETECTED 04-26-13 1703   AMPHETMU NONE DETECTED 04-26-13 1703   THCU POSITIVE* April 26, 2013 1703   LABBARB NONE DETECTED 04/26/13 1703     Studies/Results: Dg Abd 2 Views  04/26/13   *RADIOLOGY REPORT*  Clinical Data: Abdominal pain  ABDOMEN - 2 VIEW  Comparison: 12/01/2011  Findings: The bowel gas pattern is nonobstructed.  Gas and stool noted throughout the colon up to the rectum.  No abnormal abdominal or pelvic calcifications.  IMPRESSION:  1.  Nonobstructive bowel gas pattern.   Original Report Authenticated By: [name], M.D.   Medications: I have reviewed the patient's current medications. Scheduled Meds: . Mesalamine  1,600 mg Oral TID  . predniSONE  20 mg Oral BID WC   Continuous Infusions: . sodium chloride 75 mL/hr at 04/21/13 0126   PRN Meds:.HYDROcodone-acetaminophen, LORazepam, ondansetron (ZOFRAN) IV, ondansetron Assessment/Plan: [name] is a 32 y/o man with history of ulcerative colitis (diagnosed in 2006 s/p flex sig in 2/13 which showed severe left sided UC) and paranoid schizophrenia who presents with an acute ulcerative colitis  exacerbation.   1. Ulcerative colitis- Diagnosed in 2006, 4 flares with most recent in 2013. Flex sig in 2/13 showed severe left sided UC. Pt reports bloody diarrhea, up to 8 episodes/day, with associated abdominal pain for the past week. No fever, nausea/vomiting. FOBT positive in ED. Received one dose of solumedrol 125 mg IV in ED.  -GI following, appreciate recs  -mesalamine 1.6 g TID -change prednisone to 20 mg BID per GI recs -hydrocodone-acetominophen for pain  -will add prn toradol for pain per patient request, with protonix for GI prophylaxis -CBC tomorrow  -regular diet  -Zofran prn for nausea  -will send for c diff to rule out  superimposed c diff  2. Hypokalemia- The patient presented with hypokalemia, likely due to diarrhea. Mg wnl. -repleted K today -follow up BMP tomorrow morning  3. Schizophrenia, paranoid- Pt previously seen at [facility], prescribed risperidone in past but switched to Geodon and Ativan in 1/14 due to concerns about male breast development. However, pt still believes he most recently took Risperidone.  Patient is requesting Zyprexa.  As it is unclear what regimen would be best for this patient, we have asked for a psychiatry consult today to address this issue. -psych consulted, appreciate recs   4. DVT PPX- SCDs  Dispo: Disposition is deferred at this time, awaiting improvement of current medical problems.  Anticipated discharge in approximately 1-2 day(s).   .Services Needed at time of discharge: Y = Yes, Blank = No PT:   OT:   RN:   Equipment:   Other:     LOS: 1 day   Hester Mates, MD 04/21/2013, 2:24 PM

## 2013-04-21 NOTE — Progress Notes (Signed)
Stool specimen sent to lab for R/O CDiff

## 2013-04-21 NOTE — Treatment Plan (Signed)
Pt's record has been reviewed by Dr. Dwyane Dee at [facility] and she recommends that once pt is ready for discharge he be instructed to report to [facility] walk-in clinic for medication management.

## 2013-04-21 NOTE — Consult Note (Signed)
[facility] Consult: 11:49 AM 04/21/2013   Referring Provider: Dr Stann Mainland.  Primary Care Physician:  Medicaid card lists [facility] on [address].  Has never established care there.  Primary Gastroenterologist:  None, unassigned.  Seen by [name] and [name]/[name] in past.   Reason for Consultation:  Ulcerative colitis flare.   HPI: [name] is a 32 y.o. male.  Hx ulcerative colitis.  Diagnosed in 2006 with pan colitis on colonoscopy.  s/p flex sig with Dr. Benson Brennan in 2/13 which showed severe left sided UC.  He has history of depression, schizophrenia, and  polysubstance abuse. Current tox screen + for cocaine, opiates, THC. He says he rarely uses ilicit drugs and drinks on occasion.    Admitted today with one week bloody diarrhea, up to 8 times daily, abdominal pain in lower abdomen.  sxs are c/w with previous UC flares. He started having loose stools several weeks previously.  6 bloody stools so far today.  When doing well has had single formed BM daily Never followed up with Dr [name], [name].  Has not et KUB with non-obstructed BGP.   Not using anti-diarrheals or NSAIDs.  Cramping abd pain is better after Morphine, hydrocodone.   He has not been on any maintenance meds for his UC.   Asacol restarted at 1.2 grams daily.  Dr [name] had advised 4.8 grams daily in 2013.  Got one dose of solumedrol 125 mg, but now on Prednisone 50 mg daily.   No nausea, tolerating solids.        Past Medical History  Diagnosis Date  . Ulcerative colitis   . Schizophrenia     Past Surgical History  Procedure Laterality Date  . Mandible fracture surgery    . Flexible sigmoidoscopy  12/05/2011    Procedure: FLEXIBLE SIGMOIDOSCOPY;  Surgeon: Keith Beams, MD;  Location: [facility] ENDOSCOPY;  Service: Endoscopy;  Laterality: N/A;    Prior to Admission medications   Ran out of Xyprexa about 2 weeks ago.     Scheduled Meds: . Mesalamine   400 mg Oral TID  . pantoprazole  40 mg Oral Daily  . potassium chloride  40 mEq Oral Once  . predniSONE  50 mg Oral Q breakfast   Infusions: . sodium chloride 75 mL/hr at 04/21/13 0126   PRN Meds: HYDROcodone-acetaminophen, ketorolac, LORazepam, ondansetron (ZOFRAN) IV, ondansetron   Allergies as of 04/20/2013 - Review Complete 04/20/2013  Allergen Reaction Noted  . Depakote (divalproex sodium) Other (See Comments) 06/17/2012  . Lithium Other (See Comments) 10/21/2012  . Nsaids Other (See Comments) 10/21/2012    No family history on file.  History   Social History  . Marital Status: Single    Spouse Name: N/A    Number of Children: N/A  . Years of Education: GED   Occupational History  . disabled    Social History Main Topics  . Smoking status: Current Every Day Smoker -- 0.25 packs/day for 20 years    Types: Cigarettes  . Smokeless tobacco: Not on file  . Alcohol Use: Yes     Comment: occasional  . Drug Use: Yes    Special: Cocaine     Comment: pt states he rarely uses cocaine  . Sexually Active: Not on file   Other Topics Concern  . Not on file   Social History Narrative   [name] was recently released from prison, currently living at home with his mother.  He is disabled (due to mental illness?).  REVIEW OF SYSTEMS: Per HPI No blurry vision.  No problems reading. Knee pain Bil No joint swelling No sores on skin No bloody urine or dysuria Weight stable.   No transfusions.   Sleeps ok, no hallucinations.    PHYSICAL EXAM: Vital signs in last 24 hours: Temp:  [97.7 F (36.5 C)-98.9 F (37.2 C)] 98.9 F (37.2 C) (07/07 4098) Pulse Rate:  [81-92] 89 (07/07 0638) Resp:  [16-22] 18 (07/07 0638) BP: (101-122)/(58-69) 110/69 mmHg (07/07 0638) SpO2:  [95 %-99 %] 99 % (07/07 0638) Weight:  [82.555 kg (182 lb)] 82.555 kg (182 lb) (07/06 1615)  General: pleasant non-ill appearing AAM.  Head:  No asymmetry or swelling  Eyes:  No icterus or  pallor Ears:  Not HOH  Nose:  No congestion Mouth:  Clear oral MM Neck:  No JVD or mass Lungs:  Clear bil  No dyspnea Heart: RRR Abdomen:  Soft, active BS, ND.  Slight tenderness in LLQ.  No mass or HSM.   Rectal: deferred.   Musc/Skeltl: no joint deformity or swelling Extremities:  No pedal edema  Neurologic:  No tremor, oriented x 3.  Speech is rapid, diction is poor:  He is hard to understand.  Moves all 4 limbs, no gross deficits Skin:  No sores Tattoos:  None seen Nodes:  No inguinal adenopathy.    Psych:  Cooperative, not agitated.  Intake/Output from previous day:   Intake/Output this shift:    LAB RESULTS:  Recent Labs  04/20/13 1047 04/21/13 0530  WBC 17.4* 19.5*  HGB 12.5* 11.7*  HCT 36.1* 34.9*  PLT 283 300   BMET Lab Results  Component Value Date   NA 144 04/21/2013   NA 139 04/20/2013   NA 137 01/05/2013   K 3.3* 04/21/2013   K 2.9* 04/20/2013   K 3.3* 01/05/2013   CL 109 04/21/2013   CL 103 04/20/2013   CL 101 01/05/2013   CO2 26 04/21/2013   CO2 26 04/20/2013   CO2 26 01/05/2013   GLUCOSE 132* 04/21/2013   GLUCOSE 166* 04/20/2013   GLUCOSE 125* 01/05/2013   BUN 6 04/21/2013   BUN 9 04/20/2013   BUN 11 01/05/2013   CREATININE 0.83 04/21/2013   CREATININE 0.96 04/20/2013   CREATININE 0.75 01/05/2013   CALCIUM 8.4 04/21/2013   CALCIUM 8.1* 04/20/2013   CALCIUM 8.9 01/05/2013   LFT  Recent Labs  04/20/13 1047  PROT 6.5  ALBUMIN 2.9*  AST 101*  ALT 24  ALKPHOS 71  BILITOT 0.3   PT/INR Lab Results  Component Value Date   INR 1.32 12/17/2009   INR 1.3 12/20/2007   INR 1.3 06/17/2007   Hepatitis Panel 12/14/2011 was neg for Hep B surface Ag, Hep B surface Ab, HCV and HIV  C-Diff No components found with this basename: cdiff    Drugs of Abuse     Component Value Date/Time   LABOPIA POSITIVE* 04/20/2013 1703   COCAINSCRNUR POSITIVE* 04/20/2013 1703   LABBENZ NONE DETECTED 04/20/2013 1703   AMPHETMU NONE DETECTED 04/20/2013 1703   THCU POSITIVE* 04/20/2013 1703   LABBARB  NONE DETECTED 04/20/2013 1703     RADIOLOGY STUDIES: Dg Abd 2 Views 04/20/2013   *RADIOLOGY REPORT*  Clinical Data: Abdominal pain  ABDOMEN - 2 VIEW  Comparison: 12/01/2011  Findings: The bowel gas pattern is nonobstructed.  Gas and stool noted throughout the colon up to the rectum.  No abnormal abdominal or pelvic calcifications.  IMPRESSION:  1.  Nonobstructive bowel  gas pattern.   Original Report Authenticated By: Kerby Moors, M.D.    ENDOSCOPIC STUDIES: 11/2011   Flex sig  Dr [name] Severe colitis from dentate line to splenic flexure, normal proximal to sf.  Hemorrhoids.  Plan:  Asacol 4.8 grams daily and prednisone.   2006 Colonoscopy Dr Vladimir Faster universal ulcerative colitis   IMPRESSION: *  Ulcerative colitis,flare. Non-compliant with meds. Rule out superimposed C diff.  *  Schizophrenia.  Ran out of Xyprexa.  *  Substance abuse, episodic by his admission.    PLAN: *  C diff assay *  Up the Asacol to 4.8 grams daily.  He should be on prednisone 66m a day(20bid) and I will change his dose. HE should not taper that dose until he sees me in the office in 4-5 weeks.  Will likely continue the asacol indefinitely. *  Before he leaves hospital, have him set up with appointment with his primary care medical clinic.  We will set up return office visit appt with me in 4-5 weeks. He has seen many, many GI docs in past several years but has never established with any single provider.    LOS: 1 day   SAzucena Freed 04/21/2013, 11:49 AM Pager: [phone]

## 2013-04-21 NOTE — H&P (Signed)
I saw and evaluated the patient. I reviewed the resident's note and confirmed the resident's findings.  I agree with the assessment and plan as documented in the resident's note.  Briefly, [name] has a history of left sided ulcerative colitis diagnosed in 2006 with 4 subsequent flares, the last being in February 2013.  One week ago, he noted crampy abdominal pain and bloody bowel movements up to 8 X's/day.  He denies fevers, shakes, nausea, vomiting, or shortness of breath. He has not seen his gastroenterologist (Dr. Collene Mares) in over a year and has not been on any therapy for his UC recently.  He is admitted with a UC flare for further care.  I agree with the prednisone for acute control of symptoms and reinstitution of mesalamine for longer term control.  We will also ask Psychiatry to assess his schizophrenia regimen while here as he seems to have been lost to follow-up with his Psychiatrist and is having some issues with his medications.

## 2013-04-22 ENCOUNTER — Encounter: Payer: Self-pay | Admitting: Gastroenterology

## 2013-04-22 DIAGNOSIS — R1084 Generalized abdominal pain: Secondary | ICD-10-CM

## 2013-04-22 LAB — CBC
HCT: 33 % — ABNORMAL LOW (ref 39.0–52.0)
MCH: 28.1 pg (ref 26.0–34.0)
MCHC: 33.3 g/dL (ref 30.0–36.0)
RDW: 15.7 % — ABNORMAL HIGH (ref 11.5–15.5)

## 2013-04-22 LAB — BASIC METABOLIC PANEL
BUN: 7 mg/dL (ref 6–23)
Calcium: 8.5 mg/dL (ref 8.4–10.5)
GFR calc Af Amer: >90 mL/min (ref >90)
GFR calc non Af Amer: >90 mL/min (ref >90)
Potassium: 3.4 mEq/L — ABNORMAL LOW (ref 3.5–5.1)
Sodium: 143 mEq/L (ref 135–145)

## 2013-04-22 MED ORDER — POTASSIUM CHLORIDE CRYS ER 20 MEQ PO TBCR
40.0000 meq | EXTENDED_RELEASE_TABLET | Freq: Once | ORAL | Status: AC
  Administered 2013-04-22: 40 meq via ORAL
  Filled 2013-04-22: qty 1

## 2013-04-22 NOTE — Progress Notes (Signed)
Tried to see the patient in his room.  His told that patient went out from his room.  Patient is not available for evaluation.

## 2013-04-22 NOTE — Progress Notes (Signed)
     [facility] Daily Rounding Note 04/22/2013, 9:35 AM  SUBJECTIVE:       10 or so bloody stools yest, 2 so far today.  Abdominal  Pain persists, requiring total of 10 mg vicodin yesterday, none yet today.   OBJECTIVE:         Vital signs in last 24 hours:    Temp:  [98 F (36.7 C)-98.3 F (36.8 C)] 98.2 F (36.8 C) (07/08 0628) Pulse Rate:  [71-82] 71 (07/08 0628) Resp:  [16-18] 18 (07/08 0628) BP: (107-113)/(56-64) 112/64 mmHg (07/08 0628) SpO2:  [99 %] 99 % (07/08 0628) Last BM Date: 04/21/13 General: looks well, comfortable   Heart: RRR Chest: clear Bil Abdomen: soft, ND, active BS.  Tender mostly on LLQ  Extremities: no CCE Neuro/Psych:  Cooperative, no confusion, appropriate.   Intake/Output from previous day: 07/07 0701 - 07/08 0700 In: 1710 [P.O.:1060; I.V.:650] Out: -   Intake/Output this shift:    Lab Results:  Recent Labs  04/20/13 1047 04/21/13 0530  WBC 17.4* 19.5*  HGB 12.5* 11.7*  HCT 36.1* 34.9*  PLT 283 300   BMET  Recent Labs  04/20/13 1047 04/21/13 0530  NA 139 144  K 2.9* 3.3*  CL 103 109  CO2 26 26  GLUCOSE 166* 132*  BUN 9 6  CREATININE 0.96 0.83  CALCIUM 8.1* 8.4   LFT  Recent Labs  04/20/13 1047  PROT 6.5  ALBUMIN 2.9*  AST 101*  ALT 24  ALKPHOS 71  BILITOT 0.3    ASSESMENT: *  Crohn's colitis.  C diff is negative. Overall improved but pain and bloody stools persist.  *  Hyperglycemia./glucose intolerance in setting of Prednisone therapy.  *  Schizophrenia *  Non compliance with medical and psych follow up (at [facility] walk-in clinic).   *  Hypokalemia, improved but not yet resolved.  *  Elevated AST.  Negative hepatitis serologies last year.   *  Substance abuse.    PLAN: *  ROV 8/6 at 10 AM with Dr Ardis Hughs.  Pt made aware *  Will discharging MD please arrange for an office visit with PMD at [facility] on [facility] strett # [phone]? *  Cmet in AM.     LOS: 2 days   Azucena Freed  04/22/2013, 9:35 AM Pager: [phone]  ________________________________________________________________________  [facility] MD note:  I personally examined the patient, reviewed the data and agree with the assessment and plan described above.  I reviewed internal medicine note as well.  There seems to be disconnect between what he reports in terms of diarrhea and what RNs are actually seeing.  For now continue current plan.  I will ask RN to impress upon him to show ALL BMs, stools to the staff for recording officially.   Owens Loffler, MD [facility] Pager [phone]

## 2013-04-22 NOTE — Progress Notes (Signed)
I have seen the patient and reviewed the excellent daily progress note by [name] and discussed the care of the patient with them.  See below for documentation of my findings, assessment, and plans.  Subjective: The patient continues to note abdominal pain and bloody bowel movements.  He repots 8 bloody bowel movements in the last 24 hours.  C diff negative.  Objective: Vital signs in last 24 hours: Filed Vitals:   04/21/13 0638 04/21/13 1315 04/21/13 2153 04/22/13 0628  BP: 110/69 113/62 107/56 112/64  Pulse: 89 82 82 71  Temp: 98.9 F (37.2 C) 98 F (36.7 C) 98.3 F (36.8 C) 98.2 F (36.8 C)  TempSrc: Oral Oral Oral Oral  Resp: 18 16 18 18   Height:      Weight:      SpO2: 99% 99% 99% 99%   Weight change:   Intake/Output Summary (Last 24 hours) at 04/22/13 1306 Last data filed at 04/22/13 0830  Gross per 24 hour  Intake   1370 ml  Output      0 ml  Net   1370 ml   General: alert, cooperative, and in no apparent distress HEENT: pupils equal round and reactive to light, vision grossly intact, oropharynx clear and non-erythematous  Neck: supple, no lymphadenopathy Lungs: clear to ascultation bilaterally, normal work of respiration, no wheezes, rales, ronchi Heart: regular rate and rhythm, grade III/VI early systolic murmur best heard at the left upper sternal border, no gallops or rubs Abdomen: soft, tender to RLQ and LLQ palpation, non-distended, +bs  Extremities: no cyanosis, clubbing, or edema Neurologic: alert & oriented X3, cranial nerves II-XII intact, strength grossly intact, sensation intact to light touch  Lab Results: Reviewed and documented in Electronic Record Micro Results: Reviewed and documented in Electronic Record Studies/Results: Reviewed and documented in Electronic Record Medications: I have reviewed the patient's current medications. Scheduled Meds: . Mesalamine  1,600 mg Oral TID  . predniSONE  20 mg Oral BID WC   Continuous  Infusions:  PRN Meds:.HYDROcodone-acetaminophen, LORazepam, ondansetron (ZOFRAN) IV, ondansetron Assessment/Plan: [name] is a 32 y/o man with history of ulcerative colitis (diagnosed in 2006 s/p flex sig in 2/13 which showed severe left sided UC) and paranoid schizophrenia who presents with an acute ulcerative colitis exacerbation.   1. Ulcerative colitis- Diagnosed in 2006, 4 flares with most recent in 2013. Flex sig in 2/13 showed severe left sided UC. Pt reports bloody diarrhea, up to 8 episodes/day, with associated abdominal pain for the past week. No fever, nausea/vomiting. FOBT positive in ED. Received one dose of solumedrol 125 mg IV in ED.  -GI following, appreciate recs  -mesalamine 1.6 g TID  -continue prednisone 20 mg BID per GI recs  -hydrocodone-acetominophen for pain  -CBC tomorrow  -regular diet  -Zofran prn for nausea  -c diff negative  2. Hypokalemia- The patient presented with hypokalemia, likely due to diarrhea. Mg wnl.  -repleted K today  -follow up BMP tomorrow morning   3. Schizophrenia, paranoid- Pt previously seen at [facility], prescribed risperidone in past but switched to Geodon and Ativan in 1/14 due to concerns about male breast development. However, pt still believes he most recently took Risperidone. Patient is requesting Zyprexa.  -plan for follow-up with [facility] outpatient  4. DVT PPX- SCDs   Dispo: Disposition is deferred at this time, awaiting improvement of current medical problems. Anticipated discharge in approximately 1-2 day(s).   .Services Needed at time of discharge: Y = Yes, Blank = No  PT:   OT:   RN:   Equipment:   Other:     LOS: 2 days   Hester Mates, MD 04/22/2013, 1:06 PM

## 2013-04-22 NOTE — Progress Notes (Signed)
Internal Medicine Attending  Date: 04/22/2013  Patient name: [name] Medical record number: [id number] Date of birth: Oct 13, 1981 Age: 32 y.o. Gender: male  I saw and evaluated the patient. I reviewed the resident's note by Dr. Owens Shark and I agree with the resident's findings and plans as documented in his progress note.  We are continuing the steroids and mesalamine at the recommended dose.  We are trying to get a more accurate count of the number of bloody bowel movements.  If this is improving we anticipate he will be clinically stable for discharge home.

## 2013-04-22 NOTE — Progress Notes (Signed)
Patient instructed to notify RN of each BM and to save for RN to see. Verbalizes understanding.

## 2013-04-22 NOTE — Progress Notes (Signed)
Subjective: Reported having 16 episodes of bloody diarrhea in the last 24 hours. Denies dizziness or lightheadedness. Reports good appetite, no nausea/vomiting, pain all over abdomen a little better than yesterday.  Per nursing, only had 1 episode overnight and 1 yesterday for which C.dif specimen was sent.   Objective: Vital signs in last 24 hours: Filed Vitals:   04/21/13 0638 04/21/13 1315 04/21/13 2153 04/22/13 0628  BP: 110/69 113/62 107/56 112/64  Pulse: 89 82 82 71  Temp: 98.9 F (37.2 C) 98 F (36.7 C) 98.3 F (36.8 C) 98.2 F (36.8 C)  TempSrc: Oral Oral Oral Oral  Resp: 18 16 18 18   Height:      Weight:      SpO2: 99% 99% 99% 99%    Intake/Output Summary (Last 24 hours) at 04/22/13 1041 Last data filed at 04/21/13 1800  Gross per 24 hour  Intake   1130 ml  Output      0 ml  Net   1130 ml   General: alert, cooperative, and in no apparent distress Lungs: clear to ascultation bilaterally, normal work of respiration, no wheezes, rales, ronchi Heart: regular rate and rhythm, early systolic murmur heard at left upper sternal border, no gallops or rubs Abdomen: soft, non-distended, normoactive bowel sounds, tender to palpation in RUQ and LUQ > LLQ Extremities: 2+ DP/PT pulses bilaterally, no cyanosis, clubbing, or edema Neurologic: alert & oriented X3  Lab Results: CBC    Component Value Date/Time   WBC 15.7* 04/22/2013 1041   RBC 3.91* 04/22/2013 1041   HGB 11.0* 04/22/2013 1041   HCT 33.0* 04/22/2013 1041   PLT 323 04/22/2013 1041   MCV 84.4 04/22/2013 1041   MCH 28.1 04/22/2013 1041   MCHC 33.3 04/22/2013 1041   RDW 15.7* 04/22/2013 1041   LYMPHSABS 1.9 04/20/2013 1047   MONOABS 1.7* 04/20/2013 1047   EOSABS 2.4* 04/20/2013 1047   BASOSABS 0.0 04/20/2013 1047    Micro Results: Recent Results (from the past 240 hour(s))  CLOSTRIDIUM DIFFICILE BY PCR     Status: None   Collection Time    04/21/13  4:09 PM      Result Value Range Status   C difficile by pcr NEGATIVE  NEGATIVE  Final   Medications: I have reviewed the patient's current medications. Scheduled Meds: . Mesalamine  1,600 mg Oral TID  . predniSONE  20 mg Oral BID WC   Continuous Infusions: none PRN Meds:.HYDROcodone-acetaminophen, LORazepam, ondansetron (ZOFRAN) IV, ondansetron  Assessment/Plan: [name] is a 32 year old male with ulcerative colitis and paranoid schizophrenia who presented with both likely an acute exacerbation of his ulcerative colitis and hypokalemia.  1. Ulcerative colitis: Diagnosed in 2006, 4 flares with most recent in 2013. Flex sig in 2/13 showed severe left sided UC. Pt reports bloody diarrhea, up to 8 episodes/day, with associated abdominal pain for the past week. No fever, nausea/vomiting. FOBT positive in ED. Received one dose of solumedrol 125 mg IV in ED.  -Mesalamine 1.6 g TID  -Per GI, discontinued Protonix for GI prophylaxis against Tordol since no longer needed. Prednisone now 20 mg BID. -C. Dif PCR negative -CBC today vs. yesterday: WBC 15.7 vs. 19.5, Hb 11.0 vs. 11.7, Hct 34.9 vs. 33 -Tolerating regular diet and ambulating -Zofran prn for nausea  -Hydrocodone-acetominophen prn for pain   2. Hypokalemia: Likely secondary to diarrhea; Mg within normal limits. -Awaiting AM BMP to reassess K and will replete as necessary.  3. Schizophrenia, paranoid subtype: Pt previously seen at  [facility], prescribed risperidone in past but switched to Geodon and Ativan in 1/14 due to concerns about male breast development.  -Pt still believes he most recently took Risperidone and was requesting Zyprexa yesterday.  -Awaiting psych consult   4. DVT PPX: SCDs   Dispo: Improvement in WBC and symptoms but will reassess later today given Hb drop to decide of discharge today.    -Per GI, will f/u with Dr. Ardis Hughs on 05/21/13 at 10am, and patient is aware.  -Per GI, will arrange for f/u visit with PCP at [facility] [phone]).   This is a  Careers information officer Note.  The care of the patient was discussed with Dr. Elnora Morrison and the assessment and plan formulated with their assistance.  Please see their attached note for official documentation of the daily encounter.   LOS: 2 days   [name], Med Student 04/22/2013, 10:41 AM

## 2013-04-23 ENCOUNTER — Inpatient Hospital Stay (HOSPITAL_COMMUNITY): Payer: Medicare Other

## 2013-04-23 ENCOUNTER — Encounter (HOSPITAL_COMMUNITY): Payer: Self-pay | Admitting: Radiology

## 2013-04-23 DIAGNOSIS — E876 Hypokalemia: Secondary | ICD-10-CM

## 2013-04-23 LAB — CBC
HCT: 36.1 % — ABNORMAL LOW (ref 39.0–52.0)
Hemoglobin: 11.9 g/dL — ABNORMAL LOW (ref 13.0–17.0)
MCH: 28.1 pg (ref 26.0–34.0)
MCV: 85.3 fL (ref 78.0–100.0)
Platelets: 352 10*3/uL (ref 150–400)
RBC: 4.23 MIL/uL (ref 4.22–5.81)
WBC: 14.8 10*3/uL — ABNORMAL HIGH (ref 4.0–10.5)

## 2013-04-23 LAB — COMPREHENSIVE METABOLIC PANEL
AST: 52 U/L — ABNORMAL HIGH (ref 0–37)
Albumin: 2.5 g/dL — ABNORMAL LOW (ref 3.5–5.2)
BUN: 9 mg/dL (ref 6–23)
Chloride: 107 mEq/L (ref 96–112)
Creatinine, Ser: 0.78 mg/dL (ref 0.50–1.35)
Total Bilirubin: 0.1 mg/dL — ABNORMAL LOW (ref 0.3–1.2)
Total Protein: 6 g/dL (ref 6.0–8.3)

## 2013-04-23 MED ORDER — IOHEXOL 300 MG/ML  SOLN
25.0000 mL | INTRAMUSCULAR | Status: AC
  Administered 2013-04-23 (×2): 25 mL via ORAL

## 2013-04-23 MED ORDER — POTASSIUM CHLORIDE CRYS ER 20 MEQ PO TBCR
40.0000 meq | EXTENDED_RELEASE_TABLET | Freq: Once | ORAL | Status: AC
  Administered 2013-04-23: 40 meq via ORAL
  Filled 2013-04-23: qty 2

## 2013-04-23 MED ORDER — MESALAMINE 1000 MG RE SUPP
1000.0000 mg | Freq: Two times a day (BID) | RECTAL | Status: DC
  Administered 2013-04-23 – 2013-04-26 (×8): 1000 mg via RECTAL
  Filled 2013-04-23 (×10): qty 1

## 2013-04-23 MED ORDER — POTASSIUM CHLORIDE CRYS ER 20 MEQ PO TBCR
40.0000 meq | EXTENDED_RELEASE_TABLET | Freq: Once | ORAL | Status: AC
  Administered 2013-04-23: 40 meq via ORAL
  Filled 2013-04-23 (×2): qty 1

## 2013-04-23 MED ORDER — METHYLPREDNISOLONE SODIUM SUCC 40 MG IJ SOLR
40.0000 mg | Freq: Two times a day (BID) | INTRAMUSCULAR | Status: DC
  Administered 2013-04-23 – 2013-04-26 (×7): 40 mg via INTRAVENOUS
  Filled 2013-04-23 (×9): qty 1

## 2013-04-23 MED ORDER — IOHEXOL 300 MG/ML  SOLN
100.0000 mL | Freq: Once | INTRAMUSCULAR | Status: AC | PRN
  Administered 2013-04-23: 100 mL via INTRAVENOUS

## 2013-04-23 NOTE — Progress Notes (Signed)
Internal Medicine Attending Progress Note Date: 04/23/2013  Patient name: [name] Medical record number: [id number] Date of birth: [birth year] Age: 32 y.o. Gender: male  I saw and evaluated the patient. I reviewed the student's note and I agree with the student's findings and plan as documented in his progress note.  S:  [name] still notes abdominal pain which is diffuse and unchanged.  [name] states that [name] has had 10 bloody bowel movements in the last 24 hours and this was confirmed with his nurse.  [name] continues to eat well.  O: Physical Exam:  Filed Vitals:   04/22/13 0628 04/22/13 1415 04/22/13 2129 04/23/13 0500  BP: 112/64 130/74 132/73 108/45  Pulse: 71 101 77 84  Temp: 98.2 F (36.8 C) 97.8 F (36.6 C) 98.2 F (36.8 C) 98.1 F (36.7 C)  TempSrc: Oral Oral Oral Oral  Resp: 18 18 18 18   Height:      Weight:      SpO2: 99% 98% 100% 100%   Gen: WDWN man lying comfortably in bed in NAD Lungs: CTA anteriorly w/o wheezes, rhonchi, or rales CV: RRR with a II/VI systolic ejection murmur at the LUSB Abd: Soft, diffusely tender to palpation with active bowel sounds Ext: w/o edema  Lab results:  Basic Metabolic Panel:  Recent Labs  04/21/13 0530 04/22/13 1041 04/23/13 0503  NA 144 143 143  K 3.3* 3.4* 3.3*  CL 109 107 107  CO2 26 30 29   GLUCOSE 132* 117* 83  BUN 6 7 9   CREATININE 0.83 0.82 0.78  CALCIUM 8.4 8.5 8.2*  MG 2.0  --   --    Liver Function Tests:  Recent Labs  04/23/13 0503  AST 52*  ALT 29  ALKPHOS 61  BILITOT 0.1*  PROT 6.0  ALBUMIN 2.5*   CBC:  Recent Labs  04/22/13 1041 04/23/13 0503  WBC 15.7* 14.8*  HGB 11.0* 11.9*  HCT 33.0* 36.1*  MCV 84.4 85.3  PLT 323 352   Assessment & Plan by Problem:  [name] is a 32 y.o. man with ulcerative colitis and schizophrenia who is admitted with an ulcerative colitis flare.  [name] has been treated with mesalamine and prednisone w/o much change in his symptoms of bloody bowel movements and  abdominal pain.  1) Ulcerative colitis:  Appreciate GI recommendations that include converting to solumedrol 40 mg IV Q12H and restaging his disease with CT of abdomen/pelvis.  We will follow these recommendations and reassess clinically.  2) Hypokalemia: We will continue to replete the potassium orally.  3) Disposition: To home once bloody bowel movements and abdominal pain under better control.

## 2013-04-23 NOTE — Progress Notes (Signed)
[facility] Progress Note    Since last GI note: Four documented bloody diarrheal stools overnight, 2 on shift just prior. He doesn't think his bowel are any better since being admitted.  Still with abd pain, most prominent in left abd.  EAting well.  Objective: Vital signs in last 24 hours: Temp:  [97.8 F (36.6 C)-98.2 F (36.8 C)] 98.1 F (36.7 C) (07/09 0500) Pulse Rate:  [77-101] 84 (07/09 0500) Resp:  [18] 18 (07/09 0500) BP: (108-132)/(45-74) 108/45 mmHg (07/09 0500) SpO2:  [98 %-100 %] 100 % (07/09 0500) Last BM Date: 04/22/13 General: alert and oriented times 3 Heart: regular rate and rythm Abdomen: soft, mildly tender left abd, non-distended, normal bowel sounds   Lab Results:  Recent Labs  04/21/13 0530 04/22/13 1041 04/23/13 0503  WBC 19.5* 15.7* 14.8*  HGB 11.7* 11.0* 11.9*  PLT 300 323 352  MCV 83.3 84.4 85.3    Recent Labs  04/21/13 0530 04/22/13 1041 04/23/13 0503  NA 144 143 143  K 3.3* 3.4* 3.3*  CL 109 107 107  CO2 26 30 29   GLUCOSE 132* 117* 83  BUN 6 7 9   CREATININE 0.83 0.82 0.78  CALCIUM 8.4 8.5 8.2*    Recent Labs  04/20/13 1047 04/23/13 0503  PROT 6.5 6.0  ALBUMIN 2.9* 2.5*  AST 101* 52*  ALT 24 29  ALKPHOS 71 61  BILITOT 0.3 0.1*   No results found for this basename: INR,  in the last 72 hours   Studies/Results: No results found.   Medications: Scheduled Meds: . Mesalamine  1,600 mg Oral TID  . potassium chloride  40 mEq Oral Once  . potassium chloride  40 mEq Oral Once  . predniSONE  20 mg Oral BID WC   Continuous Infusions:  PRN Meds:.HYDROcodone-acetaminophen, LORazepam, ondansetron (ZOFRAN) IV, ondansetron    Assessment/Plan: 32 y.o. male with bloody diarrhea, UC, abd pain  He is really not adequately responding to PO prednisone as I thought he would.  Will change to IV steroids this AM (solumedrol 40 q12).  Also will restage his disease with CT scan abd/pelvis.  Lastly, stool testing.  Can  continue solids orally.  Owens Loffler, MD  04/23/2013, 8:25 AM [facility] Pager [phone]

## 2013-04-23 NOTE — Progress Notes (Signed)
Subjective: Reported 10 episodes of bloody diarrhea since 2pm yesterday. Reports good appetite, pain all over abdomen worse than yesterday. Able to ambulate as well.   Nursing confirms diarrhea count and patient report.  Objective: Vital signs in last 24 hours: Filed Vitals:   04/22/13 0628 04/22/13 1415 04/22/13 2129 04/23/13 0500  BP: 112/64 130/74 132/73 108/45  Pulse: 71 101 77 84  Temp: 98.2 F (36.8 C) 97.8 F (36.6 C) 98.2 F (36.8 C) 98.1 F (36.7 C)  TempSrc: Oral Oral Oral Oral  Resp: 18 18 18 18   Height:      Weight:      SpO2: 99% 98% 100% 100%    Intake/Output Summary (Last 24 hours) at 04/23/13 1008 Last data filed at 04/22/13 1300  Gross per 24 hour  Intake    240 ml  Output      0 ml  Net    240 ml   General: alert, cooperative, lying in bed, in mild distress Heart: regular rate and rhythm, early systolic murmur heard at left upper sternal border, no gallops or rubs Abdomen: soft, non-distended, normoactive bowel sounds, tender to palpation in all four quadrants with left worse than right Extremities: 2+ DP/PT pulses bilaterally, no cyanosis, clubbing, or edema Neurologic: alert & oriented X3  Lab Results: BMET    Component Value Date/Time   NA 143 04/23/2013 0503   K 3.3* 04/23/2013 0503   CL 107 04/23/2013 0503   CO2 29 04/23/2013 0503   GLUCOSE 83 04/23/2013 0503   BUN 9 04/23/2013 0503   CREATININE 0.78 04/23/2013 0503   CALCIUM 8.2* 04/23/2013 0503   GFRNONAA >90 04/23/2013 0503   GFRAA >90 04/23/2013 0503     CBC    Component Value Date/Time   WBC 14.8* 04/23/2013 0503   RBC 4.23 04/23/2013 0503   HGB 11.9* 04/23/2013 0503   HCT 36.1* 04/23/2013 0503   PLT 352 04/23/2013 0503   MCV 85.3 04/23/2013 0503   MCH 28.1 04/23/2013 0503   MCHC 33.0 04/23/2013 0503   RDW 16.0* 04/23/2013 0503   LYMPHSABS 1.9 04/20/2013 1047   MONOABS 1.7* 04/20/2013 1047   EOSABS 2.4* 04/20/2013 1047   BASOSABS 0.0 04/20/2013 1047     Medications: I have reviewed the patient's current  medications. Scheduled Meds: . iohexol  25 mL Oral Q1 Hr x 2  . Mesalamine  1,600 mg Oral TID  . mesalamine  1,000 mg Rectal BID  . methylPREDNISolone (SOLU-MEDROL) injection  40 mg Intravenous Q12H  . potassium chloride  40 mEq Oral Once   Continuous Infusions: none PRN Meds:.HYDROcodone-acetaminophen, LORazepam, ondansetron (ZOFRAN) IV, ondansetron  Assessment/Plan: [name] is a 32 year old male with ulcerative colitis and paranoid schizophrenia who presented with both likely an acute exacerbation of his ulcerative colitis and hypokalemia.  1. Ulcerative colitis: Diagnosed in 2006, 4 flares with most recent in 2013. Flex sig in 2/13 showed severe left sided UC.  -Mesalamine 1.6 g TID  -Per GI, discontinued prednisone and started solumedrol 29m BID and will restage disease with CT abdomen/pelvis -Per GI, ordered parasite/ova assay and stool culture; C. Dif PCR negative 7/7 -CBC today vs. yesterday: WBC 14.8 vs. 15.7, Hb 11.9 vs. 11.0, Hct 36.1 vs. 33 -Tolerating regular diet and ambulating -Zofran prn for nausea  -Hydrocodone-acetominophen prn for pain   2. Hypokalemia: Likely secondary to diarrhea; Mg within normal limits. -K 3.3 this AM so scheduled KCl 460m BID  3. Schizophrenia, paranoid subtype: Pt previously seen at  [facility], prescribed risperidone in past but switched to Geodon and Ativan in 1/14 due to concerns about male breast development.  -Pt still believes he most recently took Risperidone and was requesting Zyprexa at one point.  -Currently on Ativan 65m q6h PRN for anxiety -Psych could not evaluate patient yesterday; will see if they plan to see again today    4. DVT PPX: SCDs   Dispo: Improvement in WBC and Hb and worsening symptoms warrants continued monitoring for improvement and resolution -Per GI, will f/u with Dr. JArdis Hughson 05/21/13 at 10am, and patient is aware.  -[facility]([phone]  on 05/20/13 at 3:45pm   This is a MCareers information officerNote.  The care of the patient was discussed with Dr. RElnora Morrisonand the assessment and plan formulated with their assistance.  Please see their attached note for official documentation of the daily encounter.   LOS: 3 days   RRiccardo Brennan Med Student 04/23/2013, 10:08 AM

## 2013-04-24 DIAGNOSIS — F200 Paranoid schizophrenia: Secondary | ICD-10-CM

## 2013-04-24 LAB — BASIC METABOLIC PANEL
BUN: 9 mg/dL (ref 6–23)
CO2: 28 mEq/L (ref 19–32)
Calcium: 8.5 mg/dL (ref 8.4–10.5)
Creatinine, Ser: 0.73 mg/dL (ref 0.50–1.35)
GFR calc non Af Amer: >90 mL/min (ref >90)
Glucose, Bld: 183 mg/dL — ABNORMAL HIGH (ref 70–99)
Sodium: 140 mEq/L (ref 135–145)

## 2013-04-24 LAB — CBC
MCH: 28.5 pg (ref 26.0–34.0)
MCHC: 33.8 g/dL (ref 30.0–36.0)
MCV: 84.2 fL (ref 78.0–100.0)
Platelets: 385 10*3/uL (ref 150–400)
RBC: 4.04 MIL/uL — ABNORMAL LOW (ref 4.22–5.81)
RDW: 15.8 % — ABNORMAL HIGH (ref 11.5–15.5)

## 2013-04-24 LAB — OVA AND PARASITE EXAMINATION

## 2013-04-24 MED ORDER — POTASSIUM CHLORIDE CRYS ER 20 MEQ PO TBCR
40.0000 meq | EXTENDED_RELEASE_TABLET | Freq: Two times a day (BID) | ORAL | Status: DC
  Administered 2013-04-24 – 2013-04-27 (×7): 40 meq via ORAL
  Filled 2013-04-24 (×9): qty 2

## 2013-04-24 NOTE — Progress Notes (Signed)
Subjective: Per patient, 8 counts of bloody diarrhea since yesterday AM but notes less blood. Pain same as yesterday. Denies dizziness/lightheadedness, change in appetite.  Objective: Vital signs in last 24 hours: Filed Vitals:   04/22/13 2129 04/23/13 0500 04/23/13 2225 04/24/13 0557  BP: 132/73 108/45 118/68 116/66  Pulse: 77 84 72 77  Temp: 98.2 F (36.8 C) 98.1 F (36.7 C) 98.3 F (36.8 C) 98.2 F (36.8 C)  TempSrc: Oral Oral Oral Oral  Resp: 18 18 20 18   Height:      Weight:      SpO2: 100% 100% 100% 100%    Intake/Output Summary (Last 24 hours) at 04/24/13 0810 Last data filed at 04/23/13 1830  Gross per 24 hour  Intake   1370 ml  Output      0 ml  Net   1370 ml   Physical Exam: General: alert, cooperative, sitting upright in no acute distress Heart: regular rate and rhythm, early systolic murmur heard at left upper sternal border, no gallops or rubs Lungs: clear to auscultation bilaterally without rales, rhonchi, or wheezes Abdomen: soft, non-distended, normoactive bowel sounds, tender to palpation in all four quadrants with left worse than right, mid-section worse than peripheral Extremities: 2+ DP/PT pulses bilaterally, no cyanosis, clubbing, or edema Neurologic: alert & oriented X3  Lab Results: BMET    Component Value Date/Time   NA 143 04/23/2013 0503   K 3.3* 04/23/2013 0503   CL 107 04/23/2013 0503   CO2 29 04/23/2013 0503   GLUCOSE 83 04/23/2013 0503   BUN 9 04/23/2013 0503   CREATININE 0.78 04/23/2013 0503   CALCIUM 8.2* 04/23/2013 0503   GFRNONAA >90 04/23/2013 0503   GFRAA >90 04/23/2013 0503     CBC    Component Value Date/Time   WBC 14.8* 04/23/2013 0503   RBC 4.23 04/23/2013 0503   HGB 11.9* 04/23/2013 0503   HCT 36.1* 04/23/2013 0503   PLT 352 04/23/2013 0503   MCV 85.3 04/23/2013 0503   MCH 28.1 04/23/2013 0503   MCHC 33.0 04/23/2013 0503   RDW 16.0* 04/23/2013 0503   LYMPHSABS 1.9 04/20/2013 1047   MONOABS 1.7* 04/20/2013 1047   EOSABS 2.4* 04/20/2013 1047   BASOSABS 0.0 04/20/2013 1047   CT Abdomen Pelvis w/ Contrast (04/23/13): IMPRESSION:   1. Moderate left-sided colitis involving the descending and sigmoid colon, which is increased since previous study. Differential diagnosis includes ulcerative colitis and infectious colitis.   2. Stable mild lymphadenopathy in the left para-aortic region and sigmoid mesocolon. This is nonspecific, but may be reactive in etiology.    Medications: I have reviewed the patient's current medications. Scheduled Meds: . Mesalamine  1,600 mg Oral TID  . mesalamine  1,000 mg Rectal BID  . methylPREDNISolone (SOLU-MEDROL) injection  40 mg Intravenous Q12H   Continuous Infusions: none PRN Meds:.HYDROcodone-acetaminophen, LORazepam, ondansetron (ZOFRAN) IV, ondansetron  Assessment/Plan: Mr. [name] is a 32 year old male with ulcerative colitis and paranoid schizophrenia who was admitted for an acute exacerbation of ulcerative colitis and secondary hypokalemia.  1. Ulcerative colitis: GI following and appreciate their recommendations. -Parasite/ova assay and stool culture pending (ordered 7/9) -Awaiting AM CBC to check WBC, Hb/Hct -Tolerating regular diet and ambulating  2. Hypokalemia: Awaiting AM labs and will replete as needed.  3. Schizophrenia, paranoid subtype: Psych could not evaluate patient day before; will see if they plan to see again today.    4. DVT PPX: SCDs   Dispo: Will continued monitoring for improvement and resolution -  Per GI, will f/u with Dr. Ardis Hughs on 05/21/13 at [place], and patient is aware.  -Scheduled appointment with [facility] @ [facility] [phone]) on 05/20/13 at 3:45pm, and patient is aware.   This is a Careers information officer Note.  The care of the patient was discussed with Dr. Elnora Morrison and the assessment and plan formulated with their assistance.  Please see their attached note for official documentation of the daily encounter.   LOS: 4 days   Riccardo Dubin, Med Student 04/24/2013, 8:10 AM

## 2013-04-24 NOTE — Progress Notes (Signed)
  Date: 04/24/2013  Patient name: [name]  Medical record number: [id number]  Date of birth: Sep 25, 1981   This patient has been seen and the plan of care was discussed with the house staff. Please see their note for complete details. I concur with their findings.   IV steroids ongoing, continues to have large number of bloody bowel movements.  Continue mesalamine.  Day 2 of IV steroids.   Sid Falcon, MD 04/24/2013, 1:44 PM

## 2013-04-24 NOTE — Progress Notes (Signed)
[facility] Progress Note    Since last GI note: Still says 8-12 bloody bms daily and continued abd pains. CT scan yesterday showed mainly left sided disease  Objective: Vital signs in last 24 hours: Temp:  [98.2 F (36.8 C)-98.3 F (36.8 C)] 98.2 F (36.8 C) (07/10 0557) Pulse Rate:  [72-77] 77 (07/10 0557) Resp:  [18-20] 18 (07/10 0557) BP: (116-118)/(66-68) 116/66 mmHg (07/10 0557) SpO2:  [100 %] 100 % (07/10 0557) Last BM Date: 04/24/13 General: alert and oriented times 3 Heart: regular rate and rythm Abdomen: soft, non-tender, non-distended, normal bowel sounds   Lab Results:  Recent Labs  04/22/13 1041 04/23/13 0503 04/24/13 1005  WBC 15.7* 14.8* 13.7*  HGB 11.0* 11.9* 11.5*  PLT 323 352 385  MCV 84.4 85.3 84.2    Recent Labs  04/22/13 1041 04/23/13 0503 04/24/13 1005  NA 143 143 140  K 3.4* 3.3* 3.6  CL 107 107 102  CO2 30 29 28   GLUCOSE 117* 83 183*  BUN 7 9 9   CREATININE 0.82 0.78 0.73  CALCIUM 8.5 8.2* 8.5    Recent Labs  04/23/13 0503  PROT 6.0  ALBUMIN 2.5*  AST 52*  ALT 29  ALKPHOS 61  BILITOT 0.1*   No results found for this basename: INR,  in the last 72 hours   Studies/Results: Ct Abdomen Pelvis W Contrast  04/23/2013   *RADIOLOGY REPORT*  Clinical Data: Abdominal pain, left side greater than right. Bloody diarrhea.  Nausea vomiting.  Colitis.  CT ABDOMEN AND PELVIS WITH CONTRAST  Technique:  Multidetector CT imaging of the abdomen and pelvis was performed following the standard protocol during bolus administration of intravenous contrast.  Contrast: 148m OMNIPAQUE IOHEXOL 300 MG/ML  SOLN  Comparison: 12/01/2011  Findings: Moderate wall thickening is seen involving the descending and sigmoid portions of the colon which appears contiguous from the level of the splenic flexure to the rectum.  This is increased since previous study and is consistent with colitis.  There is no evidence of pneumatosis, abscess, or free fluid.   No evidence of bowel obstruction.  No evidence of bowel wall thickening involving the distal ileum or small bowel.  Normal appendix is visualized.  The liver, spleen, pancreas, gallbladder, adrenal glands, and kidneys are normal in appearance.  No evidence of hydronephrosis. Retroaortic left renal vein is incidentally noted. Mild lymphadenopathy is again seen in the in the left para-aortic region, with largest measuring 11 mm in short axis. Mild lymphadenopathy is also seen in the sigmoid mesocolon.  These are stable since prior exam.  No other soft tissue masses are identified.  No suspicious bone lesions identified.  IMPRESSION:  1. Moderate left-sided colitis involving the descending and sigmoid colon, which is increased since previous study. Differential diagnosis includes ulcerative colitis and infectious colitis. 2.  Stable mild lymphadenopathy in the left para-aortic region and sigmoid mesocolon.  This is nonspecific, but may be reactive in etiology.   Original Report Authenticated By: JEarle Gell M.D.     Medications: Scheduled Meds: . Mesalamine  1,600 mg Oral TID  . mesalamine  1,000 mg Rectal BID  . methylPREDNISolone (SOLU-MEDROL) injection  40 mg Intravenous Q12H   Continuous Infusions:  PRN Meds:.HYDROcodone-acetaminophen, LORazepam, ondansetron (ZOFRAN) IV, ondansetron    Assessment/Plan: 32y.o. male with UC flare   Today is really day #2 of iv steroids. I explained to him that we generally give IV steroids 5-7 days time to start working before considering surgery, colectomy.  Need  to continue to have all his stools documented by staff so that we get an accurate count.   Owens Loffler, MD  04/24/2013, 12:03 PM [facility] Pager [phone]

## 2013-04-24 NOTE — Progress Notes (Signed)
2 loose brown red streaked stools today, 359m total

## 2013-04-24 NOTE — Progress Notes (Signed)
I have seen the patient and reviewed the excellent daily progress note by [place] and discussed the care of the patient with them.  See below for documentation of my findings, assessment, and plans.  Subjective: The patient continues to note 8 bloody BM's per day, as well as abdominal pain.  The patient appears to be able to walk around the floor without difficulty.  CT scan yesterday confirmed left-sided colitis.  Stool O&P and culture pending.  Objective: Vital signs in last 24 hours: Filed Vitals:   04/22/13 2129 04/23/13 0500 04/23/13 2225 04/24/13 0557  BP: 132/73 108/45 118/68 116/66  Pulse: 77 84 72 77  Temp: 98.2 F (36.8 C) 98.1 F (36.7 C) 98.3 F (36.8 C) 98.2 F (36.8 C)  TempSrc: Oral Oral Oral Oral  Resp: 18 18 20 18   Height:      Weight:      SpO2: 100% 100% 100% 100%   Weight change:   Intake/Output Summary (Last 24 hours) at 04/24/13 1033 Last data filed at 04/24/13 9563  Gross per 24 hour  Intake   1730 ml  Output      0 ml  Net   1730 ml  General: alert, cooperative, and in no apparent distress HEENT: pupils equal round and reactive to light, vision grossly intact, oropharynx clear and non-erythematous  Neck: supple, no lymphadenopathy Lungs: clear to ascultation bilaterally, normal work of respiration, no wheezes, rales, ronchi Heart: regular rate and rhythm, grade III/VI early systolic murmur best heard at the left upper sternal border, no gallops or rubs Abdomen: soft, mildly tender to RLQ and LLQ palpation, non-distended, +bs  Extremities: no cyanosis, clubbing, or edema Neurologic: alert & oriented X3, cranial nerves II-XII intact, strength grossly intact, sensation intact to light touch   Lab Results: Reviewed and documented in Electronic Record Micro Results: Reviewed and documented in Electronic Record Studies/Results: Reviewed and documented in Electronic Record Medications: I have reviewed the patient's current  medications. Scheduled Meds: . Mesalamine  1,600 mg Oral TID  . mesalamine  1,000 mg Rectal BID  . methylPREDNISolone (SOLU-MEDROL) injection  40 mg Intravenous Q12H   Continuous Infusions:  PRN Meds:.HYDROcodone-acetaminophen, LORazepam, ondansetron (ZOFRAN) IV, ondansetron Assessment/Plan: [name] is a 32 y/o man with history of ulcerative colitis (diagnosed in 2006 s/p flex sig in 2/13 which showed severe left sided UC) and paranoid schizophrenia who presents with an acute ulcerative colitis exacerbation.   1. Ulcerative colitis- Diagnosed in 2006, 4 flares with most recent in 2013. Flex sig in 2/13 showed severe left sided UC. Pt reports bloody diarrhea, up to 8 episodes/day, with associated abdominal pain for the past week. No fever, nausea/vomiting. FOBT positive in ED.  Received one dose of solumedrol 125 mg IV in ED, then switched to oral prednisone 7/7, but changed back to IV solumedrol 7/9 due to lack of improvement. C diff negative -GI following, appreciate recs  -continue mesalamine 1.6 g TID  -continue solumedrol -hydrocodone-acetominophen for pain  -CBC daily -regular diet  -Zofran prn for nausea  -stool culture, O&P pending  2. Schizophrenia, paranoid- Pt previously seen at [facility], prescribed risperidone in past but switched to Geodon and Ativan in 1/14 due to concerns about male breast development. However, pt still believes he most recently took Risperidone. Patient is requesting Zyprexa.  -request psych consult to help determine an appropriate medication regimen  4. DVT PPX- SCDs   Dispo: Disposition is deferred at this time, awaiting improvement of current medical problems.  Anticipated discharge in approximately 1-2 day(s).    .Services Needed at time of discharge: Y = Yes, Blank = No PT:   OT:   RN:   Equipment:   Other:     LOS: 4 days   Keith Mates, MD 04/24/2013, 10:33 AM

## 2013-04-24 NOTE — Consult Note (Signed)
Reason for Consult: History of schizophrenia Referring Physician: Dr. Blenda Bridegroom is an 32 y.o. male.  HPI: Patient was admitted due to a flareup of his ulcerative colitis.  Psychosocial was called because patient has history of schizophrenia noncompliant with medication.  Patient seen today.  He admitted noncompliant with his Zyprexa because soft unavailability of Zyprexa.  He was given Zyprexa via [facility] however recently changes at [facility] provider his Zyprexa was stopped.  He was given Geodon and risperidone.  Patient does not like risperidone because of gynecomastia.  Patient admitted noncompliance with Zyprexa for past 3 weeks resulting increase paranoia and poor sleep.  He denies any hallucination but admitted he has been experiencing hallucination if he does not take his medication for a long time.  Patient denies any active or passive suicidal thoughts.  He appears guarded and withdrawn.  He does not talk much.  He lives with his mother.  He endorse for past few weeks socially withdrawn, isolated and does not want to talk much.  He admitted sometimes feeling paranoia that people are watching him however he understand that could be due to too noncompliance with the medication.  In the unit patient has been calm there has been no incidents of agitation aggression that requires emergency medication.  Past psychiatric history. Patient has been admitted to behavioral Center due to aggressive behavior.  He has tried multiple medication but do not limited details.  Patient endorsed history of psychosis paranoia and suicidal thinking but denies any homicidal thoughts.  Patient do not remember his previous hospitalization in detail.  Legal history. Patient has been arrested and sent to jail he did not want to talk about it.  Past Medical History  Diagnosis Date  . Ulcerative colitis   . Schizophrenia     Past Surgical History  Procedure Laterality Date  . Mandible fracture surgery    .  Flexible sigmoidoscopy  12/05/2011    Procedure: FLEXIBLE SIGMOIDOSCOPY;  Surgeon: Beryle Beams, MD;  Location: [facility] ENDOSCOPY;  Service: Endoscopy;  Laterality: N/A;    No family history on file.  Social History:  reports that he has been smoking Cigarettes.  He has a 5 pack-year smoking history. He does not have any smokeless tobacco history on file. He reports that  drinks alcohol. He reports that he uses illicit drugs (Cocaine).  Allergies:  Allergies  Allergen Reactions  . Depakote (Divalproex Sodium) Other (See Comments)    Reaction: seizures   . Lithium Other (See Comments)    Reaction: rectal bleeding  . Nsaids Other (See Comments)    Reaction: rectal bleeding    Medications: I have reviewed the patient's current medications.  Results for orders placed during the hospital encounter of 04/20/13 (from the past 48 hour(s))  COMPREHENSIVE METABOLIC PANEL     Status: Abnormal   Collection Time    04/23/13  5:03 AM      Result Value Range   Sodium 143  135 - 145 mEq/L   Potassium 3.3 (*) 3.5 - 5.1 mEq/L   Chloride 107  96 - 112 mEq/L   CO2 29  19 - 32 mEq/L   Glucose, Bld 83  70 - 99 mg/dL   BUN 9  6 - 23 mg/dL   Creatinine, Ser 0.78  0.50 - 1.35 mg/dL   Calcium 8.2 (*) 8.4 - 10.5 mg/dL   Total Protein 6.0  6.0 - 8.3 g/dL   Albumin 2.5 (*) 3.5 - 5.2 g/dL   AST  52 (*) 0 - 37 U/L   ALT 29  0 - 53 U/L   Alkaline Phosphatase 61  39 - 117 U/L   Total Bilirubin 0.1 (*) 0.3 - 1.2 mg/dL   GFR calc non Af Amer >90  >90 mL/min   GFR calc Af Amer >90  >90 mL/min   Comment:            The eGFR has been calculated     using the CKD EPI equation.     This calculation has not been     validated in all clinical     situations.     eGFR's persistently     <90 mL/min signify     possible Chronic Kidney Disease.  CBC     Status: Abnormal   Collection Time    04/23/13  5:03 AM      Result Value Range   WBC 14.8 (*) 4.0 - 10.5 K/uL   RBC 4.23  4.22 - 5.81 MIL/uL   Hemoglobin 11.9  (*) 13.0 - 17.0 g/dL   HCT 36.1 (*) 39.0 - 52.0 %   MCV 85.3  78.0 - 100.0 fL   MCH 28.1  26.0 - 34.0 pg   MCHC 33.0  30.0 - 36.0 g/dL   RDW 16.0 (*) 11.5 - 15.5 %   Platelets 352  150 - 400 K/uL  OVA AND PARASITE EXAMINATION     Status: None   Collection Time    04/23/13  5:23 PM      Result Value Range   Specimen Description STOOL     Special Requests Immunocompromised     Ova and parasites NO OVA OR PARASITES SEEN MODERATE YEAST     Report Status 04/24/2013 FINAL    BASIC METABOLIC PANEL     Status: Abnormal   Collection Time    04/24/13 10:05 AM      Result Value Range   Sodium 140  135 - 145 mEq/L   Potassium 3.6  3.5 - 5.1 mEq/L   Chloride 102  96 - 112 mEq/L   CO2 28  19 - 32 mEq/L   Glucose, Bld 183 (*) 70 - 99 mg/dL   BUN 9  6 - 23 mg/dL   Creatinine, Ser 0.73  0.50 - 1.35 mg/dL   Calcium 8.5  8.4 - 10.5 mg/dL   GFR calc non Af Amer >90  >90 mL/min   GFR calc Af Amer >90  >90 mL/min   Comment:            The eGFR has been calculated     using the CKD EPI equation.     This calculation has not been     validated in all clinical     situations.     eGFR's persistently     <90 mL/min signify     possible Chronic Kidney Disease.  CBC     Status: Abnormal   Collection Time    04/24/13 10:05 AM      Result Value Range   WBC 13.7 (*) 4.0 - 10.5 K/uL   RBC 4.04 (*) 4.22 - 5.81 MIL/uL   Hemoglobin 11.5 (*) 13.0 - 17.0 g/dL   HCT 34.0 (*) 39.0 - 52.0 %   MCV 84.2  78.0 - 100.0 fL   MCH 28.5  26.0 - 34.0 pg   MCHC 33.8  30.0 - 36.0 g/dL   RDW 15.8 (*) 11.5 - 15.5 %   Platelets 385  150 -  400 K/uL    Ct Abdomen Pelvis W Contrast  04/23/2013   *RADIOLOGY REPORT*  Clinical Data: Abdominal pain, left side greater than right. Bloody diarrhea.  Nausea vomiting.  Colitis.  CT ABDOMEN AND PELVIS WITH CONTRAST  Technique:  Multidetector CT imaging of the abdomen and pelvis was performed following the standard protocol during bolus administration of intravenous contrast.   Contrast: 160m OMNIPAQUE IOHEXOL 300 MG/ML  SOLN  Comparison: 12/01/2011  Findings: Moderate wall thickening is seen involving the descending and sigmoid portions of the colon which appears contiguous from the level of the splenic flexure to the rectum.  This is increased since previous study and is consistent with colitis.  There is no evidence of pneumatosis, abscess, or free fluid.  No evidence of bowel obstruction.  No evidence of bowel wall thickening involving the distal ileum or small bowel.  Normal appendix is visualized.  The liver, spleen, pancreas, gallbladder, adrenal glands, and kidneys are normal in appearance.  No evidence of hydronephrosis. Retroaortic left renal vein is incidentally noted. Mild lymphadenopathy is again seen in the in the left para-aortic region, with largest measuring 11 mm in short axis. Mild lymphadenopathy is also seen in the sigmoid mesocolon.  These are stable since prior exam.  No other soft tissue masses are identified.  No suspicious bone lesions identified.  IMPRESSION:  1. Moderate left-sided colitis involving the descending and sigmoid colon, which is increased since previous study. Differential diagnosis includes ulcerative colitis and infectious colitis. 2.  Stable mild lymphadenopathy in the left para-aortic region and sigmoid mesocolon.  This is nonspecific, but may be reactive in etiology.   Original Report Authenticated By: JEarle Gell M.D.    Review of Systems  Psychiatric/Behavioral: Positive for depression. Negative for suicidal ideas, hallucinations and substance abuse. The patient is nervous/anxious and has insomnia.    Blood pressure 121/66, pulse 95, temperature 97.9 F (36.6 C), temperature source Oral, resp. rate 20, height 6' 1"  (1.854 m), weight 82.555 kg (182 lb), SpO2 98.00%. Physical Exam  Mental status examination Patient is a young man who appears to be in his his stated age.  He appears guarded and withdrawn and maintained poor eye  contact.  His speech is slow monotonous but coherent.  His thought processes slow but logical and goal-directed.  There were no flight of ideas or any loose association.  He admitted paranoia but denies any active or passive suicidal thoughts or homicidal thoughts.  His psychomotor activity is slowed.  His attention and concentration is fair.  There were no tremors or shakes.  His fund of knowledge is average.  He's alert and oriented x3.  His insight judgment and impulse control is fair.  Assessment/Plan: Axis I schizophrenia chronic paranoid type Axis II deferred Axis III  Patient Active Problem List   Diagnosis Date Noted  . Abdominal pain, generalized 12/02/2011  . Nausea and vomiting 12/02/2011  . Diarrhea 12/02/2011  . Leukocytosis 12/02/2011  . Colitis 12/02/2011  . Schizophrenia   . Ulcerative colitis   . HYPOKALEMIA 12/31/2007  . SUBSTANCE ABUSE, MULTIPLE 12/31/2007  . ULCERATIVE COLITIS 12/31/2007   Axis IV mild to moderate Axis V 55-65  Plan At this time patient has been not taking his Zyprexa.  He does not remember the dose.  Recommend to start Zyprexa 5 mg at bedtime.  Patient at this time does not require inpatient psychiatric treatment however [facility]  Patient do not recall any side  effects of Zyprexa.  However if this does not contraindicated with his medical illness and medication, start 5 mg Zyprexa at bedtime.  Closely monitor side effects including EPS, metabolic syndrome and muscle stiffness.  Call psychiatry consult and liaison services if needed.  Jarrett Chicoine T. 04/24/2013, 5:47 PM

## 2013-04-25 LAB — BASIC METABOLIC PANEL
BUN: 9 mg/dL (ref 6–23)
Chloride: 102 mEq/L (ref 96–112)
GFR calc Af Amer: >90 mL/min (ref >90)
GFR calc non Af Amer: >90 mL/min (ref >90)
Potassium: 4.3 mEq/L (ref 3.5–5.1)
Sodium: 139 mEq/L (ref 135–145)

## 2013-04-25 LAB — CBC
MCHC: 33.9 g/dL (ref 30.0–36.0)
Platelets: 449 10*3/uL — ABNORMAL HIGH (ref 150–400)
RDW: 15.9 % — ABNORMAL HIGH (ref 11.5–15.5)
WBC: 14.5 10*3/uL — ABNORMAL HIGH (ref 4.0–10.5)

## 2013-04-25 MED ORDER — OLANZAPINE 5 MG PO TABS
5.0000 mg | ORAL_TABLET | Freq: Every day | ORAL | Status: DC
  Administered 2013-04-25 – 2013-04-26 (×2): 5 mg via ORAL
  Filled 2013-04-25 (×4): qty 1

## 2013-04-25 MED ORDER — HYOSCYAMINE SULFATE ER 0.375 MG PO TB12
0.3750 mg | ORAL_TABLET | Freq: Two times a day (BID) | ORAL | Status: DC
  Administered 2013-04-25 – 2013-04-26 (×4): 0.375 mg via ORAL
  Filled 2013-04-25 (×7): qty 1

## 2013-04-25 NOTE — Progress Notes (Signed)
Internal Medicine Attending  Date: 04/25/2013  Patient name: [name] Medical record number: [id number] Date of birth: 1981/01/01 Age: 32 y.o. Gender: male  I saw and evaluated the patient. I reviewed the resident's note by Dr. Owens Shark and I agree with the resident's findings and plans as documented in his progress note.  Mr. Bouffard has noted some improvement in his abdominal pain and a decrease in the number of bloody bowel movements over the last 24 hours.  Gi recommends an additional day of IV solumedrol prior to transitioning back to oral prednisone.  We will continue with the mesalamine as well.  At this rate of improvement he may be ready for discharge in 2-3 days.

## 2013-04-25 NOTE — Progress Notes (Signed)
Subjective: The patient notes only 1 bloody bowel movement so far today.  He notes his abdominal pain has decreased to a 6/10.  Hemoglobin stable today.  Objective: Vital signs in last 24 hours: Filed Vitals:   04/24/13 1340 04/24/13 2300 04/25/13 0536 04/25/13 1411  BP:  134/84 126/70 115/58  Pulse:  96 83 79  Temp:  98.1 F (36.7 C) 98.4 F (36.9 C) 98.5 F (36.9 C)  TempSrc:  Oral Oral Oral  Resp:  18 18 20   Height: 6' 1"  (1.854 m)     Weight: 182 lb (82.555 kg)     SpO2:  98% 99% 98%   Weight change:   Intake/Output Summary (Last 24 hours) at 04/25/13 1420 Last data filed at 04/25/13 1410  Gross per 24 hour  Intake    840 ml  Output      0 ml  Net    840 ml  General: alert, cooperative, and in no apparent distress HEENT: pupils equal round and reactive to light, vision grossly intact, oropharynx clear and non-erythematous  Neck: supple, no lymphadenopathy Lungs: clear to ascultation bilaterally, normal work of respiration, no wheezes, rales, ronchi Heart: regular rate and rhythm, grade III/VI early systolic murmur best heard at the left upper sternal border, no gallops or rubs Abdomen: soft, mildly tender to RLQ and LLQ palpation, non-distended, +bs  Extremities: no cyanosis, clubbing, or edema Neurologic: alert & oriented X3, cranial nerves II-XII intact, strength grossly intact, sensation intact to light touch   Lab Results: Basic Metabolic Panel:  Recent Labs Lab 04/21/13 0530  04/24/13 1005 04/25/13 0555  NA 144  < > 140 139  K 3.3*  < > 3.6 4.3  CL 109  < > 102 102  CO2 26  < > 28 27  GLUCOSE 132*  < > 183* 134*  BUN 6  < > 9 9  CREATININE 0.83  < > 0.73 0.66  CALCIUM 8.4  < > 8.5 8.8  MG 2.0  --   --   --   < > = values in this interval not displayed. Liver Function Tests:  Recent Labs Lab 04/20/13 1047 04/23/13 0503  AST 101* 52*  ALT 24 29  ALKPHOS 71 61  BILITOT 0.3 0.1*  PROT 6.5 6.0  ALBUMIN 2.9* 2.5*    Recent Labs Lab  04/20/13 1047  LIPASE 20   CBC:  Recent Labs Lab 04/20/13 1047  04/24/13 1005 04/25/13 0555  WBC 17.4*  < > 13.7* 14.5*  NEUTROABS 11.4*  --   --   --   HGB 12.5*  < > 11.5* 12.0*  HCT 36.1*  < > 34.0* 35.4*  MCV 82.2  < > 84.2 84.3  PLT 283  < > 385 449*  < > = values in this interval not displayed. Urine Drug Screen: Drugs of Abuse     Component Value Date/Time   LABOPIA POSITIVE* 04/20/2013 1703   COCAINSCRNUR POSITIVE* 04/20/2013 1703   LABBENZ NONE DETECTED 04/20/2013 1703   AMPHETMU NONE DETECTED 04/20/2013 1703   THCU POSITIVE* 04/20/2013 1703   LABBARB NONE DETECTED 04/20/2013 1703      Micro Results: Recent Results (from the past 240 hour(s))  CLOSTRIDIUM DIFFICILE BY PCR     Status: None   Collection Time    04/21/13  4:09 PM      Result Value Range Status   C difficile by pcr NEGATIVE  NEGATIVE Final  STOOL CULTURE     Status:  None   Collection Time    04/23/13  5:23 PM      Result Value Range Status   Specimen Description STOOL   Final   Special Requests Immunocompromised   Final   Culture NO SUSPICIOUS COLONIES, CONTINUING TO HOLD   Final   Report Status PENDING   Incomplete  OVA AND PARASITE EXAMINATION     Status: None   Collection Time    04/23/13  5:23 PM      Result Value Range Status   Specimen Description STOOL   Final   Special Requests Immunocompromised   Final   Ova and parasites NO OVA OR PARASITES SEEN MODERATE YEAST   Final   Report Status 04/24/2013 FINAL   Final   Studies/Results: No results found. Medications: I have reviewed the patient's current medications. Scheduled Meds: . hyoscyamine  0.375 mg Oral Q12H  . Mesalamine  1,600 mg Oral TID  . mesalamine  1,000 mg Rectal BID  . methylPREDNISolone (SOLU-MEDROL) injection  40 mg Intravenous Q12H  . OLANZapine  5 mg Oral QHS  . potassium chloride  40 mEq Oral BID   Continuous Infusions:  PRN Meds:.HYDROcodone-acetaminophen, LORazepam, ondansetron (ZOFRAN) IV,  ondansetron  Assessment/Plan: 1. Ulcerative colitis- Diagnosed in 2006, 4 flares with most recent in 2013. Flex sig in 2/13 showed severe left sided UC. Pt reports bloody diarrhea, up to 8 episodes/day, with associated abdominal pain for the past week. No fever, nausea/vomiting. FOBT positive in ED. Received one dose of solumedrol 125 mg IV in ED, then switched to oral prednisone 7/7, but changed back to IV solumedrol 7/9 due to lack of improvement.  Stool negative for c diff, O&P, culture. -GI following, appreciate recs  -continue mesalamine PO and suppository -continue solumedrol, plan to transition to PO prednisone tomorrow if still improving -hydrocodone-acetominophen for pain  -CBC daily  -regular diet  -Zofran prn for nausea   2. Schizophrenia, paranoid- Pt previously seen at [facility], prescribed risperidone in past but switched to Geodon and Ativan in 1/14 due to concerns about male breast development. However, pt still believes he most recently took Risperidone.  -greatly appreciate psych input -will start zyprexa 5 mg nightly  3. DVT PPX- SCDs   Dispo: Disposition is deferred at this time, awaiting improvement of current medical problems.  Anticipated discharge in approximately 2-3 day(s).   .Services Needed at time of discharge: Y = Yes, Blank = No PT:   OT:   RN:   Equipment:   Other:     LOS: 5 days   Hester Mates, MD 04/25/2013, 2:20 PM

## 2013-04-25 NOTE — Progress Notes (Signed)
[facility] Daily Rounding Note 04/25/2013, 8:40 AM  SUBJECTIVE:       Used 25 mg Vicodin yesterday (5 doses). Says pain is better but still reaches to 7/10.  He says only 4 stools yesterday, down from 8 or so the previous day.   Stools not bloody like they were.  Sleeping well.  Eating well of solid diet  OBJECTIVE:         Vital signs in last 24 hours:    Temp:  [97.9 F (36.6 C)-98.4 F (36.9 C)] 98.4 F (36.9 C) (07/11 0536) Pulse Rate:  [83-96] 83 (07/11 0536) Resp:  [18-20] 18 (07/11 0536) BP: (121-134)/(66-84) 126/70 mmHg (07/11 0536) SpO2:  [98 %-99 %] 99 % (07/11 0536) Weight:  [82.555 kg (182 lb)] 82.555 kg (182 lb) (07/10 1340) Last BM Date: 04/24/13 General: looks well   Heart: rrr Chest: clear bil Abdomen: soft, minor tenderness, non-focal.  I saw stool in bedpan and it is re/orange and still grossly bloody to the trained observer.  Extremities: no CCe Neuro/Psych:  Cooperative, no agitation but perhaps slightly anxious. Child-like affect and responses.   Intake/Output from previous day: 07/10 0701 - 07/11 0700 In: 1080 [P.O.:1080] Out: -   Intake/Output this shift:    Lab Results:  Recent Labs  04/23/13 0503 04/24/13 1005 04/25/13 0555  WBC 14.8* 13.7* 14.5*  HGB 11.9* 11.5* 12.0*  HCT 36.1* 34.0* 35.4*  PLT 352 385 449*   BMET  Recent Labs  04/23/13 0503 04/24/13 1005 04/25/13 0555  NA 143 140 139  K 3.3* 3.6 4.3  CL 107 102 102  CO2 29 28 27   GLUCOSE 83 183* 134*  BUN 9 9 9   CREATININE 0.78 0.73 0.66  CALCIUM 8.2* 8.5 8.8   LFT  Recent Labs  04/23/13 0503  PROT 6.0  ALBUMIN 2.5*  AST 52*  ALT 29  ALKPHOS 61  BILITOT 0.1*   Studies/Results: Ct Abdomen Pelvis W Contrast 04/23/2013     IMPRESSION:  1. Moderate left-sided colitis involving the descending and sigmoid colon, which is increased since previous study. Differential diagnosis includes ulcerative colitis and infectious colitis. 2.  Stable mild lymphadenopathy in the  left para-aortic region and sigmoid mesocolon.  This is nonspecific, but may be reactive in etiology.   Original Report Authenticated By: Earle Gell, M.D.    ASSESMENT: *  Left sided Crohn's colitis.  Bloody stools and abdominal pain.  Seem to be improved but very slowly.  Hx non-compliance with medications and medical follow up.   Day 3 of restarted Solumedrol (failed to improve with po Prednisone), day 7 total steroids.  Day 3 rectal Canasa.  Day 5 oral Asacol.    *  Schizophrenia.  *   Leukocytosis.  Probably from steroids.   PLAN: *  Has 05/21/13 ROV with Dr Ardis Hughs.  *  Reiterated the need to stay on meds and keep all appointments after discharge. *  Stay on IV solumedrol until Dr Ardis Hughs changes back to po Prednisone.  *  Will trial BID Hyoscyamine, see if it helps the pain.    LOS: 5 days   Azucena Freed  04/25/2013, 8:40 AM Pager: [phone]  ________________________________________________________________________  [facility] MD note:  I personally examined the patient, reviewed the data and agree with the assessment and plan described above.  He should stay on IV steroids another 24 hours then if continues with lower number of stools we can switch him to oral prednisone again.  Owens Loffler, MD [facility] Pager [phone]

## 2013-04-26 LAB — BASIC METABOLIC PANEL
BUN: 14 mg/dL (ref 6–23)
Chloride: 101 mEq/L (ref 96–112)
GFR calc Af Amer: >90 mL/min (ref >90)
GFR calc non Af Amer: >90 mL/min (ref >90)
Glucose, Bld: 159 mg/dL — ABNORMAL HIGH (ref 70–99)
Potassium: 4.5 mEq/L (ref 3.5–5.1)
Sodium: 140 mEq/L (ref 135–145)

## 2013-04-26 LAB — CBC
HCT: 37.7 % — ABNORMAL LOW (ref 39.0–52.0)
Hemoglobin: 12.6 g/dL — ABNORMAL LOW (ref 13.0–17.0)
MCHC: 33.4 g/dL (ref 30.0–36.0)
RDW: 16.1 % — ABNORMAL HIGH (ref 11.5–15.5)
WBC: 15.8 10*3/uL — ABNORMAL HIGH (ref 4.0–10.5)

## 2013-04-26 MED ORDER — PREDNISONE 20 MG PO TABS
20.0000 mg | ORAL_TABLET | Freq: Two times a day (BID) | ORAL | Status: DC
  Administered 2013-04-26 – 2013-04-27 (×2): 20 mg via ORAL
  Filled 2013-04-26 (×3): qty 1

## 2013-04-26 NOTE — Progress Notes (Signed)
Subjective: The patient notes more abdominal pain, with bloody stools, though nursing report does not corroborate this.  Objective: Vital signs in last 24 hours: Filed Vitals:   04/25/13 1411 04/25/13 2140 04/26/13 0542 04/26/13 1410  BP: 115/58 118/56 108/56 110/60  Pulse: 79 89 67 95  Temp: 98.5 F (36.9 C) 98.6 F (37 C) 98 F (36.7 C) 98.4 F (36.9 C)  TempSrc: Oral Oral Oral Oral  Resp: 20 18 16 18   Height:      Weight:      SpO2: 98% 98% 99% 100%   Weight change:   Intake/Output Summary (Last 24 hours) at 04/26/13 2022 Last data filed at 04/26/13 1700  Gross per 24 hour  Intake    720 ml  Output      0 ml  Net    720 ml  General: alert, cooperative, and in no apparent distress HEENT: pupils equal round and reactive to light, vision grossly intact, oropharynx clear and non-erythematous  Neck: supple, no lymphadenopathy Lungs: clear to ascultation bilaterally, normal work of respiration, no wheezes, rales, ronchi Heart: regular rate and rhythm, grade III/VI early systolic murmur best heard at the left upper sternal border, no gallops or rubs Abdomen: soft, mildly tender to RLQ and LLQ palpation, non-distended, +bs  Extremities: no cyanosis, clubbing, or edema Neurologic: alert & oriented X3, cranial nerves II-XII intact, strength grossly intact, sensation intact to light touch   Lab Results: Basic Metabolic Panel:  Recent Labs Lab 04/21/13 0530  04/25/13 0555 04/26/13 0540  NA 144  < > 139 140  K 3.3*  < > 4.3 4.5  CL 109  < > 102 101  CO2 26  < > 27 33*  GLUCOSE 132*  < > 134* 159*  BUN 6  < > 9 14  CREATININE 0.83  < > 0.66 0.66  CALCIUM 8.4  < > 8.8 9.0  MG 2.0  --   --   --   < > = values in this interval not displayed. Liver Function Tests:  Recent Labs Lab 04/20/13 1047 04/23/13 0503  AST 101* 52*  ALT 24 29  ALKPHOS 71 61  BILITOT 0.3 0.1*  PROT 6.5 6.0  ALBUMIN 2.9* 2.5*    Recent Labs Lab 04/20/13 1047  LIPASE 20    CBC:  Recent Labs Lab 04/20/13 1047  04/25/13 0555 04/26/13 0540  WBC 17.4*  < > 14.5* 15.8*  NEUTROABS 11.4*  --   --   --   HGB 12.5*  < > 12.0* 12.6*  HCT 36.1*  < > 35.4* 37.7*  MCV 82.2  < > 84.3 85.1  PLT 283  < > 449* 497*  < > = values in this interval not displayed. Urine Drug Screen: Drugs of Abuse     Component Value Date/Time   LABOPIA POSITIVE* 04/20/2013 1703   COCAINSCRNUR POSITIVE* 04/20/2013 1703   LABBENZ NONE DETECTED 04/20/2013 1703   AMPHETMU NONE DETECTED 04/20/2013 1703   THCU POSITIVE* 04/20/2013 1703   LABBARB NONE DETECTED 04/20/2013 1703      Micro Results: Recent Results (from the past 240 hour(s))  CLOSTRIDIUM DIFFICILE BY PCR     Status: None   Collection Time    04/21/13  4:09 PM      Result Value Range Status   C difficile by pcr NEGATIVE  NEGATIVE Final  STOOL CULTURE     Status: None   Collection Time    04/23/13  5:23 PM  Result Value Range Status   Specimen Description STOOL   Final   Special Requests Immunocompromised   Final   Culture NO SUSPICIOUS COLONIES, CONTINUING TO HOLD   Final   Report Status PENDING   Incomplete  OVA AND PARASITE EXAMINATION     Status: None   Collection Time    04/23/13  5:23 PM      Result Value Range Status   Specimen Description STOOL   Final   Special Requests Immunocompromised   Final   Ova and parasites NO OVA OR PARASITES SEEN MODERATE YEAST   Final   Report Status 04/24/2013 FINAL   Final   Studies/Results: No results found. Medications: I have reviewed the patient's current medications. Scheduled Meds: . hyoscyamine  0.375 mg Oral Q12H  . Mesalamine  1,600 mg Oral TID  . mesalamine  1,000 mg Rectal BID  . OLANZapine  5 mg Oral QHS  . potassium chloride  40 mEq Oral BID  . predniSONE  20 mg Oral BID WC   Continuous Infusions:  PRN Meds:.HYDROcodone-acetaminophen, LORazepam, ondansetron (ZOFRAN) IV, ondansetron  Assessment/Plan: 1. Ulcerative colitis- Diagnosed in 2006, 4 flares with  most recent in 2013. Flex sig in 2/13 showed severe left sided UC. Pt reports bloody diarrhea, up to 8 episodes/day, with associated abdominal pain for the past week. No fever, nausea/vomiting. FOBT positive in ED. Received one dose of solumedrol 125 mg IV in ED, then switched to oral prednisone 7/7, but changed back to IV solumedrol 7/9 due to lack of improvement.  Stool negative for c diff, O&P, culture. -GI following, appreciate recs  -continue mesalamine PO and suppository -transitioned to PO prednisone today -hydrocodone-acetominophen for pain  -CBC daily  -regular diet  -Zofran prn for nausea   2. Schizophrenia, paranoid- Pt previously seen at [facility], prescribed risperidone in past but switched to Geodon and Ativan in 1/14 due to concerns about male breast development. However, pt still believes he most recently took Risperidone.  -greatly appreciate psych input -continue zyprexa 5 mg nightly  3. DVT PPX- SCDs   Dispo: Disposition is deferred at this time, awaiting improvement of current medical problems.  Anticipated discharge in approximately 1-2 days  .Services Needed at time of discharge: Y = Yes, Blank = No PT:   OT:   RN:   Equipment:   Other:     LOS: 6 days   Hester Mates, MD 04/26/2013, 8:22 PM

## 2013-04-26 NOTE — Progress Notes (Signed)
[name] was seen and examined this morning.  States that the pain and bowel movements have worsened overnight but we have no documentation to back that up.  When I saw [name] he was laying comfortably in bed somnolent but arousable in no apparent pain.  His abdomen was benign.  Appreciate GI's assessment and input and we will follow their recommendations.  He was switched to oral prednisone today with plans for discharge home tomorrow morning if he remains stable.

## 2013-04-26 NOTE — Progress Notes (Signed)
[name] Progress Note    Since last GI note: His history is not very reliable.  Tells me bleeding, BM frequency all improving however also says he went to BM 2 minutes before I saw him and also 4 minutes before I saw him and also about 2 hours before I saw him this AM.  "pain is back" also.    Objective: Vital signs in last 24 hours: Temp:  [98 F (36.7 C)-98.6 F (37 C)] 98 F (36.7 C) (07/12 0542) Pulse Rate:  [67-89] 67 (07/12 0542) Resp:  [16-20] 16 (07/12 0542) BP: (108-118)/(56-58) 108/56 mmHg (07/12 0542) SpO2:  [98 %-99 %] 99 % (07/12 0542) Last BM Date: 04/25/13 General: alert and oriented times 3 Heart: regular rate and rythm Abdomen: soft, non-tender, non-distended, normal bowel sounds   Lab Results:  Recent Labs  04/24/13 1005 04/25/13 0555 04/26/13 0540  WBC 13.7* 14.5* 15.8*  HGB 11.5* 12.0* 12.6*  PLT 385 449* 497*  MCV 84.2 84.3 85.1    Recent Labs  04/24/13 1005 04/25/13 0555 04/26/13 0540  NA 140 139 140  K 3.6 4.3 4.5  CL 102 102 101  CO2 28 27 33*  GLUCOSE 183* 134* 159*  BUN 9 9 14   CREATININE 0.73 0.66 0.66  CALCIUM 8.5 8.8 9.0   No results found for this basename: PROT, ALBUMIN, AST, ALT, ALKPHOS, BILITOT, BILIDIR, IBILI,  in the last 72 hours No results found for this basename: INR,  in the last 72 hours   Studies/Results: No results found.   Medications: Scheduled Meds: . hyoscyamine  0.375 mg Oral Q12H  . Mesalamine  1,600 mg Oral TID  . mesalamine  1,000 mg Rectal BID  . methylPREDNISolone (SOLU-MEDROL) injection  40 mg Intravenous Q12H  . OLANZapine  5 mg Oral QHS  . potassium chloride  40 mEq Oral BID   Continuous Infusions:  PRN Meds:.HYDROcodone-acetaminophen, LORazepam, ondansetron (ZOFRAN) IV, ondansetron    Assessment/Plan: 32 y.o. male UC  I asked him again to show all BMs to RN, he tells me he didn't know he had to do that which I find hard to understand.  Will change to po prednisone  today, OK to d/c tomorrow unless clear, documented evidence that he is continuing to have frequent bloody stools.  Please call or page with any further questions or concerns.  He has appointment scheduled already to see me in follow up in 3-4 weeks. He should NOT taper his prednisone at all prior to that appt. (57m bid).     Keith Loffler MD  04/26/2013, 8:44 AM [facility] Pager ([phone]

## 2013-04-27 ENCOUNTER — Inpatient Hospital Stay (HOSPITAL_COMMUNITY)
Admission: EM | Admit: 2013-04-27 | Discharge: 2013-05-02 | Disposition: A | Discharge status: Home / Self Care | Payer: Medicare Other | Source: Home / Self Care | Attending: Internal Medicine | Admitting: Internal Medicine

## 2013-04-27 ENCOUNTER — Encounter (HOSPITAL_COMMUNITY): Payer: Self-pay | Admitting: *Deleted

## 2013-04-27 ENCOUNTER — Inpatient Hospital Stay (HOSPITAL_COMMUNITY): Payer: Medicare Other

## 2013-04-27 DIAGNOSIS — F1410 Cocaine abuse, uncomplicated: Secondary | ICD-10-CM | POA: Diagnosis present

## 2013-04-27 DIAGNOSIS — K5180 Other ulcerative colitis without complications: Secondary | ICD-10-CM

## 2013-04-27 DIAGNOSIS — F1210 Cannabis abuse, uncomplicated: Secondary | ICD-10-CM | POA: Diagnosis present

## 2013-04-27 DIAGNOSIS — K5289 Other specified noninfective gastroenteritis and colitis: Secondary | ICD-10-CM

## 2013-04-27 DIAGNOSIS — R197 Diarrhea, unspecified: Secondary | ICD-10-CM

## 2013-04-27 DIAGNOSIS — F209 Schizophrenia, unspecified: Secondary | ICD-10-CM

## 2013-04-27 DIAGNOSIS — K5900 Constipation, unspecified: Secondary | ICD-10-CM

## 2013-04-27 DIAGNOSIS — R1084 Generalized abdominal pain: Secondary | ICD-10-CM

## 2013-04-27 DIAGNOSIS — K5190 Ulcerative colitis, unspecified, without complications: Secondary | ICD-10-CM | POA: Diagnosis present

## 2013-04-27 DIAGNOSIS — D72829 Elevated white blood cell count, unspecified: Secondary | ICD-10-CM

## 2013-04-27 DIAGNOSIS — K51918 Ulcerative colitis, unspecified with other complication: Secondary | ICD-10-CM

## 2013-04-27 DIAGNOSIS — F1910 Other psychoactive substance abuse, uncomplicated: Secondary | ICD-10-CM

## 2013-04-27 DIAGNOSIS — F17200 Nicotine dependence, unspecified, uncomplicated: Secondary | ICD-10-CM | POA: Diagnosis present

## 2013-04-27 DIAGNOSIS — F200 Paranoid schizophrenia: Secondary | ICD-10-CM | POA: Diagnosis present

## 2013-04-27 DIAGNOSIS — E876 Hypokalemia: Secondary | ICD-10-CM

## 2013-04-27 DIAGNOSIS — R112 Nausea with vomiting, unspecified: Secondary | ICD-10-CM

## 2013-04-27 DIAGNOSIS — K529 Noninfective gastroenteritis and colitis, unspecified: Secondary | ICD-10-CM

## 2013-04-27 LAB — RAPID URINE DRUG SCREEN, HOSP PERFORMED
Barbiturates: NOT DETECTED
Cocaine: NOT DETECTED
Tetrahydrocannabinol: NOT DETECTED

## 2013-04-27 LAB — COMPREHENSIVE METABOLIC PANEL
ALT: 157 U/L — ABNORMAL HIGH (ref 0–53)
Alkaline Phosphatase: 126 U/L — ABNORMAL HIGH (ref 39–117)
BUN: 23 mg/dL (ref 6–23)
CO2: 30 mEq/L (ref 19–32)
Chloride: 94 mEq/L — ABNORMAL LOW (ref 96–112)
GFR calc Af Amer: >90 mL/min (ref >90)
GFR calc non Af Amer: >90 mL/min (ref >90)
Glucose, Bld: 236 mg/dL — ABNORMAL HIGH (ref 70–99)
Potassium: 3.9 mEq/L (ref 3.5–5.1)
Sodium: 137 mEq/L (ref 135–145)
Total Bilirubin: 0.2 mg/dL — ABNORMAL LOW (ref 0.3–1.2)

## 2013-04-27 LAB — CBC WITH DIFFERENTIAL/PLATELET
Basophils Relative: 1 % (ref 0–1)
Eosinophils Relative: 1 % (ref 0–5)
HCT: 41 % (ref 39.0–52.0)
Hemoglobin: 14.1 g/dL (ref 13.0–17.0)
Lymphocytes Relative: 14 % (ref 12–46)
Neutro Abs: 10.7 10*3/uL — ABNORMAL HIGH (ref 1.7–7.7)
Neutrophils Relative %: 70 % (ref 43–77)
RBC: 4.86 MIL/uL (ref 4.22–5.81)

## 2013-04-27 LAB — URINALYSIS, ROUTINE W REFLEX MICROSCOPIC
Ketones, ur: NEGATIVE mg/dL
Nitrite: NEGATIVE
Protein, ur: NEGATIVE mg/dL
Urobilinogen, UA: 0.2 mg/dL (ref 0.0–1.0)
pH: 6.5 (ref 5.0–8.0)

## 2013-04-27 LAB — STOOL CULTURE

## 2013-04-27 MED ORDER — ACETAMINOPHEN 650 MG RE SUPP: 650.0000 mg | Freq: Four times a day (QID) | RECTAL | Status: DC | PRN

## 2013-04-27 MED ORDER — MESALAMINE 1000 MG RE SUPP
1000.0000 mg | Freq: Two times a day (BID) | RECTAL | Status: DC
  Administered 2013-04-27 – 2013-05-02 (×10): 1000 mg via RECTAL
  Filled 2013-04-27 (×12): qty 1

## 2013-04-27 MED ORDER — SODIUM CHLORIDE 0.9 % IV SOLN
Freq: Once | INTRAVENOUS | Status: AC
  Administered 2013-04-27: 17:00:00 via INTRAVENOUS

## 2013-04-27 MED ORDER — IPRATROPIUM BROMIDE 0.02 % IN SOLN
0.5000 mg | Freq: Once | RESPIRATORY_TRACT | Status: DC
  Filled 2013-04-27: qty 2.5

## 2013-04-27 MED ORDER — IOHEXOL 300 MG/ML  SOLN: 20.0000 mL | INTRAMUSCULAR | Status: AC

## 2013-04-27 MED ORDER — HYDROCODONE-ACETAMINOPHEN 5-325 MG PO TABS: 1.0000 | ORAL_TABLET | ORAL | Status: DC | PRN

## 2013-04-27 MED ORDER — ACETAMINOPHEN 325 MG PO TABS: 650.0000 mg | ORAL_TABLET | Freq: Four times a day (QID) | ORAL | Status: DC | PRN

## 2013-04-27 MED ORDER — ALBUTEROL SULFATE (5 MG/ML) 0.5% IN NEBU
5.0000 mg | INHALATION_SOLUTION | Freq: Once | RESPIRATORY_TRACT | Status: DC
  Filled 2013-04-27: qty 0.5

## 2013-04-27 MED ORDER — PREDNISONE 20 MG PO TABS: 20.0000 mg | ORAL_TABLET | Freq: Two times a day (BID) | ORAL | Status: DC

## 2013-04-27 MED ORDER — METHYLPREDNISOLONE SODIUM SUCC 125 MG IJ SOLR
60.0000 mg | Freq: Two times a day (BID) | INTRAMUSCULAR | Status: DC
  Administered 2013-04-27 – 2013-04-28 (×2): 60 mg via INTRAVENOUS
  Filled 2013-04-27 (×4): qty 0.96

## 2013-04-27 MED ORDER — SODIUM CHLORIDE 0.9 % IV SOLN
INTRAVENOUS | Status: DC
  Administered 2013-04-27 – 2013-04-30 (×4): via INTRAVENOUS

## 2013-04-27 MED ORDER — MESALAMINE 400 MG PO CPDR: 1600.0000 mg | DELAYED_RELEASE_CAPSULE | Freq: Three times a day (TID) | ORAL | Status: DC

## 2013-04-27 MED ORDER — SODIUM CHLORIDE 0.9 % IJ SOLN
3.0000 mL | Freq: Two times a day (BID) | INTRAMUSCULAR | Status: DC
  Administered 2013-04-28 – 2013-04-30 (×3): 3 mL via INTRAVENOUS

## 2013-04-27 MED ORDER — ONDANSETRON HCL 4 MG/2ML IJ SOLN
4.0000 mg | Freq: Once | INTRAMUSCULAR | Status: AC
  Administered 2013-04-27: 4 mg via INTRAVENOUS
  Filled 2013-04-27: qty 2

## 2013-04-27 MED ORDER — OLANZAPINE 5 MG PO TABS: 5.0000 mg | ORAL_TABLET | Freq: Every day | ORAL | Status: DC

## 2013-04-27 MED ORDER — MORPHINE SULFATE 2 MG/ML IJ SOLN
2.0000 mg | INTRAMUSCULAR | Status: DC | PRN
  Administered 2013-04-27 – 2013-04-28 (×6): 2 mg via INTRAVENOUS
  Filled 2013-04-27 (×6): qty 1

## 2013-04-27 MED ORDER — MESALAMINE 1000 MG RE SUPP: 1000.0000 mg | Freq: Two times a day (BID) | RECTAL | Status: DC

## 2013-04-27 MED ORDER — HYDROMORPHONE HCL PF 1 MG/ML IJ SOLN
1.0000 mg | Freq: Once | INTRAMUSCULAR | Status: AC
  Administered 2013-04-27: 1 mg via INTRAVENOUS
  Filled 2013-04-27: qty 1

## 2013-04-27 MED ORDER — IOHEXOL 300 MG/ML  SOLN
100.0000 mL | Freq: Once | INTRAMUSCULAR | Status: AC | PRN
  Administered 2013-04-27: 100 mL via INTRAVENOUS

## 2013-04-27 NOTE — ED Notes (Signed)
patietn back from CT and taking to upstairs

## 2013-04-27 NOTE — Progress Notes (Signed)
50cc of bloody stool w/ clots noted in toilet

## 2013-04-27 NOTE — ED Provider Notes (Signed)
History    CSN: [id number] Arrival date & time 04/27/13  1531  First MD Initiated Contact with Patient 04/27/13 1616     Chief Complaint  Patient presents with  . Abdominal Pain  . Emesis  . Diarrhea   (Consider location/radiation/quality/duration/timing/severity/associated sxs/prior Treatment) HPI  [name] is a(n) 32 y.o. male who presents to the emergency department with chief complaint of nausea, vomiting abdominal pain and bloody diarrhea.  The patient was admitted 04/20/2013 flareup of his ulcerative colitis.  He was discharged this morning around 10:30 AM.  Patient states that he was not discharged with any medications although chart review shows he was discharged with mesalamine prednisone and pain medications.  He should states he went home and he tried to lay still and he tried to take in food and fluid  home and was unable to keep anything down.  He had his last episode of bloody mucousy diarrhea 1 hour prior to arrival at the emergency department.  Past Medical History  Diagnosis Date  . Ulcerative colitis   . Schizophrenia    Past Surgical History  Procedure Laterality Date  . Mandible fracture surgery    . Flexible sigmoidoscopy  12/05/2011    Procedure: FLEXIBLE SIGMOIDOSCOPY;  Surgeon: Beryle Beams, MD;  Location: [facility] ENDOSCOPY;  Service: Endoscopy;  Laterality: N/A;   No family history on file. History  Substance Use Topics  . Smoking status: Current Every Day Smoker -- 0.25 packs/day for 20 years    Types: Cigarettes  . Smokeless tobacco: Not on file  . Alcohol Use: Yes     Comment: occasional    Review of Systems Ten systems reviewed and are negative for acute change, except as noted in the HPI.   Allergies  Depakote; Lithium; and Nsaids  Home Medications  No current outpatient prescriptions on file. BP 117/73  Pulse 116  Temp(Src) 98.3 F (36.8 C) (Oral)  Resp 18  SpO2 100% Physical Exam Physical Exam  Nursing note and vitals  reviewed. Constitutional: The patient appears clinically dehydrated.  He is very pleasant.  He has vomited in his emesis bag that appears to be phlegm mixed with gastric secretion  HENT:  Head: Normocephalic and atraumatic.  Eyes: Conjunctivae normal are normal. No scleral icterus.  Neck: Normal range of motion. Neck supple.  Cardiovascular: Normal rate, regular rhythm and normal heart sounds.   Pulmonary/Chest: Effort normal and breath sounds normal. No respiratory distress.  Abdominal: Soft.  Diffusely tender to palpation.  Worst in the lower abdominal quadrants.   Musculoskeletal: He exhibits no edema.  Neurological: He is alert.  Skin: Skin is warm and dry. He is not diaphoretic.  Psychiatric: His behavior is normal.    ED Course  Procedures (including critical care time) Labs Reviewed  CBC WITH DIFFERENTIAL - Abnormal; Notable for the following:    WBC 15.3 (*)    RDW 15.7 (*)    Platelets 597 (*)    Monocytes Relative 14 (*)    Neutro Abs 10.7 (*)    Monocytes Absolute 2.1 (*)    Basophils Absolute 0.2 (*)    All other components within normal limits  COMPREHENSIVE METABOLIC PANEL - Abnormal; Notable for the following:    Chloride 94 (*)    Glucose, Bld 236 (*)    Albumin 3.1 (*)    AST 97 (*)    ALT 157 (*)    Alkaline Phosphatase 126 (*)    Total Bilirubin 0.2 (*)  All other components within normal limits  LIPASE, BLOOD  URINALYSIS, ROUTINE W REFLEX MICROSCOPIC  URINE RAPID DRUG SCREEN (HOSP PERFORMED)   No results found. 1. Ulcerative colitis, other complication   2. Transaminitis     MDM  5:48 PM Filed Vitals:   04/27/13 1537  BP: 117/73  Pulse: 116  Temp: 98.3 F (36.8 C)  Resp: 18   Patient is a bounce back from discharge this morning by internal medicine teaching service. I spoken with Dr. Burnard Bunting one of the residents who has come to evaluate the patient.  The patient did not wish to be admitted but does seem medical provider team they are  working on a new plan.  He does have elevation in his liver enzymes.  He is a previous cocaine positive urine.   Patient has been switched to [facility] hospitalist service. per his request and admitted by Dr. Faylene Kurtz, PA-C 04/27/13 1904

## 2013-04-27 NOTE — Progress Notes (Signed)
Subjective: The patient notes only 4 bowel movements overnight, though still with mild blood.  Hemoglobin has been uptrending over the last several days.  The patient still notes some abdominal pain, but was observed later walking around the hallways in seemingly no distress.  Objective: Vital signs in last 24 hours: Filed Vitals:   04/26/13 0542 04/26/13 1410 04/26/13 2205 04/27/13 0650  BP: 108/56 110/60 117/56 104/62  Pulse: 67 95 87 84  Temp: 98 F (36.7 C) 98.4 F (36.9 C) 98.3 F (36.8 C) 97.9 F (36.6 C)  TempSrc: Oral Oral Oral Oral  Resp: 16 18 20 16   Height:      Weight:      SpO2: 99% 100% 98% 98%   Weight change:   Intake/Output Summary (Last 24 hours) at 04/27/13 0955 Last data filed at 04/27/13 0805  Gross per 24 hour  Intake    480 ml  Output     53 ml  Net    427 ml  General: alert, cooperative, and in no apparent distress HEENT: pupils equal round and reactive to light, vision grossly intact, oropharynx clear and non-erythematous  Neck: supple, no lymphadenopathy Lungs: clear to ascultation bilaterally, normal work of respiration, no wheezes, rales, ronchi Heart: regular rate and rhythm, grade III/VI early systolic murmur best heard at the left upper sternal border, no gallops or rubs Abdomen: soft, mildly tender to RLQ and LLQ palpation, non-distended, +bs  Extremities: no cyanosis, clubbing, or edema Neurologic: alert & oriented X3, cranial nerves II-XII intact, strength grossly intact, sensation intact to light touch   Lab Results: Basic Metabolic Panel:  Recent Labs Lab 04/21/13 0530  04/25/13 0555 04/26/13 0540  NA 144  < > 139 140  K 3.3*  < > 4.3 4.5  CL 109  < > 102 101  CO2 26  < > 27 33*  GLUCOSE 132*  < > 134* 159*  BUN 6  < > 9 14  CREATININE 0.83  < > 0.66 0.66  CALCIUM 8.4  < > 8.8 9.0  MG 2.0  --   --   --   < > = values in this interval not displayed. Liver Function Tests:  Recent Labs Lab 04/20/13 1047 04/23/13 0503    AST 101* 52*  ALT 24 29  ALKPHOS 71 61  BILITOT 0.3 0.1*  PROT 6.5 6.0  ALBUMIN 2.9* 2.5*    Recent Labs Lab 04/20/13 1047  LIPASE 20   CBC:  Recent Labs Lab 04/20/13 1047  04/25/13 0555 04/26/13 0540  WBC 17.4*  < > 14.5* 15.8*  NEUTROABS 11.4*  --   --   --   HGB 12.5*  < > 12.0* 12.6*  HCT 36.1*  < > 35.4* 37.7*  MCV 82.2  < > 84.3 85.1  PLT 283  < > 449* 497*  < > = values in this interval not displayed. Urine Drug Screen: Drugs of Abuse     Component Value Date/Time   LABOPIA POSITIVE* 04/20/2013 1703   COCAINSCRNUR POSITIVE* 04/20/2013 1703   LABBENZ NONE DETECTED 04/20/2013 1703   AMPHETMU NONE DETECTED 04/20/2013 1703   THCU POSITIVE* 04/20/2013 1703   LABBARB NONE DETECTED 04/20/2013 1703      Micro Results: Recent Results (from the past 240 hour(s))  CLOSTRIDIUM DIFFICILE BY PCR     Status: None   Collection Time    04/21/13  4:09 PM      Result Value Range Status   C difficile  by pcr NEGATIVE  NEGATIVE Final  STOOL CULTURE     Status: None   Collection Time    04/23/13  5:23 PM      Result Value Range Status   Specimen Description STOOL   Final   Special Requests Immunocompromised   Final   Culture NO SUSPICIOUS COLONIES, CONTINUING TO HOLD   Final   Report Status PENDING   Incomplete  OVA AND PARASITE EXAMINATION     Status: None   Collection Time    04/23/13  5:23 PM      Result Value Range Status   Specimen Description STOOL   Final   Special Requests Immunocompromised   Final   Ova and parasites NO OVA OR PARASITES SEEN MODERATE YEAST   Final   Report Status 04/24/2013 FINAL   Final   Studies/Results: No results found. Medications: I have reviewed the patient's current medications. Scheduled Meds: . hyoscyamine  0.375 mg Oral Q12H  . Mesalamine  1,600 mg Oral TID  . mesalamine  1,000 mg Rectal BID  . OLANZapine  5 mg Oral QHS  . potassium chloride  40 mEq Oral BID  . predniSONE  20 mg Oral BID WC   Continuous Infusions:  PRN  Meds:.HYDROcodone-acetaminophen, LORazepam, ondansetron (ZOFRAN) IV, ondansetron  Assessment/Plan: 1. Ulcerative colitis- Diagnosed in 2006, 4 flares with most recent in 2013. Flex sig in 2/13 showed severe left sided UC. FOBT positive in ED.  Stool negative for c diff, O&P, culture. -GI following, appreciate recs  -continue mesalamine PO and suppository -continue PO prednisone 20 mg BID until GI follow-up -hydrocodone-acetominophen for pain  -regular diet   2. Schizophrenia, paranoid- Pt previously seen at [facility], prescribed risperidone in past but switched to Geodon and Ativan in 1/14 due to concerns about male breast development. However, pt still believes he most recently took Risperidone.  -greatly appreciate psych input -continue zyprexa 5 mg nightly, as recommended  3. DVT PPX- SCDs   Dispo: Patient stable for discharge today  .Services Needed at time of discharge: Y = Yes, Blank = No PT:   OT:   RN:   Equipment:   Other:     LOS: 7 days   Hester Mates, MD 04/27/2013, 9:55 AM

## 2013-04-27 NOTE — Progress Notes (Signed)
Pt was sitting at the nursing station waiting on a SW for a bus pass to go home.  Pt left  On his own w/o getting the bus pass.  Pt was given discharge instructions and prescriptions.  Pt left his ;medicaid card and a balloon and cards in the room.  i called the pt's home number, talked w/ a male and told her about the medicaid card.  She said she or the pt will pick up the card tomorrow.  Medicaid card put in a sealed envelope with pt's name on it and placed at nursing station

## 2013-04-27 NOTE — ED Notes (Signed)
Pt has been having a flare of ulcerative colitis for one week.  Pt reports abdominal pain, vomiting, and bloody diarrhea.

## 2013-04-27 NOTE — ED Notes (Signed)
Admitting MD at the bedside.  

## 2013-04-27 NOTE — ED Notes (Signed)
Patient transported to CT 

## 2013-04-27 NOTE — Progress Notes (Signed)
Internal Medicine Attending  Date: 04/27/2013  Patient name: [name] Medical record number: [id number] Date of birth: 23-Apr-1981 Age: 32 y.o. Gender: male  I saw and evaluated the patient. I reviewed the resident's note by Dr. Owens Shark and I agree with the resident's findings and plans as documented in his progress note.  Bowel movements down to 4 over last 24 hours.  Ambulating throughout floor and hospital w/o pain.  Will continue mesalamine and prednisone at current doses until follow-up appointment with GI.  Agree with discharge home today.

## 2013-04-27 NOTE — Progress Notes (Signed)
200cc grossly bloody stool with clots,pt also vomited large amt. undigested food in trash can. Med for nausea and pain,however having difficulty with po pain med.

## 2013-04-27 NOTE — Discharge Summary (Signed)
Name: [name] MRN: [id number] DOB: November 04, 1980 32 y.o. PCP: Pcp Not In System  Date of Admission: 04/20/2013  9:57 AM Date of Discharge: 04/27/2013 Attending Physician: Karren Cobble, MD  Discharge Diagnosis: 1. Ulcerative Colitis - improved with prednisone, mesalamine 2. Schizophrenia - Psych consulted, started Zyprexa  Discharge Medications:   Medication List    STOP taking these medications       risperiDONE 1 MG tablet  Commonly known as:  RISPERDAL      TAKE these medications       HYDROcodone-acetaminophen 5-325 MG per tablet  Commonly known as:  NORCO/VICODIN  Take 1 tablet by mouth every 4 (four) hours as needed.     Mesalamine 400 MG Cpdr  Commonly known as:  ASACOL  Take 4 capsules (1,600 mg total) by mouth 3 (three) times daily.     mesalamine 1000 MG suppository  Commonly known as:  CANASA  Place 1 suppository (1,000 mg total) rectally 2 (two) times daily.     OLANZapine 5 MG tablet  Commonly known as:  ZYPREXA  Take 1 tablet (5 mg total) by mouth at bedtime.     predniSONE 20 MG tablet  Commonly known as:  DELTASONE  Take 1 tablet (20 mg total) by mouth 2 (two) times daily with a meal.        Disposition and follow-up:   [name] was discharged from [facility] in Stable condition.  At the hospital follow up visit please address:  1.  Ensure improvement in symptoms of abdominal pain and hematochezia.  2.  Labs / imaging needed at time of follow-up: CBC  3.  Pending labs/ test needing follow-up: None  Follow-up Appointments:     Follow-up Information   Follow up with Owens Loffler, MD On 05/21/2013. (10:00)    Contact information:   [address] [place] [address] [phone]       Follow up with [email or web address] On 05/20/2013. (3:45 pm)    Contact information:   [address] [place] [address] [phone]      Follow up with [facility].  (Walk-in clinic Monday-Friday)    Contact information:   [place] [address] [phone]      Discharge Instructions: Discharge Orders   Future Appointments Provider Department Dept Phone   05/21/2013 10:00 AM Milus Banister, MD [facility] [phone]   Future Orders Complete By Expires     Call MD for:  persistant nausea and vomiting  As directed     Call MD for:  temperature >100.4  As directed     Diet general  As directed     Increase activity slowly  As directed        Consultations: Gastroenterology (Dr. Ardis Hughs)  Procedures Performed:  Ct Abdomen Pelvis W Contrast  04/23/2013   *RADIOLOGY REPORT*  Clinical Data: Abdominal pain, left side greater than right. Bloody diarrhea.  Nausea vomiting.  Colitis.  CT ABDOMEN AND PELVIS WITH CONTRAST  Technique:  Multidetector CT imaging of the abdomen and pelvis was performed following the standard protocol during bolus administration of intravenous contrast.  Contrast: 124m OMNIPAQUE IOHEXOL 300 MG/ML  SOLN  Comparison: 12/01/2011  Findings: Moderate wall thickening is seen involving the descending and sigmoid portions of the colon which appears contiguous from the level of the splenic flexure to the rectum.  This is increased since previous study and is consistent with colitis.  There is no evidence of pneumatosis, abscess, or free fluid.  No evidence of bowel obstruction.  No evidence of bowel wall thickening involving the distal ileum or small bowel.  Normal appendix is visualized.  The liver, spleen, pancreas, gallbladder, adrenal glands, and kidneys are normal in appearance.  No evidence of hydronephrosis. Retroaortic left renal vein is incidentally noted. Mild lymphadenopathy is again seen in the in the left para-aortic region, with largest measuring 11 mm in short axis. Mild lymphadenopathy is also seen in the sigmoid mesocolon.  These are stable since prior exam.  No other soft tissue masses are  identified.  No suspicious bone lesions identified.  IMPRESSION:  1. Moderate left-sided colitis involving the descending and sigmoid colon, which is increased since previous study. Differential diagnosis includes ulcerative colitis and infectious colitis. 2.  Stable mild lymphadenopathy in the left para-aortic region and sigmoid mesocolon.  This is nonspecific, but may be reactive in etiology.   Original Report Authenticated By: Earle Gell, M.D.   Dg Abd 2 Views  04/20/2013   *RADIOLOGY REPORT*  Clinical Data: Abdominal pain  ABDOMEN - 2 VIEW  Comparison: 12/01/2011  Findings: The bowel gas pattern is nonobstructed.  Gas and stool noted throughout the colon up to the rectum.  No abnormal abdominal or pelvic calcifications.  IMPRESSION:  1.  Nonobstructive bowel gas pattern.   Original Report Authenticated By: Kerby Moors, M.D.    Admission HPI:  [name] is a 32 y/o man with history of ulcerative colitis (diagnosed in 2006 s/p flex sig in 2/13 which showed severe left sided UC) and paranoid schizophrenia. Pt states that approximately one week ago he began having bloody diarrhea and abdominal pain. He has been having up to eight episodes of diarrhea/day, all with blood, some dark in color. He has also had associated crampy abdominal pain, worse in the lower abdomen, nothing improves or worsens. He has not taken anything for this pain. He has not had any nausea/vomiting and has been able to eat normally. No fevers/chills, headaches, chest pain, SOB, dysuria.  Pt sees gastroenterologist Dr. Collene Mares but has not been seen in over a year. He states that he was diagnosed with UC in 2006 and has had four flares since that time, the last being in 2013, all of which have been similar to this one.  Of note, pt also has a history of schizophrenia. He has been seen at [facility] in the past but does not have any follow-up appointments. He has been taking risperidone but does not wish to continue taking this medication as  he is concerned about male breast development. He took his last risperidone four days ago. On chart review, appears that pt was most recently prescribed a combination of Geodon and Ativan but pt does not report taking these medications.   Hospital Course by problem list: 1. Ulcerative Colitis - The patient has a history of Ulcerative Colitis, diagnosed in 2006. The patient most recently had a flex sig in 11/2011 which showed left-sided disease.  The patient presented with abdominal pain and loose bloody bowel movements, consistent with a UC flare.  He was initially treated with PO prednisone, PO mesalamine, and mesalamine suppository.  Since the patient did not initially improve, he was changed to a higher dose of IV steroids, and a CT of the abdomen was obtained, which confirmed moderate left-sided colitis.  The patient's serial CBC's showed an initial drop in hemoglobin, followed by a steady rise in hemoglobin to 12.6 at discharge.  Of note, there was some discrepancy during the patient's hospitalization about his reporting of his symptoms compared to what the nursing staff observed, reporting more severe symptoms and more frequent bloody bowel movements than were observed.  The patient was discharged to continue oral steroids and mesalamine until GI follow-up.  2. Schizophrenia - The patient has a history of Schizophrenia.  On admission, he reports being out of all antipsychotics, though most recently on risperidone.  Additionally, he was not seeing a psychiatrist regularly.  A psychiatry consult was obtained, who recommended starting the patient on Zyprexa, as the patient was concerned about gynecomastia with risperidone.  The patient was encouraged to follow-up with [facility] at discharge.  Discharge Vitals:   BP 104/62  Pulse 84  Temp(Src) 97.9 F (36.6 C) (Oral)  Resp 16  Ht 6' 1"  (1.854 m)  Wt 182 lb (82.555 kg)  BMI 24.02 kg/m2  SpO2 98%  Discharge Labs:  CBC    Component  Value Date/Time   WBC 15.8* 04/26/2013 0540   RBC 4.43 04/26/2013 0540   HGB 12.6* 04/26/2013 0540   HCT 37.7* 04/26/2013 0540   PLT 497* 04/26/2013 0540   MCV 85.1 04/26/2013 0540   MCH 28.4 04/26/2013 0540   MCHC 33.4 04/26/2013 0540   RDW 16.1* 04/26/2013 0540   LYMPHSABS 1.9 04/20/2013 1047   MONOABS 1.7* 04/20/2013 1047   EOSABS 2.4* 04/20/2013 1047   BASOSABS 0.0 04/20/2013 1047      Signed: Hester Mates, MD 04/27/2013, 10:26 AM   Time Spent on Discharge: 45 minutes Services Ordered on Discharge: None Equipment Ordered on Discharge: None

## 2013-04-27 NOTE — ED Notes (Signed)
Patient asked for urine sample. He states that he is unable to give sample at this time

## 2013-04-27 NOTE — ED Provider Notes (Signed)
Pt relates he was admitted to the hospital from June 6 until this morning for his ulcerative colitis. He reports he was having nausea and vomiting but had not improved when he was discharged earlier today. He states he continues to have bloody stools. He states he went home and had vomiting so he presents back to the emergency department.  Patient is alert and cooperative sitting at the bedside in no distress.  Medical screening examination/treatment/procedure(s) were conducted as a shared visit with non-physician practitioner(s) and myself.  I personally evaluated the patient during the encounter  Rolland Porter, MD, Alanson Aly, MD 04/27/13 [phone]

## 2013-04-27 NOTE — ED Provider Notes (Signed)
See prior note   Janice Norrie, MD 04/27/13 1910

## 2013-04-27 NOTE — Progress Notes (Signed)
1850 04/27/13 nsg RN got report from ED RN at [facility]. Reported to incoming RN.pt not in room at this time 1936.

## 2013-04-27 NOTE — H&P (Addendum)
[facility] History and Physical  [name] RAX:[id number] DOB: Apr 02, 1981 DOA: 04/27/2013  Referring physician: Emergency department PCP: Pcp Not In System  Specialists:   Chief Complaint: Abd pain, GI bleeding  HPI: [name] is a 32 y.o. male who was discharged several hours ago from the teaching service for symptomatic ulcerative colitis. See the discharge summary filed at 12:16pm today. Briefly, the patient was followed by GI, continued on steroids and mesalamine, ultimately requiring higher doses of steroids. Hgb remained relatively stable, and the patient was recommended to continue on oral steroids and mesalamine and follow up with GI as an outpatient. Regarding the patient's schizophrenia, he patient was also instructed to continue on zyprexa for schizophrenia per Psych recs. The patient reports being unable to tolerate PO food and meds and feeling as if he were discharged "too soon." Pt subsequently presented back to the ED a few hours later. The teaching service was initially consulted, however, the patient refused to see the teaching service and instead requested hospitalist service.  Review of Systems: Abd pain, gi bleeding, remainder of 10pt review of systems negative  Past Medical History  Diagnosis Date  . Ulcerative colitis   . Schizophrenia    Past Surgical History  Procedure Laterality Date  . Mandible fracture surgery    . Flexible sigmoidoscopy  12/05/2011    Procedure: FLEXIBLE SIGMOIDOSCOPY;  Surgeon: Keith Beams, MD;  Location: [facility] ENDOSCOPY;  Service: Endoscopy;  Laterality: N/A;   Social History:  reports that he has been smoking Cigarettes.  He has a 5 pack-year smoking history. He does not have any smokeless tobacco history on file. He reports that  drinks alcohol. He reports that he uses illicit drugs (Cocaine and Marijuana).  where does patient live--home, ALF, SNF? and with whom if at home?  Can patient participate in ADLs?  Allergies   Allergen Reactions  . Depakote (Divalproex Sodium) Other (See Comments)    Reaction: seizures   . Lithium Other (See Comments)    Reaction: rectal bleeding  . Nsaids Other (See Comments)    Reaction: rectal bleeding    No family history on file.  (be sure to complete)  Prior to Admission medications   Not on File   Physical Exam: Filed Vitals:   04/27/13 1537  BP: 117/73  Pulse: 116  Temp: 98.3 F (36.8 C)  TempSrc: Oral  Resp: 18  SpO2: 100%     General:  Awake, in nad  Eyes: PERRL B  ENT: membranes moist, dentition fair  Neck: trachea midline, neck supple  Cardiovascular: regular, s1, s2  Respiratory: normal resp effort, no wheezing  Abdomen:  REBOUND TENDERNESS OVER R QUADRANTS, no organomegaly or masses  Skin: no abnormal skin lesions seen, normal skin turgor  Musculoskeletal: perfused, no clubbing or cyanosis  Psychiatric: normal  Neurologic: cn2-12 grossly in tact, strength and sensation intact  Labs on Admission:  Basic Metabolic Panel:  Recent Labs Lab 04/21/13 0530  04/23/13 0503 04/24/13 1005 04/25/13 0555 04/26/13 0540 04/27/13 1551  NA 144  < > 143 140 139 140 137  Brennan 3.3*  < > 3.3* 3.6 4.3 4.5 3.9  CL 109  < > 107 102 102 101 94*  CO2 26  < > 29 28 27  33* 30  GLUCOSE 132*  < > 83 183* 134* 159* 236*  BUN 6  < > 9 9 9 14 23   CREATININE 0.83  < > 0.78 0.73 0.66 0.66 0.95  CALCIUM 8.4  < >  8.2* 8.5 8.8 9.0 9.1  MG 2.0  --   --   --   --   --   --   < > = values in this interval not displayed. Liver Function Tests:  Recent Labs Lab 04/23/13 0503 04/27/13 1551  AST 52* 97*  ALT 29 157*  ALKPHOS 61 126*  BILITOT 0.1* 0.2*  PROT 6.0 7.4  ALBUMIN 2.5* 3.1*    Recent Labs Lab 04/27/13 1551  LIPASE 14   No results found for this basename: AMMONIA,  in the last 168 hours CBC:  Recent Labs Lab 04/23/13 0503 04/24/13 1005 04/25/13 0555 04/26/13 0540 04/27/13 1551  WBC 14.8* 13.7* 14.5* 15.8* 15.3*  NEUTROABS  --   --    --   --  10.7*  HGB 11.9* 11.5* 12.0* 12.6* 14.1  HCT 36.1* 34.0* 35.4* 37.7* 41.0  MCV 85.3 84.2 84.3 85.1 84.4  PLT 352 385 449* 497* 597*   Cardiac Enzymes: No results found for this basename: CKTOTAL, CKMB, CKMBINDEX, TROPONINI,  in the last 168 hours  BNP (last 3 results) No results found for this basename: PROBNP,  in the last 8760 hours CBG: No results found for this basename: GLUCAP,  in the last 168 hours  Radiological Exams on Admission: No results found.  EKG: Independently reviewed.   Assessment/Plan Active Problems:   SUBSTANCE ABUSE, MULTIPLE   ULCERATIVE COLITIS   Ulcerative colitis   Schizophrenia   Ulcerative Colitis: - Cont abd pain with GI bleeding and rebound tenderness on exam - Will order STAT ct abd/pelvis - GI consulted through the ED - Follow serial cbc and transfuse as needed - Continue on IV solumedrol at 45m q12hrs, dose adjustment per GI - Cont mesalamine suppository - Admit to med tele - NPO for now  Schizophrenia: - Hold zyprexa for now as pt is NPO - Stable  DVT prophylaxis: - SCD's given gi bleeding  Code Status: Full (must indicate code status--if unknown or must be presumed, indicate so) Family Communication: Pt in room (indicate person spoken with, if applicable, with phone number if by telephone) Disposition Plan: Pending (indicate anticipated LOS)  Time spent: 370m  [name] [facility] Pager [phone] 7PM-7AM, please contact night-coverage [email or web address] Password [email or web address]/13/2014, 6:34 PM

## 2013-04-28 LAB — COMPREHENSIVE METABOLIC PANEL
ALT: 109 U/L — ABNORMAL HIGH (ref 0–53)
AST: 40 U/L — ABNORMAL HIGH (ref 0–37)
Albumin: 2.7 g/dL — ABNORMAL LOW (ref 3.5–5.2)
CO2: 31 mEq/L (ref 19–32)
Calcium: 8.8 mg/dL (ref 8.4–10.5)
Creatinine, Ser: 0.77 mg/dL (ref 0.50–1.35)
Sodium: 136 mEq/L (ref 135–145)
Total Protein: 6.6 g/dL (ref 6.0–8.3)

## 2013-04-28 LAB — CBC
HCT: 39.4 % (ref 39.0–52.0)
Hemoglobin: 13.4 g/dL (ref 13.0–17.0)
MCH: 28.5 pg (ref 26.0–34.0)
MCHC: 34 g/dL (ref 30.0–36.0)
MCV: 83.8 fL (ref 78.0–100.0)
RBC: 4.7 MIL/uL (ref 4.22–5.81)

## 2013-04-28 MED ORDER — METHYLPREDNISOLONE SODIUM SUCC 40 MG IJ SOLR
40.0000 mg | Freq: Two times a day (BID) | INTRAMUSCULAR | Status: DC
  Administered 2013-04-28 – 2013-04-30 (×4): 40 mg via INTRAVENOUS
  Filled 2013-04-28 (×6): qty 1

## 2013-04-28 MED ORDER — ZOLPIDEM TARTRATE 5 MG PO TABS
5.0000 mg | ORAL_TABLET | Freq: Once | ORAL | Status: AC
  Administered 2013-04-28: 5 mg via ORAL
  Filled 2013-04-28: qty 1

## 2013-04-28 MED ORDER — ONDANSETRON HCL 4 MG PO TABS: 4.0000 mg | ORAL_TABLET | Freq: Four times a day (QID) | ORAL | Status: DC | PRN

## 2013-04-28 MED ORDER — ONDANSETRON HCL 4 MG/2ML IJ SOLN
4.0000 mg | Freq: Four times a day (QID) | INTRAMUSCULAR | Status: DC | PRN
  Administered 2013-04-28 – 2013-04-30 (×4): 4 mg via INTRAVENOUS
  Filled 2013-04-28 (×4): qty 2

## 2013-04-28 MED ORDER — HYDROMORPHONE HCL PF 1 MG/ML IJ SOLN
0.5000 mg | INTRAMUSCULAR | Status: DC | PRN
  Administered 2013-04-28 – 2013-04-29 (×5): 0.5 mg via INTRAVENOUS
  Filled 2013-04-28 (×5): qty 1

## 2013-04-28 NOTE — Progress Notes (Addendum)
[facility] PROGRESS NOTE  [name] VEL:[id number] DOB: Feb 06, 1981 DOA: 04/27/2013 PCP: Pcp Not In System  Assessment/Plan: Active Problems:   SUBSTANCE ABUSE, MULTIPLE   ULCERATIVE COLITIS   Ulcerative colitis   Schizophrenia     Ulcerative Colitis  He was initially treated with PO prednisone, PO mesalamine, and mesalamine suppository. Since the patient did not initially improve, he was changed to a higher dose of IV steroids, and a CT of the abdomen was obtained, which confirmed moderate left-sided colitis.  Stools less bloody but still having pain and nausea and vomiting.Contiunue with solumedrol, decrease to 30m BID Gastroenterology following   Hx of illicit drug use with positive marijuana and cocaine on recent admission  Code Status: full Family Communication: family updated about patient's clinical progress Disposition Plan:  As above    Brief narrative: [name] a 32y.o. male who was discharged several hours ago from the teaching service for symptomatic ulcerative colitis. See the discharge summary filed at 12:16pm today. Briefly, the patient was followed by GI, continued on steroids and mesalamine, ultimately requiring higher doses of steroids. Hgb remained relatively stable, and the patient was recommended to continue on oral steroids and mesalamine and follow up with GI as an outpatient. Regarding the patient's schizophrenia, he patient was also instructed to continue on zyprexa for schizophrenia per Psych recs. The patient reports being unable to tolerate PO food and meds and feeling as if he were discharged "too soon." Pt subsequently presented back to the ED a few hours later. The teaching service was initially consulted, however, the patient refused to see the teaching service and instead requested hospitalist service.   Consultants:  GI  Procedures:     Antibiotics:     HPI/Subjective: Patient felt he was discharged too soon because he  was unable to hold anything down prior to discharge   Objective: Filed Vitals:   04/27/13 1537 04/27/13 1952 04/28/13 0500 04/28/13 0946  BP: 117/73 132/82 108/54 144/88  Pulse: 116 92 83 87  Temp: 98.3 F (36.8 C) 98.6 F (37 C) 97.9 F (36.6 C) 98.1 F (36.7 C)  TempSrc: Oral Oral Oral Oral  Resp: 18 16 18 19   Height:  6' (1.829 m)    Weight:  82.4 kg (181 lb 10.5 oz) 82.3 kg (181 lb 7 oz)   SpO2: 100% 100% 99% 96%    Intake/Output Summary (Last 24 hours) at 04/28/13 1124 Last data filed at 04/28/13 0600  Gross per 24 hour  Intake  737.5 ml  Output      0 ml  Net  737.5 ml    Exam:  General: Awake, in nad  Eyes: PERRL B  ENT: membranes moist, dentition fair  Neck: trachea midline, neck supple  Cardiovascular: regular, s1, s2  Respiratory: normal resp effort, no wheezing  Abdomen: REBOUND TENDERNESS OVER R QUADRANTS, no organomegaly or masses  Skin: no abnormal skin lesions seen, normal skin turgor  Musculoskeletal: perfused, no clubbing or cyanosis  Psychiatric: normal  Neurologic: cn2-12 grossly in tact, strength and sensation intact    Data Reviewed: Basic Metabolic Panel:  Recent Labs Lab 04/24/13 1005 04/25/13 0555 04/26/13 0540 04/27/13 1551 04/28/13 0500  NA 140 139 140 137 136  K 3.6 4.3 4.5 3.9 4.5  CL 102 102 101 94* 96  CO2 28 27 33* 30 31  GLUCOSE 183* 134* 159* 236* 150*  BUN 9 9 14 23 18   CREATININE 0.73 0.66 0.66 0.95 0.77  CALCIUM 8.5 8.8  9.0 9.1 8.8    Liver Function Tests:  Recent Labs Lab 04/23/13 0503 04/27/13 1551 04/28/13 0500  AST 52* 97* 40*  ALT 29 157* 109*  ALKPHOS 61 126* 89  BILITOT 0.1* 0.2* 0.3  PROT 6.0 7.4 6.6  ALBUMIN 2.5* 3.1* 2.7*    Recent Labs Lab 04/27/13 1551  LIPASE 14   No results found for this basename: AMMONIA,  in the last 168 hours  CBC:  Recent Labs Lab 04/24/13 1005 04/25/13 0555 04/26/13 0540 04/27/13 1551 04/28/13 0500  WBC 13.7* 14.5* 15.8* 15.3* 12.2*  NEUTROABS  --    --   --  10.7*  --   HGB 11.5* 12.0* 12.6* 14.1 13.4  HCT 34.0* 35.4* 37.7* 41.0 39.4  MCV 84.2 84.3 85.1 84.4 83.8  PLT 385 449* 497* 597* 556*    Cardiac Enzymes: No results found for this basename: CKTOTAL, CKMB, CKMBINDEX, TROPONINI,  in the last 168 hours BNP (last 3 results) No results found for this basename: PROBNP,  in the last 8760 hours   CBG: No results found for this basename: GLUCAP,  in the last 168 hours  Recent Results (from the past 240 hour(s))  CLOSTRIDIUM DIFFICILE BY PCR     Status: None   Collection Time    04/21/13  4:09 PM      Result Value Range Status   C difficile by pcr NEGATIVE  NEGATIVE Final  STOOL CULTURE     Status: None   Collection Time    04/23/13  5:23 PM      Result Value Range Status   Specimen Description STOOL   Final   Special Requests Immunocompromised   Final   Culture     Final   Value: NO SALMONELLA, SHIGELLA, CAMPYLOBACTER, YERSINIA, OR E.COLI 0157:H7 ISOLATED   Report Status 04/27/2013 FINAL   Final  OVA AND PARASITE EXAMINATION     Status: None   Collection Time    04/23/13  5:23 PM      Result Value Range Status   Specimen Description STOOL   Final   Special Requests Immunocompromised   Final   Ova and parasites NO OVA OR PARASITES SEEN MODERATE YEAST   Final   Report Status 04/24/2013 FINAL   Final     Studies: Ct Abdomen Pelvis W Contrast  04/27/2013   *RADIOLOGY REPORT*  Clinical Data: Nausea, vomiting and diarrhea.  History of ulcerative colitis.  CT ABDOMEN AND PELVIS WITH CONTRAST  Technique:  Multidetector CT imaging of the abdomen and pelvis was performed following the standard protocol during bolus administration of intravenous contrast.  Contrast: 11m OMNIPAQUE IOHEXOL 300 MG/ML  SOLN  Comparison: CT abdomen and pelvis 04/23/2013.  Findings: The lung bases are clear.  No pleural or pericardial effusion.  As on prior study, there is some thickening of the walls of the descending colon through the rectosigmoid  consistent with colitis. Pericolonic inflammatory change about the descending colon appears slightly improved.  A large volume of stool is present in the ascending and transverse colon. No focal fluid collection or free intraperitoneal air is present.  The appendix appears normal.  The stomach and small bowel are unremarkable.  The liver, gallbladder, adrenal glands, spleen, pancreas, kidneys and biliary tree all appear normal.  Scattered small lymph nodes in the root of the mesentery and retroperitoneum are again seen measuring up to 1.1 cm short axis dimension in the left periaortic station on image 50.  No focal bony abnormality is  identified.  IMPRESSION:  1.  Changes of colitis involving the descending colon through the rectosigmoid persist but appear mildly improved since the study 4 days ago.  There is no abscess or other new abnormality. 2.  Small retroperitoneal and mesenteric lymph nodes are stable in appearance and likely reactive. 3.  Large stool burden ascending and proximal transverse colon.   Original Report Authenticated By: Orlean Patten, M.D.   Ct Abdomen Pelvis W Contrast  04/23/2013   *RADIOLOGY REPORT*  Clinical Data: Abdominal pain, left side greater than right. Bloody diarrhea.  Nausea vomiting.  Colitis.  CT ABDOMEN AND PELVIS WITH CONTRAST  Technique:  Multidetector CT imaging of the abdomen and pelvis was performed following the standard protocol during bolus administration of intravenous contrast.  Contrast: 125m OMNIPAQUE IOHEXOL 300 MG/ML  SOLN  Comparison: 12/01/2011  Findings: Moderate wall thickening is seen involving the descending and sigmoid portions of the colon which appears contiguous from the level of the splenic flexure to the rectum.  This is increased since previous study and is consistent with colitis.  There is no evidence of pneumatosis, abscess, or free fluid.  No evidence of bowel obstruction.  No evidence of bowel wall thickening involving the distal ileum or  small bowel.  Normal appendix is visualized.  The liver, spleen, pancreas, gallbladder, adrenal glands, and kidneys are normal in appearance.  No evidence of hydronephrosis. Retroaortic left renal vein is incidentally noted. Mild lymphadenopathy is again seen in the in the left para-aortic region, with largest measuring 11 mm in short axis. Mild lymphadenopathy is also seen in the sigmoid mesocolon.  These are stable since prior exam.  No other soft tissue masses are identified.  No suspicious bone lesions identified.  IMPRESSION:  1. Moderate left-sided colitis involving the descending and sigmoid colon, which is increased since previous study. Differential diagnosis includes ulcerative colitis and infectious colitis. 2.  Stable mild lymphadenopathy in the left para-aortic region and sigmoid mesocolon.  This is nonspecific, but may be reactive in etiology.   Original Report Authenticated By: JEarle Gell M.D.   Dg Abd 2 Views  04/20/2013   *RADIOLOGY REPORT*  Clinical Data: Abdominal pain  ABDOMEN - 2 VIEW  Comparison: 12/01/2011  Findings: The bowel gas pattern is nonobstructed.  Gas and stool noted throughout the colon up to the rectum.  No abnormal abdominal or pelvic calcifications.  IMPRESSION:  1.  Nonobstructive bowel gas pattern.   Original Report Authenticated By: TKerby Moors M.D.    Scheduled Meds: . mesalamine  1,000 mg Rectal BID  . methylPREDNISolone (SOLU-MEDROL) injection  40 mg Intravenous Q12H  . sodium chloride  3 mL Intravenous Q12H   Continuous Infusions: . sodium chloride 75 mL/hr at 04/27/13 2010    Active Problems:   SUBSTANCE ABUSE, MULTIPLE   ULCERATIVE COLITIS   Ulcerative colitis   Schizophrenia    Time spent: 40 minutes   [facility] Pager [phone] If 8PM-8AM, please contact night-coverage at [email or web address], password TCent[facility]7/14/2014, 11:24 AM  LOS: 1 day

## 2013-04-28 NOTE — Progress Notes (Signed)
Utilization review completed.  

## 2013-04-28 NOTE — Consult Note (Signed)
[facility] Consultation  Referring Provider:   Internal Medicine   Primary Care Physician:  Pcp Not In System Primary Gastroenterologist:   Unassigned     Reason for Consultation:   Ulcerative colitis           HPI:   [name] is a 32 y.o. male with ulcerative colitis, diagnosed in 2006 with pan colitis on colonoscopy done by Dr. Vladimir Faster. A flexible sigmoidoscopy in 2013([place]) revealed severe left sided UC. Patient was admitted on 04/20/13 with blood diarrhea and abdominal pain. C-diff was negative. CTscan revealed mainly left sided colitis. His Asacol was increased to 4.8 grams daily and he was started on Prednisone 38m bid. He had little improvement so prednisone was changed to IV solumedrol . Patient was discharged home on prednisone 266mbid and asacol. During that recent admission there were concerns about patient being unreliable historian.   Patient came back to ED several hours after he was discharged yesterday. Patient felt he was discharged too soon because he was unable to hold anything down prior to discharge. Based on abdominal exam a CTscan was obtained and actually showed improvement in colitis. He reports less blood in stools. He had 2 BMs during the night and 2 this am. Continues to have diffuse mid to lower abdominal pain.   Past Medical History  Diagnosis Date  . Ulcerative colitis   . Schizophrenia     Past Surgical History  Procedure Laterality Date  . Mandible fracture surgery    . Flexible sigmoidoscopy  12/05/2011    Procedure: FLEXIBLE SIGMOIDOSCOPY;  Surgeon: PaBeryle BeamsMD;  [facility];  Service: Endoscopy;  Laterality: N/A;    History  Substance Use Topics  . Smoking status: Current Every Day Smoker -- 0.25 packs/day for 20 years    Types: Cigarettes  . Smokeless tobacco: Not on file  . Alcohol Use: Yes     Comment: occasional   FMH:  HTN. No IBD  PTA meds: just discharged a few hours prior to being readmitted. Refer  to discharge summary    Current Facility-Administered Medications  Medication Dose Route Frequency Provider Last Rate Last Dose  . 0.9 %  sodium chloride infusion   Intravenous Continuous StDonne HazelMD 75 mL/hr at 04/27/13 2010    . acetaminophen (TYLENOL) tablet 650 mg  650 mg Oral Q6H PRN StDonne HazelMD       Or  . acetaminophen (TYLENOL) suppository 650 mg  650 mg Rectal Q6H PRN StDonne HazelMD      . mesalamine (CANASA) suppository 1,000 mg  1,000 mg Rectal BID StDonne HazelMD   1,000 mg at 04/27/13 2110  . methylPREDNISolone sodium succinate (SOLU-MEDROL) 125 mg/2 mL injection 60 mg  60 mg Intravenous Q12H StDonne HazelMD   60 mg at 04/28/13 0851  . morphine 2 MG/ML injection 2 mg  2 mg Intravenous Q4H PRN StDonne HazelMD   2 mg at 04/28/13 0955  . ondansetron (ZOFRAN) tablet 4 mg  4 mg Oral Q6H PRN Randall M Reidler, PA-C       Or  . ondansetron (ZOFRAN) injection 4 mg  4 mg Intravenous Q6H PRN RaJerrye Bushyeidler, PA-C   4 mg at 04/28/13 0408  . sodium chloride 0.9 % injection 3 mL  3 mL Intravenous Q12H StDonne HazelMD       Facility-Administered Medications Ordered in Other Encounters  Medication Dose Route Frequency Provider Last Rate  Last Dose  . hydrOXYzine (ATARAX/VISTARIL) tablet 50 mg  50 mg Oral TID PRN Encarnacion Slates, NP      . magnesium hydroxide (MILK OF MAGNESIA) suspension 30 mL  30 mL Oral Daily PRN Encarnacion Slates, NP        Allergies as of 04/27/2013 - Review Complete 04/27/2013  Allergen Reaction Noted  . Depakote (divalproex sodium) Other (See Comments) 06/17/2012  . Lithium Other (See Comments) 10/21/2012  . Nsaids Other (See Comments) 10/21/2012   Review of Systems:    All systems reviewed and negative except where noted in HPI.   Physical Exam:  Vital signs in last 24 hours: Temp:  [97.9 F (36.6 C)-98.6 F (37 C)] 98.1 F (36.7 C) (07/14 0946) Pulse Rate:  [83-116] 87 (07/14 0946) Resp:  [16-19] 19 (07/14 0946) BP:  (108-144)/(54-88) 144/88 mmHg (07/14 0946) SpO2:  [96 %-100 %] 96 % (07/14 0946) Weight:  [181 lb 7 oz (82.3 kg)-181 lb 10.5 oz (82.4 kg)] 181 lb 7 oz (82.3 kg) (07/14 0500) Last BM Date: 04/27/13 General:   Black male in NAD Head:  Normocephalic and atraumatic. Eyes:   No icterus.   Conjunctiva pink. Ears:  Normal auditory acuity. Neck:  Supple; no masses felt Lungs:  Respirations even and unlabored. Lungs clear to auscultation bilaterally.   No wheezes, crackles, or rhonchi.  Heart:  Regular rate and rhythm;  murmur heard. Abdomen:  Soft, nondistended, mild diffuse lower abdominal tenderness.  Normal bowel sounds. No appreciable masses or hepatomegaly.  Rectal:  Not performed.  Msk:  Symmetrical without gross deformities.  Extremities:  Without edema. Neurologic:  Alert and  oriented x4;  grossly normal neurologically. Skin:  Intact without significant lesions or rashes. Cervical Nodes:  No significant cervical adenopathy. Psych:  Alert and cooperative. Marland Kitchen  LAB RESULTS:  Recent Labs  04/26/13 0540 04/27/13 1551 04/28/13 0500  WBC 15.8* 15.3* 12.2*  HGB 12.6* 14.1 13.4  HCT 37.7* 41.0 39.4  PLT 497* 597* 556*   BMET  Recent Labs  04/26/13 0540 04/27/13 1551 04/28/13 0500  NA 140 137 136  K 4.5 3.9 4.5  CL 101 94* 96  CO2 33* 30 31  GLUCOSE 159* 236* 150*  BUN 14 23 18   CREATININE 0.66 0.95 0.77  CALCIUM 9.0 9.1 8.8   LFT  Recent Labs  04/28/13 0500  PROT 6.6  ALBUMIN 2.7*  AST 40*  ALT 109*  ALKPHOS 89  BILITOT 0.3    STUDIES: Ct Abdomen Pelvis W Contrast  04/27/2013   *RADIOLOGY REPORT*  Clinical Data: Nausea, vomiting and diarrhea.  History of ulcerative colitis.  CT ABDOMEN AND PELVIS WITH CONTRAST  Technique:  Multidetector CT imaging of the abdomen and pelvis was performed following the standard protocol during bolus administration of intravenous contrast.  Contrast: 147m OMNIPAQUE IOHEXOL 300 MG/ML  SOLN  Comparison: CT abdomen and pelvis  04/23/2013.  Findings: The lung bases are clear.  No pleural or pericardial effusion.  As on prior study, there is some thickening of the walls of the descending colon through the rectosigmoid consistent with colitis. Pericolonic inflammatory change about the descending colon appears slightly improved.  A large volume of stool is present in the ascending and transverse colon. No focal fluid collection or free intraperitoneal air is present.  The appendix appears normal.  The stomach and small bowel are unremarkable.  The liver, gallbladder, adrenal glands, spleen, pancreas, kidneys and biliary tree all appear normal.  Scattered small lymph nodes in  the root of the mesentery and retroperitoneum are again seen measuring up to 1.1 cm short axis dimension in the left periaortic station on image 50.  No focal bony abnormality is identified.  IMPRESSION:  1.  Changes of colitis involving the descending colon through the rectosigmoid persist but appear mildly improved since the study 4 days ago.  There is no abscess or other new abnormality. 2.  Small retroperitoneal and mesenteric lymph nodes are stable in appearance and likely reactive. 3.  Large stool burden ascending and proximal transverse colon.   Original Report Authenticated By: Orlean Patten, M.D.    PREVIOUS ENDOSCOPIES:            flex sig 12/05/11 [place]). Findings: severe colitis was identified from the dentate line up to the splenic flexure region. Cold biopsies were obtained. Proximal to the splenic flexure the colonic mucosal was normal. No other abnormalities identified. Retroflexed views in the rectum revealed medium hemorrhoids.    Impression / Plan:   1. Ulcerative colitis, apparently with pancolitis upon diagnosis several years ago but left sided colitis on last flex sig Feb 2013.  Patient just released after one week stay here for UC flare. Readmitted within a few hours. His WBC is down to 12.2. Stools less bloody but still having pain and  nausea and vomiting. Agree with solumedrol, decrease to 39m BID. He wants to try fluids. Restart home asacol 4.8gms daily if tolerates sips. Continue BID mesalamine suppositories. Etiology of nausea / vomiting unclear, maybe secondary to pain meds. Continue Zofran. Reglan is option but ?good idea in patient with schizophrenia.   2. Constipation by CTscan. Large volume of stool in ascending and proximal transverse colon. If tolerates clears then will try BD Miralax  3. Schizophrenia  4. Hx of illicit drug use with positive marijuana and cocaine on recent admission  Thanks   LOS: 1 day   PTye Savoy 04/28/2013, 9:57 AM  Chart was reviewed and patient was examined. X-rays and lab were reviewed.    I agree with management and plans. From a clinical standpoint I think the patient's colitis is improved. His complaints of pain seem out of proportion to his exam. Nausea and vomiting may be related to narcotics. Recommend minimizing narcotics. May try tramadol for pain.  RSandy Salaam KDeatra Ina M.D., FUniversity Medical Center Of Southern NevadaGastroenterology Cell [phone]

## 2013-04-29 MED ORDER — HYDROMORPHONE HCL PF 1 MG/ML IJ SOLN
1.0000 mg | INTRAMUSCULAR | Status: DC | PRN
  Administered 2013-04-29: 2 mg via INTRAVENOUS
  Administered 2013-04-30: 1 mg via INTRAVENOUS
  Administered 2013-04-30: 2 mg via INTRAVENOUS
  Administered 2013-04-30: 1 mg via INTRAVENOUS
  Administered 2013-04-30: 2 mg via INTRAVENOUS
  Filled 2013-04-29 (×2): qty 1
  Filled 2013-04-29 (×3): qty 2

## 2013-04-29 MED ORDER — METOCLOPRAMIDE HCL 5 MG PO TABS
5.0000 mg | ORAL_TABLET | Freq: Three times a day (TID) | ORAL | Status: DC
  Administered 2013-04-30 – 2013-05-02 (×7): 5 mg via ORAL
  Filled 2013-04-29 (×10): qty 1

## 2013-04-29 MED ORDER — HYDROMORPHONE HCL PF 1 MG/ML IJ SOLN
1.0000 mg | Freq: Once | INTRAMUSCULAR | Status: AC
  Administered 2013-04-29: 1 mg via INTRAVENOUS
  Filled 2013-04-29: qty 1

## 2013-04-29 MED ORDER — POLYETHYLENE GLYCOL 3350 17 G PO PACK
17.0000 g | PACK | Freq: Two times a day (BID) | ORAL | Status: DC
  Administered 2013-04-29 – 2013-04-30 (×3): 17 g via ORAL
  Filled 2013-04-29 (×4): qty 1

## 2013-04-29 NOTE — Progress Notes (Addendum)
[facility] Progress Note   Subjective  still with abdominal pain, some nausea.    Objective   Vital signs in last 24 hours: Temp:  [98.2 F (36.8 C)-98.7 F (37.1 C)] 98.7 F (37.1 C) (07/15 0544) Pulse Rate:  [78-94] 82 (07/15 0544) Resp:  [18-19] 18 (07/15 0544) BP: (124-139)/(78-88) 129/84 mmHg (07/15 0544) SpO2:  [97 %-100 %] 97 % (07/15 0544) Last BM Date: 04/29/13 General:    Black male in NAD Heart:  Regular rate and rhythm; no murmurs Lungs: Respirations even and unlabored, lungs CTA bilaterally Abdomen:  Soft, nondistended, mild diffuse lower abdominal tenderness.  Normal bowel sounds. Extremities:  Without edema. Neurologic:  Alert and oriented,  grossly normal neurologically. Psych:  Cooperative. Normal mood and affect.   Lab Results:  Recent Labs  04/27/13 1551 04/28/13 0500  WBC 15.3* 12.2*  HGB 14.1 13.4  HCT 41.0 39.4  PLT 597* 556*   BMET  Recent Labs  04/27/13 1551 04/28/13 0500  NA 137 136  K 3.9 4.5  CL 94* 96  CO2 30 31  GLUCOSE 236* 150*  BUN 23 18  CREATININE 0.95 0.77  CALCIUM 9.1 8.8   LFT  Recent Labs  04/28/13 0500  PROT 6.6  ALBUMIN 2.7*  AST 40*  ALT 109*  ALKPHOS 89  BILITOT 0.3   Studies/Results: Ct Abdomen Pelvis W Contrast  04/27/2013   *RADIOLOGY REPORT*  Clinical Data: Nausea, vomiting and diarrhea.  History of ulcerative colitis.  CT ABDOMEN AND PELVIS WITH CONTRAST  Technique:  Multidetector CT imaging of the abdomen and pelvis was performed following the standard protocol during bolus administration of intravenous contrast.  Contrast: 161m OMNIPAQUE IOHEXOL 300 MG/ML  SOLN  Comparison: CT abdomen and pelvis 04/23/2013.  Findings: The lung bases are clear.  No pleural or pericardial effusion.  As on prior study, there is some thickening of the walls of the descending colon through the rectosigmoid consistent with colitis. Pericolonic inflammatory change about the descending colon appears slightly  improved.  A large volume of stool is present in the ascending and transverse colon. No focal fluid collection or free intraperitoneal air is present.  The appendix appears normal.  The stomach and small bowel are unremarkable.  The liver, gallbladder, adrenal glands, spleen, pancreas, kidneys and biliary tree all appear normal.  Scattered small lymph nodes in the root of the mesentery and retroperitoneum are again seen measuring up to 1.1 cm short axis dimension in the left periaortic station on image 50.  No focal bony abnormality is identified.  IMPRESSION:  1.  Changes of colitis involving the descending colon through the rectosigmoid persist but appear mildly improved since the study 4 days ago.  There is no abscess or other new abnormality. 2.  Small retroperitoneal and mesenteric lymph nodes are stable in appearance and likely reactive. 3.  Large stool burden ascending and proximal transverse colon.   Original Report Authenticated By: TOrlean Patten M.D.       Assessment / Plan:   1. Ulcerative colitis. Given clears yesterday. start home asacol 4.8gms daily.  Continue BID mesalamine suppositories. Etiology of nausea / vomiting unclear, maybe secondary to pain meds. Continue Zofran. :Hopefully home soon   2. Constipation by CTscan. Large volume of stool in ascending and proximal transverse colon. Begin Miralax 3. Schizophrenia   4. Hx of illicit drug use with positive marijuana and cocaine on recent admission  5. Elevated LFTs, improving.    LOS: 2 days   PTye Savoy  04/29/2013, 10:23 AM   I have personally taken an interval history, reviewed the chart, and examined the patient.  I agree with the extender's note, impression and recommendations.  Sandy Salaam. Deatra Ina, MD, [facility] [phone]

## 2013-04-29 NOTE — Progress Notes (Signed)
[facility] PROGRESS NOTE  [name] EPP:[id number] DOB: 27-Jan-1981 DOA: 04/27/2013 PCP: Pcp Not In System  Assessment/Plan: Active Problems:   SUBSTANCE ABUSE, MULTIPLE   ULCERATIVE COLITIS   Ulcerative colitis   Schizophrenia   Ulcerative Colitis  He was initially treated with PO prednisone, PO mesalamine, and mesalamine suppository. Since the patient did not initially improve and following GI recommendations has been change to solumedrol, BID mesalamine suppositories and home asacol. -CT of the abdomen was obtained, which confirmed moderate left-sided colitis and significant constipation. -started on miralax BID -due to ongoing nausea, GI has recommended to start reglan TId  -Stools less bloody but still having pain and nausea and vomiting. -pain medication adjusted for better control of his symptoms. -Gastroenterology following  Nausea/vomiting: continue zofran. -will start reglan  Constipation: seen on CT  -will start miralax.  Hx of illicit drug use with positive marijuana and cocaine on recent admission: -counseling provided.  Code Status: full Family Communication: No family at bedside Disposition Plan:  As above    Brief narrative: [name] is a 32 y.o. male who was discharged several hours ago from the teaching service for symptomatic ulcerative colitis. See the discharge summary filed at 12:16pm today. Briefly, the patient was followed by GI, continued on steroids and mesalamine, ultimately requiring higher doses of steroids. Hgb remained relatively stable, and the patient was recommended to continue on oral steroids and mesalamine and follow up with GI as an outpatient. Regarding the patient's schizophrenia, he patient was also instructed to continue on zyprexa for schizophrenia per Psych recs. The patient reports being unable to tolerate PO food and meds and feeling as if he were discharged "too soon." Pt subsequently presented back to the ED a few  hours later. The teaching service was initially consulted, however, the patient refused to see the teaching service and instead requested hospitalist service.   Consultants:  GI  Procedures:   none  Antibiotics:   none  HPI/Subjective: Patient still complaining of nausea and abdominal pain. Able to tolerates CLD.  Objective: Filed Vitals:   04/28/13 2046 04/29/13 0544 04/29/13 1000 04/29/13 1400  BP: 139/88 129/84 124/80 132/82  Pulse: 91 82 80 78  Temp: 98.7 F (37.1 C) 98.7 F (37.1 C) 98 F (36.7 C) 98.6 F (37 C)  TempSrc: Oral Oral Oral Oral  Resp: 18 18 18 18   Height:      Weight:      SpO2: 100% 97% 97% 98%    Intake/Output Summary (Last 24 hours) at 04/29/13 1653 Last data filed at 04/29/13 1302  Gross per 24 hour  Intake   1120 ml  Output      0 ml  Net   1120 ml    Exam:  General: Awake, afebrile; complaining of abd painn nad  Eyes: PERRL, EOMI, no icterus  ENT: membranes moist, dentition fair  Cardiovascular: regular, s1, s2; no rubs or gallops Respiratory: normal resp effort, no wheezing  Abdomen: REBOUND TENDERNESS OVER R lower QUADRANT , no organomegaly or masses  Skin: no abnormal skin lesions seen, normal skin turgor  Musculoskeletal: perfused, no clubbing or cyanosis  Psychiatric: normal  Neurologic: cn2-12 grossly in tact, strength and sensation intact    Data Reviewed: Basic Metabolic Panel:  Recent Labs Lab 04/24/13 1005 04/25/13 0555 04/26/13 0540 04/27/13 1551 04/28/13 0500  NA 140 139 140 137 136  K 3.6 4.3 4.5 3.9 4.5  CL 102 102 101 94* 96  CO2 28 27 33* 30 31  GLUCOSE 183* 134* 159* 236* 150*  BUN 9 9 14 23 18   CREATININE 0.73 0.66 0.66 0.95 0.77  CALCIUM 8.5 8.8 9.0 9.1 8.8    Liver Function Tests:  Recent Labs Lab 04/23/13 0503 04/27/13 1551 04/28/13 0500  AST 52* 97* 40*  ALT 29 157* 109*  ALKPHOS 61 126* 89  BILITOT 0.1* 0.2* 0.3  PROT 6.0 7.4 6.6  ALBUMIN 2.5* 3.1* 2.7*    Recent Labs Lab  04/27/13 1551  LIPASE 14   CBC:  Recent Labs Lab 04/24/13 1005 04/25/13 0555 04/26/13 0540 04/27/13 1551 04/28/13 0500  WBC 13.7* 14.5* 15.8* 15.3* 12.2*  NEUTROABS  --   --   --  10.7*  --   HGB 11.5* 12.0* 12.6* 14.1 13.4  HCT 34.0* 35.4* 37.7* 41.0 39.4  MCV 84.2 84.3 85.1 84.4 83.8  PLT 385 449* 497* 597* 556*    Recent Results (from the past 240 hour(s))  CLOSTRIDIUM DIFFICILE BY PCR     Status: None   Collection Time    04/21/13  4:09 PM      Result Value Range Status   C difficile by pcr NEGATIVE  NEGATIVE Final  STOOL CULTURE     Status: None   Collection Time    04/23/13  5:23 PM      Result Value Range Status   Specimen Description STOOL   Final   Special Requests Immunocompromised   Final   Culture     Final   Value: NO SALMONELLA, SHIGELLA, CAMPYLOBACTER, YERSINIA, OR E.COLI 0157:H7 ISOLATED   Report Status 04/27/2013 FINAL   Final  OVA AND PARASITE EXAMINATION     Status: None   Collection Time    04/23/13  5:23 PM      Result Value Range Status   Specimen Description STOOL   Final   Special Requests Immunocompromised   Final   Ova and parasites NO OVA OR PARASITES SEEN MODERATE YEAST   Final   Report Status 04/24/2013 FINAL   Final     Studies: Ct Abdomen Pelvis W Contrast  04/27/2013   *RADIOLOGY REPORT*  Clinical Data: Nausea, vomiting and diarrhea.  History of ulcerative colitis.  CT ABDOMEN AND PELVIS WITH CONTRAST  Technique:  Multidetector CT imaging of the abdomen and pelvis was performed following the standard protocol during bolus administration of intravenous contrast.  Contrast: 166m OMNIPAQUE IOHEXOL 300 MG/ML  SOLN  Comparison: CT abdomen and pelvis 04/23/2013.  Findings: The lung bases are clear.  No pleural or pericardial effusion.  As on prior study, there is some thickening of the walls of the descending colon through the rectosigmoid consistent with colitis. Pericolonic inflammatory change about the descending colon appears slightly  improved.  A large volume of stool is present in the ascending and transverse colon. No focal fluid collection or free intraperitoneal air is present.  The appendix appears normal.  The stomach and small bowel are unremarkable.  The liver, gallbladder, adrenal glands, spleen, pancreas, kidneys and biliary tree all appear normal.  Scattered small lymph nodes in the root of the mesentery and retroperitoneum are again seen measuring up to 1.1 cm short axis dimension in the left periaortic station on image 50.  No focal bony abnormality is identified.  IMPRESSION:  1.  Changes of colitis involving the descending colon through the rectosigmoid persist but appear mildly improved since the study 4 days ago.  There is no abscess or other new abnormality. 2.  Small retroperitoneal and mesenteric  lymph nodes are stable in appearance and likely reactive. 3.  Large stool burden ascending and proximal transverse colon.   Original Report Authenticated By: Orlean Patten, M.D.   Ct Abdomen Pelvis W Contrast  04/23/2013   *RADIOLOGY REPORT*  Clinical Data: Abdominal pain, left side greater than right. Bloody diarrhea.  Nausea vomiting.  Colitis.  CT ABDOMEN AND PELVIS WITH CONTRAST  Technique:  Multidetector CT imaging of the abdomen and pelvis was performed following the standard protocol during bolus administration of intravenous contrast.  Contrast: 180m OMNIPAQUE IOHEXOL 300 MG/ML  SOLN  Comparison: 12/01/2011  Findings: Moderate wall thickening is seen involving the descending and sigmoid portions of the colon which appears contiguous from the level of the splenic flexure to the rectum.  This is increased since previous study and is consistent with colitis.  There is no evidence of pneumatosis, abscess, or free fluid.  No evidence of bowel obstruction.  No evidence of bowel wall thickening involving the distal ileum or small bowel.  Normal appendix is visualized.  The liver, spleen, pancreas, gallbladder, adrenal glands,  and kidneys are normal in appearance.  No evidence of hydronephrosis. Retroaortic left renal vein is incidentally noted. Mild lymphadenopathy is again seen in the in the left para-aortic region, with largest measuring 11 mm in short axis. Mild lymphadenopathy is also seen in the sigmoid mesocolon.  These are stable since prior exam.  No other soft tissue masses are identified.  No suspicious bone lesions identified.  IMPRESSION:  1. Moderate left-sided colitis involving the descending and sigmoid colon, which is increased since previous study. Differential diagnosis includes ulcerative colitis and infectious colitis. 2.  Stable mild lymphadenopathy in the left para-aortic region and sigmoid mesocolon.  This is nonspecific, but may be reactive in etiology.   Original Report Authenticated By: JEarle Gell M.D.   Dg Abd 2 Views  04/20/2013   *RADIOLOGY REPORT*  Clinical Data: Abdominal pain  ABDOMEN - 2 VIEW  Comparison: 12/01/2011  Findings: The bowel gas pattern is nonobstructed.  Gas and stool noted throughout the colon up to the rectum.  No abnormal abdominal or pelvic calcifications.  IMPRESSION:  1.  Nonobstructive bowel gas pattern.   Original Report Authenticated By: TKerby Moors M.D.    Scheduled Meds: . mesalamine  1,000 mg Rectal BID  . methylPREDNISolone (SOLU-MEDROL) injection  40 mg Intravenous Q12H  . [START ON 04/30/2013] metoCLOPramide  5 mg Oral TID AC  . polyethylene glycol  17 g Oral BID  . sodium chloride  3 mL Intravenous Q12H   Continuous Infusions: . sodium chloride 75 mL/hr at 04/29/13 1302    Active Problems:   SUBSTANCE ABUSE, MULTIPLE   ULCERATIVE COLITIS   Ulcerative colitis   Schizophrenia    Time spent: 30 minutes   Wilbur Oakland  [facility] Pager [phone] If 8PM-8AM, please contact night-coverage at [email or web address], password [email or web address] Of Spokane7/15/2014, 4:53 PM  LOS: 2 days

## 2013-04-29 NOTE — Progress Notes (Signed)
Pt asleep.  I'm not aware that he's actively stooling.  Remains on solumedrol. Recommend switching to prednisone 30m bid, d/cing solumedrol and narcotics. If he is indeed improving may advance to full liquids.

## 2013-04-30 LAB — BASIC METABOLIC PANEL WITH GFR
BUN: 17 mg/dL (ref 6–23)
CO2: 31 meq/L (ref 19–32)
Calcium: 9 mg/dL (ref 8.4–10.5)
Chloride: 95 meq/L — ABNORMAL LOW (ref 96–112)
Creatinine, Ser: 0.78 mg/dL (ref 0.50–1.35)
GFR calc Af Amer: >90 mL/min (ref >90)
GFR calc non Af Amer: >90 mL/min (ref >90)
Glucose, Bld: 124 mg/dL — ABNORMAL HIGH (ref 70–99)
Potassium: 4 meq/L (ref 3.5–5.1)
Sodium: 135 meq/L (ref 135–145)

## 2013-04-30 MED ORDER — PREDNISONE 20 MG PO TABS
20.0000 mg | ORAL_TABLET | Freq: Two times a day (BID) | ORAL | Status: DC
  Administered 2013-04-30 – 2013-05-02 (×4): 20 mg via ORAL
  Filled 2013-04-30 (×6): qty 1

## 2013-04-30 MED ORDER — MESALAMINE 400 MG PO CPDR
800.0000 mg | DELAYED_RELEASE_CAPSULE | Freq: Three times a day (TID) | ORAL | Status: DC
  Administered 2013-04-30 – 2013-05-02 (×6): 800 mg via ORAL
  Filled 2013-04-30 (×8): qty 2

## 2013-04-30 MED ORDER — HYDROMORPHONE HCL PF 1 MG/ML IJ SOLN
1.0000 mg | INTRAMUSCULAR | Status: DC | PRN
  Administered 2013-04-30 – 2013-05-01 (×3): 1 mg via INTRAVENOUS
  Filled 2013-04-30 (×3): qty 1

## 2013-04-30 MED ORDER — TRAMADOL HCL 50 MG PO TABS
100.0000 mg | ORAL_TABLET | Freq: Four times a day (QID) | ORAL | Status: DC | PRN
  Administered 2013-05-01 (×2): 100 mg via ORAL
  Filled 2013-04-30 (×3): qty 2

## 2013-04-30 MED ORDER — ONDANSETRON 8 MG/NS 50 ML IVPB
8.0000 mg | Freq: Four times a day (QID) | INTRAVENOUS | Status: DC | PRN
  Administered 2013-04-30 – 2013-05-01 (×2): 8 mg via INTRAVENOUS
  Filled 2013-04-30 (×3): qty 8

## 2013-04-30 MED ORDER — POLYETHYLENE GLYCOL 3350 17 G PO PACK
17.0000 g | PACK | Freq: Every day | ORAL | Status: DC
  Administered 2013-05-01 – 2013-05-02 (×2): 17 g via ORAL
  Filled 2013-04-30 (×2): qty 1

## 2013-04-30 MED ORDER — MESALAMINE 400 MG PO TBEC: 800.0000 mg | DELAYED_RELEASE_TABLET | Freq: Three times a day (TID) | ORAL | Status: DC

## 2013-04-30 MED ORDER — SIMETHICONE 80 MG PO CHEW
160.0000 mg | CHEWABLE_TABLET | Freq: Four times a day (QID) | ORAL | Status: DC | PRN
  Administered 2013-05-01: 160 mg via ORAL
  Filled 2013-04-30: qty 2

## 2013-04-30 MED ORDER — ONDANSETRON HCL 4 MG PO TABS
8.0000 mg | ORAL_TABLET | Freq: Four times a day (QID) | ORAL | Status: DC | PRN
  Administered 2013-05-01 – 2013-05-02 (×2): 8 mg via ORAL
  Filled 2013-04-30 (×2): qty 1
  Filled 2013-04-30: qty 2

## 2013-04-30 MED ORDER — OXYCODONE-ACETAMINOPHEN 5-325 MG PO TABS
1.0000 | ORAL_TABLET | ORAL | Status: DC | PRN
  Administered 2013-04-30: 1 via ORAL
  Administered 2013-04-30 – 2013-05-01 (×2): 2 via ORAL
  Administered 2013-05-01 – 2013-05-02 (×2): 1 via ORAL
  Filled 2013-04-30 (×2): qty 2
  Filled 2013-04-30: qty 1
  Filled 2013-04-30 (×3): qty 2

## 2013-04-30 NOTE — Progress Notes (Signed)
[facility] PROGRESS NOTE  [name] OVZ:[id number] DOB: Sep 25, 1981 DOA: 04/27/2013 PCP: Pcp Not In System  Assessment/Plan: Active Problems:   SUBSTANCE ABUSE, MULTIPLE   ULCERATIVE COLITIS   Ulcerative colitis   Schizophrenia   Ulcerative Colitis with flare.  He was initially treated with PO prednisone, PO mesalamine, and mesalamine suppository and failed treatment so was transitioned to solumedrol and BID mesalamine suppositories on 7/14. - Appreciate gastroenterology assistance  - CT of the abdomen demonstrated moderate left-sided colitis and significant constipation. - Transitioned to prednisone today by gastroenterology - Advance diet - Encourage oral pain medication - For mild pain, Tylenol; moderate pain, Ultram; severe pain, Percocet; refractory pain, dilaudid IV  Nausea/vomiting:  -  Increase zofran. - Consider increasing reglan  Constipation: seen on CT and now resolved.  - MiraLax reduced  Hx of illicit drug use with positive marijuana and cocaine on recent admission: -counseling provided.  Diet: Full liquid IVF:  NS at 100m/h PROPH:  SCDs.  Transition to subcutaneous Lovenox or heparin as soon as he no longer has signs of active bleeding as he is higher risk for DVT in the setting of inflammatory bowel disease.  Code Status: full Family Communication: No family at bedside Disposition Plan:  As above    Brief narrative: Keith Bluesteinis a 32y.o. male who was discharged several hours ago from the teaching service for symptomatic ulcerative colitis. See the discharge summary filed at 12:16pm today. Briefly, the patient was followed by GI, continued on steroids and mesalamine, ultimately requiring higher doses of steroids. Hgb remained relatively stable, and the patient was recommended to continue on oral steroids and mesalamine and follow up with GI as an outpatient. Regarding the patient's schizophrenia, he patient was also instructed to continue on  zyprexa for schizophrenia per Psych recs. The patient reports being unable to tolerate PO food and meds and feeling as if he were discharged "too soon." Pt subsequently presented back to the ED a few hours later. The teaching service was initially consulted, however, the patient refused to see the teaching service and instead requested hospitalist service.   Consultants:  GI  Procedures:   none  Antibiotics:   none  HPI/Subjective: Patient states that his abdominal pain has improved somewhat. He had several bowel movements overnight and is now having some diarrhea. He denies any blood or mucus in the stool this morning. He states that he is unable to take a pill today which he vomited afterwards the patella remains down. He would like to eat  Objective: Filed Vitals:   04/29/13 2111 04/30/13 0551 04/30/13 0955 04/30/13 1400  BP: 131/74 120/60 123/72 126/70  Pulse: 77 67 79 81  Temp: 99 F (37.2 C) 98.7 F (37.1 C) 98 F (36.7 C) 97.8 F (36.6 C)  TempSrc: Oral Oral Oral Oral  Resp: 18 18 18 18   Height:      Weight: 83.961 kg (185 lb 1.6 oz)     SpO2: 96% 95% 96% 98%    Intake/Output Summary (Last 24 hours) at 04/30/13 1607 Last data filed at 04/30/13 1100  Gross per 24 hour  Intake 2187.5 ml  Output      0 ml  Net 2187.5 ml    Exam:  General:  African American male, NAD Eyes: PERRL, EOMI, no icterus  ENT: membranes moist, dentition fair  Cardiovascular: RRR, s1, s2; no murmurs, rubs or gallops Respiratory: normal resp effort, CTAB  Abdomen:  Hypoactive bowel sounds, soft, nondistended, tender to palpation  particularly in the left lower quadrant without rebound or guarding. Skin: no abnormal skin lesions seen, normal skin turgor  Musculoskeletal: perfused, no clubbing or cyanosis  Psychiatric: normal  Neurologic: Grossly intact     Data Reviewed: Basic Metabolic Panel:  Recent Labs Lab 04/25/13 0555 04/26/13 0540 04/27/13 1551 04/28/13 0500  04/30/13 0545  NA 139 140 137 136 135  K 4.3 4.5 3.9 4.5 4.0  CL 102 101 94* 96 95*  CO2 27 33* 30 31 31   GLUCOSE 134* 159* 236* 150* 124*  BUN 9 14 23 18 17   CREATININE 0.66 0.66 0.95 0.77 0.78  CALCIUM 8.8 9.0 9.1 8.8 9.0    Liver Function Tests:  Recent Labs Lab 04/27/13 1551 04/28/13 0500  AST 97* 40*  ALT 157* 109*  ALKPHOS 126* 89  BILITOT 0.2* 0.3  PROT 7.4 6.6  ALBUMIN 3.1* 2.7*    Recent Labs Lab 04/27/13 1551  LIPASE 14   CBC:  Recent Labs Lab 04/24/13 1005 04/25/13 0555 04/26/13 0540 04/27/13 1551 04/28/13 0500  WBC 13.7* 14.5* 15.8* 15.3* 12.2*  NEUTROABS  --   --   --  10.7*  --   HGB 11.5* 12.0* 12.6* 14.1 13.4  HCT 34.0* 35.4* 37.7* 41.0 39.4  MCV 84.2 84.3 85.1 84.4 83.8  PLT 385 449* 497* 597* 556*    Recent Results (from the past 240 hour(s))  CLOSTRIDIUM DIFFICILE BY PCR     Status: None   Collection Time    04/21/13  4:09 PM      Result Value Range Status   C difficile by pcr NEGATIVE  NEGATIVE Final  STOOL CULTURE     Status: None   Collection Time    04/23/13  5:23 PM      Result Value Range Status   Specimen Description STOOL   Final   Special Requests Immunocompromised   Final   Culture     Final   Value: NO SALMONELLA, SHIGELLA, CAMPYLOBACTER, YERSINIA, OR E.COLI 0157:H7 ISOLATED   Report Status 04/27/2013 FINAL   Final  OVA AND PARASITE EXAMINATION     Status: None   Collection Time    04/23/13  5:23 PM      Result Value Range Status   Specimen Description STOOL   Final   Special Requests Immunocompromised   Final   Ova and parasites NO OVA OR PARASITES SEEN MODERATE YEAST   Final   Report Status 04/24/2013 FINAL   Final     Studies: Ct Abdomen Pelvis W Contrast  04/27/2013   *RADIOLOGY REPORT*  Clinical Data: Nausea, vomiting and diarrhea.  History of ulcerative colitis.  CT ABDOMEN AND PELVIS WITH CONTRAST  Technique:  Multidetector CT imaging of the abdomen and pelvis was performed following the standard protocol  during bolus administration of intravenous contrast.  Contrast: 142m OMNIPAQUE IOHEXOL 300 MG/ML  SOLN  Comparison: CT abdomen and pelvis 04/23/2013.  Findings: The lung bases are clear.  No pleural or pericardial effusion.  As on prior study, there is some thickening of the walls of the descending colon through the rectosigmoid consistent with colitis. Pericolonic inflammatory change about the descending colon appears slightly improved.  A large volume of stool is present in the ascending and transverse colon. No focal fluid collection or free intraperitoneal air is present.  The appendix appears normal.  The stomach and small bowel are unremarkable.  The liver, gallbladder, adrenal glands, spleen, pancreas, kidneys and biliary tree all appear normal.  Scattered small  lymph nodes in the root of the mesentery and retroperitoneum are again seen measuring up to 1.1 cm Ines Rebel axis dimension in the left periaortic station on image 50.  No focal bony abnormality is identified.  IMPRESSION:  1.  Changes of colitis involving the descending colon through the rectosigmoid persist but appear mildly improved since the study 4 days ago.  There is no abscess or other new abnormality. 2.  Small retroperitoneal and mesenteric lymph nodes are stable in appearance and likely reactive. 3.  Large stool burden ascending and proximal transverse colon.   Original Report Authenticated By: Orlean Patten, M.D.   Ct Abdomen Pelvis W Contrast  04/23/2013   *RADIOLOGY REPORT*  Clinical Data: Abdominal pain, left side greater than right. Bloody diarrhea.  Nausea vomiting.  Colitis.  CT ABDOMEN AND PELVIS WITH CONTRAST  Technique:  Multidetector CT imaging of the abdomen and pelvis was performed following the standard protocol during bolus administration of intravenous contrast.  Contrast: 166m OMNIPAQUE IOHEXOL 300 MG/ML  SOLN  Comparison: 12/01/2011  Findings: Moderate wall thickening is seen involving the descending and sigmoid portions  of the colon which appears contiguous from the level of the splenic flexure to the rectum.  This is increased since previous study and is consistent with colitis.  There is no evidence of pneumatosis, abscess, or free fluid.  No evidence of bowel obstruction.  No evidence of bowel wall thickening involving the distal ileum or small bowel.  Normal appendix is visualized.  The liver, spleen, pancreas, gallbladder, adrenal glands, and kidneys are normal in appearance.  No evidence of hydronephrosis. Retroaortic left renal vein is incidentally noted. Mild lymphadenopathy is again seen in the in the left para-aortic region, with largest measuring 11 mm in Cherith Tewell axis. Mild lymphadenopathy is also seen in the sigmoid mesocolon.  These are stable since prior exam.  No other soft tissue masses are identified.  No suspicious bone lesions identified.  IMPRESSION:  1. Moderate left-sided colitis involving the descending and sigmoid colon, which is increased since previous study. Differential diagnosis includes ulcerative colitis and infectious colitis. 2.  Stable mild lymphadenopathy in the left para-aortic region and sigmoid mesocolon.  This is nonspecific, but may be reactive in etiology.   Original Report Authenticated By: JEarle Gell M.D.   Dg Abd 2 Views  04/20/2013   *RADIOLOGY REPORT*  Clinical Data: Abdominal pain  ABDOMEN - 2 VIEW  Comparison: 12/01/2011  Findings: The bowel gas pattern is nonobstructed.  Gas and stool noted throughout the colon up to the rectum.  No abnormal abdominal or pelvic calcifications.  IMPRESSION:  1.  Nonobstructive bowel gas pattern.   Original Report Authenticated By: TKerby Moors M.D.    Scheduled Meds: . Mesalamine  800 mg Oral TID  . mesalamine  1,000 mg Rectal BID  . metoCLOPramide  5 mg Oral TID AC  . [START ON 05/01/2013] polyethylene glycol  17 g Oral Daily  . predniSONE  20 mg Oral BID WC  . sodium chloride  3 mL Intravenous Q12H   Continuous Infusions: . sodium  chloride 75 mL/hr at 04/30/13 1355    Active Problems:   SUBSTANCE ABUSE, MULTIPLE   ULCERATIVE COLITIS   Ulcerative colitis   Schizophrenia    Time spent: 30 minutes   Aylee Littrell, MGunders[facility] Pager [phone] If 8PM-8AM, please contact night-coverage at [email or web address], password [email or web address]7/16/2014, 4:07 PM  LOS: 3 days

## 2013-04-30 NOTE — Progress Notes (Signed)
[facility] Progress Note   Subjective  Complains of abdominal pain   Objective   Vital signs in last 24 hours: Temp:  [98 F (36.7 C)-99.3 F (37.4 C)] 98.7 F (37.1 C) (07/16 0551) Pulse Rate:  [67-80] 67 (07/16 0551) Resp:  [18] 18 (07/16 0551) BP: (120-132)/(60-82) 120/60 mmHg (07/16 0551) SpO2:  [95 %-98 %] 95 % (07/16 0551) Weight:  [185 lb 1.6 oz (83.961 kg)] 185 lb 1.6 oz (83.961 kg) (07/15 2111) Last BM Date: 04/29/13 General:    Black male in NAD Heart:  Regular rate and rhythm; no murmurs Lungs: Respirations even and unlabored, lungs CTA bilaterally Abdomen:  Soft, nontender and nondistended. Normal bowel sounds. Extremities:  Without edema. Neurologic:  Alert and oriented,  grossly normal neurologically. Psych:  Cooperative. Normal mood and affect.  Lab Results:  Recent Labs  04/27/13 1551 04/28/13 0500  WBC 15.3* 12.2*  HGB 14.1 13.4  HCT 41.0 39.4  PLT 597* 556*   BMET  Recent Labs  04/27/13 1551 04/28/13 0500 04/30/13 0545  NA 137 136 135  K 3.9 4.5 4.0  CL 94* 96 95*  CO2 30 31 31   GLUCOSE 236* 150* 124*  BUN 23 18 17   CREATININE 0.95 0.77 0.78  CALCIUM 9.1 8.8 9.0   LFT  Recent Labs  04/28/13 0500  PROT 6.6  ALBUMIN 2.7*  AST 40*  ALT 109*  ALKPHOS 89  BILITOT 0.3      Assessment / Plan:   1. Ulcerative colitis. Tolerating clears yesterday, Will restart Asacol, continue BID mesalamine suppositories, change solumedrol to prednisone. Nausea / vomiting has resolved.  CTscan on admission showed improvement in colitis. Will try transitioning him to PO pain meds. Hopefully home soon   2. Constipation by CTscan. Large volume of stool in ascending and proximal transverse colon. Started MIralax yesterday and has had several loose stools since. Will decrease dose to once daily.   3. Schizophrenia   4. Hx of illicit drug use with positive marijuana and cocaine on recent admission   5. Elevated LFTs, improving  6.  Substance abuse   LOS: 3 days   Tye Savoy  04/30/2013, 9:47 AM  I have personally taken an interval history, reviewed the chart, and examined the patient.  I agree with the extender's note, impression and recommendations.  We need to STOP narcotics.  Can try tramadol.  Symptoms of pain are disproportionate.  May advance diet.  Sandy Salaam. Deatra Ina, MD, [facility] [phone]

## 2013-05-01 DIAGNOSIS — R197 Diarrhea, unspecified: Secondary | ICD-10-CM

## 2013-05-01 DIAGNOSIS — K5900 Constipation, unspecified: Secondary | ICD-10-CM

## 2013-05-01 DIAGNOSIS — F209 Schizophrenia, unspecified: Secondary | ICD-10-CM

## 2013-05-01 NOTE — Progress Notes (Signed)
[facility] Progress Note   Subjective  "I'm in pain"   Objective   Vital signs in last 24 hours: Temp:  [97.8 F (36.6 C)-99.4 F (37.4 C)] 98.4 F (36.9 C) (07/17 0520) Pulse Rate:  [73-82] 73 (07/17 0520) Resp:  [18] 18 (07/17 0520) BP: (118-132)/(47-72) 118/68 mmHg (07/17 0520) SpO2:  [98 %] 98 % (07/17 0520) Weight:  [173 lb 1 oz (78.5 kg)] 173 lb 1 oz (78.5 kg) (07/16 2038) Last BM Date: 04/30/13 General:    white male in NAD Heart:  Regular rate and rhythm; no murmurs Lungs: Respirations even and unlabored, lungs CTA bilaterally Abdomen:  Soft, nontender and nondistended. Normal bowel sounds. Extremities:  Without edema. Neurologic:  Alert and oriented,  grossly normal neurologically. Psych:  Cooperative. Normal mood and affect.  BMET  Recent Labs  04/30/13 0545  NA 135  K 4.0  CL 95*  CO2 31  GLUCOSE 124*  BUN 17  CREATININE 0.78  CALCIUM 9.0     Assessment / Plan:   1. Ulcerative colitis. CTscan on admission showed improvement in colitis. Diet advance from fulls to low fiber for lunch, will see how he does. Diarrhea resolving, no blood in stools. Continue Asacol,  BID mesalamine suppositories, BID prednisone.  Taking oxycodone about every 6 hours. He got oxycodone at 2:30am, ultram at 3am followed by Dilaudid at 3:40am. Complaining of pain upon my visit now, just got ultram but tells me the "pills don't help pain". We don't seem to be making much progress with his pain.   2. Constipation by CTscan. Large volume of stool in ascending and proximal transverse colon. Treated with Miralax with good results. Dose reduced to once daily yesterday.    3. Schizophrenia   4. Hx of illicit drug use with positive marijuana and cocaine on recent admission   5. Elevated LFTs, improving    LOS: 4 days   Tye Savoy  05/01/2013, 10:10 AM I have personally taken an interval history, reviewed the chart, and examined the patient. Colitis is improving.  WE  NEED TO D/C NARCOTICS.  Can lower prednisone to 17m qam , 120mpm.    RoSandy SalaamKaDeatra InaMD, [facility] [phone]

## 2013-05-01 NOTE — Progress Notes (Addendum)
[facility] PROGRESS NOTE  [name] FKC:[id number] DOB: Jun 07, 1981 DOA: 04/27/2013 PCP: Pcp Not In System  Assessment/Plan: Active Problems:   SUBSTANCE ABUSE, MULTIPLE   ULCERATIVE COLITIS   Ulcerative colitis   Schizophrenia   Ulcerative Colitis with flare. CT of the abdomen demonstrated moderate left-sided colitis and significant constipation. He was initially treated with PO prednisone, PO mesalamine, and mesalamine suppository and failed treatment so was transitioned to solumedrol and BID mesalamine suppositories on 7/14, now back on oral prednisone as of 7/16.  Patient initially stated that his pain is not better, however he has been using less pain medication over the course of the day, and objectively on exam he has less pain. - Appreciate gastroenterology assistance  - Continue mesalamine and prednisone - Advance diet to low fiber - Encourage oral pain medication - Change to:  mild pain, Tylenol; moderate pain, 1 percocet; severe pain, 2 Percocet; refractory pain, dilaudid IV -  This patient is at high risk of developing narcotic dependence and abuse given his history of illicit drug use and chronic illness which causes pain  Nausea/vomiting:  Improved. -  Continue Zofran and Reglan  Constipation: seen on CT and now resolved.  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  Schizophrenia, currently organized with appropriate affect.  Diet: Full liquid IVF:  KVO PROPH:  SCDs.  Code Status: full Family Communication: No family at bedside Disposition Plan:  As above    Brief narrative: [name] is a 32 y.o. male who was discharged several hours ago from the teaching service for symptomatic ulcerative colitis. See the discharge summary filed at 12:16pm today. Briefly, the patient was followed by GI, continued on steroids and mesalamine, ultimately  requiring higher doses of steroids. Hgb remained relatively stable, and the patient was recommended to continue on oral steroids and mesalamine and follow up with GI as an outpatient. Regarding the patient's schizophrenia, he patient was also instructed to continue on zyprexa for schizophrenia per Psych recs. The patient reports being unable to tolerate PO food and meds and feeling as if he were discharged "too soon." Pt subsequently presented back to the ED a few hours later. The teaching service was initially consulted, however, the patient refused to see the teaching service and instead requested hospitalist service.   Consultants:  GI  Procedures:   none  Antibiotics:   none  HPI/Subjective: Patient initially stated that he was no better, but then he admitted that his diarrhea has decreased in frequency and has become more formed, he has not vomited since yesterday and his nausea has improved. He states he was eating some food last night for dinner and again this morning. His abdominal pain he states is still severe, however after I examined him and he stated that he does think it is getting somewhat better.  Objective: Filed Vitals:   04/30/13 1744 04/30/13 2038 05/01/13 0520 05/01/13 1000  BP: 132/72 120/47 118/68 110/56  Pulse: 82 74 73 78  Temp: 98.6 F (37 C) 99.4 F (37.4 C) 98.4 F (36.9 C) 99.1 F (37.3 C)  TempSrc: Oral Oral Oral Oral  Resp: 18 18 18 18   Height:      Weight:  78.5 kg (173 lb 1 oz)    SpO2: 98% 98% 98% 96%    Intake/Output Summary (Last 24 hours) at 05/01/13 1451 Last data filed at 05/01/13 0800  Gross per 24  hour  Intake   2215 ml  Output      0 ml  Net   2215 ml    Exam:  General:  African American male, NAD HEENT: NCAT, MMM Cardiovascular: RRR, s1, s2; no murmurs, rubs or gallops Respiratory: normal resp effort, CTAB  Abdomen:  NABS, soft, nondistended, mildly tender to palpation on the left quadrants without rebound or guarding  (improved from yesterday). Skin: no abnormal skin lesions seen, normal skin turgor  Musculoskeletal: perfused, no clubbing or cyanosis  Psychiatric: normal  Neurologic: Grossly intact     Data Reviewed: Basic Metabolic Panel:  Recent Labs Lab 04/25/13 0555 04/26/13 0540 04/27/13 1551 04/28/13 0500 04/30/13 0545  NA 139 140 137 136 135  K 4.3 4.5 3.9 4.5 4.0  CL 102 101 94* 96 95*  CO2 27 33* 30 31 31   GLUCOSE 134* 159* 236* 150* 124*  BUN 9 14 23 18 17   CREATININE 0.66 0.66 0.95 0.77 0.78  CALCIUM 8.8 9.0 9.1 8.8 9.0    Liver Function Tests:  Recent Labs Lab 04/27/13 1551 04/28/13 0500  AST 97* 40*  ALT 157* 109*  ALKPHOS 126* 89  BILITOT 0.2* 0.3  PROT 7.4 6.6  ALBUMIN 3.1* 2.7*    Recent Labs Lab 04/27/13 1551  LIPASE 14   CBC:  Recent Labs Lab 04/25/13 0555 04/26/13 0540 04/27/13 1551 04/28/13 0500  WBC 14.5* 15.8* 15.3* 12.2*  NEUTROABS  --   --  10.7*  --   HGB 12.0* 12.6* 14.1 13.4  HCT 35.4* 37.7* 41.0 39.4  MCV 84.3 85.1 84.4 83.8  PLT 449* 497* 597* 556*    Recent Results (from the past 240 hour(s))  CLOSTRIDIUM DIFFICILE BY PCR     Status: None   Collection Time    04/21/13  4:09 PM      Result Value Range Status   C difficile by pcr NEGATIVE  NEGATIVE Final  STOOL CULTURE     Status: None   Collection Time    04/23/13  5:23 PM      Result Value Range Status   Specimen Description STOOL   Final   Special Requests Immunocompromised   Final   Culture     Final   Value: NO SALMONELLA, SHIGELLA, CAMPYLOBACTER, YERSINIA, OR E.COLI 0157:H7 ISOLATED   Report Status 04/27/2013 FINAL   Final  OVA AND PARASITE EXAMINATION     Status: None   Collection Time    04/23/13  5:23 PM      Result Value Range Status   Specimen Description STOOL   Final   Special Requests Immunocompromised   Final   Ova and parasites NO OVA OR PARASITES SEEN MODERATE YEAST   Final   Report Status 04/24/2013 FINAL   Final     Studies: Ct Abdomen Pelvis W  Contrast  04/27/2013   *RADIOLOGY REPORT*  Clinical Data: Nausea, vomiting and diarrhea.  History of ulcerative colitis.  CT ABDOMEN AND PELVIS WITH CONTRAST  Technique:  Multidetector CT imaging of the abdomen and pelvis was performed following the standard protocol during bolus administration of intravenous contrast.  Contrast: 123m OMNIPAQUE IOHEXOL 300 MG/ML  SOLN  Comparison: CT abdomen and pelvis 04/23/2013.  Findings: The lung bases are clear.  No pleural or pericardial effusion.  As on prior study, there is some thickening of the walls of the descending colon through the rectosigmoid consistent with colitis. Pericolonic inflammatory change about the descending colon appears slightly improved.  A large volume  of stool is present in the ascending and transverse colon. No focal fluid collection or free intraperitoneal air is present.  The appendix appears normal.  The stomach and small bowel are unremarkable.  The liver, gallbladder, adrenal glands, spleen, pancreas, kidneys and biliary tree all appear normal.  Scattered small lymph nodes in the root of the mesentery and retroperitoneum are again seen measuring up to 1.1 cm Dashawn Bartnick axis dimension in the left periaortic station on image 50.  No focal bony abnormality is identified.  IMPRESSION:  1.  Changes of colitis involving the descending colon through the rectosigmoid persist but appear mildly improved since the study 4 days ago.  There is no abscess or other new abnormality. 2.  Small retroperitoneal and mesenteric lymph nodes are stable in appearance and likely reactive. 3.  Large stool burden ascending and proximal transverse colon.   Original Report Authenticated By: Orlean Patten, M.D.   Ct Abdomen Pelvis W Contrast  04/23/2013   *RADIOLOGY REPORT*  Clinical Data: Abdominal pain, left side greater than right. Bloody diarrhea.  Nausea vomiting.  Colitis.  CT ABDOMEN AND PELVIS WITH CONTRAST  Technique:  Multidetector CT imaging of the abdomen and  pelvis was performed following the standard protocol during bolus administration of intravenous contrast.  Contrast: 125m OMNIPAQUE IOHEXOL 300 MG/ML  SOLN  Comparison: 12/01/2011  Findings: Moderate wall thickening is seen involving the descending and sigmoid portions of the colon which appears contiguous from the level of the splenic flexure to the rectum.  This is increased since previous study and is consistent with colitis.  There is no evidence of pneumatosis, abscess, or free fluid.  No evidence of bowel obstruction.  No evidence of bowel wall thickening involving the distal ileum or small bowel.  Normal appendix is visualized.  The liver, spleen, pancreas, gallbladder, adrenal glands, and kidneys are normal in appearance.  No evidence of hydronephrosis. Retroaortic left renal vein is incidentally noted. Mild lymphadenopathy is again seen in the in the left para-aortic region, with largest measuring 11 mm in Felicia Bloomquist axis. Mild lymphadenopathy is also seen in the sigmoid mesocolon.  These are stable since prior exam.  No other soft tissue masses are identified.  No suspicious bone lesions identified.  IMPRESSION:  1. Moderate left-sided colitis involving the descending and sigmoid colon, which is increased since previous study. Differential diagnosis includes ulcerative colitis and infectious colitis. 2.  Stable mild lymphadenopathy in the left para-aortic region and sigmoid mesocolon.  This is nonspecific, but may be reactive in etiology.   Original Report Authenticated By: JEarle Gell M.D.   Dg Abd 2 Views  04/20/2013   *RADIOLOGY REPORT*  Clinical Data: Abdominal pain  ABDOMEN - 2 VIEW  Comparison: 12/01/2011  Findings: The bowel gas pattern is nonobstructed.  Gas and stool noted throughout the colon up to the rectum.  No abnormal abdominal or pelvic calcifications.  IMPRESSION:  1.  Nonobstructive bowel gas pattern.   Original Report Authenticated By: TKerby Moors M.D.    Scheduled Meds: .  Mesalamine  800 mg Oral TID  . mesalamine  1,000 mg Rectal BID  . metoCLOPramide  5 mg Oral TID AC  . polyethylene glycol  17 g Oral Daily  . predniSONE  20 mg Oral BID WC  . sodium chloride  3 mL Intravenous Q12H   Continuous Infusions: . sodium chloride 1,000 mL (05/01/13 1028)    Active Problems:   SUBSTANCE ABUSE, MULTIPLE   ULCERATIVE COLITIS   Ulcerative colitis   Schizophrenia  Time spent: 30 minutes   Xitlali Kastens, [facility] Pager [phone]. If 8PM-8AM, please contact night-coverage at [email or web address], password [email or web address] 05/01/2013, 2:51 PM  LOS: 4 days

## 2013-05-02 DIAGNOSIS — K5190 Ulcerative colitis, unspecified, without complications: Secondary | ICD-10-CM

## 2013-05-02 MED ORDER — METOCLOPRAMIDE HCL 5 MG PO TABS: 5.0000 mg | ORAL_TABLET | Freq: Three times a day (TID) | ORAL | Status: DC

## 2013-05-02 MED ORDER — POLYETHYLENE GLYCOL 3350 17 G PO PACK: 17.0000 g | PACK | Freq: Every day | ORAL | Status: DC

## 2013-05-02 MED ORDER — MESALAMINE 1000 MG RE SUPP: 1000.0000 mg | Freq: Two times a day (BID) | RECTAL | Status: DC

## 2013-05-02 MED ORDER — PREDNISONE 10 MG PO TABS: ORAL_TABLET | ORAL | Status: DC

## 2013-05-02 MED ORDER — MESALAMINE 400 MG PO CPDR: 800.0000 mg | DELAYED_RELEASE_CAPSULE | Freq: Three times a day (TID) | ORAL | Status: DC

## 2013-05-02 MED ORDER — ONDANSETRON HCL 8 MG PO TABS: 8.0000 mg | ORAL_TABLET | Freq: Four times a day (QID) | ORAL | Status: DC | PRN

## 2013-05-02 MED ORDER — PREDNISONE 20 MG PO TABS
20.0000 mg | ORAL_TABLET | Freq: Every day | ORAL | Status: DC
  Filled 2013-05-02: qty 1

## 2013-05-02 MED ORDER — PREDNISONE 10 MG PO TABS
10.0000 mg | ORAL_TABLET | Freq: Every evening | ORAL | Status: DC
Start: 2013-05-02 — End: 2013-05-02
  Filled 2013-05-02: qty 1

## 2013-05-02 MED ORDER — TRAMADOL HCL 50 MG PO TABS: 100.0000 mg | ORAL_TABLET | Freq: Four times a day (QID) | ORAL | Status: DC | PRN

## 2013-05-02 MED ORDER — PREDNISONE 10 MG PO TABS: 10.0000 mg | ORAL_TABLET | Freq: Two times a day (BID) | ORAL | Status: DC

## 2013-05-02 MED ORDER — SIMETHICONE 80 MG PO CHEW: 160.0000 mg | CHEWABLE_TABLET | Freq: Four times a day (QID) | ORAL | Status: DC | PRN

## 2013-05-02 MED ORDER — TRAMADOL HCL 50 MG PO TABS
100.0000 mg | ORAL_TABLET | Freq: Four times a day (QID) | ORAL | Status: DC | PRN
  Filled 2013-05-02: qty 2

## 2013-05-02 NOTE — Progress Notes (Signed)
Pt received zofran 5m and1 oxy. At 0458. States neither did any good. Pt heard in the hall way loudly vomiting. Upon arrival to the room he was standing at the trash can by the door with amoderate amount of clear foamy liquid in trash. Requesting pain med. Iv. Instructed pt that the Dr's wanted him to be controlled on po meds. And not iv in order to go home as wellas the importance of staying away from recreational drug use, and following his diet. [place] with iv drugs.

## 2013-05-02 NOTE — Discharge Summary (Signed)
Physician Discharge Summary  Mell Guia PRX:[id number] DOB: 02-28-1981 DOA: 04/27/2013  PCP: Pcp Not In System  Admit date: 04/27/2013 Discharge date: 05/02/2013  Recommendations for Outpatient Follow-up:  1. Followup with primary care doctor within one week of discharge 2. Follow up with gastroenterology within one to 2 weeks  Discharge Diagnoses:  Active Problems:   SUBSTANCE ABUSE, MULTIPLE   ULCERATIVE COLITIS   Ulcerative colitis   Schizophrenia   Discharge Condition: Stable, improved  Diet recommendation: Regular  Wt Readings from Last 3 Encounters:  05/01/13 76.4 kg (168 lb 6.9 oz)  04/24/13 82.555 kg (182 lb)  05/12/12 78.2 kg (172 lb 6.4 oz)    History of present illness:   [name] is a 32 y.o. male who was discharged several hours ago from the teaching service for symptomatic ulcerative colitis. See the discharge summary filed at 12:16pm today. Briefly, the patient was followed by GI, continued on steroids and mesalamine, ultimately requiring higher doses of steroids. Hgb remained relatively stable, and the patient was recommended to continue on oral steroids and mesalamine and follow up with GI as an outpatient. Regarding the patient's schizophrenia, he patient was also instructed to continue on zyprexa for schizophrenia per Psych recs. The patient reports being unable to tolerate PO food and meds and feeling as if he were discharged "too soon." Pt subsequently presented back to the ED a few hours later. The teaching service was initially consulted, however, the patient refused to see the teaching service and instead requested hospitalist service.  Hospital Course:   Ulcerative colitis with flare. He presented with severe abdominal pain, nausea, vomiting. CT scan of the abdomen and pelvis demonstrated moderate left-sided colitis and significant constipation. He was given IV Solu-Medrol and twice-daily mesalamine suppositories starting on July 14th. He was also given  MiraLax. Over the next few days, his abdominal pain gradually improved, his nausea became less frequent, and he was able to tolerate a regular diet. After his first day of MiraLax, he had several bowel movements and his MiraLax was reduced to once daily dosing. He's been having several soft bowel movements daily on the current dose of MiraLax. He may adjust his dose as an outpatient so that he has approximately 2-3 soft bowel movements daily. He should continue a prednisone taper as an outpatient. For the next 5 days, he is instructed to take 20 mg of prednisone in the morning and 10 mg in the evening. After that, he can use 20 mg of prednisone once daily. He should follow up with Dr. Oretha Caprice next week. He was given a prescription for Zofran and Ultram. He was advised that he is at high risk of becoming dependent on narcotics or abusing them because of his history of illicit drug use. The patient voiced understanding.  He was told that he will not get any refills on medications from the hospital, however he can follow up with a primary care doctor in the next week who can talk to him about chronic pain management if he continues to have pain.  History of illicit drug use with positive marijuana, cocaine, and opiates screens on previous admissions. He is very frequently positive for cocaine. When asked, he denied drug use.  Counseled cessation.  Schizophrenia, currently organized thinking and appropriate affect.  Procedures:  CT scan abd and pelvis  Consultations:  GI, Dr. Deatra Ina  Discharge Exam: Filed Vitals:   05/02/13 0939  BP: 113/62  Pulse: 91  Temp: 98.9 F (37.2 C)  Resp:  Miller:   05/01/13 1746 05/01/13 2055 05/02/13 0524 05/02/13 0939  BP: 124/62 130/69 131/81 113/62  Pulse: 76 84 95 91  Temp: 98.8 F (37.1 C) 98.6 F (37 C) 99.5 F (37.5 C) 98.9 F (37.2 C)  TempSrc: Oral Oral Oral   Resp: 20 18 18 18   Height:      Weight:  76.4 kg (168 lb 6.9 oz)    SpO2:  98% 97% 100% 98%    General: African American male, NAD  HEENT: NCAT, MMM  Cardiovascular: RRR, s1, s2; no murmurs, rubs or gallops  Respiratory: CTAB  Abdomen: NABS, soft, nondistended, nontender.  Musculoskeletal: perfused, no clubbing or cyanosis  Neurologic: Grossly intact    Discharge Instructions   Future Appointments Provider Department Dept Phone   05/21/2013 10:00 AM Milus Banister, MD [facility] [phone]       Medication List    Notice   You have not been prescribed any medications.        The results of significant diagnostics from this hospitalization (including imaging, microbiology, ancillary and laboratory) are listed below for reference.    Significant Diagnostic Studies: Ct Abdomen Pelvis W Contrast  04/27/2013   *RADIOLOGY REPORT*  Clinical Data: Nausea, vomiting and diarrhea.  History of ulcerative colitis.  CT ABDOMEN AND PELVIS WITH CONTRAST  Technique:  Multidetector CT imaging of the abdomen and pelvis was performed following the standard protocol during bolus administration of intravenous contrast.  Contrast: 168m OMNIPAQUE IOHEXOL 300 MG/ML  SOLN  Comparison: CT abdomen and pelvis 04/23/2013.  Findings: The lung bases are clear.  No pleural or pericardial effusion.  As on prior study, there is some thickening of the walls of the descending colon through the rectosigmoid consistent with colitis. Pericolonic inflammatory change about the descending colon appears slightly improved.  A large volume of stool is present in the ascending and transverse colon. No focal fluid collection or free intraperitoneal air is present.  The appendix appears normal.  The stomach and small bowel are unremarkable.  The liver, gallbladder, adrenal glands, spleen, pancreas, kidneys and biliary tree all appear normal.  Scattered small lymph nodes in the root of the mesentery and retroperitoneum are again seen measuring up to 1.1 cm Ashelynn Marks axis dimension in  the left periaortic station on image 50.  No focal bony abnormality is identified.  IMPRESSION:  1.  Changes of colitis involving the descending colon through the rectosigmoid persist but appear mildly improved since the study 4 days ago.  There is no abscess or other new abnormality. 2.  Small retroperitoneal and mesenteric lymph nodes are stable in appearance and likely reactive. 3.  Large stool burden ascending and proximal transverse colon.   Original Report Authenticated By: TOrlean Patten M.D.   Ct Abdomen Pelvis W Contrast  04/23/2013   *RADIOLOGY REPORT*  Clinical Data: Abdominal pain, left side greater than right. Bloody diarrhea.  Nausea vomiting.  Colitis.  CT ABDOMEN AND PELVIS WITH CONTRAST  Technique:  Multidetector CT imaging of the abdomen and pelvis was performed following the standard protocol during bolus administration of intravenous contrast.  Contrast: 1027mOMNIPAQUE IOHEXOL 300 MG/ML  SOLN  Comparison: 12/01/2011  Findings: Moderate wall thickening is seen involving the descending and sigmoid portions of the colon which appears contiguous from the level of the splenic flexure to the rectum.  This is increased since previous study and is consistent with colitis.  There is no evidence of pneumatosis, abscess, or free  fluid.  No evidence of bowel obstruction.  No evidence of bowel wall thickening involving the distal ileum or small bowel.  Normal appendix is visualized.  The liver, spleen, pancreas, gallbladder, adrenal glands, and kidneys are normal in appearance.  No evidence of hydronephrosis. Retroaortic left renal vein is incidentally noted. Mild lymphadenopathy is again seen in the in the left para-aortic region, with largest measuring 11 mm in Tysha Grismore axis. Mild lymphadenopathy is also seen in the sigmoid mesocolon.  These are stable since prior exam.  No other soft tissue masses are identified.  No suspicious bone lesions identified.  IMPRESSION:  1. Moderate left-sided colitis  involving the descending and sigmoid colon, which is increased since previous study. Differential diagnosis includes ulcerative colitis and infectious colitis. 2.  Stable mild lymphadenopathy in the left para-aortic region and sigmoid mesocolon.  This is nonspecific, but may be reactive in etiology.   Original Report Authenticated By: Earle Gell, M.D.   Dg Abd 2 Views  04/20/2013   *RADIOLOGY REPORT*  Clinical Data: Abdominal pain  ABDOMEN - 2 VIEW  Comparison: 12/01/2011  Findings: The bowel gas pattern is nonobstructed.  Gas and stool noted throughout the colon up to the rectum.  No abnormal abdominal or pelvic calcifications.  IMPRESSION:  1.  Nonobstructive bowel gas pattern.   Original Report Authenticated By: Kerby Moors, M.D.    Microbiology: Recent Results (from the past 240 hour(s))  STOOL CULTURE     Status: None   Collection Time    04/23/13  5:23 PM      Result Value Range Status   Specimen Description STOOL   Final   Special Requests Immunocompromised   Final   Culture     Final   Value: NO SALMONELLA, SHIGELLA, CAMPYLOBACTER, YERSINIA, OR E.COLI 0157:H7 ISOLATED   Report Status 04/27/2013 FINAL   Final  OVA AND PARASITE EXAMINATION     Status: None   Collection Time    04/23/13  5:23 PM      Result Value Range Status   Specimen Description STOOL   Final   Special Requests Immunocompromised   Final   Ova and parasites NO OVA OR PARASITES SEEN MODERATE YEAST   Final   Report Status 04/24/2013 FINAL   Final     Labs: Basic Metabolic Panel:  Recent Labs Lab 04/26/13 0540 04/27/13 1551 04/28/13 0500 04/30/13 0545  NA 140 137 136 135  K 4.5 3.9 4.5 4.0  CL 101 94* 96 95*  CO2 33* 30 31 31   GLUCOSE 159* 236* 150* 124*  BUN 14 23 18 17   CREATININE 0.66 0.95 0.77 0.78  CALCIUM 9.0 9.1 8.8 9.0   Liver Function Tests:  Recent Labs Lab 04/27/13 1551 04/28/13 0500  AST 97* 40*  ALT 157* 109*  ALKPHOS 126* 89  BILITOT 0.2* 0.3  PROT 7.4 6.6  ALBUMIN 3.1* 2.7*     Recent Labs Lab 04/27/13 1551  LIPASE 14   No results found for this basename: AMMONIA,  in the last 168 hours CBC:  Recent Labs Lab 04/26/13 0540 04/27/13 1551 04/28/13 0500  WBC 15.8* 15.3* 12.2*  NEUTROABS  --  10.7*  --   HGB 12.6* 14.1 13.4  HCT 37.7* 41.0 39.4  MCV 85.1 84.4 83.8  PLT 497* 597* 556*   Cardiac Enzymes: No results found for this basename: CKTOTAL, CKMB, CKMBINDEX, TROPONINI,  in the last 168 hours BNP: BNP (last 3 results) No results found for this basename: PROBNP,  in the  last 8760 hours CBG: No results found for this basename: GLUCAP,  in the last 168 hours  Time coordinating discharge: 45 minutes  Signed:  Ani Deoliveira  [facility] 05/02/2013, 10:36 AM

## 2013-05-02 NOTE — Discharge Summary (Signed)
Pt d/c'd to home per wheelchair and family member. IV d/c'd, tele d/c'd, belongings with pt. D.c summary to pt and pt understands all instructions given, Education material for substance abuse and colitis given. Pt. States he has no questions. Prescriptions called into pharmacy, f/u appt. madee with Dr. Ardis Hughs.

## 2013-05-06 ENCOUNTER — Inpatient Hospital Stay (HOSPITAL_COMMUNITY): Payer: Medicare Other

## 2013-05-06 ENCOUNTER — Inpatient Hospital Stay (HOSPITAL_COMMUNITY)
Admission: EM | Admit: 2013-05-06 | Discharge: 2013-05-08 | DRG: 387 | Disposition: A | Discharge status: Home / Self Care | Payer: Medicare Other | Attending: Internal Medicine | Admitting: Internal Medicine

## 2013-05-06 ENCOUNTER — Encounter (HOSPITAL_COMMUNITY): Payer: Self-pay | Admitting: Emergency Medicine

## 2013-05-06 DIAGNOSIS — F3289 Other specified depressive episodes: Secondary | ICD-10-CM | POA: Diagnosis present

## 2013-05-06 DIAGNOSIS — K51918 Ulcerative colitis, unspecified with other complication: Secondary | ICD-10-CM

## 2013-05-06 DIAGNOSIS — F329 Major depressive disorder, single episode, unspecified: Secondary | ICD-10-CM | POA: Diagnosis present

## 2013-05-06 DIAGNOSIS — K5180 Other ulcerative colitis without complications: Secondary | ICD-10-CM

## 2013-05-06 DIAGNOSIS — D72829 Elevated white blood cell count, unspecified: Secondary | ICD-10-CM

## 2013-05-06 DIAGNOSIS — K529 Noninfective gastroenteritis and colitis, unspecified: Secondary | ICD-10-CM

## 2013-05-06 DIAGNOSIS — K5289 Other specified noninfective gastroenteritis and colitis: Secondary | ICD-10-CM

## 2013-05-06 DIAGNOSIS — F209 Schizophrenia, unspecified: Secondary | ICD-10-CM

## 2013-05-06 DIAGNOSIS — R197 Diarrhea, unspecified: Secondary | ICD-10-CM

## 2013-05-06 DIAGNOSIS — F1910 Other psychoactive substance abuse, uncomplicated: Secondary | ICD-10-CM

## 2013-05-06 DIAGNOSIS — E876 Hypokalemia: Secondary | ICD-10-CM

## 2013-05-06 DIAGNOSIS — F200 Paranoid schizophrenia: Secondary | ICD-10-CM | POA: Diagnosis present

## 2013-05-06 DIAGNOSIS — R1084 Generalized abdominal pain: Secondary | ICD-10-CM

## 2013-05-06 DIAGNOSIS — K5190 Ulcerative colitis, unspecified, without complications: Principal | ICD-10-CM

## 2013-05-06 DIAGNOSIS — Z79899 Other long term (current) drug therapy: Secondary | ICD-10-CM

## 2013-05-06 DIAGNOSIS — F17200 Nicotine dependence, unspecified, uncomplicated: Secondary | ICD-10-CM | POA: Diagnosis present

## 2013-05-06 HISTORY — DX: Gastrointestinal hemorrhage, unspecified: K92.2

## 2013-05-06 LAB — RAPID URINE DRUG SCREEN, HOSP PERFORMED
Benzodiazepines: NOT DETECTED
Cocaine: NOT DETECTED

## 2013-05-06 LAB — CBC WITH DIFFERENTIAL/PLATELET
Basophils Absolute: 0 10*3/uL (ref 0.0–0.1)
Basophils Relative: 0 % (ref 0–1)
Eosinophils Absolute: 0.1 10*3/uL (ref 0.0–0.7)
HCT: 37.6 % — ABNORMAL LOW (ref 39.0–52.0)
Hemoglobin: 12.9 g/dL — ABNORMAL LOW (ref 13.0–17.0)
Lymphocytes Relative: 10 % — ABNORMAL LOW (ref 12–46)
Monocytes Relative: 17 % — ABNORMAL HIGH (ref 3–12)
Neutro Abs: 9.9 10*3/uL — ABNORMAL HIGH (ref 1.7–7.7)
Neutrophils Relative %: 72 % (ref 43–77)
RDW: 15.2 % (ref 11.5–15.5)
WBC: 13.7 10*3/uL — ABNORMAL HIGH (ref 4.0–10.5)

## 2013-05-06 LAB — URINE MICROSCOPIC-ADD ON

## 2013-05-06 LAB — URINALYSIS, ROUTINE W REFLEX MICROSCOPIC
Ketones, ur: NEGATIVE mg/dL
Leukocytes, UA: NEGATIVE
Nitrite: NEGATIVE
Specific Gravity, Urine: 1.039 — ABNORMAL HIGH (ref 1.005–1.030)
pH: 6 (ref 5.0–8.0)

## 2013-05-06 LAB — COMPREHENSIVE METABOLIC PANEL
ALT: 10 U/L (ref 0–53)
Albumin: 2.5 g/dL — ABNORMAL LOW (ref 3.5–5.2)
Alkaline Phosphatase: 58 U/L (ref 39–117)
BUN: 18 mg/dL (ref 6–23)
Chloride: 87 mEq/L — ABNORMAL LOW (ref 96–112)
Potassium: 2.7 mEq/L — CL (ref 3.5–5.1)
Sodium: 130 mEq/L — ABNORMAL LOW (ref 135–145)
Total Bilirubin: 0.3 mg/dL (ref 0.3–1.2)
Total Protein: 5.5 g/dL — ABNORMAL LOW (ref 6.0–8.3)

## 2013-05-06 LAB — LIPASE, BLOOD: Lipase: 10 U/L — ABNORMAL LOW (ref 11–59)

## 2013-05-06 LAB — HEMOGLOBIN AND HEMATOCRIT, BLOOD: Hemoglobin: 11.5 g/dL — ABNORMAL LOW (ref 13.0–17.0)

## 2013-05-06 MED ORDER — SODIUM CHLORIDE 0.9 % IV SOLN
INTRAVENOUS | Status: AC
  Administered 2013-05-06 – 2013-05-07 (×2): via INTRAVENOUS

## 2013-05-06 MED ORDER — POLYETHYLENE GLYCOL 3350 17 G PO PACK
17.0000 g | PACK | Freq: Every day | ORAL | Status: DC
  Filled 2013-05-06 (×2): qty 1

## 2013-05-06 MED ORDER — MESALAMINE 400 MG PO CPDR
800.0000 mg | DELAYED_RELEASE_CAPSULE | Freq: Three times a day (TID) | ORAL | Status: DC
  Administered 2013-05-06 – 2013-05-08 (×6): 800 mg via ORAL
  Filled 2013-05-06 (×8): qty 2

## 2013-05-06 MED ORDER — TRAMADOL HCL 50 MG PO TABS: 100.0000 mg | ORAL_TABLET | Freq: Four times a day (QID) | ORAL | Status: DC | PRN

## 2013-05-06 MED ORDER — ALBUTEROL SULFATE (5 MG/ML) 0.5% IN NEBU: 2.5000 mg | INHALATION_SOLUTION | RESPIRATORY_TRACT | Status: DC | PRN

## 2013-05-06 MED ORDER — POTASSIUM CHLORIDE 10 MEQ/100ML IV SOLN
10.0000 meq | INTRAVENOUS | Status: AC
  Administered 2013-05-06 (×4): 10 meq via INTRAVENOUS
  Filled 2013-05-06: qty 300
  Filled 2013-05-06: qty 100

## 2013-05-06 MED ORDER — MORPHINE SULFATE 4 MG/ML IJ SOLN
4.0000 mg | Freq: Once | INTRAMUSCULAR | Status: AC
  Administered 2013-05-06: 4 mg via INTRAVENOUS
  Filled 2013-05-06: qty 1

## 2013-05-06 MED ORDER — CIPROFLOXACIN IN D5W 400 MG/200ML IV SOLN
400.0000 mg | Freq: Two times a day (BID) | INTRAVENOUS | Status: DC
  Administered 2013-05-06: 400 mg via INTRAVENOUS
  Filled 2013-05-06 (×2): qty 200

## 2013-05-06 MED ORDER — METRONIDAZOLE IN NACL 5-0.79 MG/ML-% IV SOLN
500.0000 mg | Freq: Three times a day (TID) | INTRAVENOUS | Status: DC
  Administered 2013-05-06: 500 mg via INTRAVENOUS
  Filled 2013-05-06 (×3): qty 100

## 2013-05-06 MED ORDER — SODIUM CHLORIDE 0.9 % IV SOLN
Freq: Once | INTRAVENOUS | Status: AC
  Administered 2013-05-06: 09:00:00 via INTRAVENOUS

## 2013-05-06 MED ORDER — SIMETHICONE 80 MG PO CHEW
160.0000 mg | CHEWABLE_TABLET | Freq: Four times a day (QID) | ORAL | Status: DC | PRN
  Filled 2013-05-06: qty 2

## 2013-05-06 MED ORDER — POTASSIUM CHLORIDE CRYS ER 20 MEQ PO TBCR
40.0000 meq | EXTENDED_RELEASE_TABLET | Freq: Once | ORAL | Status: AC
  Administered 2013-05-06: 40 meq via ORAL
  Filled 2013-05-06: qty 2

## 2013-05-06 MED ORDER — ONDANSETRON HCL 4 MG/2ML IJ SOLN
4.0000 mg | Freq: Once | INTRAMUSCULAR | Status: AC
  Administered 2013-05-06: 4 mg via INTRAVENOUS
  Filled 2013-05-06: qty 2

## 2013-05-06 MED ORDER — ONDANSETRON HCL 4 MG/2ML IJ SOLN
4.0000 mg | Freq: Four times a day (QID) | INTRAMUSCULAR | Status: DC | PRN
  Administered 2013-05-06 – 2013-05-07 (×2): 4 mg via INTRAVENOUS
  Filled 2013-05-06 (×2): qty 2

## 2013-05-06 MED ORDER — METHYLPREDNISOLONE SODIUM SUCC 125 MG IJ SOLR
60.0000 mg | Freq: Every day | INTRAMUSCULAR | Status: DC
  Administered 2013-05-06 – 2013-05-07 (×2): 60 mg via INTRAVENOUS
  Filled 2013-05-06 (×2): qty 0.96

## 2013-05-06 MED ORDER — HYDROCODONE-ACETAMINOPHEN 5-325 MG PO TABS
1.0000 | ORAL_TABLET | ORAL | Status: DC | PRN
  Filled 2013-05-06: qty 2

## 2013-05-06 MED ORDER — METOCLOPRAMIDE HCL 5 MG PO TABS
5.0000 mg | ORAL_TABLET | Freq: Three times a day (TID) | ORAL | Status: DC
  Administered 2013-05-06 – 2013-05-08 (×5): 5 mg via ORAL
  Filled 2013-05-06 (×8): qty 1

## 2013-05-06 MED ORDER — MESALAMINE 1000 MG RE SUPP
1000.0000 mg | Freq: Two times a day (BID) | RECTAL | Status: DC
  Filled 2013-05-06 (×6): qty 1

## 2013-05-06 MED ORDER — ONDANSETRON HCL 4 MG PO TABS: 4.0000 mg | ORAL_TABLET | Freq: Four times a day (QID) | ORAL | Status: DC | PRN

## 2013-05-06 MED ORDER — MORPHINE SULFATE 2 MG/ML IJ SOLN
2.0000 mg | Freq: Four times a day (QID) | INTRAMUSCULAR | Status: DC | PRN
  Administered 2013-05-06 – 2013-05-08 (×5): 2 mg via INTRAVENOUS
  Filled 2013-05-06 (×6): qty 1

## 2013-05-06 MED ORDER — OLANZAPINE 5 MG PO TABS
5.0000 mg | ORAL_TABLET | Freq: Every day | ORAL | Status: DC
  Administered 2013-05-06 – 2013-05-07 (×2): 5 mg via ORAL
  Filled 2013-05-06 (×3): qty 1

## 2013-05-06 MED ORDER — GUAIFENESIN-DM 100-10 MG/5ML PO SYRP: 5.0000 mL | ORAL_SOLUTION | ORAL | Status: DC | PRN

## 2013-05-06 NOTE — ED Provider Notes (Signed)
History    CSN: [id number] Arrival date & time 05/06/13  0827  First MD Initiated Contact with Patient 05/06/13 [phone]     Chief Complaint  Patient presents with  . Emesis   (Consider location/radiation/quality/duration/timing/severity/associated sxs/prior Treatment) HPI Comments: Patient is a 32 year old male with history of recent discharge from the hospital who presents today with ongoing vomiting. He had the symptoms for the past 2 weeks. He has history of ulcerative colitis and was admitted for a flare on July 6 with discharge July 13. He was then admitted again later in the day on July 13 with discharge on July 18. He states his symptoms worsened on July 18 after he was discharged from the hospital. He reports he has nonbloody emesis approximately 6 times daily. He has 3 episodes of bloody diarrhea daily. His mother has been giving him his medications as prescribed since his discharge. She states she's been cooking him light meals including mashed potatoes and ginger ale. History of polysubstance abuse. Denies any drug use. He has been taking zyprexa for his schizophrenia. No hallucinations.   Patient is a 32 y.o. male presenting with vomiting. The history is provided by the patient and a parent. No language interpreter was used.  Emesis Associated symptoms: diarrhea   Associated symptoms: no chills    Past Medical History  Diagnosis Date  . Ulcerative colitis   . Schizophrenia    Past Surgical History  Procedure Laterality Date  . Mandible fracture surgery    . Flexible sigmoidoscopy  12/05/2011    Procedure: FLEXIBLE SIGMOIDOSCOPY;  Surgeon: Beryle Beams, MD;  Location: [facility] ENDOSCOPY;  Service: Endoscopy;  Laterality: N/A;   No family history on file. History  Substance Use Topics  . Smoking status: Current Every Day Smoker -- 0.25 packs/day for 20 years    Types: Cigarettes  . Smokeless tobacco: Not on file  . Alcohol Use: Yes     Comment: occasional    Review of  Systems  Constitutional: Positive for appetite change. Negative for fever and chills.  Respiratory: Negative for shortness of breath.   Gastrointestinal: Positive for nausea, vomiting, diarrhea and blood in stool.  All other systems reviewed and are negative.    Allergies  Depakote; Lithium; and Nsaids  Home Medications   Current Outpatient Rx  Name  Route  Sig  Dispense  Refill  . Mesalamine (ASACOL) 400 MG CPDR   Oral   Take 2 capsules (800 mg total) by mouth 3 (three) times daily.   180 capsule   0   . mesalamine (CANASA) 1000 MG suppository   Rectal   Place 1 suppository (1,000 mg total) rectally 2 (two) times daily.   60 suppository   0   . metoCLOPramide (REGLAN) 5 MG tablet   Oral   Take 1 tablet (5 mg total) by mouth 3 (three) times daily before meals.   90 tablet   0   . ondansetron (ZOFRAN) 8 MG tablet   Oral   Take 1 tablet (8 mg total) by mouth every 6 (six) hours as needed.   30 tablet   0   . polyethylene glycol (MIRALAX / GLYCOLAX) packet   Oral   Take 17 g by mouth daily.   100 each   0   . predniSONE (DELTASONE) 10 MG tablet      Take 2 tabs by mouth in the morning and 1 tab in the evening for 5 days, then take 2 tabs in the morning  thereafter.   65 tablet   0   . simethicone (MYLICON) 80 MG chewable tablet   Oral   Chew 2 tablets (160 mg total) by mouth 4 (four) times daily as needed for flatulence (gas).   30 tablet   0   . traMADol (ULTRAM) 50 MG tablet   Oral   Take 2 tablets (100 mg total) by mouth every 6 (six) hours as needed.   60 tablet   0    BP 138/93  Pulse 111  Temp(Src) 98.2 F (36.8 C) (Oral)  Resp 18  SpO2 99% Physical Exam  Nursing note and vitals reviewed. Constitutional: He is oriented to person, place, and time. He appears well-developed and well-nourished. No distress.  HENT:  Head: Normocephalic and atraumatic.  Right Ear: External ear normal.  Left Ear: External ear normal.  Nose: Nose normal.  Eyes:  Conjunctivae are normal.  Neck: Normal range of motion. No tracheal deviation present.  Cardiovascular: Normal rate, regular rhythm and normal heart sounds.   Pulmonary/Chest: Effort normal and breath sounds normal. No stridor.  Abdominal: Soft. He exhibits no distension and no mass. There is generalized tenderness. There is no rigidity, no rebound and no guarding.  Musculoskeletal: Normal range of motion.  Neurological: He is alert and oriented to person, place, and time.  Skin: Skin is warm and dry. He is not diaphoretic.  Psychiatric: He has a normal mood and affect. His behavior is normal.    ED Course  Procedures (including critical care time) Labs Reviewed  CBC WITH DIFFERENTIAL - Abnormal; Notable for the following:    WBC 13.7 (*)    Hemoglobin 12.9 (*)    HCT 37.6 (*)    Platelets 504 (*)    Lymphocytes Relative 10 (*)    Monocytes Relative 17 (*)    Neutro Abs 9.9 (*)    Monocytes Absolute 2.3 (*)    All other components within normal limits  COMPREHENSIVE METABOLIC PANEL - Abnormal; Notable for the following:    Sodium 130 (*)    Potassium 2.7 (*)    Chloride 87 (*)    Glucose, Bld 195 (*)    Total Protein 5.5 (*)    Albumin 2.5 (*)    All other components within normal limits  LIPASE, BLOOD - Abnormal; Notable for the following:    Lipase 10 (*)    All other components within normal limits  URINALYSIS, ROUTINE W REFLEX MICROSCOPIC - Abnormal; Notable for the following:    Color, Urine AMBER (*)    Specific Gravity, Urine 1.039 (*)    Bilirubin Urine MODERATE (*)    Protein, ur 30 (*)    All other components within normal limits  URINE RAPID DRUG SCREEN (HOSP PERFORMED) - Abnormal; Notable for the following:    Opiates POSITIVE (*)    All other components within normal limits  POTASSIUM - Abnormal; Notable for the following:    Potassium 3.4 (*)    All other components within normal limits  HEMOGLOBIN AND HEMATOCRIT, BLOOD - Abnormal; Notable for the  following:    Hemoglobin 11.5 (*)    HCT 33.7 (*)    All other components within normal limits  CBC - Abnormal; Notable for the following:    WBC 12.1 (*)    RBC 3.64 (*)    Hemoglobin 10.2 (*)    HCT 30.2 (*)    Platelets 436 (*)    All other components within normal limits  URINE MICROSCOPIC-ADD ON  MAGNESIUM  BASIC METABOLIC PANEL  TYPE AND SCREEN   Dg Chest Port 1 View  05/06/2013   *RADIOLOGY REPORT*  Clinical Data: Leukocytosis  PORTABLE CHEST - 1 VIEW  Comparison: None.  Findings: Lungs clear.  Heart size and pulmonary vascularity are normal.  No adenopathy.  No bone lesions.  IMPRESSION: No abnormality noted.   Original Report Authenticated By: Lowella Grip, M.D.   Dg Abd 2 Views  05/06/2013   *RADIOLOGY REPORT*  Clinical Data: Nausea and vomiting.  Ulcerative colitis.  ABDOMEN - 2 VIEW  Comparison: 04/20/2013  Findings: Moderate stool burden in the ascending colon.  No free intraperitoneal gas.  Diffuse gaseous distention of both small and large bowel in an ileus pattern.  Sigmoid colon gas is present. The sigmoid colon wall is irregular and thickened consistent with the findings noted on the recent CT abdomen.  There is no obvious pneumatosis.  Phleboliths project over the pelvis.  IMPRESSION: Diffuse gaseous distention as described.  Colonic wall irregularity and thickening in the sigmoid colon is noted as previously described on the CT.  No free intraperitoneal gas or pneumatosis.   Original Report Authenticated By: Marybelle Killings, M.D.   1. Abdominal pain, generalized   2. Colitis   3. Diarrhea   4. Hypopotassemia   5. Leukocytosis   6. Schizophrenia   7. Ulcerative colitis, other complication   8. Nausea and vomiting   9. Ulcerative colitis, unspecified     MDM  Patient presents after d/c from hospital with UC flare on 7/18. Continued n/v and bloody diarrhea despite compliance with medications. Found to be hypokalemic and potassium given. Patient's pain responded to  morphine and zofran. Discussed case with hospitalist who agrees to admit. Vital signs stable.   Elwyn Lade, PA-C 05/07/13 [phone]

## 2013-05-06 NOTE — ED Notes (Signed)
Lab called with potassium of 2.7, EDP notified.

## 2013-05-06 NOTE — ED Notes (Signed)
MD at bedside. 

## 2013-05-06 NOTE — H&P (Signed)
[facility]                                                                                    Patient Demographics  [name], is a 32 y.o. male  CSN: [id number]  MRN: [id number]  DOB - 1981-05-01  Admit Date - 05/06/2013  Outpatient Primary MD for the patient is No PCP Per Patient   With History of -  Past Medical History  Diagnosis Date  . Ulcerative colitis   . Schizophrenia       Past Surgical History  Procedure Laterality Date  . Mandible fracture surgery    . Flexible sigmoidoscopy  12/05/2011    Procedure: FLEXIBLE SIGMOIDOSCOPY;  Surgeon: Beryle Beams, MD;  Location: [facility] ENDOSCOPY;  Service: Endoscopy;  Laterality: N/A;    in for   Chief Complaint  Patient presents with  . Emesis     HPI  [name]  is a 32 y.o. male, with H/O UC x 13 yrs, polysubstance abuse, smoking, and Schizophrenia 2 recent admits for UC flare, was DC'd few days ago on Asacol and Steroids comes back with some N&V but > 20 blood BMS, stool is loose with mucous and some red blood, no pain as such, in the ER has Leukocytosis, Low K, he is afebrile, he was diagnosed with recurrent UC flare and I was called to admit him, his schizophrenia is stable, no hallucinations, no cocaine in the last month.    Review of Systems    In addition to the HPI above,   No Fever-chills, No Headache, No changes with Vision or hearing, No problems swallowing food or Liquids, No Chest pain, Cough or Shortness of Breath, No Abdominal pain, No Nausea or Vommitting, ++ diarrhea-mucous with some blood No Blood in Urine, No dysuria, No new skin rashes or bruises, No new joints pains-aches,  No new weakness, tingling, numbness in any extremity, No recent weight gain or loss, No polyuria, polydypsia or polyphagia, No significant Mental Stressors.  A full 10 point Review of Systems was done, except as stated above, all other Review of Systems were negative.   Social History History   Substance Use Topics  . Smoking status: Current Every Day Smoker -- 0.25 packs/day for 20 years    Types: Cigarettes  . Smokeless tobacco: Not on file  . Alcohol Use: Yes     Comment: occasional      Family History No IBD or Colon CA  Prior to Admission medications   Medication Sig Start Date End Date Taking? Authorizing Provider  Mesalamine (ASACOL) 400 MG CPDR Take 2 capsules (800 mg total) by mouth 3 (three) times daily. 05/02/13  Yes Janece Canterbury, MD  mesalamine (CANASA) 1000 MG suppository Place 1 suppository (1,000 mg total) rectally 2 (two) times daily. 05/02/13  Yes Janece Canterbury, MD  metoCLOPramide (REGLAN) 5 MG tablet Take 1 tablet (5 mg total) by mouth 3 (three) times daily before meals. 05/02/13  Yes Janece Canterbury, MD  ondansetron (ZOFRAN) 8 MG tablet Take 1 tablet (8 mg total) by mouth every 6 (six) hours as needed. 05/02/13  Yes Janece Canterbury, MD  polyethylene glycol (MIRALAX / Floria Raveling)  packet Take 17 g by mouth daily. 05/02/13  Yes Janece Canterbury, MD  predniSONE (DELTASONE) 10 MG tablet Take 2 tabs by mouth in the morning and 1 tab in the evening for 5 days, then take 2 tabs in the morning thereafter. 05/02/13  Yes Janece Canterbury, MD  simethicone (MYLICON) 80 MG chewable tablet Chew 2 tablets (160 mg total) by mouth 4 (four) times daily as needed for flatulence (gas). 05/02/13  Yes Janece Canterbury, MD  traMADol (ULTRAM) 50 MG tablet Take 2 tablets (100 mg total) by mouth every 6 (six) hours as needed. 05/02/13  Yes Janece Canterbury, MD    Allergies  Allergen Reactions  . Depakote (Divalproex Sodium) Other (See Comments)    Reaction: seizures   . Lithium Other (See Comments)    Reaction: rectal bleeding  . Nsaids Other (See Comments)    Reaction: rectal bleeding    Physical Exam  Vitals  Blood pressure 113/71, pulse 94, temperature 98.2 F (36.8 C), temperature source Oral, resp. rate 20, SpO2 97.00%.   1. General young AA male lying in bed in NAD,      2. Normal affect and insight, Not Suicidal or Homicidal, Awake Alert, Oriented X 3.  3. No F.N deficits, ALL C.Nerves Intact, Strength 5/5 all 4 extremities, Sensation intact all 4 extremities, Plantars down going.  4. Ears and Eyes appear Normal, Conjunctivae clear, PERRLA. Moist Oral Mucosa.  5. Supple Neck, No JVD, No cervical lymphadenopathy appriciated, No Carotid Bruits.  6. Symmetrical Chest wall movement, Good air movement bilaterally, CTAB.  7. RRR, No Gallops, Rubs or Murmurs, No Parasternal Heave.  8. Positive Bowel Sounds, Abdomen Soft, Non tender, No organomegaly appriciated,No rebound -guarding or rigidity.  9.  No Cyanosis, Normal Skin Turgor, No Skin Rash or Bruise.  10. Good muscle tone,  joints appear normal , no effusions, Normal ROM.  11. No Palpable Lymph Nodes in Neck or Axillae     Data Review  CBC  Recent Labs Lab 05/06/13 0812  WBC 13.7*  HGB 12.9*  HCT 37.6*  PLT 504*  MCV 82.6  MCH 28.4  MCHC 34.3  RDW 15.2  LYMPHSABS 1.4  MONOABS 2.3*  EOSABS 0.1  BASOSABS 0.0   ------------------------------------------------------------------------------------------------------------------  Chemistries   Recent Labs Lab 04/30/13 0545 05/06/13 0812  NA 135 130*  K 4.0 2.7*  CL 95* 87*  CO2 31 30  GLUCOSE 124* 195*  BUN 17 18  CREATININE 0.78 1.03  CALCIUM 9.0 8.5  AST  --  10  ALT  --  10  ALKPHOS  --  48  BILITOT  --  0.3   ------------------------------------------------------------------------------------------------------------------ CrCl is unknown because both a height and weight (above a minimum accepted value) are required for this calculation. ------------------------------------------------------------------------------------------------------------------ No results found for this basename: TSH, T4TOTAL, FREET3, T3FREE, THYROIDAB,  in the last 72 hours   Coagulation profile No results found for this basename: INR,  PROTIME,  in the last 168 hours ------------------------------------------------------------------------------------------------------------------- No results found for this basename: DDIMER,  in the last 72 hours -------------------------------------------------------------------------------------------------------------------  Cardiac Enzymes No results found for this basename: CK, CKMB, TROPONINI, MYOGLOBIN,  in the last 168 hours ------------------------------------------------------------------------------------------------------------------ No components found with this basename: POCBNP,    ---------------------------------------------------------------------------------------------------------------  Urinalysis    Component Value Date/Time   COLORURINE AMBER* 05/06/2013 [place] 05/06/2013 1131   LABSPEC 1.039* 05/06/2013 1131   PHURINE 6.0 05/06/2013 [place] 05/06/2013 [place] 05/06/2013 1131  BILIRUBINUR MODERATE* 05/06/2013 [place] 05/06/2013 1131   PROTEINUR 30* 05/06/2013 1131   UROBILINOGEN 1.0 05/06/2013 1131   NITRITE NEGATIVE 05/06/2013 1131   LEUKOCYTESUR NEGATIVE 05/06/2013 1131    ----------------------------------------------------------------------------------------------------------------  Imaging results:      Dg Chest Port 1 View  05/06/2013   *RADIOLOGY REPORT*  Clinical Data: Leukocytosis  PORTABLE CHEST - 1 VIEW  Comparison: None.  Findings: Lungs clear.  Heart size and pulmonary vascularity are normal.  No adenopathy.  No bone lesions.  IMPRESSION: No abnormality noted.   Original Report Authenticated By: Lowella Grip, M.D.       Assessment & Plan   1. UC flare - with bloody diarrhea - admit, IVF, clear liquids, IV steroids and IV cipro-flagyl, GI called to see. Type screen, monitor H&H.   2.Diarrhea induced Low K - replace IV and PO, recheck with Mag in PM.   3.Schizophrenia -  stable, on Zyprexa resumed.   4.Smoking and Polysubstance abuse - counseled.      DVT Prophylaxis  SCDs   AM Labs Ordered, also please review Full Orders  Family Communication: Admission, patients condition and plan of care including tests being ordered have been discussed with the patient  who indicates understanding and agree with the plan and Code Status.  Code Status Full  Likely DC to  Home  Time spent in minutes : 36  Condition Jill Side K M.D on 05/06/2013 at 12:57 PM  Between 7am to 7pm - Pager - [phone]  After 7pm go to [email or web address] - password [email or web address]  And look for the night coverage person covering me after hours  [facility] Office  [phone]

## 2013-05-06 NOTE — ED Notes (Signed)
Pt. Stated, I've been vomiting since Friday when i was discharged from the hospital. I've been vomiting and diarrhea.  I was admitted ulcer colitis

## 2013-05-06 NOTE — ED Notes (Signed)
Report to Marcelle Bebout,RN

## 2013-05-06 NOTE — Consult Note (Addendum)
[facility] Consult: 3:37 PM 05/06/2013   Referring Provider: Dr Candiss Norse Primary Care Physician:  No PCP Per Patient Primary Gastroenterologist: Jeani Sow in past.  Ardis Hughs in 04/21/12  Reason for Consultation:  diarrhea  HPI: [name] is a 32 y.o. male.    [name] is a 32 y.o. male. Hx ulcerative colitis. Diagnosed in 2006.  Pan colitis on colonoscopy. s/p flex sig with Dr. Benson Norway in 2/13 which showed severe left sided UC.  Seen 2 weeks ago as inpt consult for recurrent bloody diarrhea, narcotic requiring abdominal pain.  He had stopped taking all GI and psych meds for several months PTA.  Failed/slow to improve with Solumedrol, prednisone.  CT scan on 7/9 and 7/13 with colitis in descending colon.   Stool pathogen studies/C Diff were negative.  Always able to eat without problem, no nausea.  Went home day 7 on mesalamine suppositories, 1.6 grams tid of Asacol, 20 mg bid Prednisone, hydrocodone and hyoscyamine.   Also restarted on Zyprexa.    Readmitted 7/13 (within a few hours) - 7/18 with abdominal pain, nausea.  CT repeated and showed mild improvement and large stool burden.  Stools were less frequent, down ot 4 to 6 daily and were less bloody.  Treated with Miralax.  Elevated LFTs noted but improving.  Pt refused treatment by teaching service (as on earlier stay) and was managed by hospitalist.  Still c/o pain at discharge but was not provided with any additional  Narcotics at d/c.  Did get rx for ultram and Zofran.  Told to take 40 mg prednisone for 5 days, 20 mg thereafter.   He has history of depression, schizophrenia, and polysubstance abuse.  Tox screen + for cocaine, opiates, THC.  Readmitted from ED today with ongoing mid-abdominal pain, nausea and vomiting of bilious emesis, unable to keep down po's.  The stools are loose and still Orangey/bloody but only 4 per day.  Electrolytes deranged. Glucose elevated.  Says he has  been taking his oral and rectal Asacol and the Prednisone.      Past Medical History  Diagnosis Date  . Ulcerative colitis   . Schizophrenia   . GI (gastrointestinal bleed) 04/2013    Past Surgical History  Procedure Laterality Date  . Mandible fracture surgery    . Flexible sigmoidoscopy  12/05/2011    Procedure: FLEXIBLE SIGMOIDOSCOPY;  Surgeon: Beryle Beams, MD;  Location: [facility] ENDOSCOPY;  Service: Endoscopy;  Laterality: N/A;    Prior to Admission medications   Medication Sig Start Date End Date Taking? Authorizing Provider  Mesalamine (ASACOL) 400 MG CPDR Take 2 capsules (800 mg total) by mouth 3 (three) times daily. 05/02/13  Yes Janece Canterbury, MD  mesalamine (CANASA) 1000 MG suppository Place 1 suppository (1,000 mg total) rectally 2 (two) times daily. 05/02/13  Yes Janece Canterbury, MD  metoCLOPramide (REGLAN) 5 MG tablet Take 1 tablet (5 mg total) by mouth 3 (three) times daily before meals. 05/02/13  Yes Janece Canterbury, MD  ondansetron (ZOFRAN) 8 MG tablet Take 1 tablet (8 mg total) by mouth every 6 (six) hours as needed. 05/02/13  Yes Janece Canterbury, MD  polyethylene glycol (MIRALAX / GLYCOLAX) packet Take 17 g by mouth daily. 05/02/13  Yes Janece Canterbury, MD  predniSONE (DELTASONE) 10 MG tablet Take 2 tabs by mouth in the morning and 1 tab in the evening for 5 days, then take 2 tabs in the morning thereafter. 05/02/13  Yes Janece Canterbury, MD  simethicone (MYLICON) 80 MG chewable  tablet Chew 2 tablets (160 mg total) by mouth 4 (four) times daily as needed for flatulence (gas). 05/02/13  Yes Janece Canterbury, MD  traMADol (ULTRAM) 50 MG tablet Take 2 tablets (100 mg total) by mouth every 6 (six) hours as needed. 05/02/13  Yes Janece Canterbury, MD    Scheduled Meds: . ciprofloxacin  400 mg Intravenous Q12H  . Mesalamine  800 mg Oral TID  . mesalamine  1,000 mg Rectal BID  . methylPREDNISolone (SOLU-MEDROL) injection  60 mg Intravenous Daily  . metoCLOPramide  5 mg Oral TID AC   . metronidazole  500 mg Intravenous Q8H  . OLANZapine  5 mg Oral QHS  . polyethylene glycol  17 g Oral Daily  . potassium chloride  10 mEq Intravenous Q1 Hr x 4   Infusions: . sodium chloride     PRN Meds: albuterol, guaiFENesin-dextromethorphan, HYDROcodone-acetaminophen, ondansetron (ZOFRAN) IV, ondansetron, simethicone, traMADol   Allergies as of 05/06/2013 - Review Complete 05/06/2013  Allergen Reaction Noted  . Depakote (divalproex sodium) Other (See Comments) 06/17/2012  . Lithium Other (See Comments) 10/21/2012  . Nsaids Other (See Comments) 10/21/2012    History reviewed. No pertinent family history.  History   Social History  . Marital Status: Single    Spouse Name: N/A    Number of Children: N/A  . Years of Education: GED   Occupational History  . disabled    Social History Main Topics  . Smoking status: Current Every Day Smoker -- 0.25 packs/day for 20 years    Types: Cigarettes  . Smokeless tobacco: Never Used  . Alcohol Use: Yes     Comment: occasional  . Drug Use: Yes    Special: Cocaine, Marijuana     Comment: pt states he rarely uses cocaine  . Sexually Active: Not on file   Other Topics Concern  . Not on file   Social History Narrative   [name] was recently released from prison, currently living at home with his mother.  He is disabled (due to mental illness?).     REVIEW OF SYSTEMS:  Per HPI No oliguria.  No blood in vomit.  No headaches.  No falls, no dizziness, no headaches.    PHYSICAL EXAM: Vital signs in last 24 hours: Temp:  [98.2 F (36.8 C)-99.7 F (37.6 C)] 99.7 F (37.6 C) (07/22 1407) Pulse Rate:  [88-111] 93 (07/22 1407) Resp:  [10-20] 17 (07/22 1407) BP: (112-138)/(69-93) 122/83 mmHg (07/22 1407) SpO2:  [94 %-99 %] 97 % (07/22 1407) Weight:  [74.4 kg (164 lb 0.4 oz)] 74.4 kg (164 lb 0.4 oz) (07/22 1407)  General:  Not toxic Head:  No trauma or swellilng  Eyes:  No icterus, no pallor Ears:  Not HOH  Nose:  No  discharge Mouth:  Moist and clear Neck:  No masses Lungs:  Clear.  No labored breathing Heart: RRR Abdomen:  Soft, non-impressive, non-focal tenderness.  Hardly reacted to palpation when distracted by speaking to me..   Rectal: not repeated   Musc/Skeltl: no joint  swelling Extremities:  No edema  Neurologic:  Oriented x 3.  No tremor.  No gross deficits Skin:  No rash or sores Tattoos:   Nodes:  None at groin   Psych:  Pleasant, cooperative  Intake/Output from previous day:   Intake/Output this shift:    LAB RESULTS:  Recent Labs  05/06/13 0812  WBC 13.7*  HGB 12.9*  HCT 37.6*  PLT 504*   BMET Lab Results  Component Value  Date   NA 130* 05/06/2013   NA 135 04/30/2013   NA 136 04/28/2013   K 2.7* 05/06/2013   K 4.0 04/30/2013   K 4.5 04/28/2013   CL 87* 05/06/2013   CL 95* 04/30/2013   CL 96 04/28/2013   CO2 30 05/06/2013   CO2 31 04/30/2013   CO2 31 04/28/2013   GLUCOSE 195* 05/06/2013   GLUCOSE 124* 04/30/2013   GLUCOSE 150* 04/28/2013   BUN 18 05/06/2013   BUN 17 04/30/2013   BUN 18 04/28/2013   CREATININE 1.03 05/06/2013   CREATININE 0.78 04/30/2013   CREATININE 0.77 04/28/2013   CALCIUM 8.5 05/06/2013   CALCIUM 9.0 04/30/2013   CALCIUM 8.8 04/28/2013   LFT  Recent Labs  05/06/13 0812  PROT 5.5*  ALBUMIN 2.5*  AST 10  ALT 10  ALKPHOS 58  BILITOT 0.3   PT/INR Lab Results  Component Value Date   INR 1.32 12/17/2009   INR 1.3 12/20/2007   INR 1.3 06/17/2007     Ref. Range 04/21/2013 16:09 04/23/2013 17:23  C difficile by pcr Latest Range: NEGATIVE  NEGATIVE   Ova and parasites No range found  NO OVA OR PARASITES SEEN MODERATE YEAST  OVA AND PARASITE EXAMINATION No range found  Rpt     Drugs of Abuse     Component Value Date/Time   LABOPIA POSITIVE* 05/06/2013 1131   COCAINSCRNUR NONE DETECTED 05/06/2013 1131   LABBENZ NONE DETECTED 05/06/2013 1131   AMPHETMU NONE DETECTED 05/06/2013 1131   THCU NONE DETECTED 05/06/2013 1131   LABBARB NONE DETECTED 05/06/2013 1131      RADIOLOGY STUDIES: Dg Chest Port 1 View 05/06/2013   *RADIOLOGY REPORT*  Clinical Data: Leukocytosis  PORTABLE CHEST - 1 VIEW  Comparison: None.  Findings: Lungs clear.  Heart size and pulmonary vascularity are normal.  No adenopathy.  No bone lesions.  IMPRESSION: No abnormality noted.   Original Report Authenticated By: Lowella Grip, M.D.    ENDOSCOPIC STUDIES:   11/2011 Flex sig Dr Benson Norway  Severe colitis from dentate line to splenic flexure, normal proximal to sf. Hemorrhoids.  Plan: Asacol 4.8 grams daily and prednisone.   2006 Colonoscopy Dr Vladimir Faster  universal ulcerative colitis   IMPRESSION: *  Ulcerative colitis, stool frequency better but n/v is now the issue.     PLAN: *  Per Dr Carlean Purl.  Will let him  Decide if pt needs another CT or KUB...    LOS: 0 days   Azucena Freed  05/06/2013, 3:37 PM Pager: [phone]     [facility] Attending  I have also seen and assessed the patient and agree with the above note.  Bad combo of mental illness and significant chronic medical problem (UC). He seems to be ok now. Will check abd films. Will dc antibiotics.  He is a Medicare patient and if [facility] can follow him that may help.  Gatha Mayer, MD, [facility] [phone] (pager) 05/06/2013 6:06 PM

## 2013-05-07 DIAGNOSIS — K5190 Ulcerative colitis, unspecified, without complications: Principal | ICD-10-CM

## 2013-05-07 LAB — BASIC METABOLIC PANEL
BUN: 11 mg/dL (ref 6–23)
Creatinine, Ser: 0.86 mg/dL (ref 0.50–1.35)
GFR calc Af Amer: >90 mL/min (ref >90)
GFR calc non Af Amer: >90 mL/min (ref >90)
Glucose, Bld: 118 mg/dL — ABNORMAL HIGH (ref 70–99)

## 2013-05-07 LAB — CBC
HCT: 30.2 % — ABNORMAL LOW (ref 39.0–52.0)
MCH: 28 pg (ref 26.0–34.0)
MCHC: 33.8 g/dL (ref 30.0–36.0)
RDW: 15.4 % (ref 11.5–15.5)

## 2013-05-07 MED ORDER — ONDANSETRON HCL 4 MG/2ML IJ SOLN
4.0000 mg | Freq: Four times a day (QID) | INTRAMUSCULAR | Status: DC | PRN
  Administered 2013-05-07 – 2013-05-08 (×3): 4 mg via INTRAVENOUS
  Filled 2013-05-07 (×4): qty 2

## 2013-05-07 MED ORDER — POTASSIUM CHLORIDE IN NACL 40-0.9 MEQ/L-% IV SOLN
INTRAVENOUS | Status: DC
  Administered 2013-05-07 – 2013-05-08 (×3): via INTRAVENOUS
  Filled 2013-05-07 (×4): qty 1000

## 2013-05-07 MED ORDER — ONDANSETRON HCL 4 MG PO TABS: 4.0000 mg | ORAL_TABLET | ORAL | Status: DC | PRN

## 2013-05-07 MED ORDER — PREDNISONE 20 MG PO TABS
40.0000 mg | ORAL_TABLET | Freq: Every day | ORAL | Status: DC
  Administered 2013-05-08: 40 mg via ORAL
  Filled 2013-05-07 (×3): qty 2

## 2013-05-07 MED ORDER — LIDOCAINE 4 % EX CREA
TOPICAL_CREAM | Freq: Three times a day (TID) | CUTANEOUS | Status: DC | PRN
  Administered 2013-05-07: 1 via TOPICAL
  Filled 2013-05-07: qty 5

## 2013-05-07 NOTE — Progress Notes (Signed)
[facility] Daily Rounding Note 05/07/2013, 10:07 AM  SUBJECTIVE:       Now says he had 8 small "back to back" stools this AM.  Not clearly bloody.  Still with nausea and abdominal pain  OBJECTIVE:         Vital signs in last 24 hours:    Temp:  [98.5 F (36.9 C)-99.7 F (37.6 C)] 98.5 F (36.9 C) (07/23 0538) Pulse Rate:  [80-100] 94 (07/23 0538) Resp:  [14-20] 16 (07/23 0538) BP: (104-128)/(54-83) 104/57 mmHg (07/23 0538) SpO2:  [94 %-99 %] 99 % (07/23 0538) Weight:  [74.4 kg (164 lb 0.4 oz)-75 kg (165 lb 5.5 oz)] 75 kg (165 lb 5.5 oz) (07/23 0538) Last BM Date: 05/06/13 General: NAD   Heart: RRR Chest: clear bil Abdomen: soft, not-tender, BS active, not distended  Extremities: no CCE Neuro/Psych:  Pleasant, simple.  No agitation  Intake/Output from previous day: 07/22 0701 - 07/23 0700 In: 2248.3 [P.O.:480; I.V.:1068.3; IV Piggyback:700] Out: 550 [Urine:550]  Intake/Output this shift:    Lab Results:  Recent Labs  05/06/13 0812 05/06/13 1602 05/07/13 0530  WBC 13.7*  --  12.1*  HGB 12.9* 11.5* 10.2*  HCT 37.6* 33.7* 30.2*  PLT 504*  --  436*   BMET  Recent Labs  05/06/13 0812 05/06/13 1602 05/07/13 0530  NA 130*  --  133*  K 2.7* 3.4* 3.4*  CL 87*  --  97  CO2 30  --  29  GLUCOSE 195*  --  118*  BUN 18  --  11  CREATININE 1.03  --  0.86  CALCIUM 8.5  --  8.1*    Dg Chest Port 1 View  05/06/2013   *RADIOLOGY REPORT*  Clinical Data: Leukocytosis  PORTABLE CHEST - 1 VIEW  Comparison: None.  Findings: Lungs clear.  Heart size and pulmonary vascularity are normal.  No adenopathy.  No bone lesions.  IMPRESSION: No abnormality noted.   Original Report Authenticated By: Lowella Grip, M.D.   Dg Abd 2 Views 05/06/2013   *RADIOLOGY REPORT*  Clinical Data: Nausea and vomiting.  Ulcerative colitis.  ABDOMEN - 2 VIEW  Comparison: 04/20/2013  Findings: Moderate stool burden in the ascending colon.  No free intraperitoneal gas.  Diffuse gaseous distention  of both small and large bowel in an ileus pattern.  Sigmoid colon gas is present. The sigmoid colon wall is irregular and thickened consistent with the findings noted on the recent CT abdomen.  There is no obvious pneumatosis.  Phleboliths project over the pelvis.  IMPRESSION: Diffuse gaseous distention as described.  Colonic wall irregularity and thickening in the sigmoid colon is noted as previously described on the CT.  No free intraperitoneal gas or pneumatosis.   Original Report Authenticated By: Marybelle Killings, M.D.    ASSESMENT: *  Ulcerative colitis.  Continues on oral, rectal mesalamine.  Solumedrol restarted yesterday.  Stools increased after starting Reglan.  Only having 4 per day at home however.  *  Persistent N/V.  This was not an issue earlier in his initial presentation and first admission.  Ileus on plain films. Po Reglan tid started.  Admission # 2 of 3 began meds for constipation. Hydrocodone contributing.    PLAN: *  Change back to Prednisone, 40 mg daily.  Leave on clears.    LOS: 1 day   Azucena Freed  05/07/2013, 10:07 AM Pager: [phone]  Addendum:  10 minutes after seeing pt I get call from RN saying pt  wants to advance his diet.... He just got through telling me how he was nauseated.  Will indulge his desire and go for full liquids.   SG   [facility] Attending  I have also seen and assessed the patient and agree with the above note  He now reports less diarrhea, no nausea and vomiting and wants solid food in AM. Have ordered reg diet and added lidocaine cream for anal soreness.  He may be ok to dc tomorrow - would treat with prednisone 40 mg daily and mesalamine  As well as his psych med and see outpatient GI as already planned.  If there are other ?'s please call us back - signing off.  Gatha Mayer, MD, [facility] [phone] (pager) 05/07/2013 6:22 PM

## 2013-05-07 NOTE — Progress Notes (Signed)
[facility] PROGRESS NOTE  [name] FFM:[id number] DOB: 08/22/1981 DOA: 05/06/2013 PCP: No PCP Per Patient  Assessment/Plan:  Active Problems:   HYPOKALEMIA   SUBSTANCE ABUSE, MULTIPLE   ULCERATIVE COLITIS   Diarrhea   Schizophrenia  Improving.  Appreciate GI. K+ improved  HPI/Subjective: Feels better. Wants solid food.  Objective: Filed Vitals:   05/06/13 1407 05/06/13 2151 05/07/13 0538 05/07/13 1411  BP: 122/83 118/54 104/57 116/73  Pulse: 93 80 94 103  Temp: 99.7 F (37.6 C) 99.2 F (37.3 C) 98.5 F (36.9 C) 99 F (37.2 C)  TempSrc: Oral Oral Oral Oral  Resp: 17 17 16 15   Height: 6' 1"  (1.854 m)     Weight: 74.4 kg (164 lb 0.4 oz)  75 kg (165 lb 5.5 oz)   SpO2: 97% 97% 99% 100%    Intake/Output Summary (Last 24 hours) at 05/07/13 1418 Last data filed at 05/07/13 0540  Gross per 24 hour  Intake 2148.34 ml  Output    550 ml  Net 1598.34 ml   Filed Weights   05/06/13 1407 05/07/13 0538  Weight: 74.4 kg (164 lb 0.4 oz) 75 kg (165 lb 5.5 oz)    Exam:   General:  Comfortable. cooperative  Cardiovascular: RRR without MGR  Respiratory: CTA without WRR  Abdomen: S, NT, ND  Musculoskeletal: no CCE  Data Reviewed: Basic Metabolic Panel:  Recent Labs Lab 05/06/13 0812 05/06/13 1602 05/07/13 0530  NA 130*  --  133*  K 2.7* 3.4* 3.4*  CL 87*  --  97  CO2 30  --  29  GLUCOSE 195*  --  118*  BUN 18  --  11  CREATININE 1.03  --  0.86  CALCIUM 8.5  --  8.1*  MG  --  1.8  --    Liver Function Tests:  Recent Labs Lab 05/06/13 0812  AST 10  ALT 10  ALKPHOS 58  BILITOT 0.3  PROT 5.5*  ALBUMIN 2.5*    Recent Labs Lab 05/06/13 0812  LIPASE 10*   No results found for this basename: AMMONIA,  in the last 168 hours CBC:  Recent Labs Lab 05/06/13 0812 05/06/13 1602 05/07/13 0530  WBC 13.7*  --  12.1*  NEUTROABS 9.9*  --   --   HGB 12.9* 11.5* 10.2*  HCT 37.6* 33.7* 30.2*  MCV 82.6  --  83.0  PLT 504*  --  436*   Cardiac  Enzymes: No results found for this basename: CKTOTAL, CKMB, CKMBINDEX, TROPONINI,  in the last 168 hours BNP (last 3 results) No results found for this basename: PROBNP,  in the last 8760 hours CBG: No results found for this basename: GLUCAP,  in the last 168 hours  No results found for this or any previous visit (from the past 240 hour(s)).   Studies: Dg Chest Port 1 View  05/06/2013   *RADIOLOGY REPORT*  Clinical Data: Leukocytosis  PORTABLE CHEST - 1 VIEW  Comparison: None.  Findings: Lungs clear.  Heart size and pulmonary vascularity are normal.  No adenopathy.  No bone lesions.  IMPRESSION: No abnormality noted.   Original Report Authenticated By: Lowella Grip, M.D.   Dg Abd 2 Views  05/06/2013   *RADIOLOGY REPORT*  Clinical Data: Nausea and vomiting.  Ulcerative colitis.  ABDOMEN - 2 VIEW  Comparison: 04/20/2013  Findings: Moderate stool burden in the ascending colon.  No free intraperitoneal gas.  Diffuse gaseous distention of both small and large bowel in an ileus pattern.  Sigmoid colon gas is present. The sigmoid colon wall is irregular and thickened consistent with the findings noted on the recent CT abdomen.  There is no obvious pneumatosis.  Phleboliths project over the pelvis.  IMPRESSION: Diffuse gaseous distention as described.  Colonic wall irregularity and thickening in the sigmoid colon is noted as previously described on the CT.  No free intraperitoneal gas or pneumatosis.   Original Report Authenticated By: Marybelle Killings, M.D.    Scheduled Meds: . Mesalamine  800 mg Oral TID  . mesalamine  1,000 mg Rectal BID  . metoCLOPramide  5 mg Oral TID AC  . OLANZapine  5 mg Oral QHS  . polyethylene glycol  17 g Oral Daily  . [START ON 05/08/2013] predniSONE  40 mg Oral Q breakfast   Continuous Infusions:   Time spent: [facility] Pager [phone]. If 7PM-7AM, please contact night-coverage at [email or web address], password [facility] 05/07/2013, 2:18 PM  LOS:  1 day

## 2013-05-07 NOTE — Progress Notes (Signed)
Patient c/o nausea. Zofran given at 0415. Nothing else can be given at this time. Scheduled Reglan given at 0730. MD paged and made aware. No new orders at this time.

## 2013-05-08 DIAGNOSIS — R112 Nausea with vomiting, unspecified: Secondary | ICD-10-CM

## 2013-05-08 LAB — BASIC METABOLIC PANEL
BUN: 10 mg/dL (ref 6–23)
Calcium: 7.7 mg/dL — ABNORMAL LOW (ref 8.4–10.5)
GFR calc Af Amer: >90 mL/min (ref >90)
GFR calc non Af Amer: >90 mL/min (ref >90)
Glucose, Bld: 93 mg/dL (ref 70–99)
Potassium: 3.7 mEq/L (ref 3.5–5.1)

## 2013-05-08 MED ORDER — POTASSIUM CHLORIDE ER 10 MEQ PO TBCR: 10.0000 meq | EXTENDED_RELEASE_TABLET | Freq: Two times a day (BID) | ORAL | Status: DC

## 2013-05-08 MED ORDER — OLANZAPINE 5 MG PO TABS: 5.0000 mg | ORAL_TABLET | Freq: Every day | ORAL | Status: DC

## 2013-05-08 MED ORDER — PREDNISONE 20 MG PO TABS: 40.0000 mg | ORAL_TABLET | Freq: Every day | ORAL | Status: DC

## 2013-05-08 NOTE — Discharge Summary (Signed)
Physician Discharge Summary  [name] HWT:[id number] DOB: 1981/10/02 DOA: 05/06/2013  PCP: No PCP Per Patient  Admit date: 05/06/2013 Discharge date: 05/08/2013  Time spent: less than 30 minutes  Discharge Diagnoses:  Active Problems:   HYPOKALEMIA   SUBSTANCE ABUSE, MULTIPLE   ULCERATIVE COLITIS   Diarrhea   Schizophrenia  Discharge Condition: stable  Filed Weights   05/06/13 1407 05/07/13 0538 05/08/13 0529  Weight: 74.4 kg (164 lb 0.4 oz) 75 kg (165 lb 5.5 oz) 75.1 kg (165 lb 9.1 oz)    History of present illness:  [name] is a 32 y.o. male, with H/O UC x 13 yrs, polysubstance abuse, smoking, and Schizophrenia 2 recent admits for UC flare, was DC'd few days ago on Asacol and Steroids comes back with some N&V but > 20 blood BMS, stool is loose with mucous and some red blood, no pain as such, in the ER has Leukocytosis, Low K, he is afebrile, he was diagnosed with recurrent UC flare and I was called to admit him, his schizophrenia is stable, no hallucinations, no cocaine in the last month  Hospital Course:  Admitted, started on steroids, clear liquids.  GI consulted.  Symptoms improved. Pt requested advancement of diet. K corrected. Labs and vitals stable.  At discharge, feeling better, tolerating solids and requesting discharge.  Procedures:  none  Consultations:  [facility]  Discharge Exam: Filed Vitals:   05/07/13 0538 05/07/13 1411 05/07/13 2157 05/08/13 0529  BP: 104/57 116/73 108/52 109/58  Pulse: 94 103 97 86  Temp: 98.5 F (36.9 C) 99 F (37.2 C) 99.9 F (37.7 C) 98.6 F (37 C)  TempSrc: Oral Oral Oral Oral  Resp: 16 15 17 16   Height:      Weight: 75 kg (165 lb 5.5 oz)   75.1 kg (165 lb 9.1 oz)  SpO2: 99% 100% 98% 100%    General: alert, oriented, cooperative Abd:  Soft, nontender, nondistended  Discharge Instructions  Discharge Orders   Future Appointments Provider Department Dept Phone   05/21/2013 10:00 AM Milus Banister, MD [facility] [phone]   Future Orders Complete By Expires     Activity as tolerated - No restrictions  As directed     Diet general  As directed         Medication List         Mesalamine 400 MG Cpdr  Commonly known as:  ASACOL  Take 2 capsules (800 mg total) by mouth 3 (three) times daily.     mesalamine 1000 MG suppository  Commonly known as:  CANASA  Place 1 suppository (1,000 mg total) rectally 2 (two) times daily.     metoCLOPramide 5 MG tablet  Commonly known as:  REGLAN  Take 1 tablet (5 mg total) by mouth 3 (three) times daily before meals.     OLANZapine 5 MG tablet  Commonly known as:  ZYPREXA  Take 1 tablet (5 mg total) by mouth at bedtime.     ondansetron 8 MG tablet  Commonly known as:  ZOFRAN  Take 1 tablet (8 mg total) by mouth every 6 (six) hours as needed.     polyethylene glycol packet  Commonly known as:  MIRALAX / GLYCOLAX  Take 17 g by mouth daily.     predniSONE 20 MG tablet  Commonly known as:  DELTASONE  Take 2 tablets (40 mg total) by mouth daily with breakfast.     simethicone 80 MG chewable tablet  Commonly known  as:  MYLICON  Chew 2 tablets (160 mg total) by mouth 4 (four) times daily as needed for flatulence (gas).     traMADol 50 MG tablet  Commonly known as:  ULTRAM  Take 2 tablets (100 mg total) by mouth every 6 (six) hours as needed.      KDUR 10 meq po bid  Allergies  Allergen Reactions  . Depakote (Divalproex Sodium) Other (See Comments)    Reaction: seizures   . Lithium Other (See Comments)    Reaction: rectal bleeding  . Nsaids Other (See Comments)    Reaction: rectal bleeding       Follow-up Information   Follow up with [facility]    .   Contact information:   [address] [place] [address]       Follow up with Silvano Rusk, MD In 2 weeks.   Contact information:   [address] [place] [address] [phone]       Follow up with  Derrel Nip, MD In 1 month. (or your psychiatrist)    Contact information:   [facility] [address] [place] [address] [phone]        The results of significant diagnostics from this hospitalization (including imaging, microbiology, ancillary and laboratory) are listed below for reference.    Significant Diagnostic Studies: Ct Abdomen Pelvis W Contrast  04/27/2013   *RADIOLOGY REPORT*  Clinical Data: Nausea, vomiting and diarrhea.  History of ulcerative colitis.  CT ABDOMEN AND PELVIS WITH CONTRAST  Technique:  Multidetector CT imaging of the abdomen and pelvis was performed following the standard protocol during bolus administration of intravenous contrast.  Contrast: 172m OMNIPAQUE IOHEXOL 300 MG/ML  SOLN  Comparison: CT abdomen and pelvis 04/23/2013.  Findings: The lung bases are clear.  No pleural or pericardial effusion.  As on prior study, there is some thickening of the walls of the descending colon through the rectosigmoid consistent with colitis. Pericolonic inflammatory change about the descending colon appears slightly improved.  A large volume of stool is present in the ascending and transverse colon. No focal fluid collection or free intraperitoneal air is present.  The appendix appears normal.  The stomach and small bowel are unremarkable.  The liver, gallbladder, adrenal glands, spleen, pancreas, kidneys and biliary tree all appear normal.  Scattered small lymph nodes in the root of the mesentery and retroperitoneum are again seen measuring up to 1.1 cm short axis dimension in the left periaortic station on image 50.  No focal bony abnormality is identified.  IMPRESSION:  1.  Changes of colitis involving the descending colon through the rectosigmoid persist but appear mildly improved since the study 4 days ago.  There is no abscess or other new abnormality. 2.  Small retroperitoneal and mesenteric lymph nodes are stable in appearance and likely  reactive. 3.  Large stool burden ascending and proximal transverse colon.   Original Report Authenticated By: TOrlean Patten M.D.   Ct Abdomen Pelvis W Contrast  04/23/2013   *RADIOLOGY REPORT*  Clinical Data: Abdominal pain, left side greater than right. Bloody diarrhea.  Nausea vomiting.  Colitis.  CT ABDOMEN AND PELVIS WITH CONTRAST  Technique:  Multidetector CT imaging of the abdomen and pelvis was performed following the standard protocol during bolus administration of intravenous contrast.  Contrast: 1013mOMNIPAQUE IOHEXOL 300 MG/ML  SOLN  Comparison: 12/01/2011  Findings: Moderate wall thickening is seen involving the descending and sigmoid portions of  the colon which appears contiguous from the level of the splenic flexure to the rectum.  This is increased since previous study and is consistent with colitis.  There is no evidence of pneumatosis, abscess, or free fluid.  No evidence of bowel obstruction.  No evidence of bowel wall thickening involving the distal ileum or small bowel.  Normal appendix is visualized.  The liver, spleen, pancreas, gallbladder, adrenal glands, and kidneys are normal in appearance.  No evidence of hydronephrosis. Retroaortic left renal vein is incidentally noted. Mild lymphadenopathy is again seen in the in the left para-aortic region, with largest measuring 11 mm in short axis. Mild lymphadenopathy is also seen in the sigmoid mesocolon.  These are stable since prior exam.  No other soft tissue masses are identified.  No suspicious bone lesions identified.  IMPRESSION:  1. Moderate left-sided colitis involving the descending and sigmoid colon, which is increased since previous study. Differential diagnosis includes ulcerative colitis and infectious colitis. 2.  Stable mild lymphadenopathy in the left para-aortic region and sigmoid mesocolon.  This is nonspecific, but may be reactive in etiology.   Original Report Authenticated By: Earle Gell, M.D.   Dg Chest Port 1  View  05/06/2013   *RADIOLOGY REPORT*  Clinical Data: Leukocytosis  PORTABLE CHEST - 1 VIEW  Comparison: None.  Findings: Lungs clear.  Heart size and pulmonary vascularity are normal.  No adenopathy.  No bone lesions.  IMPRESSION: No abnormality noted.   Original Report Authenticated By: Lowella Grip, M.D.   Dg Abd 2 Views  05/06/2013   *RADIOLOGY REPORT*  Clinical Data: Nausea and vomiting.  Ulcerative colitis.  ABDOMEN - 2 VIEW  Comparison: 04/20/2013  Findings: Moderate stool burden in the ascending colon.  No free intraperitoneal gas.  Diffuse gaseous distention of both small and large bowel in an ileus pattern.  Sigmoid colon gas is present. The sigmoid colon wall is irregular and thickened consistent with the findings noted on the recent CT abdomen.  There is no obvious pneumatosis.  Phleboliths project over the pelvis.  IMPRESSION: Diffuse gaseous distention as described.  Colonic wall irregularity and thickening in the sigmoid colon is noted as previously described on the CT.  No free intraperitoneal gas or pneumatosis.   Original Report Authenticated By: Marybelle Killings, M.D.   Dg Abd 2 Views  04/20/2013   *RADIOLOGY REPORT*  Clinical Data: Abdominal pain  ABDOMEN - 2 VIEW  Comparison: 12/01/2011  Findings: The bowel gas pattern is nonobstructed.  Gas and stool noted throughout the colon up to the rectum.  No abnormal abdominal or pelvic calcifications.  IMPRESSION:  1.  Nonobstructive bowel gas pattern.   Original Report Authenticated By: Kerby Moors, M.D.    Microbiology: No results found for this or any previous visit (from the past 240 hour(s)).   Labs: Basic Metabolic Panel:  Recent Labs Lab 05/06/13 0812 05/06/13 1602 05/07/13 0530 05/08/13 0600  NA 130*  --  133* 138  K 2.7* 3.4* 3.4* 3.7  CL 87*  --  97 105  CO2 30  --  29 28  GLUCOSE 195*  --  118* 93  BUN 18  --  11 10  CREATININE 1.03  --  0.86 0.94  CALCIUM 8.5  --  8.1* 7.7*  MG  --  1.8  --   --    Liver  Function Tests:  Recent Labs Lab 05/06/13 0812  AST 10  ALT 10  ALKPHOS 58  BILITOT 0.3  PROT 5.5*  ALBUMIN 2.5*    Recent Labs Lab 05/06/13 0812  LIPASE 10*   No results found for this basename: AMMONIA,  in the last 168 hours CBC:  Recent Labs Lab 05/06/13 0812 05/06/13 1602 05/07/13 0530  WBC 13.7*  --  12.1*  NEUTROABS 9.9*  --   --   HGB 12.9* 11.5* 10.2*  HCT 37.6* 33.7* 30.2*  MCV 82.6  --  83.0  PLT 504*  --  436*   Cardiac Enzymes: No results found for this basename: CKTOTAL, CKMB, CKMBINDEX, TROPONINI,  in the last 168 hours BNP: BNP (last 3 results) No results found for this basename: PROBNP,  in the last 8760 hours CBG: No results found for this basename: GLUCAP,  in the last 168 hours  Signed:  [place] L  [facility] 05/08/2013, 11:22 AM

## 2013-05-08 NOTE — ED Provider Notes (Signed)
Medical screening examination/treatment/procedure(s) were conducted as a shared visit with non-physician practitioner(s) and myself.  I personally evaluated the patient during the encounter   Julianne Rice, MD 05/08/13 [phone]

## 2013-05-08 NOTE — Discharge Planning (Signed)
Copy of home instructions to patient who verbalizes understanding.  Discharged to private car with all personal belongings, ambulatory, acomp. By friend at 30.

## 2013-05-09 LAB — TYPE AND SCREEN
ABO/RH(D): A POS
Antibody Screen: NEGATIVE
Unit division: 0

## 2013-05-21 ENCOUNTER — Encounter: Payer: Self-pay | Admitting: Gastroenterology

## 2013-05-21 ENCOUNTER — Ambulatory Visit (INDEPENDENT_AMBULATORY_CARE_PROVIDER_SITE_OTHER): Payer: Medicare Other | Admitting: Gastroenterology

## 2013-05-21 ENCOUNTER — Other Ambulatory Visit (INDEPENDENT_AMBULATORY_CARE_PROVIDER_SITE_OTHER): Payer: Medicare Other

## 2013-05-21 VITALS — BP 100/60 | HR 117 | Ht 72.5 in | Wt 164.2 lb

## 2013-05-21 DIAGNOSIS — K5190 Ulcerative colitis, unspecified, without complications: Secondary | ICD-10-CM

## 2013-05-21 DIAGNOSIS — K51919 Ulcerative colitis, unspecified with unspecified complications: Secondary | ICD-10-CM

## 2013-05-21 LAB — CBC WITH DIFFERENTIAL/PLATELET
Basophils Relative: 0 % (ref 0.0–3.0)
Eosinophils Relative: 0 % (ref 0.0–5.0)
Lymphocytes Relative: 7.9 % — ABNORMAL LOW (ref 12.0–46.0)
MCV: 87.4 fl (ref 78.0–100.0)
Monocytes Absolute: 0.5 10*3/uL (ref 0.1–1.0)
Monocytes Relative: 6.7 % (ref 3.0–12.0)
Neutrophils Relative %: 85.4 % — ABNORMAL HIGH (ref 43.0–77.0)
Platelets: 722 10*3/uL — ABNORMAL HIGH (ref 150.0–400.0)
RBC: 2.84 Mil/uL — ABNORMAL LOW (ref 4.22–5.81)
WBC: 7.6 10*3/uL (ref 4.5–10.5)

## 2013-05-21 LAB — BASIC METABOLIC PANEL
BUN: 13 mg/dL (ref 6–23)
Calcium: 8 mg/dL — ABNORMAL LOW (ref 8.4–10.5)
Chloride: 105 mEq/L (ref 96–112)
Creatinine, Ser: 0.9 mg/dL (ref 0.4–1.5)
GFR: 117.76 mL/min (ref >60.00)

## 2013-05-21 MED ORDER — MESALAMINE 1.2 G PO TBEC: 4.8000 g | DELAYED_RELEASE_TABLET | Freq: Every day | ORAL | Status: DC

## 2013-05-21 MED ORDER — PREDNISONE 10 MG PO TABS: 20.0000 mg | ORAL_TABLET | Freq: Two times a day (BID) | ORAL | Status: DC

## 2013-05-21 NOTE — Patient Instructions (Addendum)
You have two new prescriptions to pick up. Prednisone, 77m pills.  Take 2 pills twice daily. Lialda 1.2g pills. Take 4 pills once daily. Please return to see Dr. JArdis Hughsin 6 weeks, sooner if needed. You will have labs checked today in the basement lab.  Please head down after you check out with the front desk  (bmet, cbc). [facility] will not be prescribing any narcotic pain medicines for you now or in the future.                                               We are excited to introduce MyChart, a new best-in-class service that provides you online access to important information in your electronic medical record. We want to make it easier for you to view your health information - all in one secure location - when and where you need it. We expect MyChart will enhance the quality of care and service we provide.  When you register for MyChart, you can:    View your test results.    Request appointments and receive appointment reminders via email.    Request medication renewals.    View your medical history, allergies, medications and immunizations.    Communicate with your physician's office through a password-protected site.    Conveniently print information such as your medication lists.  To find out if MyChart is right for you, please talk to a member of our clinical staff today. We will gladly answer your questions about this free health and wellness tool.  If you are age 4093or older and want a member of your family to have access to your record, you must provide written consent by completing a proxy form available at our office. Please speak to our clinical staff about guidelines regarding accounts for patients younger than age 34  As you activate your MyChart account and need any technical assistance, please call the MyChart technical support line at [phone] ([phone] or email your question to [email or web address]. If you email your question(s), please include your  name, a return phone number and the best time to reach you.  If you have non-urgent health-related questions, you can send a message to our office through M[facility]at [email or web address] If you have a medical emergency, call 911.  Thank you for using MyChart as your new health and wellness resource!   MyChart licensed from [employer]  1999-2010. Patents Pending.

## 2013-05-21 NOTE — Progress Notes (Signed)
Review of pertinent gastrointestinal problems: 1. Hx ulcerative colitis. Diagnosed in 2006. Pan colitis on colonoscopy. s/p flex sig with Dr. Benson Norway in 2/13 which showed severe left sided UC. Seen 2 weeks ago as inpt consult for recurrent bloody diarrhea, narcotic requiring abdominal pain. He had stopped taking all GI and psych meds for several months PTA. Failed/slow to improve with Solumedrol, prednisone. CT scan on 7/9 and 7/13 with colitis in descending colon. Stool pathogen studies/C Diff were negative. Always able to eat without problem, no nausea. Went home day 7 on mesalamine suppositories, 1.6 grams tid of Asacol, 20 mg bid Prednisone, hydrocodone and hyoscyamine. Also restarted on Zyprexa.  2. Polysubstance abuse, has been question about drug seeking behavior at times 3. Medical non-compliance 4. Schizophrenia   HPI: This is a pleasant 32 year old man whom I met I believe for the first time about a month ago when he was hospitalized.  He has essentially floated from GI caregiver to GI caregiver over the past several years. Most of this was through the [employer].  He has significant psychiatric illnesses, history of polysubstance abuse that makes control, treatment of his medical issues much more complex.  Since discharge from the hospital about 2 weeks ago he has really not been taking any of the medicines that were recommended except prednisone.  Has bloody diarrhea, 3-4 times per day.  Has urgency at times.  Nocturnal symptoms at times.  He is on prednisone daily.  He has not been taking asacol at all since he left the hospital.  He stopped suppositories as well.  Not taking them at all.    Past Medical History  Diagnosis Date  . Ulcerative colitis   . Schizophrenia   . GI (gastrointestinal bleed) 04/2013  . Crohn's disease     Past Surgical History  Procedure Laterality Date  . Mandible fracture surgery    . Flexible sigmoidoscopy  12/05/2011    Procedure:  FLEXIBLE SIGMOIDOSCOPY;  Surgeon: Beryle Beams, MD;  Location: [facility] ENDOSCOPY;  Service: Endoscopy;  Laterality: N/A;    Current Outpatient Prescriptions  Medication Sig Dispense Refill  . Mesalamine (ASACOL) 400 MG CPDR Take 2 capsules (800 mg total) by mouth 3 (three) times daily.  180 capsule  0  . mesalamine (CANASA) 1000 MG suppository Place 1 suppository (1,000 mg total) rectally 2 (two) times daily.  60 suppository  0  . metoCLOPramide (REGLAN) 5 MG tablet Take 1 tablet (5 mg total) by mouth 3 (three) times daily before meals.  90 tablet  0  . OLANZapine (ZYPREXA) 5 MG tablet Take 1 tablet (5 mg total) by mouth at bedtime.      . ondansetron (ZOFRAN) 8 MG tablet Take 1 tablet (8 mg total) by mouth every 6 (six) hours as needed.  30 tablet  0  . polyethylene glycol (MIRALAX / GLYCOLAX) packet Take 17 g by mouth daily.  100 each  0  . potassium chloride (K-DUR) 10 MEQ tablet Take 1 tablet (10 mEq total) by mouth 2 (two) times daily.  60 tablet  0  . predniSONE (DELTASONE) 20 MG tablet Take 2 tablets (40 mg total) by mouth daily with breakfast.  60 tablet  0  . simethicone (MYLICON) 80 MG chewable tablet Chew 2 tablets (160 mg total) by mouth 4 (four) times daily as needed for flatulence (gas).  30 tablet  0  . traMADol (ULTRAM) 50 MG tablet Take 2 tablets (100 mg total) by mouth every 6 (six) hours as  needed.  60 tablet  0   No current facility-administered medications for this visit.   Facility-Administered Medications Ordered in Other Visits  Medication Dose Route Frequency Provider Last Rate Last Dose  . hydrOXYzine (ATARAX/VISTARIL) tablet 50 mg  50 mg Oral TID PRN Encarnacion Slates, NP      . magnesium hydroxide (MILK OF MAGNESIA) suspension 30 mL  30 mL Oral Daily PRN Encarnacion Slates, NP        Allergies as of 05/21/2013 - Review Complete 05/21/2013  Allergen Reaction Noted  . Depakote (divalproex sodium) Other (See Comments) 06/17/2012  . Lithium Other (See Comments) 10/21/2012  .  Nsaids Other (See Comments) 10/21/2012    Family History  Problem Relation Age of Onset  . Liver cancer      History   Social History  . Marital Status: Single    Spouse Name: N/A    Number of Children: N/A  . Years of Education: GED   Occupational History  . disabled    Social History Main Topics  . Smoking status: Current Every Day Smoker -- 0.25 packs/day for 20 years    Types: Cigarettes  . Smokeless tobacco: Never Used  . Alcohol Use: Yes     Comment: occasional  . Drug Use: Yes    Special: Cocaine, Marijuana     Comment: pt states he rarely uses cocaine  . Sexually Active: Not on file   Other Topics Concern  . Not on file   Social History Narrative   Mr. Ricketson was recently released from prison, currently living at home with his mother.  He is disabled (due to mental illness?).       Physical Exam: BP 100/60  Pulse 117  Ht 6' 0.5" (1.842 m)  Wt 164 lb 3.2 oz (74.481 kg)  BMI 21.95 kg/m2  SpO2 98% Constitutional: generally well-appearing Psychiatric: alert and oriented x3 Abdomen: soft, nontender, nondistended, no obvious ascites, no peritoneal signs, normal bowel sounds     Assessment and plan: 32 y.o. male with ulcerative colitis  Multiple psychiatric issues, polysubstance abuse factors that make medical treatment very difficult. He has not been taking really any of the medicines that were recommended form since discharge. Tell he cannot afford them  Covered by Medicaid. I look into this and I am switching his mesalamine formulation to Lialda from asacol since it looks like it will be covered.  I went over those with him that I want him taking verbally in the office as well as wrote down for him and in several places.  I also changed his prednisone dosing to 10 mg pills. He'll take 2 of these twice daily. He will return to see me in 4-6 weeks, sooner if needed. I was very clear to him that this practice we'll not be prescribing any narcotic pain  medicines for him now or in the future. Ideally I would like to transition him to immunomodulators however with his social situation that may just be impossible to trust that he was taking those very strong, serious medicines reliably. We will have to see how he complies with my recommendations over the next several months.

## 2013-05-22 ENCOUNTER — Encounter (HOSPITAL_COMMUNITY): Payer: Self-pay | Admitting: Emergency Medicine

## 2013-05-22 ENCOUNTER — Other Ambulatory Visit: Payer: Self-pay

## 2013-05-22 ENCOUNTER — Inpatient Hospital Stay (HOSPITAL_COMMUNITY)
Admission: EM | Admit: 2013-05-22 | Discharge: 2013-05-25 | DRG: 387 | Disposition: A | Discharge status: Home / Self Care | Payer: Medicare Other | Attending: Internal Medicine | Admitting: Internal Medicine

## 2013-05-22 ENCOUNTER — Ambulatory Visit (INDEPENDENT_AMBULATORY_CARE_PROVIDER_SITE_OTHER)
Admission: RE | Admit: 2013-05-22 | Discharge: 2013-05-22 | Disposition: A | Discharge status: Home / Self Care | Payer: Medicare Other | Source: Ambulatory Visit | Attending: Gastroenterology | Admitting: Gastroenterology

## 2013-05-22 DIAGNOSIS — K2100 Gastro-esophageal reflux disease with esophagitis, without bleeding: Secondary | ICD-10-CM

## 2013-05-22 DIAGNOSIS — K5100 Ulcerative (chronic) pancolitis without complications: Principal | ICD-10-CM | POA: Diagnosis present

## 2013-05-22 DIAGNOSIS — F17200 Nicotine dependence, unspecified, uncomplicated: Secondary | ICD-10-CM | POA: Diagnosis present

## 2013-05-22 DIAGNOSIS — R112 Nausea with vomiting, unspecified: Secondary | ICD-10-CM

## 2013-05-22 DIAGNOSIS — K51911 Ulcerative colitis, unspecified with rectal bleeding: Secondary | ICD-10-CM

## 2013-05-22 DIAGNOSIS — F209 Schizophrenia, unspecified: Secondary | ICD-10-CM | POA: Diagnosis present

## 2013-05-22 DIAGNOSIS — IMO0002 Reserved for concepts with insufficient information to code with codable children: Secondary | ICD-10-CM

## 2013-05-22 DIAGNOSIS — F1910 Other psychoactive substance abuse, uncomplicated: Secondary | ICD-10-CM | POA: Diagnosis present

## 2013-05-22 DIAGNOSIS — Z79899 Other long term (current) drug therapy: Secondary | ICD-10-CM

## 2013-05-22 DIAGNOSIS — D500 Iron deficiency anemia secondary to blood loss (chronic): Secondary | ICD-10-CM | POA: Diagnosis present

## 2013-05-22 DIAGNOSIS — K5190 Ulcerative colitis, unspecified, without complications: Secondary | ICD-10-CM | POA: Diagnosis present

## 2013-05-22 DIAGNOSIS — D649 Anemia, unspecified: Secondary | ICD-10-CM

## 2013-05-22 DIAGNOSIS — Z91199 Patient's noncompliance with other medical treatment and regimen due to unspecified reason: Secondary | ICD-10-CM

## 2013-05-22 DIAGNOSIS — K51918 Ulcerative colitis, unspecified with other complication: Secondary | ICD-10-CM

## 2013-05-22 DIAGNOSIS — Z9119 Patient's noncompliance with other medical treatment and regimen: Secondary | ICD-10-CM

## 2013-05-22 DIAGNOSIS — K2960 Other gastritis without bleeding: Secondary | ICD-10-CM

## 2013-05-22 DIAGNOSIS — R195 Other fecal abnormalities: Secondary | ICD-10-CM

## 2013-05-22 LAB — COMPREHENSIVE METABOLIC PANEL
ALT: 14 U/L (ref 0–53)
AST: 11 U/L (ref 0–37)
Alkaline Phosphatase: 60 U/L (ref 39–117)
CO2: 33 mEq/L — ABNORMAL HIGH (ref 19–32)
Chloride: 105 mEq/L (ref 96–112)
Creatinine, Ser: 0.72 mg/dL (ref 0.50–1.35)
GFR calc non Af Amer: >90 mL/min (ref >90)
Potassium: 3.7 mEq/L (ref 3.5–5.1)
Total Bilirubin: 0.1 mg/dL — ABNORMAL LOW (ref 0.3–1.2)

## 2013-05-22 LAB — CBC WITH DIFFERENTIAL/PLATELET
Basophils Absolute: 0 10*3/uL (ref 0.0–0.1)
HCT: 23.7 % — ABNORMAL LOW (ref 39.0–52.0)
Hemoglobin: 7.6 g/dL — ABNORMAL LOW (ref 13.0–17.0)
Lymphocytes Relative: 12 % (ref 12–46)
Monocytes Absolute: 1.5 10*3/uL — ABNORMAL HIGH (ref 0.1–1.0)
Neutro Abs: 8.8 10*3/uL — ABNORMAL HIGH (ref 1.7–7.7)
RDW: 16.7 % — ABNORMAL HIGH (ref 11.5–15.5)
WBC: 11.7 10*3/uL — ABNORMAL HIGH (ref 4.0–10.5)

## 2013-05-22 LAB — URINE MICROSCOPIC-ADD ON

## 2013-05-22 LAB — AMYLASE: Amylase: 55 U/L (ref 0–105)

## 2013-05-22 LAB — CG4 I-STAT (LACTIC ACID): Lactic Acid, Venous: 1.94 mmol/L (ref 0.5–2.2)

## 2013-05-22 LAB — URINALYSIS, ROUTINE W REFLEX MICROSCOPIC
Glucose, UA: NEGATIVE mg/dL
Hgb urine dipstick: NEGATIVE
Ketones, ur: 15 mg/dL — AB
pH: 7 (ref 5.0–8.0)

## 2013-05-22 MED ORDER — METHYLPREDNISOLONE SODIUM SUCC 125 MG IJ SOLR
60.0000 mg | Freq: Every day | INTRAMUSCULAR | Status: DC
  Administered 2013-05-22 – 2013-05-24 (×3): 60 mg via INTRAVENOUS
  Filled 2013-05-22 (×3): qty 0.96

## 2013-05-22 MED ORDER — ONDANSETRON HCL 4 MG PO TABS
8.0000 mg | ORAL_TABLET | Freq: Four times a day (QID) | ORAL | Status: DC | PRN
  Administered 2013-05-24 – 2013-05-25 (×3): 8 mg via ORAL
  Filled 2013-05-22 (×3): qty 2

## 2013-05-22 MED ORDER — SODIUM CHLORIDE 0.9 % IJ SOLN
3.0000 mL | Freq: Two times a day (BID) | INTRAMUSCULAR | Status: DC
  Administered 2013-05-23 – 2013-05-25 (×3): 3 mL via INTRAVENOUS

## 2013-05-22 MED ORDER — SODIUM CHLORIDE 0.9 % IV BOLUS (SEPSIS)
1000.0000 mL | Freq: Once | INTRAVENOUS | Status: AC
  Administered 2013-05-22: 1000 mL via INTRAVENOUS

## 2013-05-22 MED ORDER — SODIUM CHLORIDE 0.9 % IV SOLN
INTRAVENOUS | Status: DC
  Administered 2013-05-22 – 2013-05-23 (×3): via INTRAVENOUS
  Administered 2013-05-24: 1000 mL via INTRAVENOUS
  Administered 2013-05-24: 08:00:00 via INTRAVENOUS

## 2013-05-22 MED ORDER — POTASSIUM CHLORIDE ER 10 MEQ PO TBCR
10.0000 meq | EXTENDED_RELEASE_TABLET | Freq: Two times a day (BID) | ORAL | Status: DC
  Administered 2013-05-22 – 2013-05-25 (×7): 10 meq via ORAL
  Filled 2013-05-22 (×7): qty 1

## 2013-05-22 MED ORDER — MESALAMINE 1.2 G PO TBEC
4.8000 g | DELAYED_RELEASE_TABLET | Freq: Every day | ORAL | Status: DC
  Administered 2013-05-23 – 2013-05-25 (×3): 4.8 g via ORAL
  Filled 2013-05-22 (×4): qty 4

## 2013-05-22 MED ORDER — ONDANSETRON HCL 4 MG/2ML IJ SOLN
4.0000 mg | Freq: Once | INTRAMUSCULAR | Status: AC
  Administered 2013-05-22: 4 mg via INTRAVENOUS
  Filled 2013-05-22: qty 2

## 2013-05-22 MED ORDER — HYDROMORPHONE HCL PF 1 MG/ML IJ SOLN
1.0000 mg | INTRAMUSCULAR | Status: AC | PRN
  Administered 2013-05-22 – 2013-05-23 (×2): 1 mg via INTRAVENOUS
  Filled 2013-05-22 (×2): qty 1

## 2013-05-22 MED ORDER — SIMETHICONE 80 MG PO CHEW
160.0000 mg | CHEWABLE_TABLET | Freq: Four times a day (QID) | ORAL | Status: DC | PRN
  Filled 2013-05-22: qty 2

## 2013-05-22 MED ORDER — HYDROMORPHONE HCL PF 1 MG/ML IJ SOLN
1.0000 mg | Freq: Once | INTRAMUSCULAR | Status: AC
  Administered 2013-05-22: 1 mg via INTRAVENOUS
  Filled 2013-05-22: qty 1

## 2013-05-22 MED ORDER — OLANZAPINE 5 MG PO TABS
5.0000 mg | ORAL_TABLET | Freq: Every day | ORAL | Status: DC
  Administered 2013-05-22 – 2013-05-24 (×3): 5 mg via ORAL
  Filled 2013-05-22 (×4): qty 1

## 2013-05-22 NOTE — ED Provider Notes (Signed)
CSN: [id number]     Arrival date & time 05/22/13  1328 History     First MD Initiated Contact with Patient 05/22/13 1625     Chief Complaint  Patient presents with  . Emesis   (Consider location/radiation/quality/duration/timing/severity/associated sxs/prior Treatment) Patient is a 32 y.o. male presenting with vomiting. The history is provided by the patient.  Emesis Duration:  2 weeks Timing:  Intermittent Number of daily episodes:  3-4 times per day Progression:  Unchanged Chronicity:  Recurrent Associated symptoms: abdominal pain (has had abdominal pain x 1 month since before last admission), chills and diarrhea (with occasional bloody stools, most recent this AM)   Associated symptoms: no fever   Risk factors comment:  IBD   Past Medical History  Diagnosis Date  . Ulcerative colitis   . Schizophrenia   . GI (gastrointestinal bleed) 04/2013  . Crohn's disease    Past Surgical History  Procedure Laterality Date  . Mandible fracture surgery    . Flexible sigmoidoscopy  12/05/2011    Procedure: FLEXIBLE SIGMOIDOSCOPY;  Surgeon: Beryle Beams, MD;  Location: [facility] ENDOSCOPY;  Service: Endoscopy;  Laterality: N/A;   Family History  Problem Relation Age of Onset  . Liver cancer     History  Substance Use Topics  . Smoking status: Current Every Day Smoker -- 0.25 packs/day for 20 years    Types: Cigarettes  . Smokeless tobacco: Never Used  . Alcohol Use: Yes     Comment: occasional    Review of Systems  Constitutional: Positive for chills. Negative for fever.  Gastrointestinal: Positive for nausea, vomiting, abdominal pain (has had abdominal pain x 1 month since before last admission), diarrhea (with occasional bloody stools, most recent this AM) and blood in stool. Negative for constipation.  Genitourinary: Negative for dysuria.  Neurological: Positive for weakness (generalized) and light-headedness.  All other systems reviewed and are negative.    Allergies   Depakote; Lithium; and Nsaids  Home Medications   Current Outpatient Rx  Name  Route  Sig  Dispense  Refill  . mesalamine (LIALDA) 1.2 G EC tablet   Oral   Take 4 tablets (4.8 g total) by mouth daily with breakfast.   120 tablet   3   . OLANZapine (ZYPREXA) 5 MG tablet   Oral   Take 1 tablet (5 mg total) by mouth at bedtime.         . ondansetron (ZOFRAN) 8 MG tablet   Oral   Take 8 mg by mouth every 6 (six) hours as needed for nausea.         . potassium chloride (K-DUR) 10 MEQ tablet   Oral   Take 1 tablet (10 mEq total) by mouth 2 (two) times daily.   60 tablet   0   . predniSONE (DELTASONE) 10 MG tablet   Oral   Take 2 tablets (20 mg total) by mouth 2 (two) times daily.   120 tablet   2   . simethicone (MYLICON) 80 MG chewable tablet   Oral   Chew 2 tablets (160 mg total) by mouth 4 (four) times daily as needed for flatulence (gas).   30 tablet   0   . traMADol (ULTRAM) 50 MG tablet   Oral   Take 50 mg by mouth every 6 (six) hours as needed for pain.          BP 131/67  Pulse 104  Temp(Src) 99.1 F (37.3 C)  Resp 16  SpO2 99%  Physical Exam  Nursing note and vitals reviewed. Constitutional: He is oriented to person, place, and time. He appears well-developed and well-nourished.  HENT:  Head: Normocephalic and atraumatic.  Right Ear: External ear normal.  Left Ear: External ear normal.  Nose: Nose normal.  Eyes: Right eye exhibits no discharge. Left eye exhibits no discharge.  Neck: Neck supple.  Cardiovascular: Normal rate, regular rhythm, normal heart sounds and intact distal pulses.   Pulmonary/Chest: Effort normal.  Abdominal: Soft. There is no tenderness (when distracted, there is not tenderness elicited).  Neurological: He is alert and oriented to person, place, and time.  Skin: Skin is warm and dry.    ED Course   Procedures (including critical care time)  Labs Reviewed  CBC WITH DIFFERENTIAL - Abnormal; Notable for the  following:    WBC 11.7 (*)    RBC 2.73 (*)    Hemoglobin 7.6 (*)    HCT 23.7 (*)    RDW 16.7 (*)    Platelets 615 (*)    Neutro Abs 8.8 (*)    Monocytes Relative 13 (*)    Monocytes Absolute 1.5 (*)    All other components within normal limits  COMPREHENSIVE METABOLIC PANEL - Abnormal; Notable for the following:    CO2 33 (*)    Glucose, Bld 151 (*)    Calcium 8.2 (*)    Total Protein 5.4 (*)    Albumin 2.0 (*)    Total Bilirubin 0.1 (*)    All other components within normal limits  URINALYSIS, ROUTINE W REFLEX MICROSCOPIC - Abnormal; Notable for the following:    Specific Gravity, Urine 1.034 (*)    Bilirubin Urine SMALL (*)    Ketones, ur 15 (*)    Protein, ur 30 (*)    Leukocytes, UA TRACE (*)    All other components within normal limits  URINE MICROSCOPIC-ADD ON - Abnormal; Notable for the following:    Casts HYALINE CASTS (*)    Crystals CA OXALATE CRYSTALS (*)    All other components within normal limits  CG4 I-STAT (LACTIC ACID) - Abnormal; Notable for the following:    Lactic Acid, Venous 2.27 (*)    All other components within normal limits  OCCULT BLOOD, POC DEVICE - Abnormal; Notable for the following:    Fecal Occult Bld POSITIVE (*)    All other components within normal limits  LIPASE, BLOOD  AMYLASE  CBC  BASIC METABOLIC PANEL  URINE RAPID DRUG SCREEN (HOSP PERFORMED)  CG4 I-STAT (LACTIC ACID)  TYPE AND SCREEN   Dg Abd 2 Views  05/22/2013   *RADIOLOGY REPORT*  Clinical Data: Vomiting, evaluate for obstruction  ABDOMEN - 2 VIEW  Comparison: 05/06/2013  Findings: There is nonobstructive bowel gas pattern.  Stool noted in the right colon and transverse colon. Moderate stool noted within the distal sigmoid colon and rectum.  There is a moderate gaseous distention of proximal sigmoid colon.  IMPRESSION: Nonobstructive bowel gas pattern.  Stool noted in the right colon, transverse colon and distal rectosigmoid colon.  Moderate gaseous distention of proximal sigmoid  colon.   Original Report Authenticated By: Lahoma Crocker, M.D.   1. Other, mixed, or unspecified nondependent drug abuse, unspecified   2. Ulcerative colitis, with rectal bleeding     MDM  32 year old male with poorly controlled ulcerative colitis presents with recurrent abdominal pain vomiting and hematochezia. Vital signs are stable, however patient has dropped his hemoglobin 3 g and 7.6 and is symptomatic. Due to this and  his dehydration from vomiting he'll need to be out of the hospital for serial CBCs and rehydration.  Ephraim Hamburger, MD 05/22/13 [phone]

## 2013-05-22 NOTE — Progress Notes (Signed)
Report received from ER nurse.

## 2013-05-22 NOTE — ED Notes (Signed)
States that he has been vomiting since he left the hospital 2 weeks ag o dx w/ ulcerative cololitis

## 2013-05-22 NOTE — ED Notes (Signed)
Pt given food and fluids per MD

## 2013-05-22 NOTE — H&P (Signed)
[facility] History and Physical  [name] HKV:[id number] DOB: [birth year] DOA: 05/22/2013  Referring physician: ED PCP: No PCP Per Patient  Chief Complaint: Abdominal pain  HPI: [name] is a 32 y.o. male well known to our service, long history of polysubstance abuse, schizophrenia, and very poorly controlled UC.  Unfortunately he comes in again with another UC flare, this time with a hemoglobin 7.6 down from 12 when he was on our service 2 weeks ago.  Stool is loose with mucous and some blood.  Patient has received 1L bolus in ED, no transfusion as of yet.  Hospitalist called to admit.  Review of Systems: No fever, symptoms essentially unresolved after last admit, 12 systems reviewed and otherwise negative.  Past Medical History  Diagnosis Date  . Ulcerative colitis   . Schizophrenia   . GI (gastrointestinal bleed) 04/2013  . Crohn's disease    Past Surgical History  Procedure Laterality Date  . Mandible fracture surgery    . Flexible sigmoidoscopy  12/05/2011    Procedure: FLEXIBLE SIGMOIDOSCOPY;  Surgeon: Beryle Beams, MD;  Location: [facility] ENDOSCOPY;  Service: Endoscopy;  Laterality: N/A;   Social History:  reports that he has been smoking Cigarettes.  He has a 5 pack-year smoking history. He has never used smokeless tobacco. He reports that  drinks alcohol. He reports that he uses illicit drugs (Cocaine and Marijuana).   Allergies  Allergen Reactions  . Depakote (Divalproex Sodium) Other (See Comments)    Reaction: seizures   . Lithium Other (See Comments)    Reaction: rectal bleeding  . Nsaids Other (See Comments)    Reaction: rectal bleeding    Family History  Problem Relation Age of Onset  . Liver cancer       Prior to Admission medications   Medication Sig Start Date End Date Taking? Authorizing Provider  mesalamine (LIALDA) 1.2 G EC tablet Take 4 tablets (4.8 g total) by mouth daily with breakfast. 05/21/13  Yes Milus Banister, MD  OLANZapine  (ZYPREXA) 5 MG tablet Take 1 tablet (5 mg total) by mouth at bedtime. 05/08/13  Yes Delfina Redwood, MD  ondansetron (ZOFRAN) 8 MG tablet Take 8 mg by mouth every 6 (six) hours as needed for nausea.   Yes Historical Provider, MD  potassium chloride (K-DUR) 10 MEQ tablet Take 1 tablet (10 mEq total) by mouth 2 (two) times daily. 05/08/13  Yes Delfina Redwood, MD  predniSONE (DELTASONE) 10 MG tablet Take 2 tablets (20 mg total) by mouth 2 (two) times daily. 05/21/13  Yes Milus Banister, MD  simethicone (MYLICON) 80 MG chewable tablet Chew 2 tablets (160 mg total) by mouth 4 (four) times daily as needed for flatulence (gas). 05/02/13  Yes Janece Canterbury, MD  traMADol (ULTRAM) 50 MG tablet Take 50 mg by mouth every 6 (six) hours as needed for pain.   Yes Historical Provider, MD   Physical Exam: Filed Vitals:   05/22/13 2223  BP: 115/67  Pulse: 92  Temp: 98.5 F (36.9 C)  Resp: 20     General:  NAD, resting comfortably in bed  Eyes: PEERLA EOMI  ENT: mucous membranes moist  Neck: supple w/o JVD  Cardiovascular: RRR w/o MRG  Respiratory: CTA B  Abdomen: soft, nt, nd, bs+  Skin: no rash nor lesion  Musculoskeletal: MAE, full ROM all 4 extremities  Psychiatric: normal tone and affect  Neurologic: AAOx3, grossly non-focal   Labs on Admission:  Basic Metabolic Panel:  Recent Labs  Lab 05/21/13 1038 05/22/13 1641  NA 142 143  K 3.5 3.7  CL 105 105  CO2 30 33*  GLUCOSE 193* 151*  BUN 13 17  CREATININE 0.9 0.72  CALCIUM 8.0* 8.2*   Liver Function Tests:  Recent Labs Lab 05/22/13 1641  AST 11  ALT 14  ALKPHOS 60  BILITOT 0.1*  PROT 5.4*  ALBUMIN 2.0*    Recent Labs Lab 05/22/13 1641  LIPASE 51  AMYLASE 55   No results found for this basename: AMMONIA,  in the last 168 hours CBC:  Recent Labs Lab 05/21/13 1038 05/22/13 1641  WBC 7.6 11.7*  NEUTROABS 6.4 8.8*  HGB 7.9 Repeated and verified X2.* 7.6*  HCT 24.8 Repeated and verified X2.* 23.7*   MCV 87.4 86.8  PLT 722.0* 615*   Cardiac Enzymes: No results found for this basename: CKTOTAL, CKMB, CKMBINDEX, TROPONINI,  in the last 168 hours  BNP (last 3 results) No results found for this basename: PROBNP,  in the last 8760 hours CBG: No results found for this basename: GLUCAP,  in the last 168 hours  Radiological Exams on Admission: Dg Abd 2 Views  05/22/2013   *RADIOLOGY REPORT*  Clinical Data: Vomiting, evaluate for obstruction  ABDOMEN - 2 VIEW  Comparison: 05/06/2013  Findings: There is nonobstructive bowel gas pattern.  Stool noted in the right colon and transverse colon. Moderate stool noted within the distal sigmoid colon and rectum.  There is a moderate gaseous distention of proximal sigmoid colon.  IMPRESSION: Nonobstructive bowel gas pattern.  Stool noted in the right colon, transverse colon and distal rectosigmoid colon.  Moderate gaseous distention of proximal sigmoid colon.   Original Report Authenticated By: Lahoma Crocker, M.D.    EKG: Independently reviewed.  Assessment/Plan Principal Problem:   Ulcerative colitis Active Problems:   SUBSTANCE ABUSE, MULTIPLE   1. Ulcerative colitis - GI consult in AM, solumedrol 60 daily and 4.8g melsalamine both ordered (this is what seems to have worked last time)  Patient states he is compliant with all home meds but there has been question of noncompliance frequently in the past.  Question if Colectomy isnt the best course of action and direction to go with this patient, he does not seem totally opposed to the Idea at this time but is understandably reluctant to commit to this. 2. Substance abuse - checking UDS, no cocaine recently.  Code Status: Full Code (must indicate code status--if unknown or must be presumed, indicate so) Family Communication: No family in room (indicate person spoken with, if applicable, with phone number if by telephone) Disposition Plan: Admit to inpatient (indicate anticipated LOS)  Time spent: 70  min  GARDNER, JARED M. [facility] Pager [phone]  If 7PM-7AM, please contact night-coverage [email or web address] Password [facility] 05/22/2013, 10:39 PM

## 2013-05-22 NOTE — Progress Notes (Signed)
Called ER nurse for report. Will call back in 2-3 minutes.

## 2013-05-23 ENCOUNTER — Other Ambulatory Visit: Payer: Self-pay

## 2013-05-23 DIAGNOSIS — R112 Nausea with vomiting, unspecified: Secondary | ICD-10-CM

## 2013-05-23 DIAGNOSIS — R195 Other fecal abnormalities: Secondary | ICD-10-CM

## 2013-05-23 DIAGNOSIS — K5180 Other ulcerative colitis without complications: Secondary | ICD-10-CM

## 2013-05-23 DIAGNOSIS — D649 Anemia, unspecified: Secondary | ICD-10-CM | POA: Diagnosis present

## 2013-05-23 LAB — BASIC METABOLIC PANEL
Calcium: 7.9 mg/dL — ABNORMAL LOW (ref 8.4–10.5)
Creatinine, Ser: 0.74 mg/dL (ref 0.50–1.35)
GFR calc Af Amer: >90 mL/min (ref >90)
GFR calc non Af Amer: >90 mL/min (ref >90)
Sodium: 144 mEq/L (ref 135–145)

## 2013-05-23 LAB — CBC
Hemoglobin: 7.4 g/dL — ABNORMAL LOW (ref 13.0–17.0)
MCH: 28 pg (ref 26.0–34.0)
MCH: 27.1 pg (ref 26.0–34.0)
MCHC: 32.2 g/dL (ref 30.0–36.0)
MCV: 87 fL (ref 78.0–100.0)
Platelets: 539 10*3/uL — ABNORMAL HIGH (ref 150–400)
Platelets: 514 10*3/uL — ABNORMAL HIGH (ref 150–400)
RBC: 2.39 MIL/uL — ABNORMAL LOW (ref 4.22–5.81)
RBC: 2.73 MIL/uL — ABNORMAL LOW (ref 4.22–5.81)
RDW: 16.5 % — ABNORMAL HIGH (ref 11.5–15.5)
WBC: 11.4 10*3/uL — ABNORMAL HIGH (ref 4.0–10.5)

## 2013-05-23 LAB — RAPID URINE DRUG SCREEN, HOSP PERFORMED
Cocaine: POSITIVE — AB
Opiates: POSITIVE — AB
Tetrahydrocannabinol: NOT DETECTED

## 2013-05-23 MED ORDER — HYDROMORPHONE HCL PF 1 MG/ML IJ SOLN
1.0000 mg | Freq: Three times a day (TID) | INTRAMUSCULAR | Status: DC | PRN
  Administered 2013-05-23 – 2013-05-25 (×6): 1 mg via INTRAVENOUS
  Filled 2013-05-23 (×6): qty 1

## 2013-05-23 NOTE — Consult Note (Signed)
[facility] Consult: 10:24 AM 05/23/2013   Referring Provider: Dr [name] Primary Care Physician:  [name]. Phone is [phone]  His pt # is [phone] [id number] Primary Gastroenterologist:  [name] first saw on 04/20/13.  [name]/[name] and [name] on prior occasions.    Reason for Consultation:  For normocytic anemia.   HPI: [name] is a 32 y.o. male.  UC diagnosed in 2006 with pan colitis on colonoscopy.  s/p flex sig with Keith. Benson Brennan in 11/2011 which showed severe left sided UC. He has history of depression, schizophrenia, and polysubstance abuse.  Tox screen + for cocaine, opiates, THC  This is 4th inpt GI consult since 7/6 for this same problem(s):  Bloody diarrhea, abdominal pain due to ulcerative colitis.  He had stopped taking all GI and psych meds for several months PTA of 7/6. Failed/slow to improve with Solumedrol, prednisone. CT scan of 7/9: moderate left-sided colitis in descending and sigmoid colon.  CT repeated 7/13 due to ongoing complaints: mild improvement in colitis but new, large stool burden. Stool pathogen studies/C Diff were negative. Always able to eat without problem, no nausea. Went home day 7 on mesalamine suppositories, 1.6 grams tid of Asacol, 20 mg bid Prednisone, hydrocodone and hyoscyamine. Also restarted on Zyprexa.     He has proven unreliable in taking his meds for ulcerative colitis.  This was apparent on GI office visit of 8/6, one day prior to current admission.  Was only using Prednisone but was not taking Asacol by mouth or per rectum. Affording meds was reason cited for non-compliance. In order to comply with Medicaid formulary, Keith [name] switched to [facility] so that pt could afford meds. Prednisone 20 mg daily continued.  Keith [name] was careful to reinforce instructions and med regimen both verbally and in plain typed instructions.  Additionally pt was made well aware that GI would not provide  narcotics.  Keith [name] feels pt would be best served by immunomodulators but has no intention of initiating these until pt can show dedicated compliance with current meds and follow up  Since 7/24 discharge says he has been vomiting daily.  Sometimes clear, sometimes oatmeal like, sometimes cola like.  Yet he eats well, in fact yesterday he ate 2 Sandwiches!?!?!?  He did not mention anything about vomiting to [name] 2 days ago.  Has anywhere from 1 to 3 stools daily.  Has not been seeing blood although on rectal was FOB +.  Stools are often formed.  [name] and  ED note says was having bloody stool. Weight is 76.5 kg/168#.  It was 74.4/164# on 7/22  He was readmitted for the 4th time on 8/7 with c/o abdominal pain and non bloody vomiting.    Hgb yesterday 7.6, 6.7 today.  2 weeks ago it was 10.2 to 12.9. 4 weeks ago it was 12 to 14.   Got one unit PRBC and repeat CBC is pending.   Tox screen is again + for cocaine and opiates. He says no use of cocaine for many months.  Also says not using hydrocodone etc.      Past Medical History  Diagnosis Date  . Ulcerative colitis   . Schizophrenia   . GI (gastrointestinal bleed) 04/2013  . Crohn's disease     Past Surgical History  Procedure Laterality Date  . Mandible fracture surgery    . Flexible sigmoidoscopy  12/05/2011    Procedure: FLEXIBLE SIGMOIDOSCOPY;  Surgeon: Keith Beams,  MD;  Location: [facility];  Service: Endoscopy;  Laterality: N/A;    Prior to Admission medications   Medication Sig Start Date End Date Taking? Authorizing Provider  mesalamine (LIALDA) 1.2 G EC tablet Take 4 tablets (4.8 g total) by mouth daily with breakfast. 05/21/13  Yes Keith Banister, MD  OLANZapine (ZYPREXA) 5 MG tablet Take 1 tablet (5 mg total) by mouth at bedtime. 05/08/13  Yes Keith Redwood, MD  ondansetron (ZOFRAN) 8 MG tablet Take 8 mg by mouth every 6 (six) hours as needed for nausea.   Yes Historical Provider, MD  potassium chloride  (K-DUR) 10 MEQ tablet Take 1 tablet (10 mEq total) by mouth 2 (two) times daily. 05/08/13  Yes Keith Redwood, MD  predniSONE (DELTASONE) 10 MG tablet Take 2 tablets (20 mg total) by mouth 2 (two) times daily. 05/21/13  Yes Keith Banister, MD  simethicone (MYLICON) 80 MG chewable tablet Chew 2 tablets (160 mg total) by mouth 4 (four) times daily as needed for flatulence (gas). 05/02/13  Yes Keith Canterbury, MD  traMADol (ULTRAM) 50 MG tablet Take 50 mg by mouth every 6 (six) hours as needed for pain.   Yes Historical Provider, MD    Scheduled Meds: . mesalamine  4.8 g Oral Q breakfast  . methylPREDNISolone (SOLU-MEDROL) injection  60 mg Intravenous Daily  . OLANZapine  5 mg Oral QHS  . potassium chloride  10 mEq Oral BID  . sodium chloride  3 mL Intravenous Q12H   Infusions: . sodium chloride 100 mL/hr at 05/22/13 2253   PRN Meds: ondansetron, simethicone   Allergies as of 05/22/2013 - Review Complete 05/22/2013  Allergen Reaction Noted  . Depakote (divalproex sodium) Other (See Comments) 06/17/2012  . Lithium Other (See Comments) 10/21/2012  . Nsaids Other (See Comments) 10/21/2012    Family History  Problem Relation Age of Onset  . Liver cancer      History   Social History  . Marital Status: Single    Spouse Name: N/A    Number of Children: N/A  . Years of Education: GED   Occupational History  . disabled    Social History Main Topics  . Smoking status: Current Every Day Smoker -- 0.25 packs/day for 20 years    Types: Cigarettes  . Smokeless tobacco: Never Used  . Alcohol Use: Yes     Comment: occasional  . Drug Use: Yes    Special: Cocaine, Marijuana     Comment: pt states he rarely uses cocaine  . Sexually Active: Not on file   Other Topics Concern  . Not on file   Social History Narrative   [name] was recently released from prison, currently living at home with his mother.  He is disabled (due to mental illness?).     REVIEW OF SYSTEMS: No  falls, no weakness.  No dyspnea.  No ETOH, no NSAIDs.  No swelling.  No nose bleeds.  No blood in urine. No syncope, no blurry vision.  Living with his mother.    PHYSICAL EXAM: Vital signs in last 24 hours: Temp:  [98.1 F (36.7 C)-99.1 F (37.3 C)] 98.9 F (37.2 C) (08/08 1000) Pulse Rate:  [71-104] 82 (08/08 1000) Resp:  [16-20] 20 (08/08 1000) BP: (109-131)/(66-76) 116/76 mmHg (08/08 1000) SpO2:  [97 %-100 %] 98 % (08/08 0510) Weight:  [76.567 kg (168 lb 12.8 oz)] 76.567 kg (168 lb 12.8 oz) (08/07 2223)  General: NAD.  Asking for food.  comfortable  Head:  No asymetry.  Facial geometry suggests retardation.   Eyes:  No icterus or pallor Ears:  No HOH  Nose:  No discharge Mouth:  Clear, moist oral MM Neck:  No JVD, no mass Lungs:  Clear bil.  No cough or dyspnea.  Heart: RRR.  No MRG Abdomen:  Soft, NT, vague, mild pain in mid abdomen.  No guard or rebound.   Rectal: not repeated   Musc/Skeltl: no joint deformity or swelling Extremities:  No edema  Neurologic:  Oriented x 3.  No gross deficits.  Skin:  No sores or rash.   Tattoos:  None seen Nodes:  No inguinal or cervical adenopathy.    Psych:  Pleasant, relaxed, cooperative.  Fully alert, not somnolent.    Intake/Output from previous day: 08/07 0701 - 08/08 0700 In: 1071.7 [P.O.:360; I.V.:711.7] Out: -  Intake/Output this shift: Total I/O In: 600 [I.V.:250; Blood:350] Out: -   LAB RESULTS:   Ref. Range 05/07/2013 05:30 05/21/2013 10:38 05/22/2013 16:41 05/23/2013 04:55  WBC Latest Range: 4.0-10.5 K/uL 12.1 (H) 7.6 11.7 (H) 11.1 (H)  RBC Latest Range: 4.22-5.81 MIL/uL 3.64 (L) 2.84 (L) 2.73 (L) 2.39 (L)  Hemoglobin Latest Range: 13.0-17.0 g/dL 10.2 (L) 7.9 Repeated and verified X2. (LL) 7.6 (L) 6.7 (LL)  HCT Latest Range: 39.0-52.0 % 30.2 (L) 24.8 Repeated and verified X2. (L) 23.7 (L) 20.8 (L)  MCV Latest Range: 78.0-100.0 fL 83.0 87.4 86.8 87.0  MCH Latest Range: 26.0-34.0 pg 28.0  27.8 28.0  MCHC Latest Range:  30.0-36.0 g/dL 33.8 31.7 32.1 32.2  RDW Latest Range: 11.5-15.5 % 15.4 16.8 (H) 16.7 (H) 16.5 (H)  Platelets Latest Range: 150-400 K/uL 436 (H) 722.0 (H) 615 (H) 539 (H)    BMET Lab Results  Component Value Date   NA 144 05/23/2013   NA 143 05/22/2013   NA 142 05/21/2013   K 3.9 05/23/2013   K 3.7 05/22/2013   K 3.5 05/21/2013   CL 110 05/23/2013   CL 105 05/22/2013   CL 105 05/21/2013   CO2 27 05/23/2013   CO2 33* 05/22/2013   CO2 30 05/21/2013   GLUCOSE 116* 05/23/2013   GLUCOSE 151* 05/22/2013   GLUCOSE 193* 05/21/2013   BUN 14 05/23/2013   BUN 17 05/22/2013   BUN 13 05/21/2013   CREATININE 0.74 05/23/2013   CREATININE 0.72 05/22/2013   CREATININE 0.9 05/21/2013   CALCIUM 7.9* 05/23/2013   CALCIUM 8.2* 05/22/2013   CALCIUM 8.0* 05/21/2013   LFT  Recent Labs  05/22/13 1641  PROT 5.4*  ALBUMIN 2.0*  AST 11  ALT 14  ALKPHOS 60  BILITOT 0.1*   PT/INR Lab Results  Component Value Date   INR 1.32 12/17/2009   INR 1.3 12/20/2007   INR 1.3 06/17/2007    Drugs of Abuse     Component Value Date/Time   LABOPIA POSITIVE* 05/23/2013 0555   COCAINSCRNUR POSITIVE* 05/23/2013 0555   LABBENZ NONE DETECTED 05/23/2013 0555   AMPHETMU NONE DETECTED 05/23/2013 0555   THCU NONE DETECTED 05/23/2013 0555   LABBARB NONE DETECTED 05/23/2013 0555     RADIOLOGY STUDIES: Dg Abd 2 Views 05/22/2013   *RADIOLOGY REPORT*  Clinical Data: Vomiting, evaluate for obstruction  ABDOMEN - 2 VIEW  Comparison: 05/06/2013  Findings: There is nonobstructive bowel gas pattern.  Stool noted in the right colon and transverse colon. Moderate stool noted within the distal sigmoid colon and rectum.  There is a moderate gaseous distention of proximal sigmoid colon.  IMPRESSION:  Nonobstructive bowel gas pattern.  Stool noted in the right colon, transverse colon and distal rectosigmoid colon.  Moderate gaseous distention of proximal sigmoid colon.   Original Report Authenticated By: Lahoma Crocker, M.D.    ENDOSCOPIC STUDIES: 11/2011 Flex sig [name]  Severe colitis  from dentate line to splenic flexure, normal proximal to sf. Hemorrhoids.  Plan: Asacol 4.8 grams daily and prednisone.   2006 Colonoscopy Keith Vladimir Faster  universal ulcerative colitis   IMPRESSION: *  Ulcerative colitis. Last scope was flex sig 11/2011.  2 CT scans last month with left sided disease and constipation.  Varying story as to presence of bloody stool but overall sounds like occurrences of blood and stool form and frequency are much improved. Says he has been compliant with Lialda and ongoing compliance with *  Vomiting without much nausea.  Able to tolerate solids for the most part.  Wonder if there is an esophageal stricture or ulcer that is causing this.  Has never had EGD and takes no PPI at home.  *  Normocytic anemia. Not particularly symptomatic. Got one unit of blood this AM *  Polysubstance abuse, question about drug seeking behavior at times. Toxicology positive for cocaine now and on all but 2 of the last 9 or so screens this year.  He adamantly denies using cocaine for at least 10 months. Can not rule out element of cocaine induced ischemic colitis.  *  Medical non-compliance  *  Schizophrenia.  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  *  Glucose intolerance.  Steroids contributing.   PLAN: *  EGD this afternoon.   *  Add Protonix?  Reluctant to do this unless there is clear mucosal pathology.  *  CBC in AM.  Does not need tid CBC.  No need for telemetry.   Addendum  12:30 PM Food service delivered [facility] tray to pt room and he ate it before RN aware.  I rescheduled the EGD for 9AM tomorrow.    LOS: 1 day   Azucena Freed  05/23/2013, 10:24 AM Pager: [phone]      Attending physician's note   I have taken a history, examined the patient and reviewed the chart. I agree with the Advanced Practitioner's note, impression and recommendations. Worsening anemia, heme + stool, ulcerative colitis, intermittent vomiting, polysubstance abuse, schizophrenia. Very  challenging to manage this patient. EGD tomorrow to evaluate for ulcer, esophagitis, etc. Continue Lialda 4.8g daily and prednisone 20 mg daily for ulcerative colitis.  Ladene Artist, MD Marval Regal

## 2013-05-23 NOTE — Progress Notes (Signed)
05/23/2013 11:24 AM  Pt has had two reported BMs so far this am, both without blood visible. Pt tolerating diet as well.  Princella Pellegrini

## 2013-05-23 NOTE — Progress Notes (Signed)
CRITICAL VALUE ALERT  Critical value received:  Results for CROY, DRUMWRIGHT (MRN [id number]) as of 05/23/2013 06:27  Ref. Range 05/23/2013 04:55  Hemoglobin Latest Range: 13.0-17.0 g/dL 6.7 (LL)    Date of notification:  05/23/13  Time of notification:  0630  Critical value read back:yes  Nurse who received alert:  Viviano Simas   MD notified (1st page):  Tylene Fantasia  Time of first page:  0630  MD notified (2nd page):  Time of second page:  Responding MD:  Tylene Fantasia NP  Time MD responded:  [phone]

## 2013-05-23 NOTE — Progress Notes (Signed)
Utilization review completed.  

## 2013-05-23 NOTE — Progress Notes (Signed)
05/23/2013 12:34 PM  Pt signed consent around 20 minutes ago for EGD.  Walked back into the room and noticed that although the pt was NPO, that dietary had placed a tray in his room and that he had eaten approximately half of it.  Endo and Theodis Aguas, PA-C notified, full liquid diet restarted and EGD on hold currently.  Will monitor. Princella Pellegrini

## 2013-05-23 NOTE — Progress Notes (Signed)
05/23/2013 8:48 AM  Notified by Caro Hight that patient heart rhythm was second degree, type 2; confirmed upon strip review.  Pt asymptomatic, vitals stable.  No c/o chest pain, palpitations, or SOB.    Dr. Endre Coutts Sabal notified. Blood transfusion started shortly after call received.  Pt seems to be tolerating well.Order for EKG received.  Will continue to monitor patient. Princella Pellegrini

## 2013-05-23 NOTE — Progress Notes (Signed)
[facility] PROGRESS NOTE  [name] HFW:[id number] DOB: Jun 02, 1981 DOA: 05/22/2013 PCP: No PCP Per Patient  Assessment/Plan: Principal Problem:   Ulcerative colitis Active Problems:   SUBSTANCE ABUSE, MULTIPLE    Ulcerative colitis - Hx ulcerative colitis. Diagnosed in 2006. Pan colitis on colonoscopy. s/p flex sig with Dr. Benson Norway in 2/13 which showed severe left sided UC. Seen 2 weeks ago as inpt consult for recurrent bloody diarrhea, narcotic requiring abdominal pain. He had stopped taking all GI and psych meds for several months PTA. Failed/slow to improve with Solumedrol, prednisone. CT scan on 7/9 and 7/13 with colitis in descending colon. Stool pathogen studies/C Diff were negative. Always able to eat without problem, no nausea. Went home day 7 on mesalamine suppositories, 1.6 grams tid of Asacol, 20 mg bid Prednisone, hydrocodone and hyoscyamine. Also restarted on Zyprexa. He has not been taking asacol at all since he left the hospital. He stopped suppositories as well. Not taking them at all  1.  solumedrol 60 daily and 4.8g melsalamine both ordered (this is what seems to have worked last time) Patient states he is compliant with all home meds but there has been question of noncompliance frequently in the past.  2. Substance abuse - checking UDS, no cocaine recently. 3. Anemia status post transfusion of one unit of packed red blood cells, hemoglobin has dropped from 14.1-6.7   Code Status: full Family Communication: family updated about patient's clinical progress Disposition Plan:  As above    Brief narrative: [name] is a 32 y.o. male well known to our service, long history of polysubstance abuse, schizophrenia, and very poorly controlled UC. Unfortunately he comes in again with another UC flare, this time with a hemoglobin 7.6 down from 12 when he was on our service 2 weeks ago. Stool is loose with mucous and some blood. Patient has received 1L bolus in ED, no  transfusion as of yet. Hospitalist called to admit.   Consultants: None  Procedures: None  Antibiotics: None HPI/Subjective: Patient is unable to quantify if he has any blood in his stool He states that he did see some blood yesterday  Objective: Filed Vitals:   05/22/13 1931 05/22/13 2130 05/22/13 2223 05/23/13 0510  BP: 125/73 125/66 115/67 109/75  Pulse: 71 86 92 81  Temp: 98.1 F (36.7 C)  98.5 F (36.9 C) 98.7 F (37.1 C)  TempSrc: Oral  Oral Oral  Resp: 18  20 20   Height:   6' 0.5" (1.842 m)   Weight:   76.567 kg (168 lb 12.8 oz)   SpO2: 97% 99% 100% 98%    Intake/Output Summary (Last 24 hours) at 05/23/13 0825 Last data filed at 05/23/13 0600  Gross per 24 hour  Intake 1071.67 ml  Output      0 ml  Net 1071.67 ml    Exam: General: NAD, resting comfortably in bed  Eyes: PEERLA EOMI  ENT: mucous membranes moist  Neck: supple w/o JVD  Cardiovascular: RRR w/o MRG  Respiratory: CTA B  Abdomen: soft, nt, nd, bs+  Skin: no rash nor lesion  Musculoskeletal: MAE, full ROM all 4 extremities  Psychiatric: normal tone and affect  Neurologic: AAOx3, grossly non-focal    Data Reviewed: Basic Metabolic Panel:  Recent Labs Lab 05/21/13 1038 05/22/13 1641 05/23/13 0455  NA 142 143 144  K 3.5 3.7 3.9  CL 105 105 110  CO2 30 33* 27  GLUCOSE 193* 151* 116*  BUN 13 17 14   CREATININE 0.9 0.72  0.74  CALCIUM 8.0* 8.2* 7.9*    Liver Function Tests:  Recent Labs Lab 05/22/13 1641  AST 11  ALT 14  ALKPHOS 60  BILITOT 0.1*  PROT 5.4*  ALBUMIN 2.0*    Recent Labs Lab 05/22/13 1641  LIPASE 51  AMYLASE 55   No results found for this basename: AMMONIA,  in the last 168 hours  CBC:  Recent Labs Lab 05/21/13 1038 05/22/13 1641 05/23/13 0455  WBC 7.6 11.7* 11.1*  NEUTROABS 6.4 8.8*  --   HGB 7.9 Repeated and verified X2.* 7.6* 6.7*  HCT 24.8 Repeated and verified X2.* 23.7* 20.8*  MCV 87.4 86.8 87.0  PLT 722.0* 615* 539*    Cardiac  Enzymes: No results found for this basename: CKTOTAL, CKMB, CKMBINDEX, TROPONINI,  in the last 168 hours BNP (last 3 results) No results found for this basename: PROBNP,  in the last 8760 hours   CBG: No results found for this basename: GLUCAP,  in the last 168 hours  No results found for this or any previous visit (from the past 240 hour(s)).   Studies: Ct Abdomen Pelvis W Contrast  04/27/2013   *RADIOLOGY REPORT*  Clinical Data: Nausea, vomiting and diarrhea.  History of ulcerative colitis.  CT ABDOMEN AND PELVIS WITH CONTRAST  Technique:  Multidetector CT imaging of the abdomen and pelvis was performed following the standard protocol during bolus administration of intravenous contrast.  Contrast: 192m OMNIPAQUE IOHEXOL 300 MG/ML  SOLN  Comparison: CT abdomen and pelvis 04/23/2013.  Findings: The lung bases are clear.  No pleural or pericardial effusion.  As on prior study, there is some thickening of the walls of the descending colon through the rectosigmoid consistent with colitis. Pericolonic inflammatory change about the descending colon appears slightly improved.  A large volume of stool is present in the ascending and transverse colon. No focal fluid collection or free intraperitoneal air is present.  The appendix appears normal.  The stomach and small bowel are unremarkable.  The liver, gallbladder, adrenal glands, spleen, pancreas, kidneys and biliary tree all appear normal.  Scattered small lymph nodes in the root of the mesentery and retroperitoneum are again seen measuring up to 1.1 cm short axis dimension in the left periaortic station on image 50.  No focal bony abnormality is identified.  IMPRESSION:  1.  Changes of colitis involving the descending colon through the rectosigmoid persist but appear mildly improved since the study 4 days ago.  There is no abscess or other new abnormality. 2.  Small retroperitoneal and mesenteric lymph nodes are stable in appearance and likely reactive. 3.   Large stool burden ascending and proximal transverse colon.   Original Report Authenticated By: TOrlean Patten M.D.   Ct Abdomen Pelvis W Contrast  04/23/2013   *RADIOLOGY REPORT*  Clinical Data: Abdominal pain, left side greater than right. Bloody diarrhea.  Nausea vomiting.  Colitis.  CT ABDOMEN AND PELVIS WITH CONTRAST  Technique:  Multidetector CT imaging of the abdomen and pelvis was performed following the standard protocol during bolus administration of intravenous contrast.  Contrast: 106mOMNIPAQUE IOHEXOL 300 MG/ML  SOLN  Comparison: 12/01/2011  Findings: Moderate wall thickening is seen involving the descending and sigmoid portions of the colon which appears contiguous from the level of the splenic flexure to the rectum.  This is increased since previous study and is consistent with colitis.  There is no evidence of pneumatosis, abscess, or free fluid.  No evidence of bowel obstruction.  No evidence of bowel  wall thickening involving the distal ileum or small bowel.  Normal appendix is visualized.  The liver, spleen, pancreas, gallbladder, adrenal glands, and kidneys are normal in appearance.  No evidence of hydronephrosis. Retroaortic left renal vein is incidentally noted. Mild lymphadenopathy is again seen in the in the left para-aortic region, with largest measuring 11 mm in short axis. Mild lymphadenopathy is also seen in the sigmoid mesocolon.  These are stable since prior exam.  No other soft tissue masses are identified.  No suspicious bone lesions identified.  IMPRESSION:  1. Moderate left-sided colitis involving the descending and sigmoid colon, which is increased since previous study. Differential diagnosis includes ulcerative colitis and infectious colitis. 2.  Stable mild lymphadenopathy in the left para-aortic region and sigmoid mesocolon.  This is nonspecific, but may be reactive in etiology.   Original Report Authenticated By: Earle Gell, M.D.   Dg Chest Port 1 View  05/06/2013    *RADIOLOGY REPORT*  Clinical Data: Leukocytosis  PORTABLE CHEST - 1 VIEW  Comparison: None.  Findings: Lungs clear.  Heart size and pulmonary vascularity are normal.  No adenopathy.  No bone lesions.  IMPRESSION: No abnormality noted.   Original Report Authenticated By: Lowella Grip, M.D.   Dg Abd 2 Views  05/22/2013   *RADIOLOGY REPORT*  Clinical Data: Vomiting, evaluate for obstruction  ABDOMEN - 2 VIEW  Comparison: 05/06/2013  Findings: There is nonobstructive bowel gas pattern.  Stool noted in the right colon and transverse colon. Moderate stool noted within the distal sigmoid colon and rectum.  There is a moderate gaseous distention of proximal sigmoid colon.  IMPRESSION: Nonobstructive bowel gas pattern.  Stool noted in the right colon, transverse colon and distal rectosigmoid colon.  Moderate gaseous distention of proximal sigmoid colon.   Original Report Authenticated By: Lahoma Crocker, M.D.   Dg Abd 2 Views  05/06/2013   *RADIOLOGY REPORT*  Clinical Data: Nausea and vomiting.  Ulcerative colitis.  ABDOMEN - 2 VIEW  Comparison: 04/20/2013  Findings: Moderate stool burden in the ascending colon.  No free intraperitoneal gas.  Diffuse gaseous distention of both small and large bowel in an ileus pattern.  Sigmoid colon gas is present. The sigmoid colon wall is irregular and thickened consistent with the findings noted on the recent CT abdomen.  There is no obvious pneumatosis.  Phleboliths project over the pelvis.  IMPRESSION: Diffuse gaseous distention as described.  Colonic wall irregularity and thickening in the sigmoid colon is noted as previously described on the CT.  No free intraperitoneal gas or pneumatosis.   Original Report Authenticated By: Marybelle Killings, M.D.    Scheduled Meds: . mesalamine  4.8 g Oral Q breakfast  . methylPREDNISolone (SOLU-MEDROL) injection  60 mg Intravenous Daily  . OLANZapine  5 mg Oral QHS  . potassium chloride  10 mEq Oral BID  . sodium chloride  3 mL Intravenous  Q12H   Continuous Infusions: . sodium chloride 100 mL/hr at 05/22/13 2253    Principal Problem:   Ulcerative colitis Active Problems:   SUBSTANCE ABUSE, MULTIPLE    Time spent: 40 minutes   [facility] Pager [phone]. If 8PM-8AM, please contact night-coverage at [email or web address], password [facility] 05/23/2013, 8:25 AM  LOS: 1 day

## 2013-05-24 ENCOUNTER — Encounter (HOSPITAL_COMMUNITY): Payer: Self-pay

## 2013-05-24 ENCOUNTER — Encounter (HOSPITAL_COMMUNITY)
Admission: EM | Disposition: A | Discharge status: Home / Self Care | Payer: Self-pay | Source: Home / Self Care | Attending: Internal Medicine

## 2013-05-24 DIAGNOSIS — K2960 Other gastritis without bleeding: Secondary | ICD-10-CM

## 2013-05-24 HISTORY — PX: ESOPHAGOGASTRODUODENOSCOPY: SHX5428

## 2013-05-24 LAB — IRON AND TIBC
Iron: 14 ug/dL — ABNORMAL LOW (ref 42–135)
Saturation Ratios: 7 % — ABNORMAL LOW (ref 20–55)
TIBC: 211 ug/dL — ABNORMAL LOW (ref 215–435)
UIBC: 197 ug/dL (ref 125–400)

## 2013-05-24 LAB — CBC
HCT: 23.5 % — ABNORMAL LOW (ref 39.0–52.0)
Hemoglobin: 7.8 g/dL — ABNORMAL LOW (ref 13.0–17.0)
MCV: 84.8 fL (ref 78.0–100.0)
RBC: 2.77 MIL/uL — ABNORMAL LOW (ref 4.22–5.81)
WBC: 12.8 10*3/uL — ABNORMAL HIGH (ref 4.0–10.5)

## 2013-05-24 LAB — VITAMIN B12: Vitamin B-12: 334 pg/mL (ref 211–911)

## 2013-05-24 LAB — TYPE AND SCREEN: ABO/RH(D): A POS

## 2013-05-24 LAB — FOLATE: Folate: 11.3 ng/mL

## 2013-05-24 SURGERY — EGD (ESOPHAGOGASTRODUODENOSCOPY)
Anesthesia: Moderate Sedation

## 2013-05-24 MED ORDER — MIDAZOLAM HCL 10 MG/2ML IJ SOLN
INTRAMUSCULAR | Status: DC | PRN
  Administered 2013-05-24 (×2): 2 mg via INTRAVENOUS
  Administered 2013-05-24: 1 mg via INTRAVENOUS

## 2013-05-24 MED ORDER — PANTOPRAZOLE SODIUM 40 MG PO TBEC
40.0000 mg | DELAYED_RELEASE_TABLET | Freq: Two times a day (BID) | ORAL | Status: DC
  Administered 2013-05-24 – 2013-05-25 (×3): 40 mg via ORAL
  Filled 2013-05-24: qty 2
  Filled 2013-05-24: qty 1

## 2013-05-24 MED ORDER — BUTAMBEN-TETRACAINE-BENZOCAINE 2-2-14 % EX AERO
INHALATION_SPRAY | CUTANEOUS | Status: DC | PRN
  Administered 2013-05-24: 2 via TOPICAL

## 2013-05-24 MED ORDER — SODIUM CHLORIDE 0.9 % IV SOLN: INTRAVENOUS | Status: DC

## 2013-05-24 MED ORDER — POLYSACCHARIDE IRON COMPLEX 150 MG PO CAPS
150.0000 mg | ORAL_CAPSULE | Freq: Every day | ORAL | Status: DC
  Administered 2013-05-24 – 2013-05-25 (×2): 150 mg via ORAL
  Filled 2013-05-24 (×2): qty 1

## 2013-05-24 MED ORDER — FENTANYL CITRATE 0.05 MG/ML IJ SOLN
INTRAMUSCULAR | Status: DC | PRN
  Administered 2013-05-24 (×2): 25 ug via INTRAVENOUS

## 2013-05-24 MED ORDER — PREDNISONE 20 MG PO TABS
20.0000 mg | ORAL_TABLET | Freq: Every day | ORAL | Status: DC
  Administered 2013-05-25: 20 mg via ORAL
  Filled 2013-05-24 (×3): qty 1

## 2013-05-24 MED ORDER — DIPHENHYDRAMINE HCL 50 MG/ML IJ SOLN
INTRAMUSCULAR | Status: DC | PRN
  Administered 2013-05-24: 25 mg via INTRAVENOUS

## 2013-05-24 NOTE — Progress Notes (Signed)
Patient ID: [name], male   DOB: [birth year], 32 y.o.   MRN: [id number] [name] Progress Note  Subjective: Says he feels better, denies dysphagia or odynophagia. No vomiting. Still c/o abdominal pain,denies rectal bleeding or diarrhea today  Objective:  Vital signs in last 24 hours: Temp:  [98.2 F (36.8 C)-98.9 F (37.2 C)] 98.3 F (36.8 C) (08/09 1036) Pulse Rate:  [67-83] 73 (08/09 1036) Resp:  [14-21] 16 (08/09 1036) BP: (115-168)/(65-95) 119/69 mmHg (08/09 1036) SpO2:  [98 %-100 %] 100 % (08/09 1036) FiO2 (%):  [2 %] 2 % (08/09 0920) Weight:  [176 lb 4.8 oz (79.969 kg)] 176 lb 4.8 oz (79.969 kg) (08/08 2048) Last BM Date: 05/23/13 General:   Alert,  Well-developed, AA male   in NAD Heart:  Regular rate and rhythm; no murmurs Pulm;clear Abdomen:  Soft, nontender and nondistended. Normal bowel sounds, without guarding, and without rebound.   Extremities:  Without edema. Neurologic:  Alert and  oriented x4;  grossly normal neurologically. Psych:  Alert and cooperative.affect flat-speech monotone. Intake/Output from previous day: 08/08 0701 - 08/09 0700 In: 2536.3 [P.O.:780; I.V.:1406.3; Blood:350] Out: -  Intake/Output this shift: Total I/O In: 240 [P.O.:240] Out: -   Lab Results:  Recent Labs  05/23/13 0455 05/23/13 1210 05/24/13 0447  WBC 11.1* 11.4* 12.8*  HGB 6.7* 7.4* 7.8*  HCT 20.8* 23.6* 23.5*  PLT 539* 514* 517*   BMET  Recent Labs  05/22/13 1641 05/23/13 0455  NA 143 144  K 3.7 3.9  CL 105 110  CO2 33* 27  GLUCOSE 151* 116*  BUN 17 14  CREATININE 0.72 0.74  CALCIUM 8.2* 7.9*   LFT  Recent Labs  05/22/13 1641  PROT 5.4*  ALBUMIN 2.0*  AST 11  ALT 14  ALKPHOS 60  BILITOT 0.1*    Assessment / Brennan: #1  32 yo male with erosive esophagitis and gastritis-continue BID Protonix for one month, then qam indefinitely. Advance diet as tolerates. H pylori pending-will treat if + #2 Ulcerative colitis-very difficult to  manage due to noncompliance-he tells me his mother fixes his meds for him every day.  Discontinue solumedrol. Resume prednisone 20 mg orally, and Lialda  4.8 daily. He may need colonoscopy to determine extent of colitis if symptoms do not come under control. Defer decision to Dr. Ardis Brennan as outpatient. #3 anemia- secondary to blood loss with colitis, esophagitis, gastritis.  Check iron studies, and start oral iron again. #4 polysubstance abuse #5 schizophrenia  Principal Problem:   Ulcerative colitis Active Problems:   SUBSTANCE ABUSE, MULTIPLE   Nausea with vomiting   Anemia, unspecified   Nonspecific abnormal finding in stool contents   Reflux esophagitis   Other specified gastritis without mention of hemorrhage    LOS: 2 days   [name]  05/24/2013, 11:13 AM    Attending physician's note   I have taken an interval history, reviewed the chart and examined the patient. I agree with the Advanced Practitioner's note, impression and recommendations. Resume outpatient regimen for UC as outlined above. Resume Fe bid with meals. Long term PPI as above. Should be ready for discharge tomorrow if he tolerates advanced diet and Hb is stable. Will sign off. Outpatient follow up with [name] as planned.  Keith Riffle. Keith Plan, MD [name]

## 2013-05-24 NOTE — Interval H&P Note (Signed)
History and Physical Interval Note:  05/24/2013 9:05 AM  [name]  has presented today for surgery, with the diagnosis of vomiting, anemia  The various methods of treatment have been discussed with the patient and family. After consideration of risks, benefits and other options for treatment, the patient has consented to  Procedure(s): ESOPHAGOGASTRODUODENOSCOPY (EGD) (N/A) as a surgical intervention .  The patient's history has been reviewed, patient examined, no change in status, stable for surgery.  I have reviewed the patient's chart and labs.  Questions were answered to the patient's satisfaction.     Pricilla Riffle. Fuller Plan MD Marval Regal

## 2013-05-24 NOTE — Op Note (Addendum)
[facility] [place], [id number]   ENDOSCOPY PROCEDURE REPORT PATIENT: [name], [name]  MR#: [id number] BIRTHDATE: 1980-12-26 , 32  yrs. old GENDER: Male ENDOSCOPIST: Ladene Artist, MD, [facility] PROCEDURE DATE:  05/24/2013 PROCEDURE:  EGD, diagnostic ASA CLASS:     Class III INDICATIONS:   Vomiting.   Anemia.  Heme + stool MEDICATIONS: These medications were titrated to patient response per physician's verbal order, Fentanyl 50 mcg IV, Versed 5 mg IV, and Diphenhydramine (Benadryl) 25 mg IV TOPICAL ANESTHETIC: Cetacaine Spray DESCRIPTION OF PROCEDURE: After the risks benefits and alternatives of the procedure were thoroughly explained, informed consent was obtained.  The Pentax Gastroscope [id number] endoscope was introduced through the mouth and advanced to the second portion of the duodenum without limitations.  The instrument was slowly withdrawn as the mucosa was fully examined.  ESOPHAGUS: There was LA Class C esophagitis noted.   The esophagus was otherwise normal. STOMACH: Mild erosive gastritis was found in the gastric antrum. The stomach otherwise appeared normal. DUODENUM: The duodenal mucosa showed no abnormalities in the bulb and second portion of the duodenum.  Retroflexed views revealed no abnormalities.  The scope was then withdrawn from the patient and the procedure completed. COMPLICATIONS: There were no complications.  ENDOSCOPIC IMPRESSION: 1.   LA Class C erosive esophagitis 2.   Mild erosive gastritis  RECOMMENDATIONS: 1.  Anti-reflux regimen long term 2.  PPI bid for 1 month then PPI qam long term 3.  H. pylori Ab and treat if positive 4.  Esophagitis, gastritis and ulcerative colitis are all potential causes of heme + stool and anemia  eSigned:  Ladene Artist, MD, [facility] 05/24/2013 9:30 AM Revised: 05/24/2013 9:30 AM  LK:[id number] Ardis Hughs, MD

## 2013-05-24 NOTE — Progress Notes (Signed)
[facility] PROGRESS NOTE  [name] MWN:[id number] DOB: 1981/04/05 DOA: 05/22/2013 PCP: Provider Not In System  Assessment/Plan: Principal Problem:   Ulcerative colitis Active Problems:   SUBSTANCE ABUSE, MULTIPLE   Nausea with vomiting   Anemia, unspecified   Nonspecific abnormal finding in stool contents   Ulcerative colitis Continue Lialda 4.8g daily and IV Solu-Medrol Appreciate GI input EGD this afternoon to further evaluate his anemia Cannot rule out ischemic colitis secondary to cocaine 2 BMs yesterday, nonbloody PPI   Polysubstance abuse Continues to deny ongoing cocaine use   Schizophrenia on Zyprexa  Anemia-acute blood loss anemia? Does not describe any ongoing blood loss, started on ppi, EGD today, status post transfusion 1 unit yesterday Hemoglobin stable LA Class C erosive esophagitis  2. Mild erosive gastritis   Code Status: full Family Communication: family updated about patient's clinical progress Disposition Plan:  As per GI recommendations    Brief narrative: [name] is a 32 y.o. male. UC diagnosed in 2006 with pan colitis on colonoscopy. s/p flex sig with Dr. Benson Brennan in 11/2011 which showed severe left sided UC. He has history of depression, schizophrenia, and polysubstance abuse. Tox screen + for cocaine, opiates, THC  This is 4th inpt GI consult since 7/6 for this same problem(s): Bloody diarrhea, abdominal pain due to ulcerative colitis. He had stopped taking all GI and psych meds for several months PTA of 7/6. Failed/slow to improve with Solumedrol, prednisone. CT scan of 7/9: moderate left-sided colitis in descending and sigmoid colon. CT repeated 7/13 due to ongoing complaints: mild improvement in colitis but new, large stool burden. Stool pathogen studies/C Diff were negative. Always able to eat without problem, no nausea. Went home day 7 on mesalamine suppositories, 1.6 grams tid of Asacol, 20 mg bid Prednisone, hydrocodone and  hyoscyamine. Also restarted on Zyprexa.  He has proven unreliable in taking his meds for ulcerative colitis. This was apparent on GI office visit of 8/6, one day prior to current admission. Was only using Prednisone but was not taking Asacol by mouth or per rectum. Affording meds was reason cited for non-compliance.  In order to comply with Medicaid formulary, Dr [name] switched to [facility] so that pt could afford meds. Prednisone 20 mg daily continued. Dr [name] was careful to reinforce instructions and med regimen both verbally and in plain typed instructions. Additionally pt was made well aware that GI would not provide narcotics.  Dr [name] feels pt would be best served by immunomodulators but has no intention of initiating these until pt can show dedicated compliance with current meds and follow up  Since 7/24 discharge says he has been vomiting daily. Sometimes clear, sometimes oatmeal like, sometimes cola like. Yet he eats well, in fact yesterday he ate 2 Sandwiches!?!?!? He did not mention anything about vomiting to Dr [name] 2 days ago.  Has anywhere from 1 to 3 stools daily. Has not been seeing blood although on rectal was FOB +. Stools are often formed. Dr [name] and ED note says was having bloody stool.  Weight is 76.5 kg/168#. It was 74.4/164# on 7/22  He was readmitted for the 4th time on 8/7 with c/o abdominal pain and non bloody vomiting.  Hgb yesterday 7.6, 6.7 today. 2 weeks ago it was 10.2 to 12.9. 4 weeks ago it was 12 to 14.  Got one unit PRBC and repeat CBC is pending.  Tox screen is again + for cocaine and opiates. He says no use of cocaine for many months. Also  says not using hydrocodone etc.      Consultants:  Gastroenterology  Procedures:  EGD  Antibiotics:  None  HPI/Subjective: S/p egd   Objective: Filed Vitals:   05/23/13 1300 05/23/13 1700 05/23/13 2048 05/24/13 0424  BP: 168/95 125/65 133/67 122/66  Pulse: 72 67 83 68  Temp: 98.9 F (37.2 C) 98.8 F  (37.1 C) 98.7 F (37.1 C) 98.2 F (36.8 C)  TempSrc: Oral Oral Oral Oral  Resp: 18 18 20 20   Height:   6' 0.5" (1.842 m)   Weight:   79.969 kg (176 lb 4.8 oz)   SpO2: 98% 100% 99% 100%    Intake/Output Summary (Last 24 hours) at 05/24/13 0829 Last data filed at 05/23/13 1732  Gross per 24 hour  Intake 2536.33 ml  Output      0 ml  Net 2536.33 ml    Exam:  HENT:  Head: Atraumatic.  Nose: Nose normal.  Mouth/Throat: Oropharynx is clear and moist.  Eyes: Conjunctivae are normal. Pupils are equal, round, and reactive to light. No scleral icterus.  Neck: Neck supple. No tracheal deviation present.  Cardiovascular: Normal rate, regular rhythm, normal heart sounds and intact distal pulses.  Pulmonary/Chest: Effort normal and breath sounds normal. No respiratory distress.  Abdominal: Soft. Normal appearance and bowel sounds are normal. She exhibits no distension. There is no tenderness.  Musculoskeletal: She exhibits no edema and no tenderness.  Neurological: She is alert. No cranial nerve deficit.    Data Reviewed: Basic Metabolic Panel:  Recent Labs Lab 05/21/13 1038 05/22/13 1641 05/23/13 0455  NA 142 143 144  K 3.5 3.7 3.9  CL 105 105 110  CO2 30 33* 27  GLUCOSE 193* 151* 116*  BUN 13 17 14   CREATININE 0.9 0.72 0.74  CALCIUM 8.0* 8.2* 7.9*    Liver Function Tests:  Recent Labs Lab 05/22/13 1641  AST 11  ALT 14  ALKPHOS 60  BILITOT 0.1*  PROT 5.4*  ALBUMIN 2.0*    Recent Labs Lab 05/22/13 1641  LIPASE 51  AMYLASE 55   No results found for this basename: AMMONIA,  in the last 168 hours  CBC:  Recent Labs Lab 05/21/13 1038 05/22/13 1641 05/23/13 0455 05/23/13 1210 05/24/13 0447  WBC 7.6 11.7* 11.1* 11.4* 12.8*  NEUTROABS 6.4 8.8*  --   --   --   HGB 7.9 Repeated and verified X2.* 7.6* 6.7* 7.4* 7.8*  HCT 24.8 Repeated and verified X2.* 23.7* 20.8* 23.6* 23.5*  MCV 87.4 86.8 87.0 86.4 84.8  PLT 722.0* 615* 539* 514* 517*    Cardiac  Enzymes: No results found for this basename: CKTOTAL, CKMB, CKMBINDEX, TROPONINI,  in the last 168 hours BNP (last 3 results) No results found for this basename: PROBNP,  in the last 8760 hours   CBG: No results found for this basename: GLUCAP,  in the last 168 hours  No results found for this or any previous visit (from the past 240 hour(s)).   Studies: Ct Abdomen Pelvis W Contrast  04/27/2013   *RADIOLOGY REPORT*  Clinical Data: Nausea, vomiting and diarrhea.  History of ulcerative colitis.  CT ABDOMEN AND PELVIS WITH CONTRAST  Technique:  Multidetector CT imaging of the abdomen and pelvis was performed following the standard protocol during bolus administration of intravenous contrast.  Contrast: 155m OMNIPAQUE IOHEXOL 300 MG/ML  SOLN  Comparison: CT abdomen and pelvis 04/23/2013.  Findings: The lung bases are clear.  No pleural or pericardial effusion.  As on prior  study, there is some thickening of the walls of the descending colon through the rectosigmoid consistent with colitis. Pericolonic inflammatory change about the descending colon appears slightly improved.  A large volume of stool is present in the ascending and transverse colon. No focal fluid collection or free intraperitoneal air is present.  The appendix appears normal.  The stomach and small bowel are unremarkable.  The liver, gallbladder, adrenal glands, spleen, pancreas, kidneys and biliary tree all appear normal.  Scattered small lymph nodes in the root of the mesentery and retroperitoneum are again seen measuring up to 1.1 cm short axis dimension in the left periaortic station on image 50.  No focal bony abnormality is identified.  IMPRESSION:  1.  Changes of colitis involving the descending colon through the rectosigmoid persist but appear mildly improved since the study 4 days ago.  There is no abscess or other new abnormality. 2.  Small retroperitoneal and mesenteric lymph nodes are stable in appearance and likely reactive. 3.   Large stool burden ascending and proximal transverse colon.   Original Report Authenticated By: Orlean Patten, M.D.   Dg Chest Port 1 View  05/06/2013   *RADIOLOGY REPORT*  Clinical Data: Leukocytosis  PORTABLE CHEST - 1 VIEW  Comparison: None.  Findings: Lungs clear.  Heart size and pulmonary vascularity are normal.  No adenopathy.  No bone lesions.  IMPRESSION: No abnormality noted.   Original Report Authenticated By: Lowella Grip, M.D.   Dg Abd 2 Views  05/22/2013   *RADIOLOGY REPORT*  Clinical Data: Vomiting, evaluate for obstruction  ABDOMEN - 2 VIEW  Comparison: 05/06/2013  Findings: There is nonobstructive bowel gas pattern.  Stool noted in the right colon and transverse colon. Moderate stool noted within the distal sigmoid colon and rectum.  There is a moderate gaseous distention of proximal sigmoid colon.  IMPRESSION: Nonobstructive bowel gas pattern.  Stool noted in the right colon, transverse colon and distal rectosigmoid colon.  Moderate gaseous distention of proximal sigmoid colon.   Original Report Authenticated By: Lahoma Crocker, M.D.   Dg Abd 2 Views  05/06/2013   *RADIOLOGY REPORT*  Clinical Data: Nausea and vomiting.  Ulcerative colitis.  ABDOMEN - 2 VIEW  Comparison: 04/20/2013  Findings: Moderate stool burden in the ascending colon.  No free intraperitoneal gas.  Diffuse gaseous distention of both small and large bowel in an ileus pattern.  Sigmoid colon gas is present. The sigmoid colon wall is irregular and thickened consistent with the findings noted on the recent CT abdomen.  There is no obvious pneumatosis.  Phleboliths project over the pelvis.  IMPRESSION: Diffuse gaseous distention as described.  Colonic wall irregularity and thickening in the sigmoid colon is noted as previously described on the CT.  No free intraperitoneal gas or pneumatosis.   Original Report Authenticated By: Marybelle Killings, M.D.    Scheduled Meds: . mesalamine  4.8 g Oral Q breakfast  .  methylPREDNISolone (SOLU-MEDROL) injection  60 mg Intravenous Daily  . OLANZapine  5 mg Oral QHS  . potassium chloride  10 mEq Oral BID  . sodium chloride  3 mL Intravenous Q12H   Continuous Infusions: . sodium chloride 100 mL/hr at 05/24/13 0813    Principal Problem:   Ulcerative colitis Active Problems:   SUBSTANCE ABUSE, MULTIPLE   Nausea with vomiting   Anemia, unspecified   Nonspecific abnormal finding in stool contents    Time spent: 40 minutes   [facility] Pager [phone]. If 8PM-8AM, please contact night-coverage at  [email or web address], password [facility] 05/24/2013, 8:29 AM  LOS: 2 days

## 2013-05-24 NOTE — H&P (View-Only) (Signed)
[facility] Consult: 10:24 AM 05/23/2013   Referring Provider: Dr Allyson Sabal Primary Care Physician:  [facility]. Phone is [phone]  His pt # is [phone] S Primary Gastroenterologist:  Dr. Ardis Hughs first saw on 04/20/13.  [facility] and [facility] on prior occasions.    Reason for Consultation:  For normocytic anemia.   HPI: [name] is a 32 y.o. male.  UC diagnosed in 2006 with pan colitis on colonoscopy.  s/p flex sig with Dr. Benson Norway in 11/2011 which showed severe left sided UC. He has history of depression, schizophrenia, and polysubstance abuse.  Tox screen + for cocaine, opiates, THC  This is 4th inpt GI consult since 7/6 for this same problem(s):  Bloody diarrhea, abdominal pain due to ulcerative colitis.  He had stopped taking all GI and psych meds for several months PTA of 7/6. Failed/slow to improve with Solumedrol, prednisone. CT scan of 7/9: moderate left-sided colitis in descending and sigmoid colon.  CT repeated 7/13 due to ongoing complaints: mild improvement in colitis but new, large stool burden. Stool pathogen studies/C Diff were negative. Always able to eat without problem, no nausea. Went home day 7 on mesalamine suppositories, 1.6 grams tid of Asacol, 20 mg bid Prednisone, hydrocodone and hyoscyamine. Also restarted on Zyprexa.     He has proven unreliable in taking his meds for ulcerative colitis.  This was apparent on GI office visit of 8/6, one day prior to current admission.  Was only using Prednisone but was not taking Asacol by mouth or per rectum. Affording meds was reason cited for non-compliance. In order to comply with Medicaid formulary, Dr Ardis Hughs switched to [facility] so that pt could afford meds. Prednisone 20 mg daily continued.  Dr Ardis Hughs was careful to reinforce instructions and med regimen both verbally and in plain typed instructions.  Additionally pt was made well aware that GI would not provide  narcotics.  Dr Ardis Hughs feels pt would be best served by immunomodulators but has no intention of initiating these until pt can show dedicated compliance with current meds and follow up  Since 7/24 discharge says he has been vomiting daily.  Sometimes clear, sometimes oatmeal like, sometimes cola like.  Yet he eats well, in fact yesterday he ate 2 Sandwiches!?!?!?  He did not mention anything about vomiting to Dr Eugenia Pancoast 2 days ago.  Has anywhere from 1 to 3 stools daily.  Has not been seeing blood although on rectal was FOB +.  Stools are often formed.  Dr Eugenia Pancoast and  ED note says was having bloody stool. Weight is 76.5 kg/168#.  It was 74.4/164# on 7/22  He was readmitted for the 4th time on 8/7 with c/o abdominal pain and non bloody vomiting.    Hgb yesterday 7.6, 6.7 today.  2 weeks ago it was 10.2 to 12.9. 4 weeks ago it was 12 to 14.   Got one unit PRBC and repeat CBC is pending.   Tox screen is again + for cocaine and opiates. He says no use of cocaine for many months.  Also says not using hydrocodone etc.      Past Medical History  Diagnosis Date  . Ulcerative colitis   . Schizophrenia   . GI (gastrointestinal bleed) 04/2013  . Crohn's disease     Past Surgical History  Procedure Laterality Date  . Mandible fracture surgery    . Flexible sigmoidoscopy  12/05/2011    Procedure: FLEXIBLE SIGMOIDOSCOPY;  Surgeon: Beryle Beams,  MD;  Location: [facility];  Service: Endoscopy;  Laterality: N/A;    Prior to Admission medications   Medication Sig Start Date End Date Taking? Authorizing Provider  mesalamine (LIALDA) 1.2 G EC tablet Take 4 tablets (4.8 g total) by mouth daily with breakfast. 05/21/13  Yes Milus Banister, MD  OLANZapine (ZYPREXA) 5 MG tablet Take 1 tablet (5 mg total) by mouth at bedtime. 05/08/13  Yes Delfina Redwood, MD  ondansetron (ZOFRAN) 8 MG tablet Take 8 mg by mouth every 6 (six) hours as needed for nausea.   Yes Historical Provider, MD  potassium chloride  (K-DUR) 10 MEQ tablet Take 1 tablet (10 mEq total) by mouth 2 (two) times daily. 05/08/13  Yes Delfina Redwood, MD  predniSONE (DELTASONE) 10 MG tablet Take 2 tablets (20 mg total) by mouth 2 (two) times daily. 05/21/13  Yes Milus Banister, MD  simethicone (MYLICON) 80 MG chewable tablet Chew 2 tablets (160 mg total) by mouth 4 (four) times daily as needed for flatulence (gas). 05/02/13  Yes Janece Canterbury, MD  traMADol (ULTRAM) 50 MG tablet Take 50 mg by mouth every 6 (six) hours as needed for pain.   Yes Historical Provider, MD    Scheduled Meds: . mesalamine  4.8 g Oral Q breakfast  . methylPREDNISolone (SOLU-MEDROL) injection  60 mg Intravenous Daily  . OLANZapine  5 mg Oral QHS  . potassium chloride  10 mEq Oral BID  . sodium chloride  3 mL Intravenous Q12H   Infusions: . sodium chloride 100 mL/hr at 05/22/13 2253   PRN Meds: ondansetron, simethicone   Allergies as of 05/22/2013 - Review Complete 05/22/2013  Allergen Reaction Noted  . Depakote (divalproex sodium) Other (See Comments) 06/17/2012  . Lithium Other (See Comments) 10/21/2012  . Nsaids Other (See Comments) 10/21/2012    Family History  Problem Relation Age of Onset  . Liver cancer      History   Social History  . Marital Status: Single    Spouse Name: N/A    Number of Children: N/A  . Years of Education: GED   Occupational History  . disabled    Social History Main Topics  . Smoking status: Current Every Day Smoker -- 0.25 packs/day for 20 years    Types: Cigarettes  . Smokeless tobacco: Never Used  . Alcohol Use: Yes     Comment: occasional  . Drug Use: Yes    Special: Cocaine, Marijuana     Comment: pt states he rarely uses cocaine  . Sexually Active: Not on file   Other Topics Concern  . Not on file   Social History Narrative   [name] was recently released from prison, currently living at home with his mother.  He is disabled (due to mental illness?).     REVIEW OF SYSTEMS: No  falls, no weakness.  No dyspnea.  No ETOH, no NSAIDs.  No swelling.  No nose bleeds.  No blood in urine. No syncope, no blurry vision.  Living with his mother.    PHYSICAL EXAM: Vital signs in last 24 hours: Temp:  [98.1 F (36.7 C)-99.1 F (37.3 C)] 98.9 F (37.2 C) (08/08 1000) Pulse Rate:  [71-104] 82 (08/08 1000) Resp:  [16-20] 20 (08/08 1000) BP: (109-131)/(66-76) 116/76 mmHg (08/08 1000) SpO2:  [97 %-100 %] 98 % (08/08 0510) Weight:  [76.567 kg (168 lb 12.8 oz)] 76.567 kg (168 lb 12.8 oz) (08/07 2223)  General: NAD.  Asking for food.  comfortable  Head:  No asymetry.  Facial geometry suggests retardation.   Eyes:  No icterus or pallor Ears:  No HOH  Nose:  No discharge Mouth:  Clear, moist oral MM Neck:  No JVD, no mass Lungs:  Clear bil.  No cough or dyspnea.  Heart: RRR.  No MRG Abdomen:  Soft, NT, vague, mild pain in mid abdomen.  No guard or rebound.   Rectal: not repeated   Musc/Skeltl: no joint deformity or swelling Extremities:  No edema  Neurologic:  Oriented x 3.  No gross deficits.  Skin:  No sores or rash.   Tattoos:  None seen Nodes:  No inguinal or cervical adenopathy.    Psych:  Pleasant, relaxed, cooperative.  Fully alert, not somnolent.    Intake/Output from previous day: 08/07 0701 - 08/08 0700 In: 1071.7 [P.O.:360; I.V.:711.7] Out: -  Intake/Output this shift: Total I/O In: 600 [I.V.:250; Blood:350] Out: -   LAB RESULTS:   Ref. Range 05/07/2013 05:30 05/21/2013 10:38 05/22/2013 16:41 05/23/2013 04:55  WBC Latest Range: 4.0-10.5 K/uL 12.1 (H) 7.6 11.7 (H) 11.1 (H)  RBC Latest Range: 4.22-5.81 MIL/uL 3.64 (L) 2.84 (L) 2.73 (L) 2.39 (L)  Hemoglobin Latest Range: 13.0-17.0 g/dL 10.2 (L) 7.9 Repeated and verified X2. (LL) 7.6 (L) 6.7 (LL)  HCT Latest Range: 39.0-52.0 % 30.2 (L) 24.8 Repeated and verified X2. (L) 23.7 (L) 20.8 (L)  MCV Latest Range: 78.0-100.0 fL 83.0 87.4 86.8 87.0  MCH Latest Range: 26.0-34.0 pg 28.0  27.8 28.0  MCHC Latest Range:  30.0-36.0 g/dL 33.8 31.7 32.1 32.2  RDW Latest Range: 11.5-15.5 % 15.4 16.8 (H) 16.7 (H) 16.5 (H)  Platelets Latest Range: 150-400 K/uL 436 (H) 722.0 (H) 615 (H) 539 (H)    BMET Lab Results  Component Value Date   NA 144 05/23/2013   NA 143 05/22/2013   NA 142 05/21/2013   K 3.9 05/23/2013   K 3.7 05/22/2013   K 3.5 05/21/2013   CL 110 05/23/2013   CL 105 05/22/2013   CL 105 05/21/2013   CO2 27 05/23/2013   CO2 33* 05/22/2013   CO2 30 05/21/2013   GLUCOSE 116* 05/23/2013   GLUCOSE 151* 05/22/2013   GLUCOSE 193* 05/21/2013   BUN 14 05/23/2013   BUN 17 05/22/2013   BUN 13 05/21/2013   CREATININE 0.74 05/23/2013   CREATININE 0.72 05/22/2013   CREATININE 0.9 05/21/2013   CALCIUM 7.9* 05/23/2013   CALCIUM 8.2* 05/22/2013   CALCIUM 8.0* 05/21/2013   LFT  Recent Labs  05/22/13 1641  PROT 5.4*  ALBUMIN 2.0*  AST 11  ALT 14  ALKPHOS 60  BILITOT 0.1*   PT/INR Lab Results  Component Value Date   INR 1.32 12/17/2009   INR 1.3 12/20/2007   INR 1.3 06/17/2007    Drugs of Abuse     Component Value Date/Time   LABOPIA POSITIVE* 05/23/2013 0555   COCAINSCRNUR POSITIVE* 05/23/2013 0555   LABBENZ NONE DETECTED 05/23/2013 0555   AMPHETMU NONE DETECTED 05/23/2013 0555   THCU NONE DETECTED 05/23/2013 0555   LABBARB NONE DETECTED 05/23/2013 0555     RADIOLOGY STUDIES: Dg Abd 2 Views 05/22/2013   *RADIOLOGY REPORT*  Clinical Data: Vomiting, evaluate for obstruction  ABDOMEN - 2 VIEW  Comparison: 05/06/2013  Findings: There is nonobstructive bowel gas pattern.  Stool noted in the right colon and transverse colon. Moderate stool noted within the distal sigmoid colon and rectum.  There is a moderate gaseous distention of proximal sigmoid colon.  IMPRESSION:  Nonobstructive bowel gas pattern.  Stool noted in the right colon, transverse colon and distal rectosigmoid colon.  Moderate gaseous distention of proximal sigmoid colon.   Original Report Authenticated By: Lahoma Crocker, M.D.    ENDOSCOPIC STUDIES: 11/2011 Flex sig Dr Benson Norway  Severe colitis  from dentate line to splenic flexure, normal proximal to sf. Hemorrhoids.  Plan: Asacol 4.8 grams daily and prednisone.   2006 Colonoscopy Dr Vladimir Faster  universal ulcerative colitis   IMPRESSION: *  Ulcerative colitis. Last scope was flex sig 11/2011.  2 CT scans last month with left sided disease and constipation.  Varying story as to presence of bloody stool but overall sounds like occurrences of blood and stool form and frequency are much improved. Says he has been compliant with Lialda and ongoing compliance with *  Vomiting without much nausea.  Able to tolerate solids for the most part.  Wonder if there is an esophageal stricture or ulcer that is causing this.  Has never had EGD and takes no PPI at home.  *  Normocytic anemia. Not particularly symptomatic. Got one unit of blood this AM *  Polysubstance abuse, question about drug seeking behavior at times. Toxicology positive for cocaine now and on all but 2 of the last 9 or so screens this year.  He adamantly denies using cocaine for at least 10 months. Can not rule out element of cocaine induced ischemic colitis.  *  Medical non-compliance  *  Schizophrenia.  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  *  Glucose intolerance.  Steroids contributing.   PLAN: *  EGD this afternoon.   *  Add Protonix?  Reluctant to do this unless there is clear mucosal pathology.  *  CBC in AM.  Does not need tid CBC.  No need for telemetry.   Addendum  12:30 PM Food service delivered [facility] tray to pt room and he ate it before RN aware.  I rescheduled the EGD for 9AM tomorrow.    LOS: 1 day   Azucena Freed  05/23/2013, 10:24 AM Pager: [phone]      Attending physician's note   I have taken a history, examined the patient and reviewed the chart. I agree with the Advanced Practitioner's note, impression and recommendations. Worsening anemia, heme + stool, ulcerative colitis, intermittent vomiting, polysubstance abuse, schizophrenia. Very  challenging to manage this patient. EGD tomorrow to evaluate for ulcer, esophagitis, etc. Continue Lialda 4.8g daily and prednisone 20 mg daily for ulcerative colitis.  Ladene Artist, MD [facility]

## 2013-05-25 LAB — BASIC METABOLIC PANEL
BUN: 9 mg/dL (ref 6–23)
CO2: 30 mEq/L (ref 19–32)
Chloride: 107 mEq/L (ref 96–112)
Creatinine, Ser: 0.74 mg/dL (ref 0.50–1.35)
GFR calc Af Amer: >90 mL/min (ref >90)
Potassium: 3.7 mEq/L (ref 3.5–5.1)

## 2013-05-25 MED ORDER — MESALAMINE 1.2 G PO TBEC: 4.8000 g | DELAYED_RELEASE_TABLET | Freq: Every day | ORAL | Status: DC

## 2013-05-25 MED ORDER — PANTOPRAZOLE SODIUM 40 MG PO TBEC: 40.0000 mg | DELAYED_RELEASE_TABLET | Freq: Two times a day (BID) | ORAL | Status: DC

## 2013-05-25 MED ORDER — FERROUS GLUCONATE 216 MG PO TABS: 216.0000 mg | ORAL_TABLET | Freq: Three times a day (TID) | ORAL | Status: DC

## 2013-05-25 NOTE — Discharge Summary (Signed)
Physician Discharge Summary  [name] MRN: [id number] DOB/AGE: 1981/03/05 32 y.o.  PCP: Provider Not In System   Admit date: 05/22/2013 Discharge date: 05/25/2013  Discharge Diagnoses:      Ulcerative colitis Active Problems:   SUBSTANCE ABUSE, MULTIPLE   Nausea with vomiting   Anemia, unspecified   Nonspecific abnormal finding in stool contents   Reflux esophagitis   Other specified gastritis without mention of hemorrhage     Medication List         mesalamine 1.2 G EC tablet  Commonly known as:  LIALDA  Take 4 tablets (4.8 g total) by mouth daily with breakfast.     OLANZapine 5 MG tablet  Commonly known as:  ZYPREXA  Take 1 tablet (5 mg total) by mouth at bedtime.     ondansetron 8 MG tablet  Commonly known as:  ZOFRAN  Take 8 mg by mouth every 6 (six) hours as needed for nausea.     pantoprazole 40 MG tablet  Commonly known as:  PROTONIX  Take 1 tablet (40 mg total) by mouth 2 (two) times daily.     potassium chloride 10 MEQ tablet  Commonly known as:  K-DUR  Take 1 tablet (10 mEq total) by mouth 2 (two) times daily.     predniSONE 10 MG tablet  Commonly known as:  DELTASONE  Take 2 tablets (20 mg total) by mouth 2 (two) times daily.     simethicone 80 MG chewable tablet  Commonly known as:  MYLICON  Chew 2 tablets (160 mg total) by mouth 4 (four) times daily as needed for flatulence (gas).     traMADol 50 MG tablet  Commonly known as:  ULTRAM  Take 50 mg by mouth every 6 (six) hours as needed for pain.        Discharge Condition  Disposition: 01-Home or Self Care   Consults:  Gastroenterology   Significant Diagnostic Studies: Ct Abdomen Pelvis W Contrast  04/27/2013   *RADIOLOGY REPORT*  Clinical Data: Nausea, vomiting and diarrhea.  History of ulcerative colitis.  CT ABDOMEN AND PELVIS WITH CONTRAST  Technique:  Multidetector CT imaging of the abdomen and pelvis was performed following the standard protocol during bolus  administration of intravenous contrast.  Contrast: 159m OMNIPAQUE IOHEXOL 300 MG/ML  SOLN  Comparison: CT abdomen and pelvis 04/23/2013.  Findings: The lung bases are clear.  No pleural or pericardial effusion.  As on prior study, there is some thickening of the walls of the descending colon through the rectosigmoid consistent with colitis. Pericolonic inflammatory change about the descending colon appears slightly improved.  A large volume of stool is present in the ascending and transverse colon. No focal fluid collection or free intraperitoneal air is present.  The appendix appears normal.  The stomach and small bowel are unremarkable.  The liver, gallbladder, adrenal glands, spleen, pancreas, kidneys and biliary tree all appear normal.  Scattered small lymph nodes in the root of the mesentery and retroperitoneum are again seen measuring up to 1.1 cm short axis dimension in the left periaortic station on image 50.  No focal bony abnormality is identified.  IMPRESSION:  1.  Changes of colitis involving the descending colon through the rectosigmoid persist but appear mildly improved since the study 4 days ago.  There is no abscess or other new abnormality. 2.  Small retroperitoneal and mesenteric lymph nodes are stable in appearance and likely reactive. 3.  Large stool burden ascending and proximal transverse colon.  Original Report Authenticated By: Orlean Patten, M.D.   Dg Chest Port 1 View  05/06/2013   *RADIOLOGY REPORT*  Clinical Data: Leukocytosis  PORTABLE CHEST - 1 VIEW  Comparison: None.  Findings: Lungs clear.  Heart size and pulmonary vascularity are normal.  No adenopathy.  No bone lesions.  IMPRESSION: No abnormality noted.   Original Report Authenticated By: Lowella Grip, M.D.   Dg Abd 2 Views  05/22/2013   *RADIOLOGY REPORT*  Clinical Data: Vomiting, evaluate for obstruction  ABDOMEN - 2 VIEW  Comparison: 05/06/2013  Findings: There is nonobstructive bowel gas pattern.  Stool noted in  the right colon and transverse colon. Moderate stool noted within the distal sigmoid colon and rectum.  There is a moderate gaseous distention of proximal sigmoid colon.  IMPRESSION: Nonobstructive bowel gas pattern.  Stool noted in the right colon, transverse colon and distal rectosigmoid colon.  Moderate gaseous distention of proximal sigmoid colon.   Original Report Authenticated By: Lahoma Crocker, M.D.   Dg Abd 2 Views  05/06/2013   *RADIOLOGY REPORT*  Clinical Data: Nausea and vomiting.  Ulcerative colitis.  ABDOMEN - 2 VIEW  Comparison: 04/20/2013  Findings: Moderate stool burden in the ascending colon.  No free intraperitoneal gas.  Diffuse gaseous distention of both small and large bowel in an ileus pattern.  Sigmoid colon gas is present. The sigmoid colon wall is irregular and thickened consistent with the findings noted on the recent CT abdomen.  There is no obvious pneumatosis.  Phleboliths project over the pelvis.  IMPRESSION: Diffuse gaseous distention as described.  Colonic wall irregularity and thickening in the sigmoid colon is noted as previously described on the CT.  No free intraperitoneal gas or pneumatosis.   Original Report Authenticated By: Marybelle Killings, M.D.       Microbiology: No results found for this or any previous visit (from the past 240 hour(s)).   Labs: Results for orders placed during the hospital encounter of 05/22/13 (from the past 48 hour(s))  CBC     Status: Abnormal   Collection Time    05/23/13 12:10 PM      Result Value Range   WBC 11.4 (*) 4.0 - 10.5 K/uL   RBC 2.73 (*) 4.22 - 5.81 MIL/uL   Hemoglobin 7.4 (*) 13.0 - 17.0 g/dL   HCT 23.6 (*) 39.0 - 52.0 %   MCV 86.4  78.0 - 100.0 fL   MCH 27.1  26.0 - 34.0 pg   MCHC 31.4  30.0 - 36.0 g/dL   RDW 15.9 (*) 11.5 - 15.5 %   Platelets 514 (*) 150 - 400 K/uL  CBC     Status: Abnormal   Collection Time    05/24/13  4:47 AM      Result Value Range   WBC 12.8 (*) 4.0 - 10.5 K/uL   RBC 2.77 (*) 4.22 - 5.81  MIL/uL   Hemoglobin 7.8 (*) 13.0 - 17.0 g/dL   HCT 23.5 (*) 39.0 - 52.0 %   MCV 84.8  78.0 - 100.0 fL   MCH 28.2  26.0 - 34.0 pg   MCHC 33.2  30.0 - 36.0 g/dL   RDW 15.6 (*) 11.5 - 15.5 %   Platelets 517 (*) 150 - 400 K/uL  VITAMIN B12     Status: None   Collection Time    05/24/13 12:51 PM      Result Value Range   Vitamin B-12 334  211 - 911 pg/mL   Comment: Performed at  [facility]  FOLATE     Status: None   Collection Time    05/24/13 12:51 PM      Result Value Range   Folate 11.3     Comment: (NOTE)     Reference Ranges            Deficient:       0.4 - 3.3 ng/mL            Indeterminate:   3.4 - 5.4 ng/mL            Normal:              > 5.4 ng/mL     Performed at [facility]  IRON AND TIBC     Status: Abnormal   Collection Time    05/24/13 12:51 PM      Result Value Range   Iron 14 (*) 42 - 135 ug/dL   TIBC 211 (*) 215 - 435 ug/dL   Saturation Ratios 7 (*) 20 - 55 %   UIBC 197  125 - 400 ug/dL   Comment: Performed at [place]     Status: Abnormal   Collection Time    05/24/13 12:51 PM      Result Value Range   Ferritin 11 (*) 22 - 322 ng/mL   Comment: Performed at [facility]     Status: Abnormal   Collection Time    05/24/13 12:51 PM      Result Value Range   Retic Ct Pct 6.7 (*) 0.4 - 3.1 %   RBC. 3.04 (*) 4.22 - 5.81 MIL/uL   Retic Count, Manual 203.7 (*) 19.0 - 186.0 K/uL  BASIC METABOLIC PANEL     Status: Abnormal   Collection Time    05/25/13  5:00 AM      Result Value Range   Sodium 141  135 - 145 mEq/L   Potassium 3.7  3.5 - 5.1 mEq/L   Chloride 107  96 - 112 mEq/L   CO2 30  19 - 32 mEq/L   Glucose, Bld 76  70 - 99 mg/dL   BUN 9  6 - 23 mg/dL   Creatinine, Ser 0.74  0.50 - 1.35 mg/dL   Calcium 7.7 (*) 8.4 - 10.5 mg/dL   GFR calc non Af Amer >90  >90 mL/min   GFR calc Af Amer >90  >90 mL/min   Comment:            The eGFR has been calculated     using the CKD EPI equation.      This calculation has not been     validated in all clinical     situations.     eGFR's persistently     <90 mL/min signify     possible Chronic Kidney Disease.     HPI : Brief narrative:  [name] is a 32 y.o. male. UC diagnosed in 2006 with pan colitis on colonoscopy. s/p flex sig with Dr. Benson Norway in 11/2011 which showed severe left sided UC. He has history of depression, schizophrenia, and polysubstance abuse. Tox screen + for cocaine, opiates, THC  This is 4th inpt GI consult since 7/6 for this same problem(s): Bloody diarrhea, abdominal pain due to ulcerative colitis. He had stopped taking all GI and psych meds for several months PTA of 7/6. Failed/slow to improve with Solumedrol, prednisone. CT scan of 7/9: moderate left-sided colitis in descending and sigmoid colon.  CT repeated 7/13 due to ongoing complaints: mild improvement in colitis but new, large stool burden. Stool pathogen studies/C Diff were negative. Always able to eat without problem, no nausea. Went home day 7 on mesalamine suppositories, 1.6 grams tid of Asacol, 20 mg bid Prednisone, hydrocodone and hyoscyamine. Also restarted on Zyprexa.  He has proven unreliable in taking his meds for ulcerative colitis. This was apparent on GI office visit of 8/6, one day prior to current admission. Was only using Prednisone but was not taking Asacol by mouth or per rectum. Affording meds was reason cited for non-compliance.  In order to comply with Medicaid formulary, Dr Ardis Hughs switched to [facility] so that pt could afford meds. Prednisone 20 mg daily continued. Dr Ardis Hughs was careful to reinforce instructions and med regimen both verbally and in plain typed instructions. Additionally pt was made well aware that GI would not provide narcotics.  Dr Ardis Hughs feels pt would be best served by immunomodulators but has no intention of initiating these until pt can show dedicated compliance with current meds and follow up  Since 7/24 discharge says he has  been vomiting daily. Sometimes clear, sometimes oatmeal like, sometimes cola like. Yet he eats well, in fact yesterday he ate 2 Sandwiches!?!?!? He did not mention anything about vomiting to Dr Eugenia Pancoast 2 days ago.  Has anywhere from 1 to 3 stools daily. Has not been seeing blood although on rectal was FOB +. Stools are often formed. Dr Eugenia Pancoast and ED note says was having bloody stool.  Weight is 76.5 kg/168#. It was 74.4/164# on 7/22  He was readmitted for the 4th time on 8/7 with c/o abdominal pain and non bloody vomiting.  Hgb yesterday 7.6, 6.7 today. 2 weeks ago it was 10.2 to 12.9. 4 weeks ago it was 12 to 14.  Got one unit PRBC and repeat CBC is pending.  Tox screen is again + for cocaine and opiates. He says no use of cocaine for many months. Also says not using hydrocodone etc.    HOSPITAL COURSE:   Ulcerative colitis  Continue Lialda 4.8g daily and prednisone 20 mg a day Appreciate GI input  Status post EGD to evaluate his anemia it shows erosive esophagitis Cannot rule out ischemic colitis secondary to cocaine  2 BMs yesterday, nonbloody  Started on Protonix twice a day   Polysubstance abuse  Continues to deny ongoing cocaine use    Schizophrenia on Zyprexa    Anemia-acute blood loss anemia? Does not describe any ongoing blood loss, started on ppi, status post EGD Status post transfusion of one unit of packed red blood cells CBC weekly EGD showed: LA Class C erosive esophagitis , Mild erosive gastritis      Discharge Exam: Blood pressure 112/69, pulse 66, temperature 97.2 F (36.2 C), temperature source Oral, resp. rate 18, height 6' 0.05" (1.83 m), weight 77.792 kg (171 lb 8 oz), SpO2 99.00%.  General: Alert, Well-developed, AA male in NAD  Heart: Regular rate and rhythm; no murmurs  Pulm;clear  Abdomen: Soft, nontender and nondistended. Normal bowel sounds, without guarding, and without rebound.  Extremities: Without edema.  Neurologic: Alert and oriented x4;  grossly normal neurologically.  Psych: Alert and cooperative.affect flat-speech monotone.        SignedReyne Dumas 05/25/2013, 9:02 AM

## 2013-05-26 ENCOUNTER — Encounter (HOSPITAL_COMMUNITY): Payer: Self-pay | Admitting: Gastroenterology

## 2013-05-30 ENCOUNTER — Emergency Department (HOSPITAL_COMMUNITY)
Admission: EM | Admit: 2013-05-30 | Discharge: 2013-05-30 | Disposition: A | Discharge status: Home / Self Care | Payer: Medicare Other | Attending: Emergency Medicine | Admitting: Emergency Medicine

## 2013-05-30 ENCOUNTER — Emergency Department (HOSPITAL_COMMUNITY): Payer: Medicare Other

## 2013-05-30 ENCOUNTER — Encounter (HOSPITAL_COMMUNITY): Payer: Self-pay | Admitting: Emergency Medicine

## 2013-05-30 DIAGNOSIS — R197 Diarrhea, unspecified: Secondary | ICD-10-CM | POA: Insufficient documentation

## 2013-05-30 DIAGNOSIS — F17200 Nicotine dependence, unspecified, uncomplicated: Secondary | ICD-10-CM | POA: Insufficient documentation

## 2013-05-30 DIAGNOSIS — K921 Melena: Secondary | ICD-10-CM | POA: Insufficient documentation

## 2013-05-30 DIAGNOSIS — Z79899 Other long term (current) drug therapy: Secondary | ICD-10-CM | POA: Insufficient documentation

## 2013-05-30 DIAGNOSIS — Z8719 Personal history of other diseases of the digestive system: Secondary | ICD-10-CM | POA: Insufficient documentation

## 2013-05-30 DIAGNOSIS — R109 Unspecified abdominal pain: Secondary | ICD-10-CM

## 2013-05-30 DIAGNOSIS — K2100 Gastro-esophageal reflux disease with esophagitis, without bleeding: Secondary | ICD-10-CM | POA: Insufficient documentation

## 2013-05-30 DIAGNOSIS — R112 Nausea with vomiting, unspecified: Secondary | ICD-10-CM | POA: Insufficient documentation

## 2013-05-30 DIAGNOSIS — IMO0002 Reserved for concepts with insufficient information to code with codable children: Secondary | ICD-10-CM | POA: Insufficient documentation

## 2013-05-30 DIAGNOSIS — F209 Schizophrenia, unspecified: Secondary | ICD-10-CM | POA: Insufficient documentation

## 2013-05-30 DIAGNOSIS — R42 Dizziness and giddiness: Secondary | ICD-10-CM | POA: Insufficient documentation

## 2013-05-30 DIAGNOSIS — K5190 Ulcerative colitis, unspecified, without complications: Secondary | ICD-10-CM | POA: Insufficient documentation

## 2013-05-30 LAB — URINALYSIS W MICROSCOPIC + REFLEX CULTURE
Bilirubin Urine: NEGATIVE
Glucose, UA: NEGATIVE mg/dL
Hgb urine dipstick: NEGATIVE
Specific Gravity, Urine: 1.013 (ref 1.005–1.030)
Urobilinogen, UA: 0.2 mg/dL (ref 0.0–1.0)

## 2013-05-30 LAB — COMPREHENSIVE METABOLIC PANEL
ALT: 15 U/L (ref 0–53)
Alkaline Phosphatase: 58 U/L (ref 39–117)
CO2: 29 mEq/L (ref 19–32)
GFR calc Af Amer: >90 mL/min (ref >90)
GFR calc non Af Amer: >90 mL/min (ref >90)
Glucose, Bld: 95 mg/dL (ref 70–99)
Potassium: 4 mEq/L (ref 3.5–5.1)
Sodium: 138 mEq/L (ref 135–145)
Total Protein: 5.2 g/dL — ABNORMAL LOW (ref 6.0–8.3)

## 2013-05-30 LAB — RAPID URINE DRUG SCREEN, HOSP PERFORMED
Amphetamines: NOT DETECTED
Benzodiazepines: NOT DETECTED
Opiates: NOT DETECTED
Tetrahydrocannabinol: NOT DETECTED

## 2013-05-30 LAB — CBC WITH DIFFERENTIAL/PLATELET
Lymphocytes Relative: 18 % (ref 12–46)
Lymphs Abs: 0.9 10*3/uL (ref 0.7–4.0)
Neutrophils Relative %: 64 % (ref 43–77)
Platelets: 547 10*3/uL — ABNORMAL HIGH (ref 150–400)
RBC: 3.12 MIL/uL — ABNORMAL LOW (ref 4.22–5.81)
WBC: 5.1 10*3/uL (ref 4.0–10.5)

## 2013-05-30 MED ORDER — HYDROCOD POLST-CHLORPHEN POLST 10-8 MG/5ML PO LQCR
5.0000 mL | Freq: Once | ORAL | Status: DC
  Filled 2013-05-30: qty 5

## 2013-05-30 MED ORDER — METHYLPREDNISOLONE SODIUM SUCC 125 MG IJ SOLR
125.0000 mg | Freq: Once | INTRAMUSCULAR | Status: AC
  Administered 2013-05-30: 125 mg via INTRAVENOUS
  Filled 2013-05-30: qty 2

## 2013-05-30 MED ORDER — SODIUM CHLORIDE 0.9 % IV BOLUS (SEPSIS)
1000.0000 mL | Freq: Once | INTRAVENOUS | Status: AC
  Administered 2013-05-30: 1000 mL via INTRAVENOUS

## 2013-05-30 MED ORDER — HYDROMORPHONE HCL PF 1 MG/ML IJ SOLN
1.0000 mg | Freq: Once | INTRAMUSCULAR | Status: AC
  Administered 2013-05-30: 1 mg via INTRAVENOUS
  Filled 2013-05-30: qty 1

## 2013-05-30 MED ORDER — ONDANSETRON HCL 4 MG/2ML IJ SOLN
4.0000 mg | Freq: Once | INTRAMUSCULAR | Status: AC
  Administered 2013-05-30: 4 mg via INTRAVENOUS
  Filled 2013-05-30: qty 2

## 2013-05-30 NOTE — ED Notes (Signed)
C/o nausea, vomiting, and abdominal pain x 5 days. Denies diarrhea.

## 2013-05-30 NOTE — ED Provider Notes (Signed)
Medical screening examination/treatment/procedure(s) were performed by non-physician practitioner and as supervising physician I was immediately available for consultation/collaboration.  Virgel Manifold, MD 05/30/13 [phone]

## 2013-05-30 NOTE — ED Provider Notes (Signed)
CSN: [id number]     Arrival date & time 05/30/13  1047 History     None    No chief complaint on file.  (Consider location/radiation/quality/duration/timing/severity/associated sxs/prior Treatment) HPI It is a 32 year old male who presents to emergency department with complaint of nausea vomiting and diarrhea. Patient states his symptoms began 2 days after being discharged from the hospital which was 5 days ago. Patient states he has ulcerative colitis and was diagnosed with esophagitis. Patient states he has been compliant with his medications that he was discharged home. He is unsure of what all medications he is taking patient reports daily nausea or vomiting denies hematemesis. She reports diarrhea daily as well. Patient states he is able to eat only [place]. Patient reports his pain is diffuse does not radiate. Pain described as sharp and cramping. Patient denies any fever or chills. Patient denies any urinary symptoms. Past Medical History  Diagnosis Date  . Ulcerative colitis   . Schizophrenia   . GI (gastrointestinal bleed) 04/2013  . Crohn's disease    Past Surgical History  Procedure Laterality Date  . Mandible fracture surgery    . Flexible sigmoidoscopy  12/05/2011    Procedure: FLEXIBLE SIGMOIDOSCOPY;  Surgeon: Beryle Beams, MD;  Location: [facility] ENDOSCOPY;  Service: Endoscopy;  Laterality: N/A;  . Esophagogastroduodenoscopy N/A 05/24/2013    Procedure: ESOPHAGOGASTRODUODENOSCOPY (EGD);  Surgeon: Ladene Artist, MD;  Location: [facility] ENDOSCOPY;  Service: Endoscopy;  Laterality: N/A;   Family History  Problem Relation Age of Onset  . Liver cancer     History  Substance Use Topics  . Smoking status: Current Every Day Smoker -- 0.25 packs/day for 20 years    Types: Cigarettes  . Smokeless tobacco: Never Used  . Alcohol Use: Yes     Comment: occasional    Review of Systems  Constitutional: Negative for fever and chills.  HENT: Negative for neck pain and neck stiffness.    Respiratory: Negative for cough, chest tightness and shortness of breath.   Cardiovascular: Negative for chest pain, palpitations and leg swelling.  Gastrointestinal: Positive for nausea, vomiting, abdominal pain, diarrhea and blood in stool. Negative for abdominal distention.  Genitourinary: Negative for dysuria, urgency, frequency and hematuria.  Musculoskeletal: Negative for myalgias and arthralgias.  Skin: Negative for rash.  Allergic/Immunologic: Negative for immunocompromised state.  Neurological: Positive for dizziness and light-headedness. Negative for weakness, numbness and headaches.    Allergies  Depakote; Lithium; and Nsaids  Home Medications   Current Outpatient Rx  Name  Route  Sig  Dispense  Refill  . ferrous gluconate (FERGON) 216 MG tablet   Oral   Take 1 tablet (216 mg total) by mouth 3 (three) times daily with meals.   120 tablet   2   . mesalamine (LIALDA) 1.2 G EC tablet   Oral   Take 4 tablets (4.8 g total) by mouth daily with breakfast.   120 tablet   3   . OLANZapine (ZYPREXA) 5 MG tablet   Oral   Take 1 tablet (5 mg total) by mouth at bedtime.         . ondansetron (ZOFRAN) 8 MG tablet   Oral   Take 8 mg by mouth every 6 (six) hours as needed for nausea.         . pantoprazole (PROTONIX) 40 MG tablet   Oral   Take 1 tablet (40 mg total) by mouth 2 (two) times daily.   120 tablet   2   .  potassium chloride (K-DUR) 10 MEQ tablet   Oral   Take 1 tablet (10 mEq total) by mouth 2 (two) times daily.   60 tablet   0   . predniSONE (DELTASONE) 10 MG tablet   Oral   Take 2 tablets (20 mg total) by mouth 2 (two) times daily.   120 tablet   2   . simethicone (MYLICON) 80 MG chewable tablet   Oral   Chew 2 tablets (160 mg total) by mouth 4 (four) times daily as needed for flatulence (gas).   30 tablet   0   . traMADol (ULTRAM) 50 MG tablet   Oral   Take 50 mg by mouth every 6 (six) hours as needed for pain.          There were no  vitals taken for this visit. Physical Exam  Nursing note and vitals reviewed. Constitutional: He appears well-developed and well-nourished. No distress.  HENT:  Head: Normocephalic and atraumatic.  Eyes: Conjunctivae are normal.  Neck: Neck supple.  Cardiovascular: Normal rate, regular rhythm and normal heart sounds.   Pulmonary/Chest: Effort normal. No respiratory distress. He has no wheezes. He has no rales.  Abdominal: Soft. Bowel sounds are normal. He exhibits no distension. There is tenderness. There is no rebound.  Diffuse mild tenderness  Musculoskeletal: He exhibits no edema.  Neurological: He is alert.  Skin: Skin is warm and dry.  Psychiatric:  Flat affect    ED Course   Procedures (including critical care time)   Labs Reviewed  CBC WITH DIFFERENTIAL - Abnormal; Notable for the following:    RBC 3.12 (*)    Hemoglobin 8.4 (*)    HCT 26.2 (*)    Platelets 547 (*)    Monocytes Relative 18 (*)    All other components within normal limits  COMPREHENSIVE METABOLIC PANEL - Abnormal; Notable for the following:    Total Protein 5.2 (*)    Albumin 2.3 (*)    Total Bilirubin 0.2 (*)    All other components within normal limits  LIPASE, BLOOD  URINALYSIS W MICROSCOPIC + REFLEX CULTURE  URINE RAPID DRUG SCREEN (HOSP PERFORMED)   Dg Abd Acute W/chest  05/30/2013   *RADIOLOGY REPORT*  Clinical Data: Abdominal pain.  ACUTE ABDOMEN SERIES (ABDOMEN 2 VIEW & CHEST 1 VIEW)  Comparison: Radiographs 05/22/2013 and CT scan 04/27/2013.  Findings: The upright chest x-ray is normal.  Two views of the abdomen demonstrate scattered air and stool in the colon.  There are scattered air filled small bowel loops without significant distention.  A few scattered air fluid levels are noted.  The soft tissue shadows are maintained.  No worrisome calcifications.  The bony structures are intact.  IMPRESSION: Nonspecific bowel gas pattern.  Possible mild ileus or gastroenteritis.  No definite findings  for obstruction or perforation.   Original Report Authenticated By: Marijo Sanes, M.D.   1. Abdominal pain   2. Ulcerative colitis   3. Reflux esophagitis     MDM  In patients with ulcerative colitis presented today with increased nausea vomiting and abdominal pain. Patient just discharged from inpatient where he was treated for ulcerative colitis exacerbation 5 days ago. Patient is tolerating POs at home. Patient reports symptoms are intermittent. Patient reports he has been compliant with all his medications since discharge. Patient denies any fever or changes in his bowels. In ED his abdomen is only mildly tender. The patient is distracted he does not appear to be tender on examination at  all. His lab work is unremarkable. White count is normal. At the time of his presentation and examination is not consistent with worsening colitis. I do not think CT abdomen is indicated at this time. Patient will need to continue his treatments at home and to be compliant with them. She will need a close followup with Dr. Ardis Hughs who is his gastroenterologist. The emergency department his symptoms were treated with the allotted IV for the pain, Zofran for nausea, he was given 125 mg of Solu-Medrol. Patient also was rehydrated with IV saline. Patient felt better and is tolerating by mouth fluids and food in the emergency department.  Filed Vitals:   05/30/13 1114 05/30/13 1158 05/30/13 1353  BP: 116/66  121/70  Pulse: 91  80  Temp: 98.8 F (37.1 C)  98.4 F (36.9 C)  TempSrc: Oral  Oral  Resp:   18  Height:  6' 1"  (1.854 m)   Weight:  174 lb 4.8 oz (79.062 kg)   SpO2: 99%  100%     Renold Genta, PA-C 05/30/13 1543

## 2013-06-01 ENCOUNTER — Emergency Department (HOSPITAL_COMMUNITY)
Admission: EM | Admit: 2013-06-01 | Discharge: 2013-06-01 | Disposition: A | Discharge status: Home / Self Care | Payer: Medicare Other | Attending: Emergency Medicine | Admitting: Emergency Medicine

## 2013-06-01 ENCOUNTER — Encounter (HOSPITAL_COMMUNITY): Payer: Self-pay | Admitting: Emergency Medicine

## 2013-06-01 DIAGNOSIS — R112 Nausea with vomiting, unspecified: Secondary | ICD-10-CM | POA: Insufficient documentation

## 2013-06-01 DIAGNOSIS — D649 Anemia, unspecified: Secondary | ICD-10-CM | POA: Insufficient documentation

## 2013-06-01 DIAGNOSIS — F17200 Nicotine dependence, unspecified, uncomplicated: Secondary | ICD-10-CM | POA: Insufficient documentation

## 2013-06-01 DIAGNOSIS — K5090 Crohn's disease, unspecified, without complications: Secondary | ICD-10-CM | POA: Insufficient documentation

## 2013-06-01 DIAGNOSIS — R109 Unspecified abdominal pain: Secondary | ICD-10-CM

## 2013-06-01 DIAGNOSIS — K5190 Ulcerative colitis, unspecified, without complications: Secondary | ICD-10-CM | POA: Insufficient documentation

## 2013-06-01 DIAGNOSIS — Z79899 Other long term (current) drug therapy: Secondary | ICD-10-CM | POA: Insufficient documentation

## 2013-06-01 DIAGNOSIS — F209 Schizophrenia, unspecified: Secondary | ICD-10-CM | POA: Insufficient documentation

## 2013-06-01 DIAGNOSIS — Z8719 Personal history of other diseases of the digestive system: Secondary | ICD-10-CM | POA: Insufficient documentation

## 2013-06-01 LAB — CBC WITH DIFFERENTIAL/PLATELET
Basophils Absolute: 0 K/uL (ref 0.0–0.1)
Basophils Relative: 0 % (ref 0–1)
Eosinophils Absolute: 0 K/uL (ref 0.0–0.7)
Eosinophils Relative: 0 % (ref 0–5)
HCT: 27.6 % — ABNORMAL LOW (ref 39.0–52.0)
Hemoglobin: 8.6 g/dL — ABNORMAL LOW (ref 13.0–17.0)
Lymphocytes Relative: 13 % (ref 12–46)
Lymphs Abs: 0.7 K/uL (ref 0.7–4.0)
MCH: 26.2 pg (ref 26.0–34.0)
MCHC: 31.2 g/dL (ref 30.0–36.0)
MCV: 84.1 fL (ref 78.0–100.0)
Monocytes Absolute: 1 K/uL (ref 0.1–1.0)
Monocytes Relative: 19 % — ABNORMAL HIGH (ref 3–12)
Neutro Abs: 3.6 K/uL (ref 1.7–7.7)
Neutrophils Relative %: 68 % (ref 43–77)
Platelets: 543 K/uL — ABNORMAL HIGH (ref 150–400)
RBC: 3.28 MIL/uL — ABNORMAL LOW (ref 4.22–5.81)
RDW: 15.1 % (ref 11.5–15.5)
WBC: 5.2 K/uL (ref 4.0–10.5)

## 2013-06-01 LAB — POCT I-STAT, CHEM 8
BUN: 9 mg/dL (ref 6–23)
Calcium, Ion: 1.14 mmol/L (ref 1.12–1.23)
Chloride: 102 meq/L (ref 96–112)
Creatinine, Ser: 0.9 mg/dL (ref 0.50–1.35)
Glucose, Bld: 133 mg/dL — ABNORMAL HIGH (ref 70–99)
HCT: 31 % — ABNORMAL LOW (ref 39.0–52.0)
Hemoglobin: 10.5 g/dL — ABNORMAL LOW (ref 13.0–17.0)
Potassium: 4.2 meq/L (ref 3.5–5.1)
Sodium: 141 meq/L (ref 135–145)
TCO2: 30 mmol/L (ref 0–100)

## 2013-06-01 LAB — URINALYSIS, ROUTINE W REFLEX MICROSCOPIC
Nitrite: NEGATIVE
Protein, ur: NEGATIVE mg/dL
Specific Gravity, Urine: 1.02 (ref 1.005–1.030)
Urobilinogen, UA: 1 mg/dL (ref 0.0–1.0)

## 2013-06-01 LAB — OCCULT BLOOD X 1 CARD TO LAB, STOOL: Fecal Occult Bld: NEGATIVE

## 2013-06-01 MED ORDER — METHYLPREDNISOLONE SODIUM SUCC 125 MG IJ SOLR
125.0000 mg | Freq: Once | INTRAMUSCULAR | Status: AC
  Administered 2013-06-01: 125 mg via INTRAVENOUS
  Filled 2013-06-01: qty 2

## 2013-06-01 MED ORDER — SODIUM CHLORIDE 0.9 % IV BOLUS (SEPSIS)
1000.0000 mL | Freq: Once | INTRAVENOUS | Status: AC
  Administered 2013-06-01: 1000 mL via INTRAVENOUS

## 2013-06-01 MED ORDER — HYDROMORPHONE HCL PF 1 MG/ML IJ SOLN
1.0000 mg | Freq: Once | INTRAMUSCULAR | Status: AC
  Administered 2013-06-01: 1 mg via INTRAVENOUS
  Filled 2013-06-01: qty 1

## 2013-06-01 MED ORDER — ONDANSETRON HCL 4 MG/2ML IJ SOLN
4.0000 mg | INTRAMUSCULAR | Status: AC
  Administered 2013-06-01: 4 mg via INTRAVENOUS
  Filled 2013-06-01: qty 2

## 2013-06-01 MED ORDER — FENTANYL CITRATE 0.05 MG/ML IJ SOLN
50.0000 ug | Freq: Once | INTRAMUSCULAR | Status: AC
  Administered 2013-06-01: 50 ug via INTRAVENOUS
  Filled 2013-06-01: qty 2

## 2013-06-01 NOTE — ED Notes (Signed)
Pt c/o generalized abdominal pain with N/V ongoing x 1 month. Pt has history of ulcerative colitis. Pt unable to tolerate PO food and fluids.

## 2013-06-01 NOTE — ED Provider Notes (Signed)
CSN: [id number]     Arrival date & time 06/01/13  1140 History     First MD Initiated Contact with Patient 06/01/13 1305     Chief Complaint  Patient presents with  . Abdominal Pain  . Nausea  . Emesis   (Consider location/radiation/quality/duration/timing/severity/associated sxs/prior Treatment) HPI Comments: Patient is a 32 year old male with a history of ulcerative colitis who presents for abdominal pain, nausea, and emesis. Patient states that symptoms have been persisting over the last month. Patient seen 2 days ago for same complaints; he was also discharged from the hospital on 05/25/13 after a three-day long hospitalization for abdominal pain with symptomatic anemia. Patient states he is noncompliant with his current medications for his ulcerative colitis; gastroenterologist - Dr. Andree Elk. Patient admits to last episode of emesis last night which was not looking and nonbilious. He describes a cramping, sharp, stabbing, aching abdominal pain is generalized without any modifying factors. Patient states he was nauseous at this time. His last bowel movement was this morning which was normal in color and consistency and without blood. Patient has been tolerating PO food and fluids intermittently. Denies associated fever, dysphagia, chest pain, shortness of breath, hemoptysis, melena, hematochezia, urinary symptoms, numbness tingling, and extremity weakness. Patient states he is followup with his gastroenterologist tomorrow.  Patient is a 32 y.o. male presenting with abdominal pain and vomiting. The history is provided by the patient. No language interpreter was used.  Abdominal Pain Associated symptoms: nausea and vomiting   Associated symptoms: no chest pain, no diarrhea, no dysuria, no fever, no hematuria and no shortness of breath   Emesis Associated symptoms: abdominal pain   Associated symptoms: no diarrhea     Past Medical History  Diagnosis Date  . Ulcerative colitis   .  Schizophrenia   . GI (gastrointestinal bleed) 04/2013  . Crohn's disease    Past Surgical History  Procedure Laterality Date  . Mandible fracture surgery    . Flexible sigmoidoscopy  12/05/2011    Procedure: FLEXIBLE SIGMOIDOSCOPY;  Surgeon: Beryle Beams, MD;  Location: [facility] ENDOSCOPY;  Service: Endoscopy;  Laterality: N/A;  . Esophagogastroduodenoscopy N/A 05/24/2013    Procedure: ESOPHAGOGASTRODUODENOSCOPY (EGD);  Surgeon: Ladene Artist, MD;  Location: [facility] ENDOSCOPY;  Service: Endoscopy;  Laterality: N/A;   Family History  Problem Relation Age of Onset  . Liver cancer     History  Substance Use Topics  . Smoking status: Current Every Day Smoker -- 0.25 packs/day for 20 years    Types: Cigarettes  . Smokeless tobacco: Never Used  . Alcohol Use: Yes     Comment: occasional    Review of Systems  Constitutional: Negative for fever.  Respiratory: Negative for shortness of breath.   Cardiovascular: Negative for chest pain.  Gastrointestinal: Positive for nausea, vomiting and abdominal pain. Negative for diarrhea, blood in stool and rectal pain.  Genitourinary: Negative for dysuria and hematuria.  Neurological: Negative for weakness and numbness.  All other systems reviewed and are negative.   Allergies  Depakote; Lithium; and Nsaids  Home Medications   Current Outpatient Rx  Name  Route  Sig  Dispense  Refill  . ferrous gluconate (FERGON) 216 MG tablet   Oral   Take 216 mg by mouth daily.         . mesalamine (LIALDA) 1.2 G EC tablet   Oral   Take 4.8 g by mouth daily with breakfast.         . OLANZapine (ZYPREXA) 5 MG  tablet   Oral   Take 1 tablet (5 mg total) by mouth at bedtime.         . ondansetron (ZOFRAN) 8 MG tablet   Oral   Take 8 mg by mouth every 6 (six) hours as needed for nausea.         . pantoprazole (PROTONIX) 40 MG tablet   Oral   Take 40 mg by mouth 2 (two) times daily.         . potassium chloride (K-DUR) 10 MEQ tablet   Oral    Take 1 tablet (10 mEq total) by mouth 2 (two) times daily.   60 tablet   0   . predniSONE (DELTASONE) 10 MG tablet   Oral   Take 2 tablets (20 mg total) by mouth 2 (two) times daily.   120 tablet   2    BP 118/62  Pulse 87  Temp(Src) 98.3 F (36.8 C) (Oral)  Resp 16  Ht 6' 1"  (1.854 m)  Wt 174 lb (78.926 kg)  BMI 22.96 kg/m2  SpO2 100%  Physical Exam  Nursing note and vitals reviewed. Constitutional: He is oriented to person, place, and time. He appears well-developed and well-nourished. No distress.  HENT:  Head: Normocephalic and atraumatic.  Mouth/Throat: Oropharynx is clear and moist. No oropharyngeal exudate.  Eyes: Conjunctivae and EOM are normal. Pupils are equal, round, and reactive to light. No scleral icterus.  Neck: Normal range of motion. Neck supple.  Cardiovascular: Normal rate, regular rhythm, normal heart sounds and intact distal pulses.   Pulmonary/Chest: Effort normal and breath sounds normal. No respiratory distress. He has no wheezes. He has no rales.  Abdominal: Soft. Bowel sounds are normal. He exhibits no distension and no mass. There is tenderness (generalized and mild with deep palpation). There is no rebound and no guarding.  No TTP elicited when applying pressure with stethoscope. No peritoneal signs or evidence of acute surgical abdomen.  Genitourinary: Rectal exam shows no external hemorrhoid, no internal hemorrhoid, no fissure, no mass, no tenderness and anal tone normal.  Chaperoned rectal exam with yellow/brown stool. No melena or evidence of gross blood.  Musculoskeletal: Normal range of motion.  Neurological: He is alert and oriented to person, place, and time.  Skin: Skin is warm and dry. No rash noted. He is not diaphoretic. No erythema. No pallor.  Psychiatric: He has a normal mood and affect. His behavior is normal.   ED Course   Procedures (including critical care time)  Labs Reviewed  CBC WITH DIFFERENTIAL - Abnormal; Notable for  the following:    RBC 3.28 (*)    Hemoglobin 8.6 (*)    HCT 27.6 (*)    Platelets 543 (*)    Monocytes Relative 19 (*)    All other components within normal limits  POCT I-STAT, CHEM 8 - Abnormal; Notable for the following:    Glucose, Bld 133 (*)    Hemoglobin 10.5 (*)    HCT 31.0 (*)    All other components within normal limits  URINALYSIS, ROUTINE W REFLEX MICROSCOPIC  OCCULT BLOOD X 1 CARD TO LAB, STOOL   No results found.  1. Abdominal pain in male patient   2. Anemia    MDM  Patient is a 32 year old healthy history of ulcerative colitis who presents for abdominal pain times one month with associated nausea and emesis. Patient admits to a bowel movement this AM which was normal in color and consistency and without evidence of gross  blood. Patient recently hospitalized for acute Crohn's flare with symptomatic anemia. Patient received transfusion as inpatient as well as endoscopy with findings of erosive esophagitis; he admits to full compliance with home meds and denies dysphagia, melena, and hematochezia. Physical exam findings as above; abdomen with improved TTP on reexamination. There are no peritoneal signs or evidence of acute surgical abdomen; no abdominal distention. Do not believe further work up with imaging warranted at this time. Labs significant for Hgb of 8.6, c/w Hgb of 8.4 two days ago. Hemoccult negative. Patient given Fentanyl, Zofran and IV Solumedrol for abdominal pain with symptomatic improvement. Tolerating PO food and fluids without emesis; requesting sandwich. Patient endorses scheduled f/u appt with Dr. Ardis Hughs tomorrow. Given reassuring work up, improved abdominal exam, and the fact that patient tolerating POs, believe patient appropriate for d/c with outpatient follow up. Indications for ED return discussed and patient agreeable to plan with no unaddressed concerns.    Antonietta Breach, PA-C 06/01/13 [phone]

## 2013-06-02 ENCOUNTER — Emergency Department (HOSPITAL_COMMUNITY)
Admission: EM | Admit: 2013-06-02 | Discharge: 2013-06-02 | Disposition: A | Discharge status: Home / Self Care | Payer: Medicare Other | Attending: Emergency Medicine | Admitting: Emergency Medicine

## 2013-06-02 ENCOUNTER — Encounter (HOSPITAL_COMMUNITY): Payer: Self-pay | Admitting: Emergency Medicine

## 2013-06-02 DIAGNOSIS — R112 Nausea with vomiting, unspecified: Secondary | ICD-10-CM

## 2013-06-02 DIAGNOSIS — F209 Schizophrenia, unspecified: Secondary | ICD-10-CM | POA: Insufficient documentation

## 2013-06-02 DIAGNOSIS — R109 Unspecified abdominal pain: Secondary | ICD-10-CM

## 2013-06-02 DIAGNOSIS — K5090 Crohn's disease, unspecified, without complications: Secondary | ICD-10-CM | POA: Insufficient documentation

## 2013-06-02 DIAGNOSIS — Z8719 Personal history of other diseases of the digestive system: Secondary | ICD-10-CM | POA: Insufficient documentation

## 2013-06-02 DIAGNOSIS — Z79899 Other long term (current) drug therapy: Secondary | ICD-10-CM | POA: Insufficient documentation

## 2013-06-02 DIAGNOSIS — F17200 Nicotine dependence, unspecified, uncomplicated: Secondary | ICD-10-CM | POA: Insufficient documentation

## 2013-06-02 LAB — COMPREHENSIVE METABOLIC PANEL
ALT: 12 U/L (ref 0–53)
Alkaline Phosphatase: 55 U/L (ref 39–117)
BUN: 12 mg/dL (ref 6–23)
CO2: 31 mEq/L (ref 19–32)
Chloride: 102 mEq/L (ref 96–112)
GFR calc Af Amer: >90 mL/min (ref >90)
GFR calc non Af Amer: >90 mL/min (ref >90)
Glucose, Bld: 91 mg/dL (ref 70–99)
Potassium: 3.6 mEq/L (ref 3.5–5.1)
Sodium: 140 mEq/L (ref 135–145)
Total Bilirubin: 0.1 mg/dL — ABNORMAL LOW (ref 0.3–1.2)
Total Protein: 5.8 g/dL — ABNORMAL LOW (ref 6.0–8.3)

## 2013-06-02 LAB — CBC WITH DIFFERENTIAL/PLATELET
Basophils Absolute: 0 10*3/uL (ref 0.0–0.1)
Basophils Relative: 0 % (ref 0–1)
HCT: 27 % — ABNORMAL LOW (ref 39.0–52.0)
Lymphocytes Relative: 28 % (ref 12–46)
MCHC: 31.9 g/dL (ref 30.0–36.0)
Monocytes Absolute: 2 10*3/uL — ABNORMAL HIGH (ref 0.1–1.0)
Neutro Abs: 3.8 10*3/uL (ref 1.7–7.7)
Platelets: 576 10*3/uL — ABNORMAL HIGH (ref 150–400)
RDW: 15.1 % (ref 11.5–15.5)
WBC: 8.1 10*3/uL (ref 4.0–10.5)

## 2013-06-02 LAB — URINALYSIS, ROUTINE W REFLEX MICROSCOPIC
Glucose, UA: NEGATIVE mg/dL
Hgb urine dipstick: NEGATIVE
Specific Gravity, Urine: 1.024 (ref 1.005–1.030)
Urobilinogen, UA: 1 mg/dL (ref 0.0–1.0)

## 2013-06-02 LAB — URINE MICROSCOPIC-ADD ON

## 2013-06-02 LAB — MAGNESIUM: Magnesium: 2.2 mg/dL (ref 1.5–2.5)

## 2013-06-02 MED ORDER — HYDROCODONE-ACETAMINOPHEN 5-325 MG PO TABS: 1.0000 | ORAL_TABLET | ORAL | Status: DC | PRN

## 2013-06-02 MED ORDER — HYDROMORPHONE HCL PF 1 MG/ML IJ SOLN
1.0000 mg | Freq: Once | INTRAMUSCULAR | Status: AC
  Administered 2013-06-02: 1 mg via INTRAVENOUS
  Filled 2013-06-02: qty 1

## 2013-06-02 MED ORDER — ONDANSETRON HCL 4 MG/2ML IJ SOLN
4.0000 mg | Freq: Once | INTRAMUSCULAR | Status: AC
  Administered 2013-06-02: 4 mg via INTRAVENOUS
  Filled 2013-06-02: qty 2

## 2013-06-02 MED ORDER — HYDROCODONE-ACETAMINOPHEN 5-325 MG PO TABS
1.0000 | ORAL_TABLET | Freq: Once | ORAL | Status: AC
  Administered 2013-06-02: 1 via ORAL
  Filled 2013-06-02: qty 1

## 2013-06-02 MED ORDER — SODIUM CHLORIDE 0.9 % IV BOLUS (SEPSIS)
1000.0000 mL | Freq: Once | INTRAVENOUS | Status: AC
  Administered 2013-06-02: 1000 mL via INTRAVENOUS

## 2013-06-02 MED ORDER — MORPHINE SULFATE 4 MG/ML IJ SOLN
4.0000 mg | Freq: Once | INTRAMUSCULAR | Status: AC
  Administered 2013-06-02: 4 mg via INTRAVENOUS
  Filled 2013-06-02 (×2): qty 1

## 2013-06-02 NOTE — ED Provider Notes (Signed)
CSN: [id number]     Arrival date & time 06/02/13  1456 History     First MD Initiated Contact with Patient 06/02/13 1516     Chief Complaint  Patient presents with  . Emesis   (Consider location/radiation/quality/duration/timing/severity/associated sxs/prior Treatment) HPI 32 year old male with a history of ulcerative colitis, Crohn's disease, schizophrenia, recent emergency department visits and admissions for nausea vomiting presents with nausea, vomiting, and abdominal pain.  Patient reports she's had about one month of symptoms, and has been in and out of the emergency department in the hospital with nausea, vomiting, low hemoglobin at one point, low potassium at another.  Notes he was having blood in his stool previously, however denies any black or bloody stool. Denies any hematemesis. Does note continuing nausea and vomiting about 3 times a day he reports he is unable to tolerate po.  Notes he's had some loose stool, however denies diarrhea. Notes abdominal pain that he describes as "flipping" in his stomach, also describes as cramping, sharp, and aching. It is located in the middle of his abdomen without radiation. Patient reports he seen yesterday in the emergency department, and briefly admitted, prior to discharge the same day. Reports he believes he was discharged because the hospital needed meds. Meds she is taking Zofran at home, but does not have pain medications, and is probably the Zofran is helping with his nausea. She has been lightheaded. Denies any history of abdominal surgery, denies fevers.  Notes he has an appointment with his GI doctor scheduled for September 17 and has been taking prednisone.  Past Medical History  Diagnosis Date  . Ulcerative colitis   . Schizophrenia   . GI (gastrointestinal bleed) 04/2013  . Crohn's disease    Past Surgical History  Procedure Laterality Date  . Mandible fracture surgery    . Flexible sigmoidoscopy  12/05/2011    Procedure: FLEXIBLE  SIGMOIDOSCOPY;  Surgeon: Beryle Beams, MD;  Location: [facility] ENDOSCOPY;  Service: Endoscopy;  Laterality: N/A;  . Esophagogastroduodenoscopy N/A 05/24/2013    Procedure: ESOPHAGOGASTRODUODENOSCOPY (EGD);  Surgeon: Ladene Artist, MD;  Location: [facility] ENDOSCOPY;  Service: Endoscopy;  Laterality: N/A;   Family History  Problem Relation Age of Onset  . Liver cancer     History  Substance Use Topics  . Smoking status: Current Every Day Smoker -- 0.25 packs/day for 20 years    Types: Cigarettes  . Smokeless tobacco: Never Used  . Alcohol Use: Yes     Comment: occasional    Review of Systems  Constitutional: Negative for fever.  HENT: Negative for sore throat and neck stiffness.   Eyes: Negative for visual disturbance.  Respiratory: Negative for shortness of breath.   Cardiovascular: Negative for chest pain.  Gastrointestinal: Positive for nausea, vomiting and abdominal pain. Negative for diarrhea, constipation, blood in stool and anal bleeding.  Genitourinary: Negative for dysuria and difficulty urinating.  Musculoskeletal: Negative for back pain.  Skin: Negative for rash.  Neurological: Positive for light-headedness. Negative for syncope and headaches.    Allergies  Depakote; Lithium; and Nsaids  Home Medications   Current Outpatient Rx  Name  Route  Sig  Dispense  Refill  . ferrous gluconate (FERGON) 216 MG tablet   Oral   Take 216 mg by mouth every morning.          . mesalamine (LIALDA) 1.2 G EC tablet   Oral   Take 4.8 g by mouth daily with breakfast.         .  OLANZapine (ZYPREXA) 5 MG tablet   Oral   Take 1 tablet (5 mg total) by mouth at bedtime.         . ondansetron (ZOFRAN) 8 MG tablet   Oral   Take 8 mg by mouth every 6 (six) hours as needed for nausea.         . pantoprazole (PROTONIX) 40 MG tablet   Oral   Take 40 mg by mouth 2 (two) times daily.         . potassium chloride (K-DUR) 10 MEQ tablet   Oral   Take 1 tablet (10 mEq total) by mouth 2  (two) times daily.   60 tablet   0   . predniSONE (DELTASONE) 10 MG tablet   Oral   Take 2 tablets (20 mg total) by mouth 2 (two) times daily.   120 tablet   2   . HYDROcodone-acetaminophen (NORCO/VICODIN) 5-325 MG per tablet   Oral   Take 1-2 tablets by mouth every 4 (four) hours as needed for pain.   12 tablet   0    BP 110/57  Pulse 100  Temp(Src) 98.7 F (37.1 C) (Oral)  Resp 16  SpO2 100% Physical Exam  Nursing note and vitals reviewed. Constitutional: He is oriented to person, place, and time. He appears well-developed and well-nourished. No distress.  HENT:  Head: Normocephalic and atraumatic.  Mouth/Throat: Oropharynx is clear and moist. No oropharyngeal exudate.  Eyes: Conjunctivae and EOM are normal.  Neck: Normal range of motion.  Cardiovascular: Normal rate, regular rhythm, normal heart sounds and intact distal pulses.  Exam reveals no gallop and no friction rub.   No murmur heard. Pulmonary/Chest: Effort normal and breath sounds normal. No respiratory distress. He has no wheezes. He has no rales.  Abdominal: Soft. He exhibits no distension. There is no tenderness. There is no rebound and no guarding.  abd soft, no tenderness, normal conversation during abdominal exam  Musculoskeletal: He exhibits no edema.  Neurological: He is alert and oriented to person, place, and time.  Skin: Skin is warm and dry. He is not diaphoretic.    ED Course   Procedures (including critical care time)  Labs Reviewed  URINALYSIS, ROUTINE W REFLEX MICROSCOPIC - Abnormal; Notable for the following:    APPearance CLOUDY (*)    Bilirubin Urine SMALL (*)    Leukocytes, UA TRACE (*)    All other components within normal limits  COMPREHENSIVE METABOLIC PANEL - Abnormal; Notable for the following:    Total Protein 5.8 (*)    Albumin 2.5 (*)    Total Bilirubin 0.1 (*)    All other components within normal limits  CBC WITH DIFFERENTIAL - Abnormal; Notable for the following:     RBC 3.26 (*)    Hemoglobin 8.6 (*)    HCT 27.0 (*)    Platelets 576 (*)    Monocytes Relative 25 (*)    Monocytes Absolute 2.0 (*)    All other components within normal limits  URINE MICROSCOPIC-ADD ON - Abnormal; Notable for the following:    Casts HYALINE CASTS (*)    All other components within normal limits  MAGNESIUM  LIPASE, BLOOD   No results found. 1. Abdominal pain   2. Nausea with vomiting     MDM  32 year old male with a history of ulcerative colitis, Crohn's disease, schizophrenia, recent emergency department visits and admissions for nausea vomiting presents with nausea, vomiting, and abdominal pain. Patient evaluated yesterday for same.  Lipase within normal limits and doubt pancreatitis. CMP without transaminitis, and electrolytes within normal limits. The lobby and then likely secondary to chronic malnutrition. CBC shows a stable anemia with a hemoglobin of 8.6, with a hemoglobin of 8.6 yesterday and prior hemoglobin of 8.4. Patient denies any melena/hematemesis or hematochezia and had a negative fecal occult test yesterday and doubt occult bleed given hgb stable. Urinalysis shows as his urinary tract infection. Patient conversational during abdominal pain without appearance of tenderness, and have low suspicion for appendicitis, cholecystitis, diverticulitis.  Patient given in boluses normal saline with normal vital signs, and no signs of dehydration on exam. Provided dilaudid and zofran for pain and nausea.  Patient requesting sandwiches and crackers prior to discharge and tolerates oral intake without issue.  Patient discharged in stable condition with understanding of reasons to return. Patient prescribed small amount of norco and instructed to follow up with PCP and GI as soon as able.   Alvino Chapel, MD 06/03/13 [phone]

## 2013-06-02 NOTE — ED Notes (Signed)
States just left hospital yesterday and today has been having n/v again that started this am after taking meds

## 2013-06-03 NOTE — ED Provider Notes (Signed)
Medical screening examination/treatment/procedure(s) were performed by non-physician practitioner and as supervising physician I was immediately available for consultation/collaboration.   Lolita Patella, MD 06/03/13 [phone]

## 2013-06-03 NOTE — ED Provider Notes (Signed)
Medical screening examination/treatment/procedure(s) were conducted as a shared visit with resident physician and myself.  I personally evaluated the patient during the encounter.  I interviewed and examined the patient. Abdomen is benign. Pt happy w/ sx control and likely d/c if feeling better.   Blanchard Kelch, MD 06/03/13 1141

## 2013-06-04 ENCOUNTER — Encounter (HOSPITAL_COMMUNITY): Payer: Self-pay | Admitting: *Deleted

## 2013-06-04 ENCOUNTER — Telehealth: Payer: Self-pay | Admitting: Gastroenterology

## 2013-06-04 ENCOUNTER — Emergency Department (HOSPITAL_COMMUNITY)
Admission: EM | Admit: 2013-06-04 | Discharge: 2013-06-04 | Disposition: A | Discharge status: Home / Self Care | Payer: Medicare Other | Attending: Emergency Medicine | Admitting: Emergency Medicine

## 2013-06-04 DIAGNOSIS — R109 Unspecified abdominal pain: Secondary | ICD-10-CM | POA: Insufficient documentation

## 2013-06-04 DIAGNOSIS — F17200 Nicotine dependence, unspecified, uncomplicated: Secondary | ICD-10-CM | POA: Insufficient documentation

## 2013-06-04 DIAGNOSIS — R5381 Other malaise: Secondary | ICD-10-CM | POA: Insufficient documentation

## 2013-06-04 DIAGNOSIS — Z79899 Other long term (current) drug therapy: Secondary | ICD-10-CM | POA: Insufficient documentation

## 2013-06-04 DIAGNOSIS — Z8719 Personal history of other diseases of the digestive system: Secondary | ICD-10-CM | POA: Insufficient documentation

## 2013-06-04 DIAGNOSIS — R112 Nausea with vomiting, unspecified: Secondary | ICD-10-CM | POA: Insufficient documentation

## 2013-06-04 DIAGNOSIS — R42 Dizziness and giddiness: Secondary | ICD-10-CM | POA: Insufficient documentation

## 2013-06-04 DIAGNOSIS — F209 Schizophrenia, unspecified: Secondary | ICD-10-CM | POA: Insufficient documentation

## 2013-06-04 LAB — CBC WITH DIFFERENTIAL/PLATELET
Basophils Absolute: 0 10*3/uL (ref 0.0–0.1)
Lymphocytes Relative: 10 % — ABNORMAL LOW (ref 12–46)
Lymphs Abs: 0.7 10*3/uL (ref 0.7–4.0)
Neutro Abs: 5.5 10*3/uL (ref 1.7–7.7)
Platelets: 541 10*3/uL — ABNORMAL HIGH (ref 150–400)
RBC: 3.61 MIL/uL — ABNORMAL LOW (ref 4.22–5.81)
RDW: 15 % (ref 11.5–15.5)
WBC: 6.8 10*3/uL (ref 4.0–10.5)

## 2013-06-04 LAB — URINALYSIS, ROUTINE W REFLEX MICROSCOPIC
Leukocytes, UA: NEGATIVE
Nitrite: NEGATIVE
Specific Gravity, Urine: 1.028 (ref 1.005–1.030)
Urobilinogen, UA: 1 mg/dL (ref 0.0–1.0)

## 2013-06-04 LAB — COMPREHENSIVE METABOLIC PANEL
Alkaline Phosphatase: 61 U/L (ref 39–117)
BUN: 13 mg/dL (ref 6–23)
Chloride: 101 mEq/L (ref 96–112)
Creatinine, Ser: 0.8 mg/dL (ref 0.50–1.35)
GFR calc Af Amer: >90 mL/min (ref >90)
Glucose, Bld: 90 mg/dL (ref 70–99)
Potassium: 4.1 mEq/L (ref 3.5–5.1)
Total Bilirubin: 0.2 mg/dL — ABNORMAL LOW (ref 0.3–1.2)
Total Protein: 5.8 g/dL — ABNORMAL LOW (ref 6.0–8.3)

## 2013-06-04 LAB — LIPASE, BLOOD: Lipase: 20 U/L (ref 11–59)

## 2013-06-04 MED ORDER — ONDANSETRON HCL 4 MG/2ML IJ SOLN
4.0000 mg | Freq: Once | INTRAMUSCULAR | Status: AC
  Administered 2013-06-04: 4 mg via INTRAVENOUS
  Filled 2013-06-04: qty 2

## 2013-06-04 MED ORDER — SODIUM CHLORIDE 0.9 % IV BOLUS (SEPSIS)
1000.0000 mL | Freq: Once | INTRAVENOUS | Status: AC
  Administered 2013-06-04: 1000 mL via INTRAVENOUS

## 2013-06-04 MED ORDER — FENTANYL CITRATE 0.05 MG/ML IJ SOLN
50.0000 ug | Freq: Once | INTRAMUSCULAR | Status: AC
  Administered 2013-06-04: 50 ug via INTRAVENOUS
  Filled 2013-06-04: qty 2

## 2013-06-04 MED ORDER — HYDROCODONE-ACETAMINOPHEN 5-325 MG PO TABS: 1.0000 | ORAL_TABLET | ORAL | Status: DC | PRN

## 2013-06-04 MED ORDER — FENTANYL CITRATE 0.05 MG/ML IJ SOLN
50.0000 ug | Freq: Once | INTRAMUSCULAR | Status: AC
Start: 2013-06-04 — End: 2013-06-04
  Administered 2013-06-04: 50 ug via INTRAVENOUS
  Filled 2013-06-04: qty 2

## 2013-06-04 MED ORDER — ONDANSETRON 4 MG PO TBDP: ORAL_TABLET | ORAL | Status: DC

## 2013-06-04 NOTE — Telephone Encounter (Signed)
Pt's mother wanted a note to say that her son was sick on 05/29/13 and was unable to make his court appearance.  I explained to the pt that he would not be able to get a note saying he was sick on 05/29/13 because he was not seen in this office.  She stated he was in the ER on 05/30/13.  I explained that the pt can sign up for my chart and print off the discharge summaries for the days he was seen in the ER and maybe the court system would accept that.  She said that she has all that information but would like to have Dr Ardis Hughs say that he was sick on the 14th.  I will forward to Dr Ardis Hughs for review.

## 2013-06-04 NOTE — ED Notes (Signed)
Multiple complaints. Reports abd pain, n/v for weeks. Having headache, bodyaches, nausea, feeling lightheaded. No acute distress noted at triage.

## 2013-06-04 NOTE — ED Provider Notes (Signed)
CSN: [id number]     Arrival date & time 06/04/13  1057 History     First MD Initiated Contact with Patient 06/04/13 1112     Chief Complaint  Patient presents with  . Abdominal Pain  . Emesis   (Consider location/radiation/quality/duration/timing/severity/associated sxs/prior Treatment) HPI Pt presenst for ongoing abd pain unchanged for the past month. He also c/o nausea and vomiting without diarrhea or constipation. No fever or chills. Pt has been seen multiple times recently for the same. He claims he is also having some lightheadedness when he stands up. Denies blood in stool or vomit. Pt recently given rx for narcotics and states he ran out this morning.  Past Medical History  Diagnosis Date  . Ulcerative colitis   . Schizophrenia   . GI (gastrointestinal bleed) 04/2013  . Crohn's disease    Past Surgical History  Procedure Laterality Date  . Mandible fracture surgery    . Flexible sigmoidoscopy  12/05/2011    Procedure: FLEXIBLE SIGMOIDOSCOPY;  Surgeon: Beryle Beams, MD;  Location: [facility] ENDOSCOPY;  Service: Endoscopy;  Laterality: N/A;  . Esophagogastroduodenoscopy N/A 05/24/2013    Procedure: ESOPHAGOGASTRODUODENOSCOPY (EGD);  Surgeon: Ladene Artist, MD;  Location: [facility] ENDOSCOPY;  Service: Endoscopy;  Laterality: N/A;   Family History  Problem Relation Age of Onset  . Liver cancer     History  Substance Use Topics  . Smoking status: Current Every Day Smoker -- 0.25 packs/day for 20 years    Types: Cigarettes  . Smokeless tobacco: Never Used  . Alcohol Use: Yes     Comment: occasional    Review of Systems  Constitutional: Positive for fatigue. Negative for fever and chills.  HENT: Negative for neck pain and neck stiffness.   Respiratory: Negative for cough, chest tightness and shortness of breath.   Cardiovascular: Negative for chest pain, palpitations and leg swelling.  Gastrointestinal: Positive for nausea, vomiting and abdominal pain. Negative for diarrhea and  constipation.  Genitourinary: Negative for dysuria and flank pain.  Musculoskeletal: Negative for myalgias and back pain.  Skin: Negative for rash and wound.  Neurological: Positive for dizziness and light-headedness. Negative for tremors, syncope, weakness and headaches.  All other systems reviewed and are negative.    Allergies  Depakote; Lithium; and Nsaids  Home Medications   Current Outpatient Rx  Name  Route  Sig  Dispense  Refill  . ferrous gluconate (FERGON) 216 MG tablet   Oral   Take 216 mg by mouth daily.          Marland Kitchen HYDROcodone-acetaminophen (NORCO/VICODIN) 5-325 MG per tablet   Oral   Take 1-2 tablets by mouth every 4 (four) hours as needed for pain.   12 tablet   0   . mesalamine (LIALDA) 1.2 G EC tablet   Oral   Take 4.8 g by mouth daily with breakfast.         . OLANZapine (ZYPREXA) 5 MG tablet   Oral   Take 1 tablet (5 mg total) by mouth at bedtime.         . ondansetron (ZOFRAN) 8 MG tablet   Oral   Take 8 mg by mouth every 6 (six) hours as needed for nausea.         . pantoprazole (PROTONIX) 40 MG tablet   Oral   Take 40 mg by mouth 2 (two) times daily.         . potassium chloride (K-DUR) 10 MEQ tablet   Oral   Take  1 tablet (10 mEq total) by mouth 2 (two) times daily.   60 tablet   0   . predniSONE (DELTASONE) 10 MG tablet   Oral   Take 2 tablets (20 mg total) by mouth 2 (two) times daily.   120 tablet   2   . HYDROcodone-acetaminophen (NORCO) 5-325 MG per tablet   Oral   Take 1 tablet by mouth every 4 (four) hours as needed for pain.   10 tablet   0   . ondansetron (ZOFRAN ODT) 4 MG disintegrating tablet      57m ODT q4 hours prn nausea/vomit   8 tablet   0    BP 112/74  Pulse 89  Temp(Src) 98.2 F (36.8 C) (Oral)  Resp 19  SpO2 100% Physical Exam  Nursing note and vitals reviewed. Constitutional: He is oriented to person, place, and time. He appears well-developed and well-nourished. No distress.  HENT:   Head: Normocephalic and atraumatic.  Mouth/Throat: Oropharynx is clear and moist.  Eyes: EOM are normal. Pupils are equal, round, and reactive to light.  Neck: Normal range of motion. Neck supple.  Cardiovascular: Normal rate and regular rhythm.   Pulmonary/Chest: Effort normal and breath sounds normal. No respiratory distress. He has no wheezes. He has no rales. He exhibits no tenderness.  Abdominal: Soft. Bowel sounds are normal. He exhibits no distension and no mass. There is tenderness (very mild diffuse abdominal tenderness. No focality. No rebound or guarding). There is no rebound and no guarding.  Musculoskeletal: Normal range of motion. He exhibits no edema and no tenderness.  Neurological: He is alert and oriented to person, place, and time.  5/5 motor in all ext, sensation intact  Skin: Skin is warm and dry. No rash noted. No erythema.  Psychiatric: He has a normal mood and affect. His behavior is normal.    ED Course   Procedures (including critical care time)  Labs Reviewed  COMPREHENSIVE METABOLIC PANEL - Abnormal; Notable for the following:    Total Protein 5.8 (*)    Albumin 2.8 (*)    Total Bilirubin 0.2 (*)    All other components within normal limits  CBC WITH DIFFERENTIAL - Abnormal; Notable for the following:    RBC 3.61 (*)    Hemoglobin 9.4 (*)    HCT 29.6 (*)    Platelets 541 (*)    Neutrophils Relative % 81 (*)    Lymphocytes Relative 10 (*)    All other components within normal limits  URINALYSIS, ROUTINE W REFLEX MICROSCOPIC - Abnormal; Notable for the following:    Color, Urine AMBER (*)    Bilirubin Urine SMALL (*)    Ketones, ur 15 (*)    All other components within normal limits  LIPASE, BLOOD   No results found. 1. Abdominal pain     MDM  Well appearing. No acute findings on exam or workup. Improved symptoms with IVF's, suspect mild dehydration. Advised f/u with GI MD. Return precautions given.   DJulianne Rice MD 06/04/13 [phone]

## 2013-06-05 NOTE — Telephone Encounter (Signed)
i agree, cannot write a note for a day that I didn't see him.

## 2013-06-12 ENCOUNTER — Other Ambulatory Visit (HOSPITAL_COMMUNITY): Payer: Self-pay | Admitting: Internal Medicine

## 2013-06-23 ENCOUNTER — Encounter (HOSPITAL_COMMUNITY): Payer: Self-pay | Admitting: Cardiology

## 2013-06-23 ENCOUNTER — Emergency Department (HOSPITAL_COMMUNITY)
Admission: EM | Admit: 2013-06-23 | Discharge: 2013-06-23 | Disposition: A | Discharge status: Home / Self Care | Payer: Medicare Other | Attending: Emergency Medicine | Admitting: Emergency Medicine

## 2013-06-23 DIAGNOSIS — R112 Nausea with vomiting, unspecified: Secondary | ICD-10-CM | POA: Insufficient documentation

## 2013-06-23 DIAGNOSIS — Z79899 Other long term (current) drug therapy: Secondary | ICD-10-CM | POA: Insufficient documentation

## 2013-06-23 DIAGNOSIS — Z888 Allergy status to other drugs, medicaments and biological substances status: Secondary | ICD-10-CM | POA: Insufficient documentation

## 2013-06-23 DIAGNOSIS — F17200 Nicotine dependence, unspecified, uncomplicated: Secondary | ICD-10-CM | POA: Insufficient documentation

## 2013-06-23 DIAGNOSIS — K5090 Crohn's disease, unspecified, without complications: Secondary | ICD-10-CM | POA: Insufficient documentation

## 2013-06-23 DIAGNOSIS — R509 Fever, unspecified: Secondary | ICD-10-CM | POA: Insufficient documentation

## 2013-06-23 DIAGNOSIS — F209 Schizophrenia, unspecified: Secondary | ICD-10-CM | POA: Insufficient documentation

## 2013-06-23 DIAGNOSIS — Z8719 Personal history of other diseases of the digestive system: Secondary | ICD-10-CM | POA: Insufficient documentation

## 2013-06-23 LAB — CBC WITH DIFFERENTIAL/PLATELET
Basophils Relative: 0 % (ref 0–1)
Hemoglobin: 8.8 g/dL — ABNORMAL LOW (ref 13.0–17.0)
Lymphs Abs: 1.5 10*3/uL (ref 0.7–4.0)
Monocytes Relative: 14 % — ABNORMAL HIGH (ref 3–12)
Neutro Abs: 6.3 10*3/uL (ref 1.7–7.7)
Neutrophils Relative %: 69 % (ref 43–77)
RBC: 3.83 MIL/uL — ABNORMAL LOW (ref 4.22–5.81)
WBC: 9.1 10*3/uL (ref 4.0–10.5)

## 2013-06-23 LAB — COMPREHENSIVE METABOLIC PANEL
Albumin: 2.9 g/dL — ABNORMAL LOW (ref 3.5–5.2)
Alkaline Phosphatase: 72 U/L (ref 39–117)
BUN: 8 mg/dL (ref 6–23)
Chloride: 105 mEq/L (ref 96–112)
Potassium: 3.7 mEq/L (ref 3.5–5.1)
Total Bilirubin: 0.1 mg/dL — ABNORMAL LOW (ref 0.3–1.2)

## 2013-06-23 LAB — URINALYSIS, DIPSTICK ONLY
Bilirubin Urine: NEGATIVE
Glucose, UA: NEGATIVE mg/dL
Hgb urine dipstick: NEGATIVE
Ketones, ur: NEGATIVE mg/dL
Leukocytes, UA: NEGATIVE
Protein, ur: NEGATIVE mg/dL

## 2013-06-23 LAB — LIPASE, BLOOD: Lipase: 29 U/L (ref 11–59)

## 2013-06-23 MED ORDER — ACETAMINOPHEN 500 MG PO TABS
1000.0000 mg | ORAL_TABLET | Freq: Once | ORAL | Status: AC
  Administered 2013-06-23: 1000 mg via ORAL
  Filled 2013-06-23: qty 2

## 2013-06-23 MED ORDER — ONDANSETRON 4 MG PO TBDP
4.0000 mg | ORAL_TABLET | Freq: Once | ORAL | Status: AC
  Administered 2013-06-23: 4 mg via ORAL
  Filled 2013-06-23: qty 1

## 2013-06-23 MED ORDER — ONDANSETRON HCL 4 MG PO TABS: 4.0000 mg | ORAL_TABLET | Freq: Four times a day (QID) | ORAL | Status: DC

## 2013-06-23 NOTE — ED Notes (Signed)
Pt reports headache, abd pain and n/v for the past 3 days. Reports that he had a fever the other day. Also with increased weakness. Denies any urinary symptoms or diarrhea. States pain is sharp in nature.

## 2013-06-23 NOTE — ED Provider Notes (Signed)
TIME SEEN: 3:16 PM  CHIEF COMPLAINT: Abdominal pain, nausea and vomiting  HPI: Patient is a 32 year old male with a history of substance abuse with drug seeking behavior, schizophrenia, ulcerative colitis on prednisone and mesalamine when he has acute flares who presents emergency Department 3 days of diffuse abdominal pain, nausea vomiting. Patient reports subjective fever several days ago. No diarrhea. No dysuria or hematuria. No bloody stools or melena. No penile discharge, testicular pain or swelling. No bad food exposures. No sick contacts. No recent travel. Patient reports a schizophrenia is doing well. No suicidal ideation, homicidal ideation, command hallucinations. He states he's been out of his mesalamine and prednisone for the past week. He reports he called his doctor today he was unable to see him and therefore he came to the emergency department.  ROS: See HPI Constitutional: no fever  Eyes: no drainage  ENT: no runny nose   Cardiovascular:  no chest pain  Resp: no SOB  GI: no vomiting GU: no dysuria Integumentary: no rash  Allergy: no hives  Musculoskeletal: no leg swelling  Neurological: no slurred speech ROS otherwise negative  PAST MEDICAL HISTORY/PAST SURGICAL HISTORY:  Past Medical History  Diagnosis Date  . Ulcerative colitis   . Schizophrenia   . GI (gastrointestinal bleed) 04/2013  . Crohn's disease     MEDICATIONS:  Prior to Admission medications   Medication Sig Start Date End Date Taking? Authorizing Provider  ferrous gluconate (FERGON) 216 MG tablet Take 216 mg by mouth daily.  05/25/13   Reyne Dumas, MD  HYDROcodone-acetaminophen (NORCO) 5-325 MG per tablet Take 1 tablet by mouth every 4 (four) hours as needed for pain. 06/04/13   Julianne Rice, MD  HYDROcodone-acetaminophen (NORCO/VICODIN) 5-325 MG per tablet Take 1-2 tablets by mouth every 4 (four) hours as needed for pain. 06/02/13   Alvino Chapel, MD  mesalamine (LIALDA) 1.2 G EC tablet  Take 4.8 g by mouth daily with breakfast. 05/25/13   Reyne Dumas, MD  OLANZapine (ZYPREXA) 5 MG tablet Take 1 tablet (5 mg total) by mouth at bedtime. 05/08/13   Delfina Redwood, MD  ondansetron (ZOFRAN ODT) 4 MG disintegrating tablet 56m ODT q4 hours prn nausea/vomit 06/04/13   DJulianne Rice MD  ondansetron (ZOFRAN) 8 MG tablet Take 8 mg by mouth every 6 (six) hours as needed for nausea.    Historical Provider, MD  pantoprazole (PROTONIX) 40 MG tablet Take 40 mg by mouth 2 (two) times daily. 05/25/13   NReyne Dumas MD  potassium chloride (K-DUR) 10 MEQ tablet Take 1 tablet (10 mEq total) by mouth 2 (two) times daily. 05/08/13   CDelfina Redwood MD  predniSONE (DELTASONE) 10 MG tablet Take 2 tablets (20 mg total) by mouth 2 (two) times daily. 05/21/13   DMilus Banister MD    ALLERGIES:  Allergies  Allergen Reactions  . Depakote [Divalproex Sodium] Other (See Comments)    Reaction: seizures   . Lithium Other (See Comments)    Reaction: rectal bleeding  . Nsaids Other (See Comments)    Reaction: rectal bleeding    SOCIAL HISTORY:  History  Substance Use Topics  . Smoking status: Current Every Day Smoker -- 0.25 packs/day for 20 years    Types: Cigarettes  . Smokeless tobacco: Never Used  . Alcohol Use: Yes     Comment: occasional    FAMILY HISTORY: Family History  Problem Relation Age of Onset  . Liver cancer      EXAM: BP 131/76  Pulse  110  Temp(Src) 98.7 F (37.1 C) (Oral)  Resp 18  SpO2 100% CONSTITUTIONAL: Alert and oriented and responds appropriately to questions. Well-appearing; well-nourished HEAD: Normocephalic EYES: Conjunctivae clear, PERRL ENT: normal nose; no rhinorrhea; moist mucous membranes; pharynx without lesions noted NECK: Supple, no meningismus, no LAD  CARD: RRR; S1 and S2 appreciated; no murmurs, no clicks, no rubs, no gallops RESP: Normal chest excursion without splinting or tachypnea; breath sounds clear and equal bilaterally; no wheezes, no  rhonchi, no rales,  ABD/GI: Normal bowel sounds; non-distended; soft, non-tender to deep palpation, no rebound, no guarding BACK:  The back appears normal and is non-tender to palpation, there is no CVA tenderness EXT: Normal ROM in all joints; non-tender to palpation; no edema; normal capillary refill; no cyanosis    SKIN: Normal color for age and race; warm NEURO: Moves all extremities equally PSYCH: The patient's mood and manner are appropriate. Grooming and personal hygiene are appropriate.  MEDICAL DECISION MAKING: Patient with ulcerative colitis who presents with 3 days of abdominal pain, nausea and vomiting. He is well-appearing on exam and his abdominal exam is completely benign. Very low concern for UC flare at this time or other intra-abdominal pathology. Will obtain basic labs, urinalysis. Will give oral Zofran and oral fluids. I do not feel patient needs IV fluids at this time as he does not appear dehydrated on exam. His initial tachycardia had resolved upon my examination and his heart rate is in the 80s.  ED PROGRESS: Patient's labs are unremarkable. He has been able to tolerate by mouth without difficulty. No further vomiting in the ED. His abdominal exam is still completely benign. Urinalysis pending. Discussed with patient that he needs to followup with his primary care physician to have his medications including his chronic Vicodin refilled. Discussed with patient that I have no indication to give him narcotic pain medication at this time. Offered tylenol and ibuprofen.  Given return precautions. Patient verbalizes understanding and is comfortable with plan.  5:34 PM  Pt refuses Tylenol and repeatedly asked for narcotics. Have explained to patient multiple times I do not feel he needs narcotic pain medication at this time. Have offered alternative medications which he refuses. Urinalysis is unremarkable. We'll discharge him with outpatient gastroenterology followup and return  precautions.  [place], DO 06/23/13 1735

## 2013-06-26 ENCOUNTER — Emergency Department (HOSPITAL_COMMUNITY): Payer: Medicare Other

## 2013-06-26 ENCOUNTER — Encounter (HOSPITAL_COMMUNITY): Payer: Self-pay

## 2013-06-26 ENCOUNTER — Emergency Department (HOSPITAL_COMMUNITY)
Admission: EM | Admit: 2013-06-26 | Discharge: 2013-06-27 | Disposition: A | Discharge status: Home / Self Care | Payer: Medicare Other | Attending: Emergency Medicine | Admitting: Emergency Medicine

## 2013-06-26 DIAGNOSIS — T43501A Poisoning by unspecified antipsychotics and neuroleptics, accidental (unintentional), initial encounter: Secondary | ICD-10-CM | POA: Insufficient documentation

## 2013-06-26 DIAGNOSIS — K5090 Crohn's disease, unspecified, without complications: Secondary | ICD-10-CM | POA: Insufficient documentation

## 2013-06-26 DIAGNOSIS — F209 Schizophrenia, unspecified: Secondary | ICD-10-CM | POA: Insufficient documentation

## 2013-06-26 DIAGNOSIS — R443 Hallucinations, unspecified: Secondary | ICD-10-CM | POA: Insufficient documentation

## 2013-06-26 DIAGNOSIS — T50992A Poisoning by other drugs, medicaments and biological substances, intentional self-harm, initial encounter: Secondary | ICD-10-CM | POA: Insufficient documentation

## 2013-06-26 DIAGNOSIS — T43502A Poisoning by unspecified antipsychotics and neuroleptics, intentional self-harm, initial encounter: Secondary | ICD-10-CM | POA: Insufficient documentation

## 2013-06-26 DIAGNOSIS — T50902A Poisoning by unspecified drugs, medicaments and biological substances, intentional self-harm, initial encounter: Secondary | ICD-10-CM

## 2013-06-26 DIAGNOSIS — T450X4A Poisoning by antiallergic and antiemetic drugs, undetermined, initial encounter: Secondary | ICD-10-CM | POA: Insufficient documentation

## 2013-06-26 DIAGNOSIS — F17200 Nicotine dependence, unspecified, uncomplicated: Secondary | ICD-10-CM | POA: Insufficient documentation

## 2013-06-26 DIAGNOSIS — Z79899 Other long term (current) drug therapy: Secondary | ICD-10-CM | POA: Insufficient documentation

## 2013-06-26 DIAGNOSIS — R45851 Suicidal ideations: Secondary | ICD-10-CM | POA: Insufficient documentation

## 2013-06-26 DIAGNOSIS — T380X1A Poisoning by glucocorticoids and synthetic analogues, accidental (unintentional), initial encounter: Secondary | ICD-10-CM | POA: Insufficient documentation

## 2013-06-26 DIAGNOSIS — IMO0002 Reserved for concepts with insufficient information to code with codable children: Secondary | ICD-10-CM | POA: Insufficient documentation

## 2013-06-26 HISTORY — DX: Poisoning by unspecified drugs, medicaments and biological substances, intentional self-harm, initial encounter: T50.902A

## 2013-06-26 LAB — RAPID URINE DRUG SCREEN, HOSP PERFORMED
Barbiturates: NOT DETECTED
Benzodiazepines: NOT DETECTED
Cocaine: NOT DETECTED
Opiates: NOT DETECTED
Tetrahydrocannabinol: NOT DETECTED

## 2013-06-26 LAB — CBC WITH DIFFERENTIAL/PLATELET
Basophils Absolute: 0 10*3/uL (ref 0.0–0.1)
Basophils Relative: 1 % (ref 0–1)
Eosinophils Absolute: 0.1 10*3/uL (ref 0.0–0.7)
Eosinophils Relative: 2 % (ref 0–5)
HCT: 23.6 % — ABNORMAL LOW (ref 39.0–52.0)
MCH: 23.2 pg — ABNORMAL LOW (ref 26.0–34.0)
MCHC: 29.7 g/dL — ABNORMAL LOW (ref 30.0–36.0)
MCV: 78.1 fL (ref 78.0–100.0)
Monocytes Absolute: 1.3 10*3/uL — ABNORMAL HIGH (ref 0.1–1.0)
RDW: 16 % — ABNORMAL HIGH (ref 11.5–15.5)

## 2013-06-26 LAB — LIPASE, BLOOD: Lipase: 36 U/L (ref 11–59)

## 2013-06-26 LAB — COMPREHENSIVE METABOLIC PANEL
AST: 11 U/L (ref 0–37)
Albumin: 2.2 g/dL — ABNORMAL LOW (ref 3.5–5.2)
BUN: 8 mg/dL (ref 6–23)
Calcium: 7.9 mg/dL — ABNORMAL LOW (ref 8.4–10.5)
Creatinine, Ser: 0.85 mg/dL (ref 0.50–1.35)

## 2013-06-26 MED ORDER — ONDANSETRON HCL 4 MG PO TABS: 4.0000 mg | ORAL_TABLET | Freq: Three times a day (TID) | ORAL | Status: DC | PRN

## 2013-06-26 MED ORDER — POTASSIUM CHLORIDE CRYS ER 20 MEQ PO TBCR
40.0000 meq | EXTENDED_RELEASE_TABLET | Freq: Once | ORAL | Status: AC
  Administered 2013-06-26: 40 meq via ORAL
  Filled 2013-06-26: qty 2

## 2013-06-26 MED ORDER — ZOLPIDEM TARTRATE 5 MG PO TABS
5.0000 mg | ORAL_TABLET | Freq: Every evening | ORAL | Status: DC | PRN
  Filled 2013-06-26: qty 1

## 2013-06-26 MED ORDER — LORAZEPAM 1 MG PO TABS: 1.0000 mg | ORAL_TABLET | Freq: Three times a day (TID) | ORAL | Status: DC | PRN

## 2013-06-26 MED ORDER — NICOTINE 21 MG/24HR TD PT24: 21.0000 mg | MEDICATED_PATCH | Freq: Every day | TRANSDERMAL | Status: DC

## 2013-06-26 MED ORDER — OLANZAPINE 5 MG PO TABS
5.0000 mg | ORAL_TABLET | Freq: Every day | ORAL | Status: DC
  Administered 2013-06-26: 5 mg via ORAL
  Filled 2013-06-26: qty 1

## 2013-06-26 MED ORDER — ALUM & MAG HYDROXIDE-SIMETH 200-200-20 MG/5ML PO SUSP: 30.0000 mL | ORAL | Status: DC | PRN

## 2013-06-26 MED ORDER — PANTOPRAZOLE SODIUM 40 MG PO TBEC
40.0000 mg | DELAYED_RELEASE_TABLET | Freq: Two times a day (BID) | ORAL | Status: DC
  Administered 2013-06-26 – 2013-06-27 (×3): 40 mg via ORAL
  Filled 2013-06-26 (×3): qty 1

## 2013-06-26 MED ORDER — ACETAMINOPHEN 325 MG PO TABS: 650.0000 mg | ORAL_TABLET | ORAL | Status: DC | PRN

## 2013-06-26 MED ORDER — MESALAMINE 1.2 G PO TBEC
4.8000 g | DELAYED_RELEASE_TABLET | Freq: Every day | ORAL | Status: DC
  Administered 2013-06-27: 4.8 g via ORAL
  Filled 2013-06-26 (×3): qty 4

## 2013-06-26 MED ORDER — IOHEXOL 300 MG/ML  SOLN
100.0000 mL | Freq: Once | INTRAMUSCULAR | Status: AC | PRN
  Administered 2013-06-26: 100 mL via INTRAVENOUS

## 2013-06-26 MED ORDER — FERROUS GLUCONATE 324 (38 FE) MG PO TABS
324.0000 mg | ORAL_TABLET | Freq: Every day | ORAL | Status: DC
  Administered 2013-06-27: 324 mg via ORAL
  Filled 2013-06-26 (×2): qty 1

## 2013-06-26 MED ORDER — IOHEXOL 300 MG/ML  SOLN
50.0000 mL | Freq: Once | INTRAMUSCULAR | Status: AC | PRN
  Administered 2013-06-26: 50 mL via ORAL

## 2013-06-26 NOTE — ED Notes (Signed)
Eating breakfast 

## 2013-06-26 NOTE — ED Notes (Signed)
Patient alert ambulating to bathroom.  Eating breakfast.

## 2013-06-26 NOTE — ED Notes (Signed)
Notified charge RN of need of suicide sitter.

## 2013-06-26 NOTE — Consult Note (Signed)
  Psychiatric Specialty Exam: @PHYSEXAMBYAGE2 @  @ROS @  Blood pressure 114/60, pulse 80, temperature 99.4 F (37.4 C), temperature source Oral, resp. rate 16, SpO2 99.00%.There is no weight on file to calculate BMI.  General Appearance: Casual  Eye Contact::  Good  Speech:  Clear and Coherent and Normal Rate  Volume:  Normal  Mood:  Irritable  Affect:  Constricted  Thought Process:  Goal Directed  Orientation:  Full (Time, Place, and Person)  Thought Content:  Hallucinations: Auditory  Suicidal Thoughts:  Yes.  with intent/plan  Homicidal Thoughts:  No  Memory:  Immediate;   Fair Recent;   Fair Remote;   Fair  Judgement:  Impaired  Insight:  Lacking  Psychomotor Activity:  Normal  Concentration:  Fair  Recall:  Fair  Akathisia:  Negative  Handed:  Right  AIMS (if indicated):  0   Assets:  Communication Skills Desire for Improvement  Sleep:   OK   Mr Dudenhoeffer remains depressed.  Says he still wants to kill himself.  He is calm and cooperative.  He has been hearing voices but does not seem preoccupied with them at the moment.  He could use inpatient stay at [facility] if a bed is available and if not, at whatever facility can be found.

## 2013-06-26 NOTE — ED Notes (Signed)
Bed: YF11 Expected date: 06/26/13 Expected time: 1:46 AM Means of arrival: Ambulance Comments: suicidal

## 2013-06-26 NOTE — ED Notes (Signed)
Attempted to get stool sample. No stool in vault.

## 2013-06-26 NOTE — ED Notes (Signed)
Pt's mother into see

## 2013-06-26 NOTE — ED Notes (Addendum)
Pt belongings consist of 1 pair of [place] tennis shoes, 1 t-shirt, 1 pair of black jeans, 1 pair of black boxers, 1 pair of black socks, and 1 identification card in front jean pocket. Pt's medication prednisone and reglen also with belongs.  Pt belongings placed in locker #39

## 2013-06-26 NOTE — ED Notes (Signed)
Up to the desk on the phone 

## 2013-06-26 NOTE — ED Notes (Addendum)
Up the bathroom

## 2013-06-26 NOTE — ED Notes (Signed)
Sitter at bedside.

## 2013-06-26 NOTE — BH Assessment (Signed)
Assessment Note   Patient is a 32 year old black male that reports SI with a plan to cut himself or take an overdose of medication.  Patient repots that he sees and hears things that no one else can see. Patient states that he has been having auditory and visual hallucinations.   Patient was not able to describe what he was seeing.  Documentation in the epic chart reports that the patient has a history of drug-seeking behavior, schizophrenia, and suicide by drug ingestion .  Patient was brought to the ER after he ingested Reglan, Zyprexa, and prednisone. Patient states that he took these medications in an attempt to kill himself.   Patient reports prior psychiatric hospitalizations.  Patient was not able to remember all of the hospitals or the year in which he was hospitalized.  Patient acknowledged receiving medication management and outpatient therapy.  However, the patient was not able to remember the name of the facility.  Patient reports that he takes his medication as prescribed but he was not able to remember the names of any medication that he is currently taking.  Patient was orientated to time, place, date and situation.    Patient was very irritable and agitated during the assessment. Patient states that hallucinations increase with agitation and that he has been increasingly aggravated by people in his life. Patient would not given a specific name of a person that he was aggravated with.  Patient denies HI.  Patient denies violence or harm to others.  Patient denies wanting to physically harm any one.  Patient denies any criminal charges or upcoming court case involving violence.    During the assessment patient on acknowledged occasional alcohol usage.  Patient repots that his last drink was last month.  However, documentation in the epic chart reports that the patient last used crack last week and drank alcohol last night.  Patients BAL is <11 and his UDS is negative.         Axis I:  Schizophrenia  Axis II: Deferred Axis III:  Past Medical History  Diagnosis Date  . Ulcerative colitis   . Schizophrenia   . GI (gastrointestinal bleed) 04/2013  . Crohn's disease   . Suicide attempt by drug ingestion    Axis IV: economic problems, housing problems, occupational problems, other psychosocial or environmental problems, problems related to social environment and problems with primary support group Axis V: 21-30 behavior considerably influenced by delusions or hallucinations OR serious impairment in judgment, communication OR inability to function in almost all areas  Past Medical History:  Past Medical History  Diagnosis Date  . Ulcerative colitis   . Schizophrenia   . GI (gastrointestinal bleed) 04/2013  . Crohn's disease   . Suicide attempt by drug ingestion     Past Surgical History  Procedure Laterality Date  . Mandible fracture surgery    . Flexible sigmoidoscopy  12/05/2011    Procedure: FLEXIBLE SIGMOIDOSCOPY;  Surgeon: Beryle Beams, MD;  Location: [facility] ENDOSCOPY;  Service: Endoscopy;  Laterality: N/A;  . Esophagogastroduodenoscopy N/A 05/24/2013    Procedure: ESOPHAGOGASTRODUODENOSCOPY (EGD);  Surgeon: Ladene Artist, MD;  Location: [facility] ENDOSCOPY;  Service: Endoscopy;  Laterality: N/A;    Family History:  Family History  Problem Relation Age of Onset  . Liver cancer      Social History:  reports that he has been smoking Cigarettes.  He has a 5 pack-year smoking history. He has never used smokeless tobacco. He reports that  drinks alcohol.  He reports that he uses illicit drugs (Cocaine and Marijuana).  Additional Social History:     CIWA: CIWA-Ar BP: 114/60 mmHg Pulse Rate: 80 COWS:    Allergies:  Allergies  Allergen Reactions  . Depakote [Divalproex Sodium] Other (See Comments)    Reaction: seizures   . Lithium Other (See Comments)    Reaction: rectal bleeding  . Nsaids Other (See Comments)    Reaction: rectal bleeding    Home Medications:   (Not in a hospital admission)  OB/GYN Status:  No LMP for male patient.  General Assessment Data Location of Assessment: [facility] ED Is this a Tele or Face-to-Face Assessment?: Face-to-Face Is this an Initial Assessment or a Re-assessment for this encounter?: Initial Assessment Living Arrangements: Parent Can pt return to current living arrangement?: Yes Admission Status: Voluntary Is patient capable of signing voluntary admission?: Yes Transfer from: [facility] Referral Source: Self/Family/Friend  Medical Screening Exam ([place]) Medical Exam completed: Yes  [facility] Crisis Care Plan Living Arrangements: Parent  Education Status Is patient currently in school?: No  Risk to self Suicidal Ideation: Yes-Currently Present Suicidal Intent: Yes-Currently Present Is patient at risk for suicide?: Yes Suicidal Plan?: Yes-Currently Present Specify Current Suicidal Plan: cut himself Access to Means: Yes Specify Access to Suicidal Means: anything sharp  What has been your use of drugs/alcohol within the last 12 months?: Alcohol  Previous Attempts/Gestures:  (Patient did would not answer) How many times?:  (Patient would not answer ) Other Self Harm Risks:  (None ) Triggers for Past Attempts: Unpredictable Intentional Self Injurious Behavior: Cutting Comment - Self Injurious Behavior:  (Patient would not say where) Family Suicide History: No Recent stressful life event(s):  (Patient denies any stressful event) Persecutory voices/beliefs?: Yes Depression: No Substance abuse history and/or treatment for substance abuse?: No Suicide prevention information given to non-admitted patients: Yes  Risk to Others Homicidal Ideation: No Thoughts of Harm to Others: No Current Homicidal Intent: No Current Homicidal Plan: No Access to Homicidal Means: No Identified Victim: None  History of harm to others?: No Assessment of Violence: None Noted Violent Behavior Description:  quiet Does patient have access to weapons?: No Criminal Charges Pending?: No Does patient have a court date: No  Psychosis Hallucinations: Auditory;Visual Delusions: None noted  Mental Status Report Appear/Hygiene: Disheveled;Body odor Eye Contact: Poor Motor Activity: Freedom of movement Speech: Pressured;Soft;Argumentative Level of Consciousness: Irritable Mood: Ambivalent;Preoccupied Affect: Apprehensive;Blunted Anxiety Level: None Thought Processes: Circumstantial Judgement: Unimpaired Orientation: Person;Place;Time;Situation Obsessive Compulsive Thoughts/Behaviors: None  Cognitive Functioning Concentration: Decreased Memory: Recent Intact;Remote Intact IQ: Average Insight: Poor Impulse Control: Poor Appetite: Fair Weight Loss: 0 Weight Gain: 0 Sleep: No Change Total Hours of Sleep:  (Patient reports that he did not know) Vegetative Symptoms: Decreased grooming  ADLScreening [facility] Assessment Services) Patient's cognitive ability adequate to safely complete daily activities?: Yes Patient able to express need for assistance with ADLs?: Yes Independently performs ADLs?: Yes (appropriate for developmental age)  Prior Inpatient Therapy Prior Inpatient Therapy: Yes Prior Therapy Dates: Unable to remember Prior Therapy Facilty/Provider(s): [facility] Reason for Treatment: Hearing voices  Prior Outpatient Therapy Prior Outpatient Therapy: Yes Prior Therapy Dates: ongoing  Prior Therapy Facilty/Provider(s): Unable to remember the name.  Patient thinks that it is [facility].  Reason for Treatment: medicatoin management and outpatient therapy  ADL Screening (condition at time of admission) Patient's cognitive ability adequate to safely complete daily activities?: Yes Patient able to express need for assistance with ADLs?: Yes Independently performs ADLs?: Yes (appropriate for developmental age)  Home Assistive Devices/Equipment Home Assistive Devices/Equipment: None       Values / Beliefs Cultural Requests During Hospitalization: None Spiritual Requests During Hospitalization: None        Additional Information 1:1 In Past 12 Months?: No CIRT Risk: No Elopement Risk: No Does patient have medical clearance?: Yes     Disposition: Inpatient hospitalization for psychiatric stablization and medication evaluation.  Disposition Initial Assessment Completed for this Encounter: Yes Disposition of Patient: Inpatient treatment program Type of inpatient treatment program: Adult  On Site Evaluation by:   Reviewed with Physician:    Graciella Freer [place] 06/26/2013 6:50 AM

## 2013-06-26 NOTE — ED Notes (Signed)
Pt to the bathroom on arrival

## 2013-06-26 NOTE — ED Notes (Signed)
Report called to [facility] in psych ED.

## 2013-06-26 NOTE — ED Provider Notes (Signed)
CSN: [id number]     Arrival date & time 06/26/13  0157 History   First MD Initiated Contact with Patient 06/26/13 0210     Chief Complaint  Patient presents with  . Ingestion  . Suicide Attempt   (Consider location/radiation/quality/duration/timing/severity/associated sxs/prior Treatment) HPI Comments: Patient is a 32 year old male with a history of ulcerative colitis with a history of drug-seeking behavior, schizophrenia, and suicide by drug ingestion who presents for suicidal ideation and ingestion of Reglan, Zyprexa, and prednisone. Patient states that he took these medications in an attempt to kill himself. Patient states that he has been having auditory and visual hallucinations consistent with his schizophrenia. Patient states that hallucinations increase with agitation and that he has been increasingly aggravated by people in his life. Patient continuing to endorse suicidal thoughts. He denies homicidal ideation. Patient states he last used crack last week and drank alcohol last night. Patient denies chest pain, shortness of breath, nausea or or vomiting, diarrhea, melena or hematochezia, urinary symptoms. Patient states he had a bowel movement yesterday which was normal in color and consistency and free of blood.  Patient is a 32 y.o. male presenting with Ingested Medication. The history is provided by the patient. No language interpreter was used.  Ingestion Pertinent negatives include no chest pain, nausea or vomiting.    Past Medical History  Diagnosis Date  . Ulcerative colitis   . Schizophrenia   . GI (gastrointestinal bleed) 04/2013  . Crohn's disease   . Suicide attempt by drug ingestion    Past Surgical History  Procedure Laterality Date  . Mandible fracture surgery    . Flexible sigmoidoscopy  12/05/2011    Procedure: FLEXIBLE SIGMOIDOSCOPY;  Surgeon: Beryle Beams, MD;  Location: [facility] ENDOSCOPY;  Service: Endoscopy;  Laterality: N/A;  . Esophagogastroduodenoscopy N/A  05/24/2013    Procedure: ESOPHAGOGASTRODUODENOSCOPY (EGD);  Surgeon: Ladene Artist, MD;  Location: [facility] ENDOSCOPY;  Service: Endoscopy;  Laterality: N/A;   Family History  Problem Relation Age of Onset  . Liver cancer     History  Substance Use Topics  . Smoking status: Current Every Day Smoker -- 0.25 packs/day for 20 years    Types: Cigarettes  . Smokeless tobacco: Never Used  . Alcohol Use: Yes     Comment: occasional    Review of Systems  Respiratory: Negative for shortness of breath.   Cardiovascular: Negative for chest pain.  Gastrointestinal: Negative for nausea and vomiting.  Psychiatric/Behavioral: Positive for suicidal ideas and agitation.  All other systems reviewed and are negative.    Allergies  Depakote; Lithium; and Nsaids  Home Medications   Current Outpatient Rx  Name  Route  Sig  Dispense  Refill  . ferrous gluconate (FERGON) 216 MG tablet   Oral   Take 216 mg by mouth daily.          Marland Kitchen HYDROcodone-acetaminophen (NORCO) 5-325 MG per tablet   Oral   Take 1 tablet by mouth every 4 (four) hours as needed for pain.   10 tablet   0   . mesalamine (LIALDA) 1.2 G EC tablet   Oral   Take 4.8 g by mouth daily with breakfast.         . OLANZapine (ZYPREXA) 5 MG tablet   Oral   Take 1 tablet (5 mg total) by mouth at bedtime.         . ondansetron (ZOFRAN) 4 MG tablet   Oral   Take 1 tablet (4 mg total) by mouth  every 6 (six) hours.   12 tablet   0   . ondansetron (ZOFRAN) 8 MG tablet   Oral   Take 8 mg by mouth every 6 (six) hours as needed for nausea.         . pantoprazole (PROTONIX) 40 MG tablet   Oral   Take 40 mg by mouth 2 (two) times daily.         . potassium chloride (K-DUR) 10 MEQ tablet   Oral   Take 1 tablet (10 mEq total) by mouth 2 (two) times daily.   60 tablet   0    BP 114/60  Pulse 80  Temp(Src) 99.4 F (37.4 C) (Oral)  Resp 16  SpO2 99%  Physical Exam  Nursing note and vitals reviewed. Constitutional: He  is oriented to person, place, and time. He appears well-developed and well-nourished. No distress.  HENT:  Head: Normocephalic and atraumatic.  Mouth/Throat: Oropharynx is clear and moist. No oropharyngeal exudate.  Airway patent; patient tolerating secretions without difficulty.  Eyes: Conjunctivae and EOM are normal. Pupils are equal, round, and reactive to light. No scleral icterus.  Neck: Normal range of motion. Neck supple.  Cardiovascular: Regular rhythm and normal heart sounds.   Tachycardic rate  Pulmonary/Chest: Effort normal. No respiratory distress. He has no wheezes. He has no rales.  Abdominal: Soft. Bowel sounds are normal. He exhibits no distension and no mass. There is tenderness (generalized, nonfocal). There is no rebound and no guarding.  No peritoneal signs  Musculoskeletal: Normal range of motion.  Neurological: He is alert and oriented to person, place, and time.  Skin: Skin is warm and dry. No rash noted. He is not diaphoretic. No erythema. No pallor.  Psychiatric: He has a normal mood and affect. His behavior is normal.    ED Course  Procedures (including critical care time) Labs Review Labs Reviewed  CBC WITH DIFFERENTIAL - Abnormal; Notable for the following:    RBC 3.02 (*)    Hemoglobin 7.0 (*)    HCT 23.6 (*)    MCH 23.2 (*)    MCHC 29.7 (*)    RDW 16.0 (*)    Platelets 537 (*)    Monocytes Relative 18 (*)    Monocytes Absolute 1.3 (*)    All other components within normal limits  COMPREHENSIVE METABOLIC PANEL - Abnormal; Notable for the following:    Potassium 3.0 (*)    Glucose, Bld 121 (*)    Calcium 7.9 (*)    Total Protein 5.0 (*)    Albumin 2.2 (*)    Total Bilirubin 0.1 (*)    All other components within normal limits  SALICYLATE LEVEL - Abnormal; Notable for the following:    Salicylate Lvl <2.5 (*)    All other components within normal limits  URINE RAPID DRUG SCREEN (HOSP PERFORMED)  ETHANOL  ACETAMINOPHEN LEVEL  LIPASE, BLOOD    Imaging Review Ct Abdomen Pelvis W Contrast  06/26/2013   *RADIOLOGY REPORT*  Clinical Data: Abdominal pain after ingestion of pills for suicide attempt.  History of Crohn's and ulcerative colitis.  CT ABDOMEN AND PELVIS WITH CONTRAST  Technique:  Multidetector CT imaging of the abdomen and pelvis was performed following the standard protocol during bolus administration of intravenous contrast.  Contrast: 110m OMNIPAQUE IOHEXOL 300 MG/ML  SOLN  Comparison: 04/27/2013  Findings: Dependent atelectasis in the lung bases.  The liver, spleen, gallbladder, pancreas, adrenal glands, kidneys, abdominal aorta, and inferior vena cava are unremarkable. Retroaortic left  renal vein.  Mild prominence of mesenteric and retroperitoneal lymph nodes without pathologic enlargement.  This is stable since previous study.  Lymph nodes are likely reactive. The stomach is decompressed.  Small bowel are not distended.  And there is evidence of wall thickening in the segment of mid abdominal small bowel anteriorly.  This is likely related to history of inflammatory bowel disease.  Stool filled colon without distension.  There is mild thickening of the descending colonic wall. Descending and sigmoid colonic wall thickening is improved since previous study.  No free air or free fluid in the abdomen.  Pelvis:  Prostate gland is mildly enlarged, measuring 3.8 x 4.5 cm. Bladder wall is mildly thickened.  This could be due to hypertrophy or cystitis.  Pelvic lymph nodes are likely reactive without pathologic enlargement.  The appendix is normal.  No free or loculated pelvic fluid collections.  Normal alignment of the lumbar spine.  IMPRESSION: Segmental wall thickening and nine the anterior mid abdominal small bowel loop and in the descending and sigmoid colon consistent with history of inflammatory bowel disease.  Colonic wall thickening is improved since previous study.  Bowel wall thickening is progressed.  Lymph nodes in the mesentery,  retroperitoneum, and pelvis are probably reactive.  Mild prostate enlargement.   Original Report Authenticated By: Lucienne Capers, M.D.   Dg Abd Acute W/chest  06/26/2013   *RADIOLOGY REPORT*  Clinical Data: Upper abdominal pain and nausea.  ACUTE ABDOMEN SERIES (ABDOMEN 2 VIEW & CHEST 1 VIEW)  Comparison: 05/30/2013  Findings: Slightly shallow inspiration. The heart size and pulmonary vascularity are normal. The lungs appear clear and expanded without focal air space disease or consolidation. No blunting of the costophrenic angles.  No pneumothorax.  Mediastinal contours appear intact.  Gas and stool throughout the colon.  No small or large bowel distension.  No free intra-abdominal air.  No abnormal air fluid levels.  No radiopaque stones.  Visualized bones appear intact. Calcified phleboliths in the pelvis.  IMPRESSION: No evidence of active pulmonary disease.  Nonobstructive bowel gas pattern.   Original Report Authenticated By: Lucienne Capers, M.D.    Date: 06/26/2013  Rate: 99  Rhythm: normal sinus rhythm  QRS Axis: normal  Intervals: normal  ST/T Wave abnormalities: normal  Conduction Disutrbances:none  Narrative Interpretation: NSR; no STEMI  Old EKG Reviewed: unchanged from 05/23/13 I have personally reviewed and interpreted this EKG  MDM   1. Suicidal ideations   2. Suicide attempt by drug ingestion, initial encounter    32 year old male with a history of ulcerative colitis, polysubstance abuse, and suicide by drug ingestion presents after attempting suicide at home by taking Reglan, Zyprexa, and prednisone. Patient complaining of abdominal pain which is chronic for him secondary to his ulcerative colitis. Lab significant for drop in hemoglobin from 8.8 to 7.0 over 72 hours. No evidence of acute blood loss, such as hematomas or ecchymosis, on physical exam. CT obtained for further evaluation of anemia as no stool appreciated in the rectal vault for Hemoccult testing. CT scan findings  consistent with inflammatory bowel disease without any other acute abnormalities or evidence of hemorrhage. Patient has remained hemodynamically stable over the last 4 hours. He has no peritoneal signs on physical exam. Patient is medically cleared for further evaluation by ACT team and psychiatry regarding his suicidal ideations. Temp psych hold orders placed.    Antonietta Breach, [place] 06/26/13 [phone]

## 2013-06-26 NOTE — BHH Counselor (Signed)
Writer informed the [facility] Office that the ER MD has submitted a request for a Tele Assessment.

## 2013-06-26 NOTE — Progress Notes (Signed)
Patient ID: [name], male   DOB: Feb 27, 1981, 32 y.o.   MRN: [id number] 32 year old male with a diagnosis of schizophrenia at [facility] who upon arrival reported SI with a plen to cut himself or OD on medication.  He also reported upon arrival to [place].  He informed Kenney Houseman, RN that he was ready to go home.  He informed Probation officer that he was "going through some things" in relation to his health in which he does not care to mention and was also experiencing "personal issues".  He denies SI, HI, or AVH.  He is hard to follow as he reports that he had been feeling the way he did upon admission for weeks then said a month but he also mentioned something in regards to this past Monday.  He continues that he said some of the things that he said upon arrival as he was not "coherent" at that time.  He is calm and cooperative.  Writer informed patient that he needed to be re-evaluated by Psychiatrist in the morning as inpatient stay was recommended.   Patient verbalized understanding and is ok present plan of care, requesting pm medications.

## 2013-06-26 NOTE — ED Notes (Signed)
Patient is requesting to leave nurse explained she will be down to speak with him and he was given a blanket

## 2013-06-26 NOTE — Progress Notes (Signed)
B.Yostin Malacara, MHT was requested by TTS to seek placement for patient who is in need of Inpatient treatment  [place] currently at capacity    [facility] spoke with Sherron Flemings) who reports no availability [place] spoke with Interior and spatial designer) can fax for review  [facility] Hoes spoke with Anderson Malta) who beds available faxed for review [place] spoke with Juliann Pulse) who reports availability faxed for review [place] spoke with Lennette Bihari who reports at [place] spoke with Donnal Debar) who reports expected availability and to fax for review, referral submitted [facility] spoke with Salome Spotted) who reports availability to fax for review [place] spoke with Marye Round) who reports at capacity but can fax for review [place] spoke with [name]) who reports availability faxed for review [place] spoke with Juliann Pulse) reports no availability but can check back later on 06/27/13 with expected discharges [place] spoke with Sharyn Lull) who reports at capacity [place] spoke Gerald Stabs ) at [place] spoke with Nevin Bloodgood) has beds referral faxed for review

## 2013-06-26 NOTE — ED Notes (Signed)
Belongings moved to locker 31.

## 2013-06-26 NOTE — ED Notes (Addendum)
Per EMS patient from home, states he is SI, reports taking 3-4  Pills of Reglan, Xyprexa, and Prednisone. Poison control notified by EMS. C/O abd pain, reports that started prior to taking medications. Patient was ambulatory on scene, denies nausea and dizziness. EMS provided bottles or Reglan and Prednisone.

## 2013-06-26 NOTE — ED Provider Notes (Signed)
Medical screening examination/treatment/procedure(s) were performed by non-physician practitioner and as supervising physician I was immediately available for consultation/collaboration.  Kalman Drape, MD 06/26/13 [phone]

## 2013-06-26 NOTE — ED Notes (Signed)
Patient just went to restroom

## 2013-06-27 NOTE — ED Notes (Signed)
Pt. Awake, alert, oriented and ambulatory without difficulty at discharge.  No acute distress noted.  Pt. Expresses understanding of discharge instruction and follow up.

## 2013-06-27 NOTE — Consult Note (Signed)
  Psychiatric Specialty Exam: @PHYSEXAMBYAGE2 @  @ROS @  Blood pressure 144/82, pulse 72, temperature 98.2 F (36.8 C), temperature source Oral, resp. rate 18, SpO2 100.00%.There is no weight on file to calculate BMI.  General Appearance: Well Groomed  Engineer, water::  Good  Speech:  Clear and Coherent and Normal Rate  Volume:  Normal  Mood:  Euthymic  Affect:  Appropriate  Thought Process:  Negative  Orientation:  Full (Time, Place, and Person)  Thought Content:  Negative  Suicidal Thoughts:  No  Homicidal Thoughts:  No  Memory:  Immediate;   Good Recent;   Good Remote;   Good  Judgement:  Good  Insight:  Fair  Psychomotor Activity:  Normal  Concentration:  Good  Recall:  Good  Akathisia:  Negative  Handed:  Right  AIMS (if indicated):     Assets:  Communication Skills Desire for Improvement Housing Intimacy Leisure Time Resilience Social Support  Sleep:   good   Mr Schurman looks much better today.  Smiling, cooperative, appropriate and asking to go home.  He denies any psychotic thoughts.  He says he is not suicidal and did not take 304 pills.  "I took 5 or 6 pills to try to feel better" he says.  His mother was called and is comfortable having him home.  Discharge today.
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
## 2013-07-02 ENCOUNTER — Ambulatory Visit: Payer: Self-pay | Admitting: Gastroenterology

## 2013-07-02 ENCOUNTER — Telehealth: Payer: Self-pay | Admitting: Gastroenterology

## 2013-07-02 NOTE — Progress Notes (Signed)
Agree with assessment and plan Keenen Roessner A. Ashtynn Berke, M.D. 

## 2013-07-02 NOTE — Telephone Encounter (Signed)
Message copied by Oliva Bustard on Wed Jul 02, 2013  3:56 PM ------      Message from: Barron Alvine      Created: Wed Jul 02, 2013 10:14 AM       Bill pt ------

## 2013-07-06 ENCOUNTER — Emergency Department (HOSPITAL_COMMUNITY)
Admission: EM | Admit: 2013-07-06 | Discharge: 2013-07-08 | Disposition: A | Discharge status: Other Acute Inpatient Hospital | Payer: Medicare Other | Source: Home / Self Care | Attending: Emergency Medicine | Admitting: Emergency Medicine

## 2013-07-06 ENCOUNTER — Encounter (HOSPITAL_COMMUNITY): Payer: Self-pay | Admitting: Emergency Medicine

## 2013-07-06 DIAGNOSIS — R45851 Suicidal ideations: Secondary | ICD-10-CM

## 2013-07-06 LAB — CBC
HCT: 28.3 % — ABNORMAL LOW (ref 39.0–52.0)
Hemoglobin: 8.2 g/dL — ABNORMAL LOW (ref 13.0–17.0)
MCH: 22 pg — ABNORMAL LOW (ref 26.0–34.0)
MCHC: 29 g/dL — ABNORMAL LOW (ref 30.0–36.0)

## 2013-07-06 LAB — RAPID URINE DRUG SCREEN, HOSP PERFORMED: Opiates: NOT DETECTED

## 2013-07-06 LAB — COMPREHENSIVE METABOLIC PANEL
BUN: 10 mg/dL (ref 6–23)
Calcium: 8.5 mg/dL (ref 8.4–10.5)
GFR calc Af Amer: >90 mL/min (ref >90)
Glucose, Bld: 90 mg/dL (ref 70–99)
Sodium: 140 mEq/L (ref 135–145)
Total Protein: 6.2 g/dL (ref 6.0–8.3)

## 2013-07-06 LAB — ETHANOL: Alcohol, Ethyl (B): <11 mg/dL (ref 0–11)

## 2013-07-06 LAB — SALICYLATE LEVEL: Salicylate Lvl: <2 mg/dL — ABNORMAL LOW (ref 2.8–20.0)

## 2013-07-06 MED ORDER — NICOTINE 21 MG/24HR TD PT24
21.0000 mg | MEDICATED_PATCH | Freq: Every day | TRANSDERMAL | Status: DC
  Filled 2013-07-06: qty 1

## 2013-07-06 MED ORDER — LORAZEPAM 1 MG PO TABS
1.0000 mg | ORAL_TABLET | Freq: Three times a day (TID) | ORAL | Status: DC | PRN
  Filled 2013-07-06: qty 1

## 2013-07-06 MED ORDER — ONDANSETRON HCL 4 MG PO TABS: 4.0000 mg | ORAL_TABLET | Freq: Three times a day (TID) | ORAL | Status: DC | PRN

## 2013-07-06 MED ORDER — FERROUS GLUCONATE 216 MG PO TABS: 216.0000 mg | ORAL_TABLET | Freq: Every day | ORAL | Status: DC

## 2013-07-06 MED ORDER — ACETAMINOPHEN 325 MG PO TABS
650.0000 mg | ORAL_TABLET | ORAL | Status: DC | PRN
  Administered 2013-07-08: 650 mg via ORAL
  Filled 2013-07-06: qty 2

## 2013-07-06 MED ORDER — OLANZAPINE 5 MG PO TABS
5.0000 mg | ORAL_TABLET | Freq: Every day | ORAL | Status: DC
  Administered 2013-07-06 – 2013-07-08 (×2): 5 mg via ORAL
  Filled 2013-07-06 (×2): qty 1

## 2013-07-06 MED ORDER — FERROUS GLUCONATE 324 (38 FE) MG PO TABS
324.0000 mg | ORAL_TABLET | Freq: Every day | ORAL | Status: DC
  Administered 2013-07-06 – 2013-07-08 (×2): 324 mg via ORAL
  Filled 2013-07-06 (×4): qty 1

## 2013-07-06 NOTE — ED Provider Notes (Signed)
CSN: [id number]     Arrival date & time 07/06/13  1722 History   First MD Initiated Contact with Patient 07/06/13 1807     Chief Complaint  Patient presents with  . Medical Clearance  . Suicidal   (Consider location/radiation/quality/duration/timing/severity/associated sxs/prior Treatment) Patient is a 32 y.o. male presenting with mental health disorder. The history is provided by the patient. No language interpreter was used.  Mental Health Problem Presenting symptoms: hallucinations and suicidal thoughts   Associated symptoms comment:  He states he is having suicidal thoughts and admits to history of attempt. He is having visual and auditory hallucinations. He has been off his medications for schizophrenia for 4 days and symptoms have worsened. He denies substance abuse issues. He reports chronic lower extremity pain.   Past Medical History  Diagnosis Date  . Ulcerative colitis   . Schizophrenia   . GI (gastrointestinal bleed) 04/2013  . Crohn's disease   . Suicide attempt by drug ingestion    Past Surgical History  Procedure Laterality Date  . Mandible fracture surgery    . Flexible sigmoidoscopy  12/05/2011    Procedure: FLEXIBLE SIGMOIDOSCOPY;  Surgeon: Beryle Beams, MD;  Location: [facility] ENDOSCOPY;  Service: Endoscopy;  Laterality: N/A;  . Esophagogastroduodenoscopy N/A 05/24/2013    Procedure: ESOPHAGOGASTRODUODENOSCOPY (EGD);  Surgeon: Ladene Artist, MD;  Location: [facility] ENDOSCOPY;  Service: Endoscopy;  Laterality: N/A;   Family History  Problem Relation Age of Onset  . Liver cancer     History  Substance Use Topics  . Smoking status: Current Every Day Smoker -- 0.25 packs/day for 20 years    Types: Cigarettes  . Smokeless tobacco: Never Used  . Alcohol Use: Yes     Comment: occasional    Review of Systems  Constitutional: Negative for fever and chills.  Respiratory: Negative.   Cardiovascular: Negative.   Gastrointestinal: Negative.   Musculoskeletal: Positive for  myalgias.  Skin: Negative.   Neurological: Negative.   Psychiatric/Behavioral: Positive for suicidal ideas and hallucinations.    Allergies  Depakote; Lithium; and Nsaids  Home Medications   Current Outpatient Rx  Name  Route  Sig  Dispense  Refill  . ferrous gluconate (FERGON) 216 MG tablet   Oral   Take 216 mg by mouth daily.          . mesalamine (LIALDA) 1.2 G EC tablet   Oral   Take 4.8 g by mouth daily with breakfast.         . OLANZapine (ZYPREXA) 5 MG tablet   Oral   Take 1 tablet (5 mg total) by mouth at bedtime.         . ondansetron (ZOFRAN) 4 MG tablet   Oral   Take 1 tablet (4 mg total) by mouth every 6 (six) hours.   12 tablet   0   . pantoprazole (PROTONIX) 40 MG tablet   Oral   Take 40 mg by mouth 2 (two) times daily.         . potassium chloride (K-DUR) 10 MEQ tablet   Oral   Take 1 tablet (10 mEq total) by mouth 2 (two) times daily.   60 tablet   0   . predniSONE (DELTASONE) 10 MG tablet   Oral   Take 20 mg by mouth daily.          BP 141/79  Pulse 107  Temp(Src) 98.5 F (36.9 C)  Resp 20  SpO2 100% Physical Exam  Constitutional: He is oriented  to person, place, and time. He appears well-developed and well-nourished.  Calm and cooperative.   HENT:  Head: Normocephalic.  Neck: Normal range of motion. Neck supple.  Cardiovascular: Normal rate and regular rhythm.   Pulmonary/Chest: Effort normal and breath sounds normal. He has no wheezes. He has no rales.  Abdominal: Soft. Bowel sounds are normal. There is no tenderness. There is no rebound and no guarding.  Musculoskeletal: Normal range of motion. He exhibits no edema.  Neurological: He is alert and oriented to person, place, and time.  Skin: Skin is warm and dry. No rash noted.  Psychiatric: He has a normal mood and affect.    ED Course  Procedures (including critical care time) Labs Review Labs Reviewed  CBC - Abnormal; Notable for the following:    RBC 3.73 (*)     Hemoglobin 8.2 (*)    HCT 28.3 (*)    MCV 75.9 (*)    MCH 22.0 (*)    MCHC 29.0 (*)    RDW 16.4 (*)    Platelets 526 (*)    All other components within normal limits  COMPREHENSIVE METABOLIC PANEL  ETHANOL  ACETAMINOPHEN LEVEL  SALICYLATE LEVEL  URINE RAPID DRUG SCREEN (HOSP PERFORMED)   Imaging Review No results found.  MDM  No diagnosis found. 1. Suicidal ideation 2. Schizophrenia with acute symptoms  Plan:  Medical clearance given significant medical history, then [facility]/psychiatric evaluation to determine admission.    Dewaine Oats, PA-C 07/06/13 1926

## 2013-07-06 NOTE — ED Provider Notes (Signed)
Medical screening examination/treatment/procedure(s) were performed by non-physician practitioner and as supervising physician I was immediately available for consultation/collaboration.   Blanchard Kelch, MD 07/06/13 2209

## 2013-07-06 NOTE — BH Assessment (Signed)
Tele Assessment Note   [name] is an 32 y.o. male, single, African-American who was brought to [facility] by his mother after Pt took approximately 24 tabs of an unknown medication in a suicide attempt. Pt cannot identify was precipitated this attempt other than to say that he felt suicidal and "I had a flare up of my schizophrenia." Pt reports auditory hallucinations of a voice telling him to kill himself. He also reports seeing people at times who are not really there. He also states he feels paranoid at times. He reports a history of previous suicide attempts by cutting himself but is unable to give any details. He reports he has been feeling depressed with symptoms including crying spells, social isolation, poor concentration, poor memory and feelings of sadness and hopelessness. He states he ran out of his psychiatric medications approximately 4 days ago. He states that he uses powder cocaine approximately once per month and his UDS is positive for cocaine. He also states he occasionally smokes marijuana and has abused pain medications "twice".  Pt cannot identify any particular stressors. He states he lives with his mother and says she is his only support. He is on disability due to mental health problems and is not employed. He denies any current legal problems. Pt states that he has been going to outpatient medication management but cannot remember where. He has been hospitalized in the past at [facility] with his last hospitalization in 2011. He was seen in the emergency department approximately two weeks ago due to psychotic symptoms and suicidal ideation but was not admitted and discharged with outpatient referrals.  Pt his disheveled, drowsy and had difficulty staying awake during assessment. He is oriented x4 with soft speech and normal motor behavior. Thought process is coherent but Pt gave short responses. His mood is depressed and affect is congruent with mood. Pt was cooperative throughout  assessment. He states he is willing to sign himself into a hospital.  Axis I: 295.30 Schizophrenia, Paranoid Type; 305.60 Cocaine Abuse Axis II: Deferred Axis III:  Past Medical History  Diagnosis Date  . Ulcerative colitis   . Schizophrenia   . GI (gastrointestinal bleed) 04/2013  . Crohn's disease   . Suicide attempt by drug ingestion    Axis IV: other psychosocial or environmental problems and problems with primary support group Axis V: GAF=20  Past Medical History:  Past Medical History  Diagnosis Date  . Ulcerative colitis   . Schizophrenia   . GI (gastrointestinal bleed) 04/2013  . Crohn's disease   . Suicide attempt by drug ingestion     Past Surgical History  Procedure Laterality Date  . Mandible fracture surgery    . Flexible sigmoidoscopy  12/05/2011    Procedure: FLEXIBLE SIGMOIDOSCOPY;  Surgeon: Beryle Beams, MD;  Location: [facility] ENDOSCOPY;  Service: Endoscopy;  Laterality: N/A;  . Esophagogastroduodenoscopy N/A 05/24/2013    Procedure: ESOPHAGOGASTRODUODENOSCOPY (EGD);  Surgeon: Ladene Artist, MD;  Location: [facility] ENDOSCOPY;  Service: Endoscopy;  Laterality: N/A;    Family History:  Family History  Problem Relation Age of Onset  . Liver cancer      Social History:  reports that he has been smoking Cigarettes.  He has a 5 pack-year smoking history. He has never used smokeless tobacco. He reports that  drinks alcohol. He reports that he uses illicit drugs (Cocaine and Marijuana).  Additional Social History:  Alcohol / Drug Use Pain Medications: Denies Prescriptions: Denies Over the Counter: Denies History of alcohol / drug  use?: Yes Longest period of sobriety (when/how long): unknown Withdrawal Symptoms:  (P denies) Substance #1 Name of Substance 1: Cocaine (powder) 1 - Age of First Use: 19 1 - Amount (size/oz): $200 worth 1 - Frequency: once per month 1 - Duration: Several years 1 - Last Use / Amount: 07/05/13 Substance #2 Name of Substance 2:  Marijuana 2 - Age of First Use: adolescent 2 - Amount (size/oz): varies 2 - Frequency: varies 2 - Duration: several years 2 - Last Use / Amount: unknown  CIWA: CIWA-Ar BP: 141/79 mmHg Pulse Rate: 107 COWS:    Allergies:  Allergies  Allergen Reactions  . Depakote [Divalproex Sodium] Other (See Comments)    Reaction: seizures   . Lithium Other (See Comments)    Reaction: rectal bleeding  . Nsaids Other (See Comments)    Reaction: rectal bleeding    Home Medications:  (Not in a hospital admission)  OB/GYN Status:  No LMP for male patient.  General Assessment Data Location of Assessment: [facility] ED Is this a Tele or Face-to-Face Assessment?: Tele Assessment Is this an Initial Assessment or a Re-assessment for this encounter?: Initial Assessment Living Arrangements: Parent (Mother) Can pt return to current living arrangement?: Yes Admission Status: Voluntary Is patient capable of signing voluntary admission?: Yes Transfer from: Home Referral Source: Self/Family/Friend     [place] Living Arrangements: Parent (Mother) Name of Psychiatrist: "I can't remember" Name of Therapist: None  Education Status Is patient currently in school?: No Current Grade: NA Highest grade of school patient has completed: NA Name of school: NA Contact person: NA  Risk to self Suicidal Ideation: Yes-Currently Present Suicidal Intent: Yes-Currently Present Is patient at risk for suicide?: Yes Suicidal Plan?: Yes-Currently Present Specify Current Suicidal Plan: Pt overdosed today in suicide attempt Access to Means: Yes Specify Access to Suicidal Means: Access to various medications What has been your use of drugs/alcohol within the last 12 months?: Pt report abusing cocaine and marijuana Previous Attempts/Gestures: Yes How many times?:  (unknown) Other Self Harm Risks: None Triggers for Past Attempts: Hallucinations Intentional Self Injurious Behavior: Cutting Comment - Self  Injurious Behavior: Pt has a history of intentional cutting Family Suicide History: No Recent stressful life event(s): Other (Comment) (Hallucinations) Persecutory voices/beliefs?: Yes Depression: Yes Depression Symptoms: Despondent;Tearfulness;Feeling worthless/self pity;Loss of interest in usual pleasures Substance abuse history and/or treatment for substance abuse?: Yes Suicide prevention information given to non-admitted patients: Not applicable  Risk to Others Homicidal Ideation: No Thoughts of Harm to Others: No Current Homicidal Intent: No Current Homicidal Plan: No Access to Homicidal Means: No Identified Victim: None History of harm to others?: No Assessment of Violence: None Noted Violent Behavior Description: Pt denies history of violence Does patient have access to weapons?: No Criminal Charges Pending?: No Does patient have a court date: No  Psychosis Hallucinations: Auditory;Visual (Command hallucinations to kill self) Delusions: Persecutory (Pt reports paranoia)  Mental Status Report Appear/Hygiene: Disheveled Eye Contact: Poor Motor Activity: Unremarkable Speech: Soft;Slow Level of Consciousness: Drowsy Mood: Depressed Affect: Blunted Anxiety Level: None Thought Processes: Coherent Judgement: Unimpaired Orientation: Person;Place;Time;Situation Obsessive Compulsive Thoughts/Behaviors: None  Cognitive Functioning Concentration: Decreased Memory: Recent Impaired;Remote Impaired IQ: Average Insight: Poor Impulse Control: Poor Appetite: Good Weight Loss: 0 Weight Gain: 0 Sleep: No Change Total Hours of Sleep: 8 Vegetative Symptoms: Decreased grooming  ADLScreening [facility]) Patient's cognitive ability adequate to safely complete daily activities?: Yes Patient able to express need for assistance with ADLs?: Yes Independently performs ADLs?: Yes (  appropriate for developmental age)  Prior Inpatient Therapy Prior Inpatient Therapy:  Yes Prior Therapy Dates: 2011 Prior Therapy Facilty/Provider(s): [facility] Reason for Treatment: Psychosis  Prior Outpatient Therapy Prior Outpatient Therapy: Yes Prior Therapy Dates: Ongoing Prior Therapy Facilty/Provider(s): Pt cannot remember Reason for Treatment: medicatoin management and outpatient therapy  ADL Screening (condition at time of admission) Patient's cognitive ability adequate to safely complete daily activities?: Yes Is the patient deaf or have difficulty hearing?: No Does the patient have difficulty seeing, even when wearing glasses/contacts?: No Does the patient have difficulty concentrating, remembering, or making decisions?: No Patient able to express need for assistance with ADLs?: Yes Does the patient have difficulty dressing or bathing?: No Independently performs ADLs?: Yes (appropriate for developmental age) Does the patient have difficulty walking or climbing stairs?: No Weakness of Legs: None Weakness of Arms/Hands: None  Home Assistive Devices/Equipment Home Assistive Devices/Equipment: None    Abuse/Neglect Assessment (Assessment to be complete while patient is alone) Physical Abuse: Denies Verbal Abuse: Denies Sexual Abuse: Denies Exploitation of patient/patient's resources: Denies Values / Beliefs Cultural Requests During Hospitalization: None Spiritual Requests During Hospitalization: None   Advance Directives (For Healthcare) Advance Directive: Patient does not have advance directive;Patient would not like information Pre-existing out of facility DNR order (yellow form or pink MOST form): No Nutrition Screen- [facility] Adult/[facility]/AP Patient's home diet: Regular  Additional Information 1:1 In Past 12 Months?: No CIRT Risk: No Elopement Risk: No Does patient have medical clearance?: Yes     Disposition:  Disposition Initial Assessment Completed for this Encounter: Yes Disposition of Patient: Inpatient treatment program Type of inpatient  treatment program: Adult  Pt meets criteria for inpatient psychiatric crisis stabilization. Consulted with Okey Regal, [facility] who confirmed [facility] is at capacity. There are no other appropriate facilities with beds available at this time. Pt will be considered for admission to [facility] when a bed become available. Placement will continue to be sought at other facilities until he is placed.  Evelena Peat, [facility], [facility] Triage Specialist   Devoria Glassing Montez Morita 07/06/2013 10:55 PM

## 2013-07-06 NOTE — ED Provider Notes (Signed)
Laterality is have been reviewed.  Patient is anemic, but improved since last lab draw.  He is medically cleared for psychiatric evaluation.  Will be moved to Mitzie Na, NP 07/06/13 2125

## 2013-07-06 NOTE — ED Notes (Signed)
Pt here for SI with plan to OD on meds; pt sts took 24 unknown pills today at 1pm; pt sts auditory and visual hallucinations; pt sts off psych meds x 4 days

## 2013-07-07 MED ORDER — ZIPRASIDONE MESYLATE 20 MG IM SOLR
20.0000 mg | Freq: Once | INTRAMUSCULAR | Status: DC
  Filled 2013-07-07: qty 20

## 2013-07-07 NOTE — ED Notes (Signed)
Pt getting very agitated and verbally abusive to mother on phone as well as staff.  Asked Dr. Regenia Skeeter for IM Geodon order because of aggressive behavior and called security.  Pt returned to room and calmed down by security to manageable level without incident.

## 2013-07-07 NOTE — BH Assessment (Signed)
Contacted the following facilities for placement:  [facility]: At capacity [facility]: At [place]: At [facility]: At [place]: At [place]: At [place]: At [place]: At [facility]: At [place], [facility], [facility] Triage Specialist

## 2013-07-07 NOTE — BHH Counselor (Signed)
Writer spoke to the intake person at [facility]).  [name] reports that patient is being reviewed by their doctors.  Writer provided [place] with the phone number to [place] at [facility] so that they would be able to answer the medical questions about the patient.

## 2013-07-07 NOTE — ED Notes (Signed)
Mother --Bejamin Hackbart -- called -- is patient's medical POA -- phone number [phone], states pt had been "talking to people that are not there-- hearing voices-- but has been denying it" Also has community ACT team

## 2013-07-07 NOTE — ED Notes (Signed)
Pt was on the phone using foul, loud language stating he wanted to get out of here. Went back to room and then a few minutes later pt walked out of his room and this nurse asked him where he was going and the patient stated 'Where you think I'm going? To the bathroom" and proceeded to curse at this RN. When he was done in the bathroom he walked over to the nurses station and demanded new scrubs stating 'You better give me new scrubs" and demanding to get a new nurse. Pt using foul language and cursing at the nurse and sitter. Security called and spoke with patient, pt de-escalated. Pt is now sitting at bedside, calm.

## 2013-07-07 NOTE — ED Notes (Addendum)
Pt asking questions about going home; explained to pt that he couldn't go because he had SI and it wasn't safe for him to go home; Dr. Regenia Skeeter notified of increasing pt agitation. Pt refused to take Ativan.

## 2013-07-07 NOTE — ED Notes (Signed)
Pt resting quietly. Sitter has pt within eyesight.

## 2013-07-08 ENCOUNTER — Encounter (HOSPITAL_COMMUNITY): Payer: Self-pay | Admitting: Behavioral Health

## 2013-07-08 ENCOUNTER — Inpatient Hospital Stay (HOSPITAL_COMMUNITY)
Admission: AD | Admit: 2013-07-08 | Discharge: 2013-07-11 | DRG: 885 | Disposition: A | Discharge status: Home / Self Care | Payer: Medicare Other | Source: Intra-hospital | Attending: Psychiatry | Admitting: Psychiatry

## 2013-07-08 DIAGNOSIS — K5190 Ulcerative colitis, unspecified, without complications: Secondary | ICD-10-CM | POA: Diagnosis present

## 2013-07-08 DIAGNOSIS — F1910 Other psychoactive substance abuse, uncomplicated: Secondary | ICD-10-CM | POA: Diagnosis present

## 2013-07-08 DIAGNOSIS — F1410 Cocaine abuse, uncomplicated: Secondary | ICD-10-CM | POA: Diagnosis present

## 2013-07-08 DIAGNOSIS — F209 Schizophrenia, unspecified: Secondary | ICD-10-CM

## 2013-07-08 DIAGNOSIS — Z79899 Other long term (current) drug therapy: Secondary | ICD-10-CM

## 2013-07-08 DIAGNOSIS — F200 Paranoid schizophrenia: Secondary | ICD-10-CM | POA: Diagnosis present

## 2013-07-08 MED ORDER — FERROUS GLUCONATE 324 (38 FE) MG PO TABS
324.0000 mg | ORAL_TABLET | Freq: Every day | ORAL | Status: DC
  Administered 2013-07-09 – 2013-07-11 (×3): 324 mg via ORAL
  Filled 2013-07-08 (×5): qty 1

## 2013-07-08 MED ORDER — OLANZAPINE 5 MG PO TBDP
5.0000 mg | ORAL_TABLET | Freq: Every day | ORAL | Status: DC
  Administered 2013-07-08: 5 mg via ORAL
  Filled 2013-07-08 (×4): qty 1

## 2013-07-08 MED ORDER — ACETAMINOPHEN 325 MG PO TABS: 650.0000 mg | ORAL_TABLET | Freq: Four times a day (QID) | ORAL | Status: DC | PRN

## 2013-07-08 MED ORDER — HYDROXYZINE HCL 50 MG PO TABS: 50.0000 mg | ORAL_TABLET | Freq: Three times a day (TID) | ORAL | Status: DC | PRN

## 2013-07-08 MED ORDER — ALUM & MAG HYDROXIDE-SIMETH 200-200-20 MG/5ML PO SUSP: 30.0000 mL | ORAL | Status: DC | PRN

## 2013-07-08 MED ORDER — MAGNESIUM HYDROXIDE 400 MG/5ML PO SUSP: 30.0000 mL | Freq: Every day | ORAL | Status: DC | PRN

## 2013-07-08 MED ORDER — NICOTINE 21 MG/24HR TD PT24
21.0000 mg | MEDICATED_PATCH | Freq: Every day | TRANSDERMAL | Status: DC
  Filled 2013-07-08 (×3): qty 1

## 2013-07-08 NOTE — Progress Notes (Signed)
Pt has been napping since coming on the unit this afternoon.  Pt reports he feels fine.  He denies SI/HI/AV.  When asked if he had any questions or concerns, pt wanted to know when he could leave.  Informed pt that he would need to discuss that concern with the MD in the morning.  Pt requested ginger ale which was given along with his scheduled Zyprexa 5 mg.  Pt was encouraged to make his needs known to staff.  Support and encouragement offered.  Safety maintained with q15 minute checks.

## 2013-07-08 NOTE — Progress Notes (Signed)
Admission note: Pt denies SI/HI/AVH at this time. Pt stated that he was feeling depressed d/t financial problems not  long ago. Pt stated that he is now feeling better because he came up with a way to pay his bills. When writer asked pt how or if he is working, pt stated "no" "you don't need to know all that". Pt then stated are you done. Pt denies hx of SI. Pt reports smoking cigarettes and "weed". Pt UDS pos for cocaine. Pt guarded. Forwards little information. Poor insight.   Pt explained policy here at [facility]. Skin assessed. V/s taken.

## 2013-07-08 NOTE — ED Notes (Signed)
SPOKE WITH BH. THEY ADVISE THE NP WILL BE AVAILABLE AROUND 230 TO [facility]EEVAL PT

## 2013-07-08 NOTE — Progress Notes (Signed)
Adult Psychoeducational Group Note  Date:  07/08/2013 Time:  9:48 PM  Group Topic/Focus:  Wrap-Up Group:   The focus of this group is to help patients review their daily goal of treatment and discuss progress on daily workbooks.  Participation Level:  Active  Participation Quality:  Appropriate  Affect:  Flat  Cognitive:  Appropriate  Insight: Appropriate  Engagement in Group:  Engaged  Modes of Intervention:  Support  Additional Comments:  Patient attended and participated in group tonight. He reports having a good day. He watch television and meet some new people.  Salley Scarlet [facility] 07/08/2013, 9:48 PM

## 2013-07-08 NOTE — ED Notes (Signed)
Spoke with ava at [facility]h. Made her aware pt would like a reeval. States he is not hearing voices and is not suicidal. States he would like to go home

## 2013-07-08 NOTE — Consult Note (Signed)
Telepsych Consultation   Reason for Consult:  Depression with suicide attempt, Psychosis Referring Physician: Dr. Dyanne Iha is an 32 y.o. male.  Assessment: AXIS I:  Paranoid Schizophrenia AXIS II:  Deferred AXIS III:   Past Medical History  Diagnosis Date  . Ulcerative colitis   . Schizophrenia   . GI (gastrointestinal bleed) 04/2013  . Crohn's disease   . Suicide attempt by drug ingestion    AXIS IV:  economic problems, occupational problems and other psychosocial or environmental problems AXIS V:  41-50 serious symptoms  Plan:  Recommend psychiatric Inpatient admission when medically cleared.  Subjective:   [name] is a 32 y.o. male patient admitted with hallucinations and suicidal thoughts. The patient had been off his medications for several days which resulted in a worsening of his symptoms.   HPI:  [name] is a 32 year old male who was came to the [name] voluntarily he reports for help with depression. He admits to not having been taken his psychiatric medication. Patient does not want to discuss being recently in the ED earlier this month stating "That is in the past." He disputes information in the chart such as that he may have used cocaine or that he became agitated with staff in the ED yesterday. The patient became irritable when more questions were asked by writer about his reasons for being in the hospital to which patient replied "I have financial problems. I'm depressed about it. Can you help me sort it out?" The patient denies any psychiatric symptoms but appears to desire discharge home.  HPI Elements:   Location:  [facility] ED. Quality:  Progressive worsening of symptoms. Severity:  Resulted in a suicide attempt. Timing:  Over the last week. . Duration:  Patient has history of mental illness. . Context:  Medication noncompliance, cocaine use.  Past Psychiatric History: Past Medical History  Diagnosis Date  . Ulcerative colitis   . Schizophrenia   .  GI (gastrointestinal bleed) 04/2013  . Crohn's disease   . Suicide attempt by drug ingestion     reports that he has been smoking Cigarettes.  He has a 5 pack-year smoking history. He has never used smokeless tobacco. He reports that  drinks alcohol. He reports that he uses illicit drugs (Cocaine and Marijuana). Family History  Problem Relation Age of Onset  . Liver cancer     Family History Substance Abuse: No Family Supports: Yes, List: (Mother) Living Arrangements: Parent (Mother) Can pt return to current living arrangement?: Yes Allergies:   Allergies  Allergen Reactions  . Depakote [Divalproex Sodium] Other (See Comments)    Reaction: seizures   . Lithium Other (See Comments)    Reaction: rectal bleeding  . Nsaids Other (See Comments)    Reaction: rectal bleeding    ACT Assessment Complete:  Yes:    Educational Status    Risk to Self: Risk to self Suicidal Ideation: Yes-Currently Present Suicidal Intent: Yes-Currently Present Is patient at risk for suicide?: Yes Suicidal Plan?: Yes-Currently Present Specify Current Suicidal Plan: Pt overdosed today in suicide attempt Access to Means: Yes Specify Access to Suicidal Means: Access to various medications What has been your use of drugs/alcohol within the last 12 months?: Pt report abusing cocaine and marijuana Previous Attempts/Gestures: Yes How many times?:  (unknown) Other Self Harm Risks: None Triggers for Past Attempts: Hallucinations Intentional Self Injurious Behavior: Cutting Comment - Self Injurious Behavior: Pt has a history of intentional cutting Family Suicide History: No Recent stressful life  event(s): Other (Comment) (Hallucinations) Persecutory voices/beliefs?: Yes Depression: Yes Depression Symptoms: Despondent;Tearfulness;Feeling worthless/self pity;Loss of interest in usual pleasures Substance abuse history and/or treatment for substance abuse?: Yes Suicide prevention information given to non-admitted  patients: Not applicable  Risk to Others: Risk to Others Homicidal Ideation: No Thoughts of Harm to Others: No Current Homicidal Intent: No Current Homicidal Plan: No Access to Homicidal Means: No Identified Victim: None History of harm to others?: No Assessment of Violence: None Noted Violent Behavior Description: Pt denies history of violence Does patient have access to weapons?: No Criminal Charges Pending?: No Does patient have a court date: No  Abuse: Abuse/Neglect Assessment (Assessment to be complete while patient is alone) Physical Abuse: Denies Verbal Abuse: Denies Sexual Abuse: Denies Exploitation of patient/patient's resources: Denies  Prior Inpatient Therapy: Prior Inpatient Therapy Prior Inpatient Therapy: Yes Prior Therapy Dates: 2011 Prior Therapy Facilty/Provider(s): [facility] Reason for Treatment: Psychosis  Prior Outpatient Therapy: Prior Outpatient Therapy Prior Outpatient Therapy: Yes Prior Therapy Dates: Ongoing Prior Therapy Facilty/Provider(s): Pt cannot remember Reason for Treatment: medicatoin management and outpatient therapy  Additional Information: Additional Information 1:1 In Past 12 Months?: No CIRT Risk: No Elopement Risk: No Does patient have medical clearance?: Yes                  Objective: Blood pressure 115/65, pulse 76, temperature 98 F (36.7 C), temperature source Oral, resp. rate 16, SpO2 99.00%.There is no weight on file to calculate BMI. Results for orders placed during the hospital encounter of 07/06/13 (from the past 72 hour(s))  CBC     Status: Abnormal   Collection Time    07/06/13  5:31 PM      Result Value Range   WBC 6.3  4.0 - 10.5 K/uL   RBC 3.73 (*) 4.22 - 5.81 MIL/uL   Hemoglobin 8.2 (*) 13.0 - 17.0 g/dL   HCT 28.3 (*) 39.0 - 52.0 %   MCV 75.9 (*) 78.0 - 100.0 fL   MCH 22.0 (*) 26.0 - 34.0 pg   MCHC 29.0 (*) 30.0 - 36.0 g/dL   RDW 16.4 (*) 11.5 - 15.5 %   Platelets 526 (*) 150 - 400 K/uL   COMPREHENSIVE METABOLIC PANEL     Status: Abnormal   Collection Time    07/06/13  5:31 PM      Result Value Range   Sodium 140  135 - 145 mEq/L   Potassium 3.4 (*) 3.5 - 5.1 mEq/L   Chloride 104  96 - 112 mEq/L   CO2 27  19 - 32 mEq/L   Glucose, Bld 90  70 - 99 mg/dL   BUN 10  6 - 23 mg/dL   Creatinine, Ser 0.99  0.50 - 1.35 mg/dL   Calcium 8.5  8.4 - 10.5 mg/dL   Total Protein 6.2  6.0 - 8.3 g/dL   Albumin 3.0 (*) 3.5 - 5.2 g/dL   AST 13  0 - 37 U/L   ALT 7  0 - 53 U/L   Alkaline Phosphatase 55  39 - 117 U/L   Total Bilirubin 0.2 (*) 0.3 - 1.2 mg/dL   GFR calc non Af Amer >90  >90 mL/min   GFR calc Af Amer >90  >90 mL/min   Comment: (NOTE)     The eGFR has been calculated using the CKD EPI equation.     This calculation has not been validated in all clinical situations.     eGFR's persistently <90 mL/min signify possible  Chronic Kidney     Disease.  ETHANOL     Status: None   Collection Time    07/06/13  5:31 PM      Result Value Range   Alcohol, Ethyl (B) <11  0 - 11 mg/dL   Comment:            LOWEST DETECTABLE LIMIT FOR     SERUM ALCOHOL IS 11 mg/dL     FOR MEDICAL PURPOSES ONLY  ACETAMINOPHEN LEVEL     Status: None   Collection Time    07/06/13  5:31 PM      Result Value Range   Acetaminophen (Tylenol), Serum <15.0  10 - 30 ug/mL   Comment:            THERAPEUTIC CONCENTRATIONS VARY     SIGNIFICANTLY. A RANGE OF 10-30     ug/mL MAY BE AN EFFECTIVE     CONCENTRATION FOR MANY PATIENTS.     HOWEVER, SOME ARE BEST TREATED     AT CONCENTRATIONS OUTSIDE THIS     RANGE.     ACETAMINOPHEN CONCENTRATIONS     >150 ug/mL AT 4 HOURS AFTER     INGESTION AND >50 ug/mL AT 12     HOURS AFTER INGESTION ARE     OFTEN ASSOCIATED WITH TOXIC     REACTIONS.  SALICYLATE LEVEL     Status: Abnormal   Collection Time    07/06/13  5:31 PM      Result Value Range   Salicylate Lvl <3.0 (*) 2.8 - 20.0 mg/dL  URINE RAPID DRUG SCREEN (HOSP PERFORMED)     Status: Abnormal    Collection Time    07/06/13  7:09 PM      Result Value Range   Opiates NONE DETECTED  NONE DETECTED   Cocaine POSITIVE (*) NONE DETECTED   Benzodiazepines NONE DETECTED  NONE DETECTED   Amphetamines NONE DETECTED  NONE DETECTED   Tetrahydrocannabinol NONE DETECTED  NONE DETECTED   Barbiturates NONE DETECTED  NONE DETECTED   Comment:            DRUG SCREEN FOR MEDICAL PURPOSES     ONLY.  IF CONFIRMATION IS NEEDED     FOR ANY PURPOSE, NOTIFY LAB     WITHIN 5 DAYS.                LOWEST DETECTABLE LIMITS     FOR URINE DRUG SCREEN     Drug Class       Cutoff (ng/mL)     Amphetamine      1000     Barbiturate      200     Benzodiazepine   076     Tricyclics       226     Opiates          300     Cocaine          300     THC              50   Labs are reviewed and are pertinent for unspecified anemia and urine positive for cocaine.  Current Facility-Administered Medications  Medication Dose Route Frequency Provider Last Rate Last Dose  . acetaminophen (TYLENOL) tablet 650 mg  650 mg Oral Q4H PRN Garald Balding, NP   650 mg at 07/08/13 0701  . ferrous gluconate (FERGON) tablet 324 mg  324 mg Oral Q breakfast Garald Balding, NP   324  mg at 07/08/13 0842  . LORazepam (ATIVAN) tablet 1 mg  1 mg Oral Q8H PRN Garald Balding, NP      . OLANZapine [facility]) tablet 5 mg  5 mg Oral QHS Garald Balding, NP   5 mg at 07/08/13 0325  . ondansetron (ZOFRAN) tablet 4 mg  4 mg Oral Q8H PRN Garald Balding, NP      . ziprasidone (GEODON) injection 20 mg  20 mg Intramuscular Once Ephraim Hamburger, MD       Current Outpatient Prescriptions  Medication Sig Dispense Refill  . ferrous gluconate (FERGON) 216 MG tablet Take 216 mg by mouth daily.       . mesalamine (LIALDA) 1.2 G EC tablet Take 4.8 g by mouth daily with breakfast.      . OLANZapine (ZYPREXA) 5 MG tablet Take 1 tablet (5 mg total) by mouth at bedtime.      . ondansetron (ZOFRAN) 4 MG tablet Take 1 tablet (4 mg total) by mouth every 6 (six) hours.   12 tablet  0  . pantoprazole (PROTONIX) 40 MG tablet Take 40 mg by mouth 2 (two) times daily.      . potassium chloride (K-DUR) 10 MEQ tablet Take 1 tablet (10 mEq total) by mouth 2 (two) times daily.  60 tablet  0  . predniSONE (DELTASONE) 10 MG tablet Take 20 mg by mouth daily.       Facility-Administered Medications Ordered in Other Encounters  Medication Dose Route Frequency Provider Last Rate Last Dose  . hydrOXYzine (ATARAX/VISTARIL) tablet 50 mg  50 mg Oral TID PRN Encarnacion Slates, NP      . magnesium hydroxide (MILK OF MAGNESIA) suspension 30 mL  30 mL Oral Daily PRN Encarnacion Slates, NP        Psychiatric Specialty Exam:     Blood pressure 115/65, pulse 76, temperature 98 F (36.7 C), temperature source Oral, resp. rate 16, SpO2 99.00%.There is no weight on file to calculate BMI.  General Appearance: Disheveled  Eye Contact::  Good  Speech:  Clear and Coherent  Volume:  Normal  Mood:  Anxious and Irritable  Affect:  Blunt  Thought Process:  Negative  Orientation:  Full (Time, Place, and Person)  Thought Content:  Negative  Suicidal Thoughts:  No  Homicidal Thoughts:  No  Memory:  Immediate;   Fair Recent;   Fair Remote;   Fair  Judgement:  Impaired  Insight:  Fair  Psychomotor Activity:  Normal  Concentration:  Fair  Recall:  Fair  Akathisia:  Negative  Handed:  Right  AIMS (if indicated):     Assets:  Communication Skills Desire for Improvement Housing Intimacy Leisure Time Resilience  Sleep:      Treatment Plan Summary: The patient is meeting criteria for inpatient admission. He appears to have worsening of his symptoms over the last few weeks with an ED visit on 06/27/13 after overdosing on medication and was sent home. The patient represents with another overdose and appears to be minimizing his symptoms. His mother has expressed concern that the patient has been hearing voices but patient is denying this. Reviewed the case with Dr. Dwyane Dee who suggests patient be  admitted to the [facility] hall for further management and evaluation.   Disposition: Disposition Initial Assessment Completed for this Encounter: Yes Disposition of Patient: Inpatient treatment program Type of inpatient treatment program: Adult  Elmarie Shiley  NP-C  07/08/2013 3:21 PM

## 2013-07-08 NOTE — [facility] Counselor (Signed)
Per, Eustaquio Maize Old Vertis Kelch has denied the patient.

## 2013-07-09 DIAGNOSIS — F1410 Cocaine abuse, uncomplicated: Secondary | ICD-10-CM

## 2013-07-09 DIAGNOSIS — F209 Schizophrenia, unspecified: Principal | ICD-10-CM

## 2013-07-09 MED ORDER — OLANZAPINE 10 MG PO TBDP: 10.0000 mg | ORAL_TABLET | Freq: Three times a day (TID) | ORAL | Status: DC | PRN

## 2013-07-09 MED ORDER — TRAZODONE HCL 50 MG PO TABS: 50.0000 mg | ORAL_TABLET | Freq: Every evening | ORAL | Status: DC | PRN

## 2013-07-09 MED ORDER — OLANZAPINE 10 MG PO TBDP
10.0000 mg | ORAL_TABLET | Freq: Every day | ORAL | Status: DC
  Administered 2013-07-09 – 2013-07-10 (×2): 10 mg via ORAL
  Filled 2013-07-09 (×4): qty 1

## 2013-07-09 NOTE — [facility] Suicide Risk Assessment (Signed)
Suicide Risk Assessment  Admission Assessment     Nursing information obtained from:  Patient Demographic factors:  Male;Low socioeconomic status;Unemployed Current Mental Status:  Self-harm behaviors Loss Factors:  Financial problems / change in socioeconomic status Historical Factors:  Prior suicide attempts Risk Reduction Factors:  Sense of responsibility to family  CLINICAL FACTORS:   Severe Anxiety and/or Agitation Depression:   Aggression Anhedonia Comorbid alcohol abuse/dependence Hopelessness Impulsivity Insomnia Alcohol/Substance Abuse/Dependencies Schizophrenia:   Paranoid or undifferentiated type Currently Psychotic  COGNITIVE FEATURES THAT CONTRIBUTE TO RISK:  Closed-mindedness Polarized thinking    SUICIDE RISK:   Minimal: No identifiable suicidal ideation.  Patients presenting with no risk factors but with morbid ruminations; may be classified as minimal risk based on the severity of the depressive symptoms  PLAN OF CARE:1. Admit for crisis management and stabilization. 2. Medication management to reduce current symptoms to base line and improve the     patient's overall level of functioning 3. Treat health problems as indicated. 4. Develop treatment plan to decrease risk of relapse upon discharge and the need for     readmission. 5. Psycho-social education regarding relapse prevention and self care. 6. Health care follow up as needed for medical problems. 7. Restart home medications where appropriate.   I certify that inpatient services furnished can reasonably be expected to improve the patient's condition.  Corena Pilgrim, MD 07/09/2013, 10:41 AM

## 2013-07-09 NOTE — Progress Notes (Signed)
Patient ID: [name], male   DOB: 12-26-1980, 32 y.o.   MRN: [id number] D:Patient is pleasant with a slightly irritable edge.  He is focused only on discharge. He met with doctor and was disappointed that he would have to stay.  Patient became irritable stating, "I was in ED for 4 days and I'm fine."  He told nurse this morning when asked about SI: "all that is over with.  I'm fine."  Patient minimizes his drug use as well as his mental health.  Patient has poor insight. A:Continue to monitor medication management and MD orders.  Safety checks completed every 15 minutes per protocol. R:Patient's behavior is appropriate.
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
## 2013-07-09 NOTE — Tx Team (Signed)
  Interdisciplinary Treatment Plan Update   Date Reviewed:  07/09/2013  Time Reviewed:  8:01 AM  Progress in Treatment:   Attending groups: Yes Participating in groups: Yes Taking medication as prescribed: Yes  Tolerating medication: Yes Family/Significant other contact made:   Patient understands diagnosis: Yes  States he was hearing voices and depressed when in the ED, but I am OK now Discussing patient identified problems/goals with staff: Yes  See initial plan Medical problems stabilized or resolved: Yes Denies suicidal/homicidal ideation: Yes  In tx team Patient has not harmed self or others: Yes  For review of initial/current patient goals, please see plan of care.  Estimated Length of Stay:  3-5 days  Reason for Continuation of Hospitalization: Depression Hallucinations Medication stabilization  New Problems/Goals identified:  N/A  Discharge Plan or Barriers:   return home, follow up outpt  Additional Comments:   Admission note: Pt denies SI/HI/AVH at this time. Pt stated that he was feeling depressed d/t financial problems not long ago. Pt stated that he is now feeling better because he came up with a way to pay his bills. When writer asked pt how or if he is working, pt stated "no" "you don't need to know all that". Pt then stated are you done. Pt denies hx of SI. Pt reports smoking cigarettes and "weed". Pt UDS pos for cocaine. Pt guarded. Forwards little information. Poor insight.      Attendees:  Signature: Corena Pilgrim, MD 07/09/2013 8:01 AM   Signature: Ripley Fraise, LCSW 07/09/2013 8:01 AM  Signature: Elmarie Shiley, NP 07/09/2013 8:01 AM  Signature: Mayra Neer, RN 07/09/2013 8:01 AM  Signature: Darrol Angel, RN 07/09/2013 8:01 AM  Signature:  07/09/2013 8:01 AM  Signature:   07/09/2013 8:01 AM  Signature:    Signature:    Signature:    Signature:    Signature:    Signature:      Scribe for Treatment Team:   Ripley Fraise, LCSW  07/09/2013 8:01 AM
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
## 2013-07-09 NOTE — Progress Notes (Signed)
The focus of this group is to help patients review their daily goal of treatment and discuss progress on daily workbooks. Pt attended the evening group session and responded to discussion prompts from the [facility]. Pt reported having a good day today because "I was able to meet the doctor in the television today! First I saw her on television talking to me and now she is here in person!" Pt reported having no additional needs from Nursing Staff this evening. The Writer did observe the Pt smiling and laughing inappropriately in the group. Pt was pleasant when being directly engaged but distracted while others were speaking.

## 2013-07-09 NOTE — Progress Notes (Signed)
Pt was in bed when shift started, but is now out of his room.  Pt states he is fine and wants to go home.  Pt says his thoughts are clear and he is not having any thoughts to harm himself.  He laughs inappropriately/nervously as he talks with Probation officer.  Pt voices his needs and says he has no needs or concerns at this time.  He denies HI/AV.   He says as long as he takes his medicines, he is fine.  Support and encouragement offered.  Encouraged pt to discuss his concerns about discharge with the MD tomorrow as he is the one that determines discharge dates.  Safety maintained with q15 minute checks.

## 2013-07-09 NOTE — [facility] Group Notes (Signed)
[facility] LCSW Aftercare Discharge Planning Group Note   07/09/2013 7:59 AM  Participation Quality:  Engaged  Mood/Affect:  Flat  Depression Rating:  denies  Anxiety Rating:  denies  Thoughts of Suicide:  No Will you contract for safety?   NA  Current AVH:  No  Plan for Discharge/Comments:  States he wa in the ED due to "financial depression and hearing voices."  These things have both been resolved since his admission there on Sunday, and he is ready to be d/ced.  Explained the process and told him he would not be going today.  He follows up at [facility] and has been living with his mother for a year.  Michela Pitcher he was somewhere for 19 mos prior to moving back in with mother, but declined to elaborate.  Transportation Means:  bus  Supports: mother  Maya Scholer [place], Ernestine Mcmurray

## 2013-07-09 NOTE — Progress Notes (Signed)
Recreation Therapy Notes  Date: 09.24.2014 Time: 9:30am Location: [address] Dayroom  Group Topic: Coping Skills  Goal Area(s) Addresses:  Patient will be able to identify coping skills. Patient will work effectively with group members.   Behavioral Response: Did not attend.  Laureen Ochs Juliani Laduke, LRT/CTRS  Lane Hacker 07/09/2013 4:34 PM

## 2013-07-09 NOTE — Care Management Utilization Note (Signed)
Per State Regulation 482.30  The chart was reviewed for necessity with respect to the patient's Admission/ Duration of stay. Admission 07/08/2013  Next Review ILNZ:9/72/82  Conception Oms, RN, BSN
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
## 2013-07-10 NOTE — Progress Notes (Signed)
[facility] Adult Case Management Discharge Plan :  Will you be returning to the same living situation after discharge: Yes,  home with mother At discharge, do you have transportation home?:Yes,  bus pass Do you have the ability to pay for your medications:Yes,  MCD  Release of information consent forms completed and in the chart;  Patient's signature needed at discharge.  Patient to Follow up at: Follow-up Information   Follow up with [facility]. (Go to the walk-in clinic M-F between 8 and 9AM for your hospital follow up appointment.)    Contact information:   [place] [phone]      Patient denies SI/HI:   Yes,  yes    Safety Planning and Suicide Prevention discussed:  Yes,  yes  Trish Mage 07/10/2013, 2:57 PM

## 2013-07-10 NOTE — [facility] Group Notes (Signed)
[facility] Group Notes:  (Counselor/Nursing/MHT/Case Management/Adjunct)  07/10/2013 1:15PM  Type of Therapy:  Group Therapy  Participation Level:  Active  Participation Quality:  Appropriate  Affect:  Flat  Cognitive:  Oriented  Insight:  Improving  Engagement in Group:  Limited  Engagement in Therapy:  Limited  Modes of Intervention:  Discussion, Exploration and Socialization  Summary of Progress/Problems: The topic for group was balance in life.  Pt participated in the discussion about when their life was in balance and out of balance and how this feels.  Pt discussed ways to get back in balance and short term goals they can work on to get where they want to be. [name] was talkative and appropriate throughout group.  Stated that feels balanced because he woke up and continues to feel content.  He identified feelings of hopelessness in the past, but thinks about pleasant thoughts like funny movies to keep him balanced. Giving or getting guidance, "like if you are a foster parent," was another way of getting into balance that he identified.  When asked about direct experience with that, he side-stepped the question. He expressed concerns about finances.     [name] 07/10/2013 2:22 PM

## 2013-07-10 NOTE — Consult Note (Signed)
Patient needs inpatient treatment

## 2013-07-10 NOTE — Progress Notes (Signed)
D:Pt is guarded when interacting with Probation officer. Pt is reluctant to discuss suicide attempt by overdose. He was looking at this writers badge intently stating "I'm ok."  Pt reports that he is not depressed when writer asked about rating his depression as a ten on his self inventory. Pt reports that he read the sentence wrong. Pt says that he plans to stay near his family for support. A:Offered support, encouragement and 15 minute checks. Gave medication as ordered. R:Pt denies si and hi. Safety maintained on the unit.

## 2013-07-10 NOTE — [facility] Suicide Risk Assessment (Signed)
[facility] INPATIENT:  Family/Significant Other Suicide Prevention Education  Suicide Prevention Education:  Education Completed; No one has been identified by the patient as the family member/significant other with whom the patient will be residing, and identified as the person(s) who will aid the patient in the event of a mental health crisis (suicidal ideations/suicide attempt).  With written consent from the patient, the family member/significant other has been provided the following suicide prevention education, prior to the and/or following the discharge of the patient.  The suicide prevention education provided includes the following:  Suicide risk factors  Suicide prevention and interventions  National Suicide Hotline telephone number  [facility] assessment telephone number  [facility] Emergency Assistance [place] and/or Residential Mobile Crisis Unit telephone number  Request made of family/significant other to:  Remove weapons (e.g., guns, rifles, knives), all items previously/currently identified as safety concern.    Remove drugs/medications (over-the-counter, prescriptions, illicit drugs), all items previously/currently identified as a safety concern.  The family member/significant other verbalizes understanding of the suicide prevention education information provided.  The family member/significant other agrees to remove the items of safety concern listed above.  The patient did not endorse SI at the time of admission, nor did the patient c/o SI during the stay here.  SPE not required.   [name] 07/10/2013, 3:01 PM

## 2013-07-10 NOTE — Progress Notes (Signed)
Pt went to group tonight, participated in [place]

## 2013-07-10 NOTE — Progress Notes (Signed)
[facility] MD Progress Note  07/10/2013 10:05 AM [name]  MRN:  [id number] Subjective: "I am feeling less depressed today." Objective: Patient reports decreased psychosis and depressive symptoms. He says that he is more hopeful and motivated about his life and no longer having suicidal ideation, intent or plan. He is compliant with his medications and has not endorsed any adverse reactions to it. Diagnosis:   DSM5: Schizophrenia Disorders:  Schizophrenia (295.7) Obsessive-Compulsive Disorders:  denies Trauma-Stressor Disorders:  denies Substance/Addictive Disorders:  Cocaine use disorder Depressive Disorders:  Major Depressive Disorder - Moderate (296.22)  Axis I: Chronic Paranoid Schizophrenia            Cocaine use disorder  ADL's:  Intact  Sleep: Fair  Appetite:  Fair  Suicidal Ideation: denies  Homicidal Ideation: denies  AEB (as evidenced by):  Psychiatric Specialty Exam: Review of Systems  Constitutional: Negative.   HENT: Negative.   Eyes: Negative.   Respiratory: Negative.   Cardiovascular: Negative.   Gastrointestinal: Negative.   Genitourinary: Negative.   Musculoskeletal: Positive for joint pain.  Skin: Negative.   Neurological: Negative.   Endo/Heme/Allergies: Negative.   Psychiatric/Behavioral: Positive for depression, hallucinations and substance abuse. The patient is nervous/anxious.     Blood pressure 108/75, pulse 75, temperature 97.9 F (36.6 C), temperature source Oral, resp. rate 20, height 5' 10"  (1.778 m), weight 82.101 kg (181 lb).Body mass index is 25.97 kg/(m^2).  General Appearance: Fairly Groomed  Engineer, water::  Fair  Speech:  Clear and Coherent  Volume:  Normal  Mood:  Depressed  Affect:  Constricted  Thought Process:  Goal Directed  Orientation:  Full (Time, Place, and Person)  Thought Content:  Hallucinations: Auditory  Suicidal Thoughts:  No  Homicidal Thoughts:  No  Memory:  Immediate;   Fair Recent;   Fair Remote;   Fair   Judgement:  Impaired  Insight:  Shallow  Psychomotor Activity:  Normal  Concentration:  Fair  Recall:  Fair  Akathisia:  No  Handed:  Right  AIMS (if indicated):     Assets:  Communication Skills Desire for Improvement Social Support Others:  family support  Sleep:  Number of Hours: 4   Current Medications: Current Facility-Administered Medications  Medication Dose Route Frequency Provider Last Rate Last Dose  . acetaminophen (TYLENOL) tablet 650 mg  650 mg Oral Q6H PRN Hampton Abbot, MD      . alum & mag hydroxide-simeth (MAALOX/MYLANTA) 200-200-20 MG/5ML suspension 30 mL  30 mL Oral Q4H PRN Hampton Abbot, MD      . ferrous gluconate [facility]) tablet 324 mg  324 mg Oral Q breakfast Hampton Abbot, MD   324 mg at 07/10/13 0746  . OLANZapine zydis (ZYPREXA) disintegrating tablet 10 mg  10 mg Oral QHS Elbony Mcclimans   10 mg at 07/09/13 2127  . OLANZapine zydis (ZYPREXA) disintegrating tablet 10 mg  10 mg Oral Q8H PRN Latria Mccarron      . traZODone (DESYREL) tablet 50 mg  50 mg Oral QHS PRN,MR X 1 Elmarie Shiley, NP       Facility-Administered Medications Ordered in Other Encounters  Medication Dose Route Frequency Provider Last Rate Last Dose  . hydrOXYzine (ATARAX/VISTARIL) tablet 50 mg  50 mg Oral TID PRN Encarnacion Slates, NP      . magnesium hydroxide (MILK OF MAGNESIA) suspension 30 mL  30 mL Oral Daily PRN Encarnacion Slates, NP        Lab Results: No results found for this or any  previous visit (from the past 48 hour(s)).  Physical Findings: AIMS: Facial and Oral Movements Muscles of Facial Expression: None, normal Lips and Perioral Area: None, normal Jaw: None, normal Tongue: None, normal,Extremity Movements Upper (arms, wrists, hands, fingers): None, normal Lower (legs, knees, ankles, toes): None, normal, Trunk Movements Neck, shoulders, hips: None, normal, Overall Severity Severity of abnormal movements (highest score from questions above): None, normal Incapacitation due to  abnormal movements: None, normal Patient's awareness of abnormal movements (rate only patient's report): No Awareness, Dental Status Current problems with teeth and/or dentures?: No Does patient usually wear dentures?: No  CIWA:  CIWA-Ar Total: 0 COWS:  COWS Total Score: 0  Treatment Plan Summary: Daily contact with patient to assess and evaluate symptoms and progress in treatment Medication management  Plan:1. Admit for crisis management and stabilization. 2. Medication management to reduce current symptoms to base line and improve the     patient's overall level of functioning 3. Treat health problems as indicated. 4. Develop treatment plan to decrease risk of relapse upon discharge and the need for     readmission. 5. Psycho-social education regarding relapse prevention and self care. 6. Health care follow up as needed for medical problems. 7. Restart home medications where appropriate. 8 Continue current medication regimen. Medical Decision Making Problem Points:  Established problem, stable/improving (1), Review of last therapy session (1) and Review of psycho-social stressors (1) Data Points:  Order Aims Assessment (2) Review of medication regiment & side effects (2) Review of new medications or change in dosage (2)  I certify that inpatient services furnished can reasonably be expected to improve the patient's condition.   Corena Pilgrim, MD 07/10/2013, 10:05 AM

## 2013-07-10 NOTE — Tx Team (Signed)
  Interdisciplinary Treatment Plan Update   Date Reviewed:  07/10/2013  Time Reviewed:  2:53 PM  Progress in Treatment:   Attending groups: Yes Participating in groups: Yes Taking medication as prescribed: Yes  Tolerating medication: Yes Family/Significant other contact made: Yes  Patient understands diagnosis: Yes  Discussing patient identified problems/goals with staff: Yes Medical problems stabilized or resolved: Yes Denies suicidal/homicidal ideation: Yes Patient has not harmed self or others: Yes  For review of initial/current patient goals, please see plan of care.  Estimated Length of Stay:  D/C tomorrow  Reason for Continuation of Hospitalization:   New Problems/Goals identified:  N/A  Discharge Plan or Barriers:   return home, follow up [facility]  Additional Comments:  Attendees:  Signature: Corena Pilgrim, MD 07/10/2013 2:53 PM   Signature: Ripley Fraise, LCSW 07/10/2013 2:53 PM  Signature: Elmarie Shiley, NP 07/10/2013 2:53 PM  Signature: Mayra Neer, RN 07/10/2013 2:53 PM  Signature: Darrol Angel, RN 07/10/2013 2:53 PM  Signature:  07/10/2013 2:53 PM  Signature:   07/10/2013 2:53 PM  Signature:    Signature:    Signature:    Signature:    Signature:    Signature:      Scribe for Treatment Team:   Ripley Fraise, LCSW  07/10/2013 2:53 PM

## 2013-07-10 NOTE — Progress Notes (Signed)
D: Pt forwards a little with this Probation officer. He reports having a good day. He denies any concerns he wishes for this writer to address at this time. He is negative for SI/HI/AVH. He is observed with a minimal amount of interaction within the milieu. He is denying any physical pain.  A: Writer administered scheduled medications to pt. Continued support and availability as needed was extended to this pt. Staff continue to monitor pt with q52mn checks.  R: No adverse drug reactions noted. Pt receptive to treatment. Pt remains safe at this time.

## 2013-07-11 MED ORDER — OLANZAPINE 10 MG PO TBDP: 10.0000 mg | ORAL_TABLET | Freq: Every day | ORAL | Status: DC

## 2013-07-11 MED ORDER — POTASSIUM CHLORIDE ER 10 MEQ PO TBCR: 10.0000 meq | EXTENDED_RELEASE_TABLET | Freq: Two times a day (BID) | ORAL | Status: DC

## 2013-07-11 MED ORDER — PREDNISONE 10 MG PO TABS: 20.0000 mg | ORAL_TABLET | Freq: Every day | ORAL | Status: DC

## 2013-07-11 MED ORDER — TRAZODONE HCL 50 MG PO TABS: 50.0000 mg | ORAL_TABLET | Freq: Every evening | ORAL | Status: DC | PRN

## 2013-07-11 MED ORDER — FERROUS GLUCONATE 216 MG PO TABS: 216.0000 mg | ORAL_TABLET | Freq: Every day | ORAL | Status: DC

## 2013-07-11 MED ORDER — MESALAMINE 1.2 G PO TBEC: 4.8000 g | DELAYED_RELEASE_TABLET | Freq: Every day | ORAL | Status: DC

## 2013-07-11 MED ORDER — PANTOPRAZOLE SODIUM 40 MG PO TBEC: 40.0000 mg | DELAYED_RELEASE_TABLET | Freq: Two times a day (BID) | ORAL | Status: DC

## 2013-07-11 MED ORDER — ONDANSETRON HCL 4 MG PO TABS: 4.0000 mg | ORAL_TABLET | Freq: Four times a day (QID) | ORAL | Status: DC

## 2013-07-11 NOTE — Progress Notes (Signed)
Nrsg DC Pt is given DC AVS, he states he understands it and will comply. He denies audit, vis and tactile halluc. He states he will go to  [facility] and get prescriptions filled this afternoon. He is given all belongings that are locked in his locker and he signs release of property. He is given bus ticket for his transportation home. He is escorted to bldg entrance and [place] per MD order.

## 2013-07-11 NOTE — [facility] Suicide Risk Assessment (Signed)
Suicide Risk Assessment  Discharge Assessment     Demographic Factors:  Male, Low socioeconomic status and Unemployed  Mental Status Per Nursing Assessment::   On Admission:  Self-harm behaviors  Current Mental Status by Physician: patient denies suicidal ideation, intent or plan  Loss Factors: Decrease in vocational status, Decline in physical health and Financial problems/change in socioeconomic status  Historical Factors: Family history of mental illness or substance abuse and Impulsivity  Risk Reduction Factors:   Sense of responsibility to family, Living with another person, especially a relative and Positive social support  Continued Clinical Symptoms:  Alcohol/Substance Abuse/Dependencies  Cognitive Features That Contribute To Risk:  Polarized thinking    Suicide Risk:  Minimal: No identifiable suicidal ideation.  Patients presenting with no risk factors but with morbid ruminations; may be classified as minimal risk based on the severity of the depressive symptoms  Discharge Diagnoses:   AXIS I:  Schizophrenia relapsed              Cocaine use disorder moderate  AXIS II:  Deferred AXIS III:   Past Medical History  Diagnosis Date  . Ulcerative colitis   . GI (gastrointestinal bleed) 04/2013  . Crohn's disease    AXIS IV:  other psychosocial or environmental problems and problems related to social environment AXIS V:  61-70 mild symptoms  Plan Of Care/Follow-up recommendations:  Activity:  as tolerated Diet:  healthy Tests:  routine Other:  patient to keep his after care appointment  Is patient on multiple antipsychotic therapies at discharge:  No   Has Patient had three or more failed trials of antipsychotic monotherapy by history:  No  Recommended Plan for Multiple Antipsychotic Therapies: N/A  Corena Pilgrim, MD 07/11/2013, 8:57 AM

## 2013-07-11 NOTE — Discharge Summary (Signed)
Physician Discharge Summary Note  Patient:  [name] is an 32 y.o., male MRN:  [id number] DOB:  1980/11/19 Patient phone:  [phone] (home)  Patient address:   [place] [address]   Date of Admission:  07/08/2013 Date of Discharge: 07/11/13  Discharge Diagnoses: Principal Problem:   Schizophrenia Active Problems:   SUBSTANCE ABUSE, MULTIPLE   Ulcerative colitis  Axis Diagnosis:  AXIS I: Schizophrenia relapsed  Cocaine use disorder moderate  AXIS II: Deferred  AXIS III:  Past Medical History   Diagnosis  Date   .  Ulcerative colitis    .  GI (gastrointestinal bleed)  04/2013   .  Crohn's disease    AXIS IV: other psychosocial or environmental problems and problems related to social environment  AXIS V: 61-70 mild symptoms   Level of Care:  OP  Hospital Course:   [name] is a 32 year old male who was admitted voluntarily brought in to the [facility] by his mother after the patient attempted to overdose on 24 tabs of an unknown medication, which patient reports to this writer was prednisone. He admits to feeling depressed about some financial problems that he now states "Oh I figured that all out. I got it now." The patient was also reporting he was hearing voices telling him to kill self. The patient was recently at the ED several weeks ago or similar symptoms but was released home with mother's care. The patient has been minimizing his recent symptoms asking writer today "Why are you so concerned?" He has told the nursing staff on the unit when attempting to discuss his reasons for admission "The self harm is all in the past. I won't do that again." [name] appears to have very poor insight into his illness and has trouble processing information. Patient stated to writer today "I'm feeling better. I was off my medications for four days because I used them all. I missed my appointment at [facility] because I was in jail from May to July for a past assault. I'm good  as long as I take my zyprexa. I have trouble functioning when I'm off it." The patient has a silly affect during assessment and does not take his current situation seriously.   While a patient in this hospital, [name] was enrolled in group counseling and activities as well as received the following medication Current facility-administered medications:acetaminophen (TYLENOL) tablet 650 mg, 650 mg, Oral, Q6H PRN, Hampton Abbot, MD;  alum & mag hydroxide-simeth (MAALOX/MYLANTA) 200-200-20 MG/5ML suspension 30 mL, 30 mL, Oral, Q4H PRN, Hampton Abbot, MD;  ferrous gluconate (FERGON) tablet 324 mg, 324 mg, Oral, Q breakfast, Hampton Abbot, MD, 324 mg at 07/11/13 0814 OLANZapine zydis (ZYPREXA) disintegrating tablet 10 mg, 10 mg, Oral, QHS, Mojeed Akintayo, 10 mg at 07/10/13 2135;  OLANZapine zydis (ZYPREXA) disintegrating tablet 10 mg, 10 mg, Oral, Q8H PRN, Mojeed Akintayo;  traZODone (DESYREL) tablet 50 mg, 50 mg, Oral, QHS PRN,MR X 1, Elmarie Shiley, NP Facility-Administered Medications Ordered in Other Encounters: hydrOXYzine (ATARAX/VISTARIL) tablet 50 mg, 50 mg, Oral, TID PRN, Encarnacion Slates, NP;  magnesium hydroxide (MILK OF MAGNESIA) suspension 30 mL, 30 mL, Oral, Daily PRN, Encarnacion Slates, NP The patient was pleasant on the unit. His Zyprexa was increased to 10 mg at hs from 5 mg that was listed prior to admission. The patient reported feeling more stable since being back on his medications. [name] did not appear to have good insight into his symptoms of chronic mental  illness. Patient reported missing an appointment at [facility] previously and was encouraged to attend all follow up appointments in the future. The patient was compliant with his medications during his hospital stay. [name] did not display any behavior problems during his admission and attended groups on the unit. Patient attended treatment team meeting this am and met with treatment team members. Pt symptoms, treatment plan and response to  treatment discussed. [name] endorsed that their symptoms have improved. Pt also stated that they are stable for discharge.  In other to control Principal Problem:   Schizophrenia Active Problems:   SUBSTANCE ABUSE, MULTIPLE   Ulcerative colitis , they will continue psychiatric care on outpatient basis. They will follow-up at      Follow-up Information   Follow up with [facility]. (Go to the walk-in clinic M-F between 8 and 9AM for your hospital follow up appointment.)    Contact information:   [place] [phone]    .  In addition they were instructed to take all your medications as prescribed by your mental healthcare provider, to report any adverse effects and or reactions from your medicines to your outpatient provider promptly, patient is instructed and cautioned to not engage in alcohol and or illegal drug use while on prescription medicines, in the event of worsening symptoms, patient is instructed to call the crisis hotline, 911 and or go to the nearest ED for appropriate evaluation and treatment of symptoms.   Upon discharge, patient adamantly denies suicidal, homicidal ideations, auditory, visual hallucinations and or delusional thinking. They left [facility] with all personal belongings in no apparent distress.  Consults:  See electronic record for details  Significant Diagnostic Studies:  See electronic record for details  Discharge Vitals:   Blood pressure 147/68, pulse 102, temperature 98.8 F (37.1 C), temperature source Oral, resp. rate 16, height 5' 10"  (1.778 m), weight 82.101 kg (181 lb)..  Mental Status Exam: See Mental Status Examination and Suicide Risk Assessment completed by Attending Physician prior to discharge.  Discharge destination:  Home  Is patient on multiple antipsychotic therapies at discharge:  No  Has Patient had three or more failed trials of antipsychotic monotherapy by history: N/A Recommended Plan for Multiple Antipsychotic  Therapies: N/A    Medication List    STOP taking these medications       OLANZapine 5 MG tablet  Commonly known as:  ZYPREXA  Replaced by:  OLANZapine zydis 10 MG disintegrating tablet      TAKE these medications     Indication   ferrous gluconate 216 MG tablet  Commonly known as:  FERGON  Take 1 tablet (216 mg total) by mouth daily.   Indication:  Anemia From Inadequate Iron in the Body     mesalamine 1.2 G EC tablet  Commonly known as:  LIALDA  Take 4 tablets (4.8 g total) by mouth daily with breakfast.   Indication:  Ulcerated Colon     OLANZapine zydis 10 MG disintegrating tablet  Commonly known as:  ZYPREXA  Take 1 tablet (10 mg total) by mouth at bedtime.   Indication:  Schizophrenia     ondansetron 4 MG tablet  Commonly known as:  ZOFRAN  Take 1 tablet (4 mg total) by mouth every 6 (six) hours.   Indication:  Nausea     pantoprazole 40 MG tablet  Commonly known as:  PROTONIX  Take 1 tablet (40 mg total) by mouth 2 (two) times daily.  Indication:  Gastroesophageal Reflux Disease     potassium chloride 10 MEQ tablet  Commonly known as:  K-DUR  Take 1 tablet (10 mEq total) by mouth 2 (two) times daily.   Indication:  Low Amount of Potassium in the Blood     predniSONE 10 MG tablet  Commonly known as:  DELTASONE  Take 2 tablets (20 mg total) by mouth daily.   Indication:  Ulcerated Colon     traZODone 50 MG tablet  Commonly known as:  DESYREL  Take 1 tablet (50 mg total) by mouth at bedtime as needed and may repeat dose one time if needed for sleep.   Indication:  Trouble Sleeping       Follow-up Information   Follow up with [facility]. (Go to the walk-in clinic M-F between 8 and 9AM for your hospital follow up appointment.)    Contact information:   [place] [phone]     Follow-up recommendations:   Activities: Resume typical activities Diet: Resume typical diet Tests: none Other: Follow up with outpatient provider and  report any side effects to out patient prescriber.  Comments:  Take all your medications as prescribed by your mental healthcare provider. Report any adverse effects and or reactions from your medicines to your outpatient provider promptly. Patient is instructed and cautioned to not engage in alcohol and or illegal drug use while on prescription medicines. In the event of worsening symptoms, patient is instructed to call the crisis hotline, 911 and or go to the nearest ED for appropriate evaluation and treatment of symptoms. Follow-up with your primary care provider for your other medical issues, concerns and or health care needs.  SignedElmarie Shiley NP-C 07/11/2013 9:25 AM

## 2013-07-11 NOTE — Progress Notes (Signed)
Recreation Therapy Notes   Date: 09.26.2014 Time: 9:30am Location: [address] Dayroom  Group Topic: Coping Skills  Goal Area(s) Addresses:  Patient will successfully express themselves using art.   Behavioral Response: Appropriate, Engaged, Resistant  Intervention: Art  Activity: Patient was given a worksheet with two facial profiles facing each other on it. Using the worksheet patient was asked to draw themselves at admission on the left side of the worksheet and the way they predict themselves to look at discharge on the right side of the worksheet.   Education: Self- Expression  Education Outcome: Acknowledges understanding  Clinical Observations/Feedback: Patient actively participated in activity. On the left side of patients worksheet he drew a gun, knife, a man being run over by a car and a dollar sign. LRT asked if these drawing represented patient's current lifestyle. Patient refused to answer. Patient drew a sun and moo, heart and a plus sign on the opposing side, stating this represents love and the life he would like to lead.   [name], LRT/CTRS  Lane Hacker 07/11/2013 12:54 PM

## 2013-07-14 NOTE — Discharge Summary (Signed)
Seen and agreed. Corena Pilgrim, MD

## 2013-07-15 NOTE — Progress Notes (Signed)
Patient Discharge Instructions:  After Visit Summary (AVS):   Faxed to:  07/15/13 Discharge Summary Note:   Faxed to:  07/15/13 Psychiatric Admission Assessment Note:   Faxed to:  07/15/13 Suicide Risk Assessment - Discharge Assessment:   Faxed to:  07/15/13 Faxed/Sent to the Next Level Care provider:  07/15/13 Faxed to [facility] @ [place], 07/15/2013, 3:52 PM

## 2014-03-03 ENCOUNTER — Ambulatory Visit: Payer: Self-pay | Admitting: Gastroenterology

## 2014-05-25 ENCOUNTER — Encounter (HOSPITAL_COMMUNITY): Payer: Self-pay | Admitting: Emergency Medicine

## 2014-05-25 ENCOUNTER — Emergency Department (HOSPITAL_COMMUNITY)
Admission: EM | Admit: 2014-05-25 | Discharge: 2014-05-25 | Disposition: A | Discharge status: Home / Self Care | Payer: Medicare Other | Attending: Emergency Medicine | Admitting: Emergency Medicine

## 2014-05-25 DIAGNOSIS — K5190 Ulcerative colitis, unspecified, without complications: Secondary | ICD-10-CM | POA: Insufficient documentation

## 2014-05-25 DIAGNOSIS — R109 Unspecified abdominal pain: Secondary | ICD-10-CM | POA: Diagnosis present

## 2014-05-25 DIAGNOSIS — IMO0002 Reserved for concepts with insufficient information to code with codable children: Secondary | ICD-10-CM | POA: Insufficient documentation

## 2014-05-25 DIAGNOSIS — R5383 Other fatigue: Secondary | ICD-10-CM

## 2014-05-25 DIAGNOSIS — R079 Chest pain, unspecified: Secondary | ICD-10-CM | POA: Diagnosis not present

## 2014-05-25 DIAGNOSIS — Z79899 Other long term (current) drug therapy: Secondary | ICD-10-CM | POA: Diagnosis not present

## 2014-05-25 DIAGNOSIS — R112 Nausea with vomiting, unspecified: Secondary | ICD-10-CM | POA: Insufficient documentation

## 2014-05-25 DIAGNOSIS — F17200 Nicotine dependence, unspecified, uncomplicated: Secondary | ICD-10-CM | POA: Insufficient documentation

## 2014-05-25 DIAGNOSIS — Z8659 Personal history of other mental and behavioral disorders: Secondary | ICD-10-CM | POA: Insufficient documentation

## 2014-05-25 DIAGNOSIS — K5090 Crohn's disease, unspecified, without complications: Secondary | ICD-10-CM | POA: Insufficient documentation

## 2014-05-25 DIAGNOSIS — R197 Diarrhea, unspecified: Secondary | ICD-10-CM | POA: Insufficient documentation

## 2014-05-25 DIAGNOSIS — K51911 Ulcerative colitis, unspecified with rectal bleeding: Secondary | ICD-10-CM

## 2014-05-25 DIAGNOSIS — R5381 Other malaise: Secondary | ICD-10-CM | POA: Insufficient documentation

## 2014-05-25 LAB — COMPREHENSIVE METABOLIC PANEL
ALBUMIN: 3.7 g/dL (ref 3.5–5.2)
ALK PHOS: 98 U/L (ref 39–117)
ALT: 13 U/L (ref 0–53)
ANION GAP: 14 (ref 5–15)
AST: 19 U/L (ref 0–37)
BUN: 13 mg/dL (ref 6–23)
CALCIUM: 9.1 mg/dL (ref 8.4–10.5)
CO2: 24 mEq/L (ref 19–32)
Chloride: 100 mEq/L (ref 96–112)
Creatinine, Ser: 0.84 mg/dL (ref 0.50–1.35)
GFR calc Af Amer: >90 mL/min (ref >90)
GFR calc non Af Amer: >90 mL/min (ref >90)
Glucose, Bld: 164 mg/dL — ABNORMAL HIGH (ref 70–99)
POTASSIUM: 4.5 meq/L (ref 3.7–5.3)
Sodium: 138 mEq/L (ref 137–147)
TOTAL PROTEIN: 8 g/dL (ref 6.0–8.3)
Total Bilirubin: 0.3 mg/dL (ref 0.3–1.2)

## 2014-05-25 LAB — CBC WITH DIFFERENTIAL/PLATELET
BASOS ABS: 0 10*3/uL (ref 0.0–0.1)
Basophils Relative: 0 % (ref 0–1)
EOS ABS: 1 10*3/uL — AB (ref 0.0–0.7)
Eosinophils Relative: 6 % — ABNORMAL HIGH (ref 0–5)
HCT: 37 % — ABNORMAL LOW (ref 39.0–52.0)
Hemoglobin: 11.3 g/dL — ABNORMAL LOW (ref 13.0–17.0)
LYMPHS PCT: 11 % — AB (ref 12–46)
Lymphs Abs: 1.9 10*3/uL (ref 0.7–4.0)
MCH: 22.3 pg — ABNORMAL LOW (ref 26.0–34.0)
MCHC: 30.5 g/dL (ref 30.0–36.0)
MCV: 73 fL — ABNORMAL LOW (ref 78.0–100.0)
MONO ABS: 1.5 10*3/uL — AB (ref 0.1–1.0)
Monocytes Relative: 9 % (ref 3–12)
NEUTROS ABS: 12.7 10*3/uL — AB (ref 1.7–7.7)
Neutrophils Relative %: 74 % (ref 43–77)
Platelets: 532 10*3/uL — ABNORMAL HIGH (ref 150–400)
RBC: 5.07 MIL/uL (ref 4.22–5.81)
RDW: 17.8 % — AB (ref 11.5–15.5)
WBC: 17.1 10*3/uL — ABNORMAL HIGH (ref 4.0–10.5)

## 2014-05-25 LAB — URINALYSIS, ROUTINE W REFLEX MICROSCOPIC
Glucose, UA: 250 mg/dL — AB
Hgb urine dipstick: NEGATIVE
Ketones, ur: 15 mg/dL — AB
LEUKOCYTES UA: NEGATIVE
Nitrite: NEGATIVE
PROTEIN: 30 mg/dL — AB
SPECIFIC GRAVITY, URINE: 1.034 — AB (ref 1.005–1.030)
Urobilinogen, UA: 1 mg/dL (ref 0.0–1.0)
pH: 5.5 (ref 5.0–8.0)

## 2014-05-25 LAB — LIPASE, BLOOD: Lipase: 23 U/L (ref 11–59)

## 2014-05-25 LAB — URINE MICROSCOPIC-ADD ON

## 2014-05-25 MED ORDER — ONDANSETRON HCL 4 MG/2ML IJ SOLN
4.0000 mg | Freq: Once | INTRAMUSCULAR | Status: AC
  Administered 2014-05-25: 4 mg via INTRAVENOUS
  Filled 2014-05-25: qty 2

## 2014-05-25 MED ORDER — HYDROMORPHONE HCL PF 1 MG/ML IJ SOLN
1.0000 mg | Freq: Once | INTRAMUSCULAR | Status: AC
  Administered 2014-05-25: 1 mg via INTRAVENOUS
  Filled 2014-05-25: qty 1

## 2014-05-25 MED ORDER — ONDANSETRON 4 MG PO TBDP
4.0000 mg | ORAL_TABLET | Freq: Once | ORAL | Status: AC
  Administered 2014-05-25: 4 mg via ORAL
  Filled 2014-05-25: qty 1

## 2014-05-25 MED ORDER — PREDNISONE 20 MG PO TABS
40.0000 mg | ORAL_TABLET | Freq: Once | ORAL | Status: AC
  Administered 2014-05-25: 40 mg via ORAL
  Filled 2014-05-25: qty 2

## 2014-05-25 MED ORDER — OXYCODONE-ACETAMINOPHEN 5-325 MG PO TABS
1.0000 | ORAL_TABLET | Freq: Once | ORAL | Status: AC
  Administered 2014-05-25: 1 via ORAL
  Filled 2014-05-25: qty 1

## 2014-05-25 MED ORDER — PREDNISONE 10 MG PO TABS: ORAL_TABLET | ORAL | Status: DC

## 2014-05-25 MED ORDER — OXYCODONE-ACETAMINOPHEN 5-325 MG PO TABS: 1.0000 | ORAL_TABLET | Freq: Four times a day (QID) | ORAL | Status: DC | PRN

## 2014-05-25 MED ORDER — SODIUM CHLORIDE 0.9 % IV BOLUS (SEPSIS): 1000.0000 mL | Freq: Once | INTRAVENOUS | Status: DC

## 2014-05-25 MED ORDER — MESALAMINE 1.2 G PO TBEC: 2.4000 g | DELAYED_RELEASE_TABLET | Freq: Two times a day (BID) | ORAL | Status: DC

## 2014-05-25 MED ORDER — MESALAMINE 1.2 G PO TBEC: 2.4000 g | DELAYED_RELEASE_TABLET | Freq: Every day | ORAL | Status: DC

## 2014-05-25 MED ORDER — SODIUM CHLORIDE 0.9 % IV BOLUS (SEPSIS)
1000.0000 mL | Freq: Once | INTRAVENOUS | Status: AC
  Administered 2014-05-25: 1000 mL via INTRAVENOUS

## 2014-05-25 NOTE — ED Notes (Signed)
PA at bedside.

## 2014-05-25 NOTE — ED Notes (Addendum)
Attempted IV stick x2, unsuccessful. IV team notifed

## 2014-05-25 NOTE — ED Notes (Signed)
Ice water given

## 2014-05-25 NOTE — ED Notes (Signed)
Josh, PA at bedside. °

## 2014-05-25 NOTE — Discharge Instructions (Signed)
Please read and follow all provided instructions.  Your diagnoses today include:  1. Ulcerative colitis, with rectal bleeding     Tests performed today include:  Blood counts and electrolytes  Blood tests to check liver and kidney function  Urine test to look for infection and pregnancy (in women)  Vital signs. See below for your results today.   Medications prescribed:   Mesalamine - medication to help prevent flares of ulcerative colitis   Percocet (oxycodone/acetaminophen) - narcotic pain medication  DO NOT drive or perform any activities that require you to be awake and alert because this medicine can make you drowsy. BE VERY CAREFUL not to take multiple medicines containing Tylenol (also called acetaminophen). Doing so can lead to an overdose which can damage your liver and cause liver failure and possibly death.   Prednisone - steroid medicine   It is best to take this medication in the morning to prevent sleeping problems. If you are diabetic, monitor your blood sugar closely and stop taking Prednisone if blood sugar is over 300. Take with food to prevent stomach upset.   Take any prescribed medications only as directed.  Home care instructions:   Follow any educational materials contained in this packet.  Follow-up instructions: Please follow-up with your primary care provider in the next 7 days for further evaluation of your symptoms.    Return instructions:  SEEK IMMEDIATE MEDICAL ATTENTION IF:  The pain does not go away or becomes severe   A temperature above 101F develops   Repeated vomiting occurs (multiple episodes)   The pain becomes localized to portions of the abdomen. The right side could possibly be appendicitis. In an adult, the left lower portion of the abdomen could be colitis or diverticulitis.   Blood is being passed in stools or vomit (bright red or black tarry stools)   You develop chest pain, difficulty breathing, dizziness or fainting,  or become confused, poorly responsive, or inconsolable (young children)  If you have any other emergent concerns regarding your health  Additional Information: Abdominal (belly) pain can be caused by many things. Your caregiver performed an examination and possibly ordered blood/urine tests and imaging (CT scan, x-rays, ultrasound). Many cases can be observed and treated at home after initial evaluation in the emergency department. Even though you are being discharged home, abdominal pain can be unpredictable. Therefore, you need a repeated exam if your pain does not resolve, returns, or worsens. Most patients with abdominal pain don't have to be admitted to the hospital or have surgery, but serious problems like appendicitis and gallbladder attacks can start out as nonspecific pain. Many abdominal conditions cannot be diagnosed in one visit, so follow-up evaluations are very important.  Your vital signs today were: BP 122/66   Pulse 80   Temp(Src) 98.5 F (36.9 C) (Oral)   Resp 20   SpO2 97% If your blood pressure (bp) was elevated above 135/85 this visit, please have this repeated by your doctor within one month. --------------

## 2014-05-25 NOTE — ED Notes (Signed)
Pt c/o lower abd pain x 2 days with N/V and some red blood in stool; pt sts hx of ulcerative colitis

## 2014-05-25 NOTE — ED Provider Notes (Signed)
CSN: [id number]     Arrival date & time 05/25/14  1031 History   First MD Initiated Contact with Patient 05/25/14 1105     Chief Complaint  Patient presents with  . Abdominal Pain  . Vomiting     (Consider location/radiation/quality/duration/timing/severity/associated sxs/prior Treatment) HPI Comments: Patient with history of ulcerative colitis, schizophrenia, taking no preventative medications for the past 8 months -- presents with complaint of nausea, vomiting, diarrhea with small amount of red blood, abdominal cramping consistent with previous ulcerative colitis symptoms. Patient states that he gets flares approximately twice a year. He states that he has been able to eat and drink but will vomit but some time afterwards. He denies fever, URI symptoms, cough, shortness of breath. Patient reports chest pain only when vomiting. No urinary symptoms including dysuria or hematuria. No skin rash. No treatments prior to arrival. Onset of symptoms acute. Course is constant. Nothing makes symptoms better or worse.  Patient is a 33 y.o. male presenting with abdominal pain. The history is provided by the patient and medical records.  Abdominal Pain Associated symptoms: chest pain (with vomiting), diarrhea, fatigue, nausea and vomiting   Associated symptoms: no cough, no dysuria, no fever and no sore throat     Past Medical History  Diagnosis Date  . Ulcerative colitis   . Schizophrenia   . GI (gastrointestinal bleed) 04/2013  . Crohn's disease   . Suicide attempt by drug ingestion    Past Surgical History  Procedure Laterality Date  . Mandible fracture surgery    . Flexible sigmoidoscopy  12/05/2011    Procedure: FLEXIBLE SIGMOIDOSCOPY;  Surgeon: Beryle Beams, MD;  Location: [facility] ENDOSCOPY;  Service: Endoscopy;  Laterality: N/A;  . Esophagogastroduodenoscopy N/A 05/24/2013    Procedure: ESOPHAGOGASTRODUODENOSCOPY (EGD);  Surgeon: Ladene Artist, MD;  Location: [facility] ENDOSCOPY;  Service: Endoscopy;   Laterality: N/A;   Family History  Problem Relation Age of Onset  . Liver cancer     History  Substance Use Topics  . Smoking status: Current Every Day Smoker -- 0.25 packs/day for 20 years    Types: Cigarettes  . Smokeless tobacco: Never Used  . Alcohol Use: Yes     Comment: occasional    Review of Systems  Constitutional: Positive for fatigue. Negative for fever.  HENT: Negative for rhinorrhea and sore throat.   Eyes: Negative for redness.  Respiratory: Negative for cough.   Cardiovascular: Positive for chest pain (with vomiting). Negative for palpitations and leg swelling.  Gastrointestinal: Positive for nausea, vomiting, abdominal pain, diarrhea and blood in stool.  Genitourinary: Negative for dysuria.  Musculoskeletal: Negative for myalgias.  Skin: Negative for rash.  Neurological: Negative for headaches.    Allergies  Depakote; Lithium; and Nsaids  Home Medications   Prior to Admission medications   Medication Sig Start Date End Date Taking? Authorizing Provider  ferrous gluconate (FERGON) 216 MG tablet Take 1 tablet (216 mg total) by mouth daily. 07/11/13   Elmarie Shiley, NP  mesalamine (LIALDA) 1.2 G EC tablet Take 4 tablets (4.8 g total) by mouth daily with breakfast. 07/11/13   Elmarie Shiley, NP  OLANZapine zydis (ZYPREXA) 10 MG disintegrating tablet Take 1 tablet (10 mg total) by mouth at bedtime. 07/11/13   Elmarie Shiley, NP  ondansetron (ZOFRAN) 4 MG tablet Take 1 tablet (4 mg total) by mouth every 6 (six) hours. 07/11/13   Elmarie Shiley, NP  pantoprazole (PROTONIX) 40 MG tablet Take 1 tablet (40 mg total) by mouth 2 (two) times  daily. 07/11/13   Elmarie Shiley, NP  potassium chloride (K-DUR) 10 MEQ tablet Take 1 tablet (10 mEq total) by mouth 2 (two) times daily. 07/11/13   Elmarie Shiley, NP  predniSONE (DELTASONE) 10 MG tablet Take 2 tablets (20 mg total) by mouth daily. 07/11/13   Elmarie Shiley, NP  traZODone (DESYREL) 50 MG tablet Take 1 tablet (50 mg total) by mouth at bedtime  as needed and may repeat dose one time if needed for sleep. 07/11/13   Elmarie Shiley, NP   BP 129/82  Pulse 116  Temp(Src) 98.5 F (36.9 C) (Oral)  Resp 20  SpO2 98%  Physical Exam  Nursing note and vitals reviewed. Constitutional: He appears well-developed and well-nourished.  HENT:  Head: Normocephalic and atraumatic.  Mouth/Throat: Uvula is midline. Mucous membranes are dry. No oropharyngeal exudate.  Eyes: Conjunctivae are normal. Right eye exhibits no discharge. Left eye exhibits no discharge.  Neck: Normal range of motion. Neck supple.  Cardiovascular: Regular rhythm and normal heart sounds.  Tachycardia present.   No murmur heard. Pulmonary/Chest: Effort normal and breath sounds normal. No respiratory distress. He has no wheezes. He has no rales.  Abdominal: Soft. Bowel sounds are normal. He exhibits no distension. There is tenderness (Generalized, mild). There is no rebound and no guarding.  Neurological: He is alert.  Skin: Skin is warm and dry. No rash noted.  Psychiatric: He has a normal mood and affect.    ED Course  Procedures (including critical care time) Labs Review Labs Reviewed  CBC WITH DIFFERENTIAL - Abnormal; Notable for the following:    WBC 17.1 (*)    Hemoglobin 11.3 (*)    HCT 37.0 (*)    MCV 73.0 (*)    MCH 22.3 (*)    RDW 17.8 (*)    Platelets 532 (*)    Lymphocytes Relative 11 (*)    Eosinophils Relative 6 (*)    Neutro Abs 12.7 (*)    Monocytes Absolute 1.5 (*)    Eosinophils Absolute 1.0 (*)    All other components within normal limits  COMPREHENSIVE METABOLIC PANEL - Abnormal; Notable for the following:    Glucose, Bld 164 (*)    All other components within normal limits  URINALYSIS, ROUTINE W REFLEX MICROSCOPIC - Abnormal; Notable for the following:    Color, Urine AMBER (*)    Specific Gravity, Urine 1.034 (*)    Glucose, UA 250 (*)    Bilirubin Urine SMALL (*)    Ketones, ur 15 (*)    Protein, ur 30 (*)    All other components  within normal limits  URINE MICROSCOPIC-ADD ON - Abnormal; Notable for the following:    Casts HYALINE CASTS (*)    All other components within normal limits  LIPASE, BLOOD    Imaging Review No results found.   EKG Interpretation None      11:18 AM Patient seen and examined. Work-up initiated. Medications ordered. No focal abdominal pain. Previous CT's show signs consistent with inflammatory bowel disease. Will treat symptoms, check labs, monitor.  Vital signs reviewed and are as follows: BP 129/82  Pulse 116  Temp(Src) 98.5 F (36.9 C) (Oral)  Resp 20  SpO2 98%  4:28 PM Patient checked several times with gradual improvement in symptoms. He is not vomiting and his pain is controlled. Offered admission versus discharge. He does not want to stay and states he is feeling well enough that he wants to try outpatient treatment.   I discussed the  case with Dr. Thurnell Garbe.   I spoke with on-call GI, Dr. Collene Mares, who recommends starting Lialda 2.4g bid, prolonged prednisone taper 18m x 10 days, 362mx 10 days, 2050m 10 days, 51m19m10 days. First dose here. Per [facility].   I have given referral info. Abdominal exam unchanged. Pain is mild.  The patient was urged to return to the Emergency Department immediately with worsening of current symptoms, worsening abdominal pain, persistent vomiting, worsening amt of blood noted in stools, fever, or any other concerns. The patient verbalized understanding.   MDM   Final diagnoses:  Ulcerative colitis, with rectal bleeding   Patient with h/o UC, with symptoms consistent with previous episodes of UC, supported by previous CT scans and inpatient documentation. Patient with mild dehydration upon arrival, improved with fluids. Nausea, pain controlled with medications in emergency department. Patient does not want to stay in the hospital given his improvement. I have spoken with GI to discuss an appropriate regimen to help  get symptoms under control. Patient given referrals. Feel that outpatient trial is appropriate in this patient. He seems reliable to return with worsening or changing symptoms.     JoshCarlisle Cater-C 05/25/14 [phone]

## 2014-05-28 NOTE — ED Provider Notes (Signed)
Medical screening examination/treatment/procedure(s) were performed by non-physician practitioner and as supervising physician I was immediately available for consultation/collaboration.   EKG Interpretation None        Francine Graven, DO 05/28/14 1531

## 2014-07-26 ENCOUNTER — Emergency Department (HOSPITAL_COMMUNITY)
Admission: EM | Admit: 2014-07-26 | Discharge: 2014-07-26 | Disposition: A | Discharge status: Home / Self Care | Payer: Medicare Other | Attending: Emergency Medicine | Admitting: Emergency Medicine

## 2014-07-26 ENCOUNTER — Encounter (HOSPITAL_COMMUNITY): Payer: Self-pay | Admitting: Emergency Medicine

## 2014-07-26 ENCOUNTER — Emergency Department (HOSPITAL_COMMUNITY): Payer: Medicare Other

## 2014-07-26 DIAGNOSIS — Z720 Tobacco use: Secondary | ICD-10-CM | POA: Diagnosis not present

## 2014-07-26 DIAGNOSIS — R000 Tachycardia, unspecified: Secondary | ICD-10-CM | POA: Insufficient documentation

## 2014-07-26 DIAGNOSIS — R1084 Generalized abdominal pain: Secondary | ICD-10-CM

## 2014-07-26 DIAGNOSIS — K5090 Crohn's disease, unspecified, without complications: Secondary | ICD-10-CM | POA: Insufficient documentation

## 2014-07-26 DIAGNOSIS — F209 Schizophrenia, unspecified: Secondary | ICD-10-CM | POA: Diagnosis not present

## 2014-07-26 DIAGNOSIS — E876 Hypokalemia: Secondary | ICD-10-CM | POA: Diagnosis not present

## 2014-07-26 DIAGNOSIS — R112 Nausea with vomiting, unspecified: Secondary | ICD-10-CM | POA: Diagnosis present

## 2014-07-26 DIAGNOSIS — T50902A Poisoning by unspecified drugs, medicaments and biological substances, intentional self-harm, initial encounter: Secondary | ICD-10-CM | POA: Insufficient documentation

## 2014-07-26 DIAGNOSIS — K5190 Ulcerative colitis, unspecified, without complications: Secondary | ICD-10-CM | POA: Diagnosis not present

## 2014-07-26 DIAGNOSIS — Z79899 Other long term (current) drug therapy: Secondary | ICD-10-CM | POA: Insufficient documentation

## 2014-07-26 DIAGNOSIS — R197 Diarrhea, unspecified: Secondary | ICD-10-CM

## 2014-07-26 DIAGNOSIS — Z8719 Personal history of other diseases of the digestive system: Secondary | ICD-10-CM

## 2014-07-26 DIAGNOSIS — D72829 Elevated white blood cell count, unspecified: Secondary | ICD-10-CM | POA: Diagnosis not present

## 2014-07-26 LAB — CBC WITH DIFFERENTIAL/PLATELET
BASOS ABS: 0.1 10*3/uL (ref 0.0–0.1)
Basophils Relative: 1 % (ref 0–1)
Eosinophils Absolute: 0.5 10*3/uL (ref 0.0–0.7)
Eosinophils Relative: 4 % (ref 0–5)
HEMATOCRIT: 38.2 % — AB (ref 39.0–52.0)
Hemoglobin: 11.5 g/dL — ABNORMAL LOW (ref 13.0–17.0)
Lymphocytes Relative: 13 % (ref 12–46)
Lymphs Abs: 1.7 10*3/uL (ref 0.7–4.0)
MCH: 21.6 pg — AB (ref 26.0–34.0)
MCHC: 30.1 g/dL (ref 30.0–36.0)
MCV: 71.7 fL — AB (ref 78.0–100.0)
MONO ABS: 1.7 10*3/uL — AB (ref 0.1–1.0)
Monocytes Relative: 13 % — ABNORMAL HIGH (ref 3–12)
NEUTROS ABS: 9.4 10*3/uL — AB (ref 1.7–7.7)
Neutrophils Relative %: 69 % (ref 43–77)
PLATELETS: 396 10*3/uL (ref 150–400)
RBC: 5.33 MIL/uL (ref 4.22–5.81)
RDW: 18.8 % — AB (ref 11.5–15.5)
WBC: 13.4 10*3/uL — ABNORMAL HIGH (ref 4.0–10.5)

## 2014-07-26 LAB — COMPREHENSIVE METABOLIC PANEL
ALBUMIN: 3.3 g/dL — AB (ref 3.5–5.2)
ALT: 8 U/L (ref 0–53)
ANION GAP: 12 (ref 5–15)
AST: 13 U/L (ref 0–37)
Alkaline Phosphatase: 93 U/L (ref 39–117)
BILIRUBIN TOTAL: 0.3 mg/dL (ref 0.3–1.2)
BUN: 8 mg/dL (ref 6–23)
CALCIUM: 8.7 mg/dL (ref 8.4–10.5)
CHLORIDE: 102 meq/L (ref 96–112)
CO2: 24 meq/L (ref 19–32)
Creatinine, Ser: 0.94 mg/dL (ref 0.50–1.35)
GFR calc Af Amer: >90 mL/min (ref >90)
Glucose, Bld: 150 mg/dL — ABNORMAL HIGH (ref 70–99)
Potassium: 3.3 mEq/L — ABNORMAL LOW (ref 3.7–5.3)
Sodium: 138 mEq/L (ref 137–147)
Total Protein: 7.7 g/dL (ref 6.0–8.3)

## 2014-07-26 LAB — URINALYSIS, ROUTINE W REFLEX MICROSCOPIC
BILIRUBIN URINE: NEGATIVE
Glucose, UA: 100 mg/dL — AB
Hgb urine dipstick: NEGATIVE
KETONES UR: NEGATIVE mg/dL
LEUKOCYTES UA: NEGATIVE
Nitrite: NEGATIVE
PH: 7.5 (ref 5.0–8.0)
Protein, ur: NEGATIVE mg/dL
Specific Gravity, Urine: 1.022 (ref 1.005–1.030)
UROBILINOGEN UA: 1 mg/dL (ref 0.0–1.0)

## 2014-07-26 LAB — LIPASE, BLOOD: LIPASE: 19 U/L (ref 11–59)

## 2014-07-26 LAB — POC OCCULT BLOOD, ED: Fecal Occult Bld: NEGATIVE

## 2014-07-26 MED ORDER — ONDANSETRON HCL 4 MG PO TABS: 4.0000 mg | ORAL_TABLET | Freq: Four times a day (QID) | ORAL | Status: DC

## 2014-07-26 MED ORDER — MORPHINE SULFATE 4 MG/ML IJ SOLN
4.0000 mg | Freq: Once | INTRAMUSCULAR | Status: AC
  Administered 2014-07-26: 4 mg via INTRAVENOUS
  Filled 2014-07-26: qty 1

## 2014-07-26 MED ORDER — ONDANSETRON HCL 4 MG/2ML IJ SOLN
4.0000 mg | Freq: Once | INTRAMUSCULAR | Status: AC
  Administered 2014-07-26: 4 mg via INTRAVENOUS
  Filled 2014-07-26: qty 2

## 2014-07-26 MED ORDER — SODIUM CHLORIDE 0.9 % IV BOLUS (SEPSIS)
1000.0000 mL | Freq: Once | INTRAVENOUS | Status: AC
  Administered 2014-07-26: 1000 mL via INTRAVENOUS

## 2014-07-26 MED ORDER — IOHEXOL 300 MG/ML  SOLN
100.0000 mL | Freq: Once | INTRAMUSCULAR | Status: AC | PRN
  Administered 2014-07-26: 100 mL via INTRAVENOUS

## 2014-07-26 MED ORDER — MESALAMINE 1.2 G PO TBEC: 2.4000 g | DELAYED_RELEASE_TABLET | Freq: Every day | ORAL | Status: DC

## 2014-07-26 MED ORDER — IOHEXOL 300 MG/ML  SOLN
25.0000 mL | Freq: Once | INTRAMUSCULAR | Status: AC | PRN
Start: 2014-07-26 — End: 2014-07-26
  Administered 2014-07-26: 25 mL via ORAL

## 2014-07-26 MED ORDER — POTASSIUM CHLORIDE CRYS ER 20 MEQ PO TBCR
40.0000 meq | EXTENDED_RELEASE_TABLET | Freq: Once | ORAL | Status: AC
  Administered 2014-07-26: 40 meq via ORAL
  Filled 2014-07-26: qty 2

## 2014-07-26 MED ORDER — HYDROMORPHONE HCL 1 MG/ML IJ SOLN
1.0000 mg | Freq: Once | INTRAMUSCULAR | Status: AC
  Administered 2014-07-26: 1 mg via INTRAVENOUS
  Filled 2014-07-26: qty 1

## 2014-07-26 MED ORDER — MESALAMINE 1.2 G PO TBEC: 2.4000 g | DELAYED_RELEASE_TABLET | Freq: Two times a day (BID) | ORAL | Status: DC

## 2014-07-26 MED ORDER — METHYLPREDNISOLONE SODIUM SUCC 125 MG IJ SOLR
125.0000 mg | Freq: Once | INTRAMUSCULAR | Status: AC
  Administered 2014-07-26: 125 mg via INTRAVENOUS
  Filled 2014-07-26: qty 2

## 2014-07-26 NOTE — ED Notes (Signed)
Pt to ct via transporter

## 2014-07-26 NOTE — ED Notes (Signed)
Nv x 3 days.  States someone checked bp and it was high (142/60 for ems).  Hx of crohn's.

## 2014-07-26 NOTE — ED Provider Notes (Signed)
CSN: [id number]     Arrival date & time 07/26/14  1053 History   First MD Initiated Contact with Patient 07/26/14 1055     No chief complaint on file.    (Consider location/radiation/quality/duration/timing/severity/associated sxs/prior Treatment) HPI Comments: Patient with a history of Ulcerative Colitis presents today with a chief complaint of generalized abdominal pain, nausea, vomiting, and diarrhea.  Symptoms began three days ago and are gradually worsening.  Abdominal pain is generalized and does not radiate.  He reports numerous episodes of diarrhea and vomiting over the past several days.  He reports that yesterday he noticed some bright red blood in his stool.  He reports that his symptoms are similar to symptoms that he has had in the past with an exacerbation of his Ulcerative Colitis.  He has not taken anything recently for pain.  He reports that he has been followed with GI in the past.  Review of the chart shows that he has been seen by Dr. Ardis Hughs.  He has not seen GI in the past year.  Patient was seen in the ED for similar complaints two months ago.  At that time GI was consulted by provider.  GI recommended starting the patient on a long Prednisone taper and also starting him on Lialda.  Patient reports that he is not taking the Lialda, but is currently taking Prednisone daily.  He states that he has not taken the Lialda for the past 4 months.  He reports that he was incarcerated for 8 months and got his medication during incarceration, but has not had his Lialda since released four months ago.  He is unsure of the dose of Prednisone that he is on, but thinks that it is 10 mg.   He denies fever, chills, constipation, or urinary symptoms.    The history is provided by the patient.    Past Medical History  Diagnosis Date  . Ulcerative colitis   . Schizophrenia   . GI (gastrointestinal bleed) 04/2013  . Crohn's disease   . Suicide attempt by drug ingestion    Past Surgical History   Procedure Laterality Date  . Mandible fracture surgery    . Flexible sigmoidoscopy  12/05/2011    Procedure: FLEXIBLE SIGMOIDOSCOPY;  Surgeon: Beryle Beams, MD;  Location: [facility] ENDOSCOPY;  Service: Endoscopy;  Laterality: N/A;  . Esophagogastroduodenoscopy N/A 05/24/2013    Procedure: ESOPHAGOGASTRODUODENOSCOPY (EGD);  Surgeon: Ladene Artist, MD;  Location: [facility] ENDOSCOPY;  Service: Endoscopy;  Laterality: N/A;   Family History  Problem Relation Age of Onset  . Liver cancer     History  Substance Use Topics  . Smoking status: Current Every Day Smoker -- 0.25 packs/day for 20 years    Types: Cigarettes  . Smokeless tobacco: Never Used  . Alcohol Use: Yes     Comment: occasional    Review of Systems  All other systems reviewed and are negative.     Allergies  Depakote; Lithium; Nsaids; and Risperidone and related  Home Medications   Prior to Admission medications   Medication Sig Start Date End Date Taking? Authorizing Provider  mesalamine (LIALDA) 1.2 G EC tablet Take 2 tablets (2.4 g total) by mouth 2 (two) times daily. 05/25/14   Carlisle Cater, PA-C  OLANZapine zydis (ZYPREXA) 10 MG disintegrating tablet Take 10 mg by mouth at bedtime.    Historical Provider, MD  oxyCODONE-acetaminophen (PERCOCET/ROXICET) 5-325 MG per tablet Take 1-2 tablets by mouth every 6 (six) hours as needed for severe pain. 05/25/14  Carlisle Cater, PA-C  predniSONE (DELTASONE) 10 MG tablet Take 20m PO x 10 days, then 34mPO x 10 days, then 2021mO x 10 days, then 81m67m x 10 days 05/25/14   JoshCarlisle Cater-C   BP 140/82  Pulse 115  Temp(Src) 98.8 F (37.1 C)  Resp 20  SpO2 99% Physical Exam  Nursing note and vitals reviewed. Constitutional: He appears well-developed and well-nourished.  HENT:  Head: Normocephalic and atraumatic.  Mouth/Throat: Oropharynx is clear and moist.  Eyes: EOM are normal.  Neck: Normal range of motion. Neck supple.  Cardiovascular: Regular rhythm and normal heart  sounds.  Tachycardia present.   Pulmonary/Chest: Effort normal and breath sounds normal.  Abdominal: Soft. Bowel sounds are normal. He exhibits no distension and no mass. There is tenderness. There is no rebound and no guarding.  Mild generalized abdominal tenderness to palpation.  No rebound or guarding.  Musculoskeletal: Normal range of motion.  Neurological: He is alert.  Skin: Skin is warm and dry.  Psychiatric: He has a normal mood and affect.    ED Course  Procedures (including critical care time) Labs Review Labs Reviewed  CBC WITH DIFFERENTIAL  COMPREHENSIVE METABOLIC PANEL  LIPASE, BLOOD  URINALYSIS, ROUTINE W REFLEX MICROSCOPIC    Imaging Review Ct Abdomen Pelvis W Contrast  07/26/2014   CLINICAL DATA:  Bilateral mid epigastric pain, bilateral periumbilical pain, nausea and vomiting for 3 days, history of ulcerative colitis  EXAM: CT ABDOMEN AND PELVIS WITH CONTRAST  TECHNIQUE: Multidetector CT imaging of the abdomen and pelvis was performed using the standard protocol following bolus administration of intravenous contrast.  CONTRAST:  100mL47mIPAQUE IOHEXOL 300 MG/ML  SOLN  COMPARISON:  03/03/2014  FINDINGS: Lung bases are unremarkable. Sagittal images of the spine are unremarkable. There are motion artifacts. Liver, pancreas, spleen and adrenal glands are unremarkable.  Kidneys are symmetrical in size and enhancement. No hydronephrosis or hydroureter.  No small bowel obstruction.  No ascites or free air. No adenopathy. Moderate stool is noted in right colon. There is normal appendix. No pericecal inflammation.  The terminal ileum is unremarkable.  Again noted mild enlarged mesenteric and retroperitoneal lymph nodes. A left para-aortic lymph node axial image 60 measures 1.2 by 1.1 cm without change from prior exam. Mesenteric lymph nodes are again noted. The largest in left lower quadrant mesentery axial image 73 measures 1.3 by 1 cm without significant change from prior exam.   There is segmental mild thickening of distal left colon wall axial image 65 -71. Findings may be due to chronic ulcerative colitis. Again noted segmental thickening of distal sigmoid colon wall with narrowing of the lumen. Seen in axial images 84-90. This is probable sequela post chronic ulcerative colitis. No definite evidence of acute colonic obstruction. No acute pericolonic inflammation. No definite evidence of colonic mass. Prostate gland and seminal vesicles are unremarkable. No inguinal adenopathy.  IMPRESSION: 1. No acute inflammatory process within abdomen. 2. No small bowel obstruction. 3. Normal appendix.  No pericecal inflammation. 4. Stable mild enlarged retroperitoneal and mesenteric lymph nodes probable reactive. 5. There is segmental thickening of distal left colon wall without evidence of acute pericolonic inflammation. Findings may be due to chronic ulcerative colitis. Stable segmental thickening of distal sigmoid colon wall with narrowing of the lumen probable sequela from ulcerative colitis. No definite evidence of colonic obstruction. No definite evidence of colonic mass. 6. No hydronephrosis or hydroureter.   Electronically Signed   By: LiviuOrlean Bradford  On: 07/26/2014 15:45     EKG Interpretation None     4:01 PM Patient tolerating PO liquids. MDM   Final diagnoses:  None   Patient with a history of Ulcerative Colitis presents today with generalized abdominal pain, nausea, vomiting, and diarrhea for the past 3 days.  Labs today unremarkable aside from mild leukocytosis and mild hypokalemia.  Patient given Potassium in the ED.  Patient is afebrile and non toxic appearing.  CT ab/pelvis without acute findings.  Hemoccult is negative.  No episodes of vomiting or diarrhea during ED course.  Patient tolerating PO liquids.  Patient stable for discharge.  Review of the chart states that GI has recommended Lialda 2.4 mg BID in the past.  Patient given a prescription for this.  Patient  instructed to continue the Prednisone.  Instructed the patient to follow up with GI.  Return precautions given.    Hyman Bible, PA-C 07/26/14 1621

## 2014-07-26 NOTE — Progress Notes (Signed)
Patient arrived to CT with 20 g IV in top of left hand that was not completely advanced- (Approximately 1/2 the catheter was exposed and not in the patient's vein.) Not suitable for contrast injection- A new #22g Nexiva cath placed x 2 attempts in the top of the right hand and left in place post-CT scan.

## 2014-07-26 NOTE — ED Provider Notes (Signed)
Medical screening examination/treatment/procedure(s) were performed by non-physician practitioner and as supervising physician I was immediately available for consultation/collaboration.   EKG Interpretation None       Ezequiel Essex, MD 07/26/14 [phone]

## 2014-08-07 ENCOUNTER — Encounter (HOSPITAL_COMMUNITY): Payer: Self-pay | Admitting: Emergency Medicine

## 2014-08-07 ENCOUNTER — Emergency Department (HOSPITAL_COMMUNITY)
Admission: EM | Admit: 2014-08-07 | Discharge: 2014-08-07 | Disposition: A | Discharge status: Home / Self Care | Payer: Medicare Other | Attending: Emergency Medicine | Admitting: Emergency Medicine

## 2014-08-07 DIAGNOSIS — K5090 Crohn's disease, unspecified, without complications: Secondary | ICD-10-CM | POA: Diagnosis not present

## 2014-08-07 DIAGNOSIS — B349 Viral infection, unspecified: Secondary | ICD-10-CM | POA: Insufficient documentation

## 2014-08-07 DIAGNOSIS — F209 Schizophrenia, unspecified: Secondary | ICD-10-CM | POA: Insufficient documentation

## 2014-08-07 DIAGNOSIS — J029 Acute pharyngitis, unspecified: Secondary | ICD-10-CM | POA: Diagnosis present

## 2014-08-07 DIAGNOSIS — Z79899 Other long term (current) drug therapy: Secondary | ICD-10-CM | POA: Insufficient documentation

## 2014-08-07 DIAGNOSIS — Z720 Tobacco use: Secondary | ICD-10-CM | POA: Insufficient documentation

## 2014-08-07 LAB — RAPID STREP SCREEN (MED CTR MEBANE ONLY): STREPTOCOCCUS, GROUP A SCREEN (DIRECT): NEGATIVE

## 2014-08-07 MED ORDER — GUAIFENESIN-CODEINE 100-10 MG/5ML PO SOLN: 10.0000 mL | Freq: Three times a day (TID) | ORAL | Status: DC | PRN

## 2014-08-07 MED ORDER — MAGIC MOUTHWASH
10.0000 mL | Freq: Once | ORAL | Status: AC
  Administered 2014-08-07: 10 mL via ORAL
  Filled 2014-08-07: qty 10

## 2014-08-07 NOTE — ED Provider Notes (Signed)
CSN: [id number]     Arrival date & time 08/07/14  1103 History   First MD Initiated Contact with Patient 08/07/14 1050     Chief Complaint  Patient presents with  . Lymphadenopathy  . Sore Throat     (Consider location/radiation/quality/duration/timing/severity/associated sxs/prior Treatment) HPI  33 year old male with history of schizophrenia, Crohn's disease who presents complaining of sore throat and neck pain. Patient reports for the past week he has had persistent nasal congestion, runny nose, left ear pain, sore throat, but most significantly having enlarged lymph nodes in his neck. Neck pain is worsened with palpation and lymph node is getting increasingly soft. He reports having night sweats. He reports chills but denies fever, severe headache, neck stiffness, chest pain, shortness of breath, productive cough, abnormal weight changes, or history of cancer. No specific treatment tried. No voice changes. No recent sick contact.  Past Medical History  Diagnosis Date  . Ulcerative colitis   . Schizophrenia   . GI (gastrointestinal bleed) 04/2013  . Crohn's disease   . Suicide attempt by drug ingestion    Past Surgical History  Procedure Laterality Date  . Mandible fracture surgery    . Flexible sigmoidoscopy  12/05/2011    Procedure: FLEXIBLE SIGMOIDOSCOPY;  Surgeon: Beryle Beams, MD;  Location: [facility] ENDOSCOPY;  Service: Endoscopy;  Laterality: N/A;  . Esophagogastroduodenoscopy N/A 05/24/2013    Procedure: ESOPHAGOGASTRODUODENOSCOPY (EGD);  Surgeon: Ladene Artist, MD;  Location: [facility] ENDOSCOPY;  Service: Endoscopy;  Laterality: N/A;   Family History  Problem Relation Age of Onset  . Liver cancer     History  Substance Use Topics  . Smoking status: Current Every Day Smoker -- 0.25 packs/day for 20 years    Types: Cigarettes  . Smokeless tobacco: Never Used  . Alcohol Use: Yes     Comment: occasional    Review of Systems  All other systems reviewed and are  negative.     Allergies  Depakote; Lithium; Nsaids; and Risperidone and related  Home Medications   Prior to Admission medications   Medication Sig Start Date End Date Taking? Authorizing Provider  mesalamine (LIALDA) 1.2 G EC tablet Take 2 tablets (2.4 g total) by mouth 2 (two) times daily. 07/26/14   Heather Laisure, PA-C  OLANZapine zydis (ZYPREXA) 10 MG disintegrating tablet Take 10 mg by mouth at bedtime.    Historical Provider, MD  ondansetron (ZOFRAN) 4 MG tablet Take 1 tablet (4 mg total) by mouth every 6 (six) hours. 07/26/14   Heather Laisure, PA-C  ondansetron (ZOFRAN) 4 MG tablet Take 1 tablet (4 mg total) by mouth every 6 (six) hours. 07/26/14   Heather Laisure, PA-C   BP 113/69  Pulse 102  Temp(Src) 97.5 F (36.4 C) (Oral)  Resp 15  Ht 6' (1.829 m)  Wt 225 lb 3 oz (102.144 kg)  BMI 30.53 kg/m2  SpO2 96% Physical Exam  Constitutional: He is oriented to person, place, and time. He appears well-developed and well-nourished. No distress.  HENT:  Head: Normocephalic and atraumatic.  Right Ear: External ear normal.  Left Ear: External ear normal.  Ears: TMs normal bilaterally.  Nose: Normal nares  Throat: Uvula is midline, mild posterior oropharyngeal erythema without any significant tonsillar enlargement exudates. No trismus.   Eyes: Conjunctivae are normal.  Neck: Normal range of motion. Neck supple.  No nuchal rigidity  Cardiovascular:  Mild tachycardia without murmur rubs or gallop  Pulmonary/Chest: Effort normal and breath sounds normal. No respiratory distress. He exhibits no  tenderness.  Abdominal: Soft. There is no tenderness.  Lymphadenopathy:    He has cervical adenopathy (multiple large reactive lymph nodes to anterior cervical chain, ttp.  no abscess or cellulitis.  ).  Neurological: He is alert and oriented to person, place, and time.  Skin: No rash noted.  Psychiatric: He has a normal mood and affect.    ED Course  Procedures (including  critical care time)  11:04 AM Patient presents with URI symptoms. His throat exam is unremarkable however he has impressive reactive anterior cervical lymph nodes that is tender to palpation.  Will obtain Rapid strep test.  Heart/Lungs exam are unremarkable.    11:40 AM Strep test is negative, culture sent.  I suspect he has a viral infection such as mononucleosis. Given the prolonged duration of the symptoms, I do not think [facility] will change treatment modality.  No splenomegaly on exam.  Plan to treat sxs, recommend avoid activity which can cause abdominal injury in the event that he does have splenomegaly.  Will provide ENT referral as needed, return precaution discussed.  Care discussed with Dr. Stevie Kern.    Labs Review Labs Reviewed  RAPID STREP SCREEN  CULTURE, GROUP A STREP    Imaging Review No results found.   EKG Interpretation None      MDM   Final diagnoses:  Viral syndrome    BP 113/69  Pulse 102  Temp(Src) 97.5 F (36.4 C) (Oral)  Resp 15  Ht 6' (1.829 m)  Wt 225 lb 3 oz (102.144 kg)  BMI 30.53 kg/m2  SpO2 96%     Domenic Moras, PA-C 08/07/14 1145

## 2014-08-07 NOTE — Discharge Instructions (Signed)
Your neck pain is due to enlarge reactive lymph nodes, likely from a viral infection such as mono.  Take medication as prescribed.  Gargle with salt water several times daily.  Follow up with ENT provider if you notice no improvement.    Viral Infections A viral infection can be caused by different types of viruses.Most viral infections are not serious and resolve on their own. However, some infections may cause severe symptoms and may lead to further complications. SYMPTOMS Viruses can frequently cause:  Minor sore throat.  Aches and pains.  Headaches.  Runny nose.  Different types of rashes.  Watery eyes.  Tiredness.  Cough.  Loss of appetite.  Gastrointestinal infections, resulting in nausea, vomiting, and diarrhea. These symptoms do not respond to antibiotics because the infection is not caused by bacteria. However, you might catch a bacterial infection following the viral infection. This is sometimes called a "superinfection." Symptoms of such a bacterial infection may include:  Worsening sore throat with pus and difficulty swallowing.  Swollen neck glands.  Chills and a high or persistent fever.  Severe headache.  Tenderness over the sinuses.  Persistent overall ill feeling (malaise), muscle aches, and tiredness (fatigue).  Persistent cough.  Yellow, green, or brown mucus production with coughing. HOME CARE INSTRUCTIONS   Only take over-the-counter or prescription medicines for pain, discomfort, diarrhea, or fever as directed by your caregiver.  Drink enough water and fluids to keep your urine clear or pale yellow. Sports drinks can provide valuable electrolytes, sugars, and hydration.  Get plenty of rest and maintain proper nutrition. Soups and broths with crackers or rice are fine. SEEK IMMEDIATE MEDICAL CARE IF:   You have severe headaches, shortness of breath, chest pain, neck pain, or an unusual rash.  You have uncontrolled vomiting, diarrhea, or you  are unable to keep down fluids.  You or your child has an oral temperature above 102 F (38.9 C), not controlled by medicine.  Your baby is older than 3 months with a rectal temperature of 102 F (38.9 C) or higher.  Your baby is 24 months old or younger with a rectal temperature of 100.4 F (38 C) or higher. MAKE SURE YOU:   Understand these instructions.  Will watch your condition.  Will get help right away if you are not doing well or get worse. Document Released: 07/12/2005 Document Revised: 12/25/2011 Document Reviewed: 02/06/2011 [facility] Patient Information 2015 [place], [place]. This information is not intended to replace advice given to you by your health care provider. Make sure you discuss any questions you have with your health care provider.

## 2014-08-07 NOTE — ED Notes (Signed)
Pt states he began having swollen glands on both sides of his neck last week.  R > L.  Pt states it is painful.  No fever.  Pt able to talk and swallow.  Some redness noted to throat.

## 2014-08-09 LAB — CULTURE, GROUP A STREP

## 2014-08-14 NOTE — ED Provider Notes (Signed)
Medical screening examination/treatment/procedure(s) were performed by non-physician practitioner and as supervising physician I was immediately available for consultation/collaboration.   EKG Interpretation None       Babette Relic, MD 08/14/14 [phone]

## 2014-08-27 ENCOUNTER — Encounter (HOSPITAL_COMMUNITY): Payer: Self-pay

## 2014-08-27 ENCOUNTER — Emergency Department (HOSPITAL_COMMUNITY)
Admission: EM | Admit: 2014-08-27 | Discharge: 2014-08-27 | Disposition: A | Discharge status: Home / Self Care | Payer: Medicare Other | Attending: Emergency Medicine | Admitting: Emergency Medicine

## 2014-08-27 DIAGNOSIS — K5090 Crohn's disease, unspecified, without complications: Secondary | ICD-10-CM | POA: Insufficient documentation

## 2014-08-27 DIAGNOSIS — Z720 Tobacco use: Secondary | ICD-10-CM | POA: Diagnosis not present

## 2014-08-27 DIAGNOSIS — F209 Schizophrenia, unspecified: Secondary | ICD-10-CM | POA: Diagnosis not present

## 2014-08-27 DIAGNOSIS — R591 Generalized enlarged lymph nodes: Secondary | ICD-10-CM

## 2014-08-27 DIAGNOSIS — R21 Rash and other nonspecific skin eruption: Secondary | ICD-10-CM | POA: Diagnosis not present

## 2014-08-27 DIAGNOSIS — Z8719 Personal history of other diseases of the digestive system: Secondary | ICD-10-CM | POA: Insufficient documentation

## 2014-08-27 DIAGNOSIS — Z87828 Personal history of other (healed) physical injury and trauma: Secondary | ICD-10-CM | POA: Insufficient documentation

## 2014-08-27 LAB — CBC WITH DIFFERENTIAL/PLATELET
BASOS PCT: 1 % (ref 0–1)
Basophils Absolute: 0.1 10*3/uL (ref 0.0–0.1)
EOS PCT: 3 % (ref 0–5)
Eosinophils Absolute: 0.3 10*3/uL (ref 0.0–0.7)
HEMATOCRIT: 35.9 % — AB (ref 39.0–52.0)
HEMOGLOBIN: 11 g/dL — AB (ref 13.0–17.0)
LYMPHS PCT: 23 % (ref 12–46)
Lymphs Abs: 2 10*3/uL (ref 0.7–4.0)
MCH: 22.1 pg — ABNORMAL LOW (ref 26.0–34.0)
MCHC: 30.6 g/dL (ref 30.0–36.0)
MCV: 72.1 fL — ABNORMAL LOW (ref 78.0–100.0)
MONOS PCT: 7 % (ref 3–12)
Monocytes Absolute: 0.6 10*3/uL (ref 0.1–1.0)
NEUTROS ABS: 5.7 10*3/uL (ref 1.7–7.7)
Neutrophils Relative %: 66 % (ref 43–77)
Platelets: 490 10*3/uL — ABNORMAL HIGH (ref 150–400)
RBC: 4.98 MIL/uL (ref 4.22–5.81)
RDW: 19.7 % — ABNORMAL HIGH (ref 11.5–15.5)
WBC: 8.7 10*3/uL (ref 4.0–10.5)

## 2014-08-27 LAB — BASIC METABOLIC PANEL
Anion gap: 15 (ref 5–15)
BUN: 5 mg/dL — AB (ref 6–23)
CHLORIDE: 101 meq/L (ref 96–112)
CO2: 23 mEq/L (ref 19–32)
Calcium: 9.3 mg/dL (ref 8.4–10.5)
Creatinine, Ser: 0.75 mg/dL (ref 0.50–1.35)
GFR calc Af Amer: >90 mL/min (ref >90)
GFR calc non Af Amer: >90 mL/min (ref >90)
Glucose, Bld: 92 mg/dL (ref 70–99)
POTASSIUM: 3.9 meq/L (ref 3.7–5.3)
Sodium: 139 mEq/L (ref 137–147)

## 2014-08-27 LAB — SEDIMENTATION RATE: Sed Rate: 55 mm/hr — ABNORMAL HIGH (ref 0–16)

## 2014-08-27 NOTE — ED Notes (Signed)
Contacted lab regarding delay in Sed Rate result. EDPA updated.

## 2014-08-27 NOTE — Discharge Instructions (Signed)
You may take acetaminophen (tylenol) and ibuprofen (motrin) as needed for pain.  See below for further instructions.

## 2014-08-27 NOTE — ED Notes (Signed)
Pt has a rash to bilateral hands and has a lump on the right side of his neck for the past 2 weeks. Has been seen for that but it is still there.

## 2014-08-27 NOTE — ED Provider Notes (Signed)
  Face-to-face evaluation   History: he is here for evaluation of sores.  Neck, and rash. Present for 2-3 weeks.  Rash started today.  No fever, chills, nausea, vomiting, weakness, or dizziness.  He has a history of chronic stable Crohn's.  He does not have a primary care doctor.  Physical exam:alert, calm, cooperative.  Anterior neck, with bilateral adenopathy, slightly tender, multiple nodes greater than 1 cm bilateral.  He also has small adenopathy left axilla, and right inguinal region.  Abdomen is nontender to palpation.  Skin he has a few scattered red papules of the dorsum of both hands.  These are nonspecific in appearance.  Genitals- Normal appearing external male genitalia.  Assessment- nonspecific adenopathy, without systemic or toxic symptoms.  This does not appear to be a process isolated to the oropharynx.  Medical screening examination/treatment/procedure(s) were conducted as a shared visit with non-physician practitioner(s) and myself.  I personally evaluated the patient during the encounter  Richarda Blade, MD 08/27/14 1718

## 2014-08-27 NOTE — ED Notes (Signed)
Lab called and states not enough blood to run sed rate, new orders put in and blood work to be redrawn.

## 2014-08-27 NOTE — ED Provider Notes (Signed)
CSN: [id number]     Arrival date & time 08/27/14  1031 History   This chart is scribed for non-physician practitioner, Keith Fordyce, PA-C, working with Richarda Blade, MD by Chester Holstein, ED Scribe.  This patient was seen in room TR06C/TR06C and the patient's care was started 12:05 PM.    Chief Complaint  Patient presents with  . Rash  . Cyst     The history is provided by the patient. No language interpreter was used.    HPI Comments: [name] is a 33 y.o. male with chronic Crohn's disease who presents to the Emergency Department complaining of a rash on his bilateral hands and arms with onset 3 days ago, and minimal achy sore pain from a lump on his neck with onset 3 weeks ago.  Pt states there is no associated pain with the rash and it is not itchy.  He notes a mild sore throat.  Pt was previously seen in ED 3 weeks ago for similar symptoms and was referred to an ENT but did not follow-up. He denies any nausea or vomiting.     Past Medical History  Diagnosis Date  . Ulcerative colitis   . Schizophrenia   . GI (gastrointestinal bleed) 04/2013  . Crohn's disease   . Suicide attempt by drug ingestion    Past Surgical History  Procedure Laterality Date  . Mandible fracture surgery    . Flexible sigmoidoscopy  12/05/2011    Procedure: FLEXIBLE SIGMOIDOSCOPY;  Surgeon: Beryle Beams, MD;  Location: [facility] ENDOSCOPY;  Service: Endoscopy;  Laterality: N/A;  . Esophagogastroduodenoscopy N/A 05/24/2013    Procedure: ESOPHAGOGASTRODUODENOSCOPY (EGD);  Surgeon: Ladene Artist, MD;  Location: [facility] ENDOSCOPY;  Service: Endoscopy;  Laterality: N/A;   Family History  Problem Relation Age of Onset  . Liver cancer     History  Substance Use Topics  . Smoking status: Current Every Day Smoker -- 0.25 packs/day for 20 years    Types: Cigarettes  . Smokeless tobacco: Never Used  . Alcohol Use: Yes     Comment: occasional    Review of Systems  Constitutional: Negative for fever.  HENT:  Positive for sore throat.   Gastrointestinal: Negative for nausea and vomiting.  Skin: Positive for rash.      Allergies  Depakote; Lithium; Nsaids; and Risperidone and related  Home Medications   Prior to Admission medications   Medication Sig Start Date End Date Taking? Authorizing Provider  mesalamine (LIALDA) 1.2 G EC tablet Take 2 tablets (2.4 g total) by mouth 2 (two) times daily. 07/26/14  Yes Heather Laisure, PA-C  OLANZapine zydis (ZYPREXA) 10 MG disintegrating tablet Take 10 mg by mouth at bedtime.   Yes Historical Provider, MD  ondansetron (ZOFRAN) 4 MG tablet Take 1 tablet (4 mg total) by mouth every 6 (six) hours. Patient taking differently: Take 4 mg by mouth every 6 (six) hours as needed.  07/26/14  Yes Heather Laisure, PA-C  guaiFENesin-codeine 100-10 MG/5ML syrup Take 10 mLs by mouth 3 (three) times daily as needed for cough. Patient not taking: Reported on 08/27/2014 08/07/14   Domenic Moras, PA-C   BP 133/74 mmHg  Pulse 87  Temp(Src) 98.3 F (36.8 C) (Oral)  Resp 18  SpO2 99% Physical Exam  Constitutional: He is oriented to person, place, and time. He appears well-developed and well-nourished.  HENT:  Head: Normocephalic and atraumatic.  Eyes: EOM are normal.  Neck: Normal range of motion.  Bilateral anterior cervical adenopathy, nodes >1cm.  Mild tenderness. No erythema or warmth.  Cardiovascular: Normal rate, regular rhythm and normal heart sounds.   Pulmonary/Chest: Effort normal and breath sounds normal. No stridor. No respiratory distress. He has no wheezes. He has no rales. He exhibits no tenderness.  Abdominal: Soft. Bowel sounds are normal. He exhibits no distension and no mass. There is no tenderness. There is no rebound and no guarding.  Musculoskeletal: Normal range of motion.  Lymphadenopathy:    He has cervical adenopathy.    He has axillary adenopathy.       Left axillary: Lateral adenopathy present.       Right: Inguinal adenopathy present.   Neurological: He is alert and oriented to person, place, and time.  Skin: Skin is warm and dry. There is erythema.  Diffuse papular erythematous rash on dorsal aspect of right and left hand, and left AC. Dried appearing skin over lesions. Non-tender. No discharge or bleeding. No evidence of underlying infection of rash.  Psychiatric: He has a normal mood and affect. His behavior is normal.  Nursing note and vitals reviewed.   ED Course  Procedures (including critical care time) DIAGNOSTIC STUDIES: Oxygen Saturation is 97% on room air, normal by my interpretation.    COORDINATION OF CARE: 12:08 PM Discussed treatment plan with patient at beside, the patient agrees with the plan and has no further questions at this time.   Labs Review Labs Reviewed  CBC WITH DIFFERENTIAL - Abnormal; Notable for the following:    Hemoglobin 11.0 (*)    HCT 35.9 (*)    MCV 72.1 (*)    MCH 22.1 (*)    RDW 19.7 (*)    Platelets 490 (*)    All other components within normal limits  BASIC METABOLIC PANEL - Abnormal; Notable for the following:    BUN 5 (*)    All other components within normal limits  SEDIMENTATION RATE - Abnormal; Notable for the following:    Sed Rate 55 (*)    All other components within normal limits    Imaging Review No results found.   EKG Interpretation None      MDM   Final diagnoses:  Lymphadenopathy  Rash   Pt presenting to ED with c/o persistent lump in right side of his neck that started 3 week ago. Seen at that time and dx with a viral syndrome.  Today, pt c/o nondescript rash on hands and left arm, no pain or itching. Discussed pt with Dr. Eulis Foster who also examined pt.  Pt found to have multiple areas of lymphadenopathy.  Labs: CBC, BMP, and Sed Rate ordered- unremarkable. Pt may be discharged home to f/u with his PCP at [facility] for further evaluation of lymphadenopathy. Pt verbalized understanding and agreement with tx plan.   I personally performed the  services described in this documentation, which was scribed in my presence. The recorded information has been reviewed and is accurate.     Keith Fordyce, PA-C 08/27/14 [place], MD 08/27/14 [phone]

## 2014-08-27 NOTE — ED Notes (Signed)
Pt comfortable with discharge and follow up instructions. Pt declines wheelchair, escorted to waiting area by this RN. No prescriptions.

## 2015-03-02 DIAGNOSIS — F209 Schizophrenia, unspecified: Secondary | ICD-10-CM | POA: Diagnosis not present

## 2015-03-04 DIAGNOSIS — F209 Schizophrenia, unspecified: Secondary | ICD-10-CM | POA: Diagnosis not present

## 2015-03-21 ENCOUNTER — Encounter (HOSPITAL_COMMUNITY): Payer: Self-pay | Admitting: Emergency Medicine

## 2015-03-21 ENCOUNTER — Emergency Department (HOSPITAL_COMMUNITY)
Admission: EM | Admit: 2015-03-21 | Discharge: 2015-03-22 | Disposition: A | Discharge status: Home / Self Care | Payer: Medicare Other | Attending: Emergency Medicine | Admitting: Emergency Medicine

## 2015-03-21 DIAGNOSIS — Z8659 Personal history of other mental and behavioral disorders: Secondary | ICD-10-CM | POA: Insufficient documentation

## 2015-03-21 DIAGNOSIS — Z720 Tobacco use: Secondary | ICD-10-CM | POA: Insufficient documentation

## 2015-03-21 DIAGNOSIS — Z79899 Other long term (current) drug therapy: Secondary | ICD-10-CM | POA: Insufficient documentation

## 2015-03-21 DIAGNOSIS — Z862 Personal history of diseases of the blood and blood-forming organs and certain disorders involving the immune mechanism: Secondary | ICD-10-CM | POA: Insufficient documentation

## 2015-03-21 DIAGNOSIS — R197 Diarrhea, unspecified: Secondary | ICD-10-CM | POA: Insufficient documentation

## 2015-03-21 DIAGNOSIS — Z85820 Personal history of malignant melanoma of skin: Secondary | ICD-10-CM | POA: Insufficient documentation

## 2015-03-21 DIAGNOSIS — Z8719 Personal history of other diseases of the digestive system: Secondary | ICD-10-CM | POA: Insufficient documentation

## 2015-03-21 DIAGNOSIS — R1084 Generalized abdominal pain: Secondary | ICD-10-CM | POA: Diagnosis not present

## 2015-03-21 DIAGNOSIS — R112 Nausea with vomiting, unspecified: Secondary | ICD-10-CM | POA: Insufficient documentation

## 2015-03-21 DIAGNOSIS — R1110 Vomiting, unspecified: Secondary | ICD-10-CM | POA: Diagnosis not present

## 2015-03-21 DIAGNOSIS — R5383 Other fatigue: Secondary | ICD-10-CM | POA: Diagnosis not present

## 2015-03-21 LAB — CBC WITH DIFFERENTIAL/PLATELET
Basophils Absolute: 0.1 10*3/uL (ref 0.0–0.1)
Basophils Relative: 0 % (ref 0–1)
EOS PCT: 5 % (ref 0–5)
Eosinophils Absolute: 0.7 10*3/uL (ref 0.0–0.7)
HEMATOCRIT: 46.1 % (ref 39.0–52.0)
HEMOGLOBIN: 15.5 g/dL (ref 13.0–17.0)
Lymphocytes Relative: 14 % (ref 12–46)
Lymphs Abs: 2 10*3/uL (ref 0.7–4.0)
MCH: 26.9 pg (ref 26.0–34.0)
MCHC: 33.6 g/dL (ref 30.0–36.0)
MCV: 79.9 fL (ref 78.0–100.0)
MONO ABS: 1.7 10*3/uL — AB (ref 0.1–1.0)
MONOS PCT: 12 % (ref 3–12)
Neutro Abs: 10 10*3/uL — ABNORMAL HIGH (ref 1.7–7.7)
Neutrophils Relative %: 69 % (ref 43–77)
Platelets: 359 10*3/uL (ref 150–400)
RBC: 5.77 MIL/uL (ref 4.22–5.81)
RDW: 17.2 % — ABNORMAL HIGH (ref 11.5–15.5)
WBC: 14.4 10*3/uL — ABNORMAL HIGH (ref 4.0–10.5)

## 2015-03-21 LAB — I-STAT CHEM 8, ED
BUN: 18 mg/dL (ref 6–20)
CHLORIDE: 102 mmol/L (ref 101–111)
Calcium, Ion: 1.14 mmol/L (ref 1.12–1.23)
Creatinine, Ser: 1.1 mg/dL (ref 0.61–1.24)
GLUCOSE: 151 mg/dL — AB (ref 65–99)
HEMATOCRIT: 51 % (ref 39.0–52.0)
Hemoglobin: 17.3 g/dL — ABNORMAL HIGH (ref 13.0–17.0)
POTASSIUM: 3.5 mmol/L (ref 3.5–5.1)
Sodium: 142 mmol/L (ref 135–145)
TCO2: 26 mmol/L (ref 0–100)

## 2015-03-21 LAB — URINE MICROSCOPIC-ADD ON

## 2015-03-21 LAB — URINALYSIS, ROUTINE W REFLEX MICROSCOPIC
Glucose, UA: 250 mg/dL — AB
Hgb urine dipstick: NEGATIVE
KETONES UR: 15 mg/dL — AB
Leukocytes, UA: NEGATIVE
NITRITE: NEGATIVE
Protein, ur: 100 mg/dL — AB
Specific Gravity, Urine: 1.037 — ABNORMAL HIGH (ref 1.005–1.030)
Urobilinogen, UA: 1 mg/dL (ref 0.0–1.0)
pH: 5.5 (ref 5.0–8.0)

## 2015-03-21 LAB — RAPID STREP SCREEN (MED CTR MEBANE ONLY): STREPTOCOCCUS, GROUP A SCREEN (DIRECT): NEGATIVE

## 2015-03-21 MED ORDER — METHYLPREDNISOLONE SODIUM SUCC 125 MG IJ SOLR
125.0000 mg | Freq: Once | INTRAMUSCULAR | Status: AC
Start: 2015-03-22 — End: 2015-03-22
  Administered 2015-03-22: 125 mg via INTRAVENOUS
  Filled 2015-03-21: qty 2

## 2015-03-21 MED ORDER — ONDANSETRON HCL 4 MG/2ML IJ SOLN
4.0000 mg | Freq: Once | INTRAMUSCULAR | Status: AC
  Administered 2015-03-22: 4 mg via INTRAVENOUS
  Filled 2015-03-21: qty 2

## 2015-03-21 MED ORDER — MORPHINE SULFATE 4 MG/ML IJ SOLN
4.0000 mg | Freq: Once | INTRAMUSCULAR | Status: AC
  Administered 2015-03-22: 4 mg via INTRAVENOUS
  Filled 2015-03-21: qty 1

## 2015-03-21 MED ORDER — SODIUM CHLORIDE 0.9 % IV BOLUS (SEPSIS)
1000.0000 mL | Freq: Once | INTRAVENOUS | Status: AC
  Administered 2015-03-22: 1000 mL via INTRAVENOUS

## 2015-03-21 NOTE — ED Notes (Addendum)
Patient with vomiting and fatigue for the last three days.  Patient states that he is feeling weak, does have abdominal pain and nausea, with diarrhea.  Patient complains of sore throat also.

## 2015-03-21 NOTE — ED Provider Notes (Signed)
CSN: [id number]     Arrival date & time 03/21/15  1957 History   First MD Initiated Contact with Patient 03/21/15 2331     Chief Complaint  Patient presents with  . Emesis  . Fatigue     (Consider location/radiation/quality/duration/timing/severity/associated sxs/prior Treatment) The history is provided by the patient and medical records. No language interpreter was used.     [name] is a 34 y.o. male  with a hx of Ulcerative colitis, schizophrenia, Crohns disease, presents to the Emergency Department complaining of gradual, persistent, progressively worsening generalized abdominal pain with assocaited nausea and vomiting onset 2 days ago.  Pt reports he subsequently developed laryngitis from all the vomiting.  Emesis is NBNB and diarrhea without melena or hematochezia.  Pt denies travel in the last 30 days.  He reports the pain is throbbing and sharp, rated at an 8/10 and nonradiating.  Nothing makes it better and eating and drinking makes it worse.  Pt denies fever, chills, headache, neck pain, chest pain, SOB, weakness, dizziness, syncope, dysuria.  .     Past Medical History  Diagnosis Date  . Ulcerative colitis   . Schizophrenia   . GI (gastrointestinal bleed) 04/2013  . Crohn's disease   . Suicide attempt by drug ingestion    Past Surgical History  Procedure Laterality Date  . Mandible fracture surgery    . Flexible sigmoidoscopy  12/05/2011    Procedure: FLEXIBLE SIGMOIDOSCOPY;  Surgeon: Beryle Beams, MD;  Location: [facility] ENDOSCOPY;  Service: Endoscopy;  Laterality: N/A;  . Esophagogastroduodenoscopy N/A 05/24/2013    Procedure: ESOPHAGOGASTRODUODENOSCOPY (EGD);  Surgeon: Ladene Artist, MD;  Location: [facility] ENDOSCOPY;  Service: Endoscopy;  Laterality: N/A;   Family History  Problem Relation Age of Onset  . Liver cancer     History  Substance Use Topics  . Smoking status: Current Every Day Smoker -- 0.25 packs/day for 20 years    Types: Cigarettes  . Smokeless tobacco:  Never Used  . Alcohol Use: Yes     Comment: occasional    Review of Systems  Constitutional: Negative for fever, diaphoresis, appetite change, fatigue and unexpected weight change.  HENT: Positive for voice change. Negative for mouth sores.   Eyes: Negative for visual disturbance.  Respiratory: Negative for cough, chest tightness, shortness of breath and wheezing.   Cardiovascular: Negative for chest pain.  Gastrointestinal: Positive for nausea, vomiting, abdominal pain and diarrhea. Negative for constipation.  Endocrine: Negative for polydipsia, polyphagia and polyuria.  Genitourinary: Negative for dysuria, urgency, frequency and hematuria.  Musculoskeletal: Negative for back pain and neck stiffness.  Skin: Negative for rash.  Allergic/Immunologic: Negative for immunocompromised state.  Neurological: Negative for syncope, light-headedness and headaches.  Hematological: Does not bruise/bleed easily.  Psychiatric/Behavioral: Negative for sleep disturbance. The patient is not nervous/anxious.       Allergies  Depakote; Lithium; Nsaids; and Risperidone and related  Home Medications   Prior to Admission medications   Medication Sig Start Date End Date Taking? Authorizing Provider  guaiFENesin-codeine 100-10 MG/5ML syrup Take 10 mLs by mouth 3 (three) times daily as needed for cough. Patient not taking: Reported on 08/27/2014 08/07/14   Domenic Moras, PA-C  HYDROcodone-acetaminophen (NORCO/VICODIN) 5-325 MG per tablet Take 1-2 tablets by mouth every 6 (six) hours as needed for moderate pain or severe pain. 03/22/15   Kolina Kube, PA-C  mesalamine (LIALDA) 1.2 G EC tablet Take 2 tablets (2.4 g total) by mouth 2 (two) times daily. 07/26/14   Hyman Bible, PA-C  OLANZapine zydis (ZYPREXA) 10 MG disintegrating tablet Take 10 mg by mouth at bedtime.    Historical Provider, MD  ondansetron (ZOFRAN ODT) 8 MG disintegrating tablet 40m ODT q4 hours prn nausea 03/22/15   HJarrett SohoMuthersbaugh,  PA-C  ondansetron (ZOFRAN) 4 MG tablet Take 1 tablet (4 mg total) by mouth every 6 (six) hours. Patient taking differently: Take 4 mg by mouth every 6 (six) hours as needed.  07/26/14   Heather Laisure, PA-C  predniSONE (DELTASONE) 20 MG tablet 3 tabs po daily x 3 days, then 2 tabs x 3 days, then 1.5 tabs x 3 days, then 1 tab x 3 days, then 0.5 tabs x 3 days 03/22/15   HJarrett SohoMuthersbaugh, PA-C   BP 137/109 mmHg  Pulse 112  Temp(Src) 98 F (36.7 C) (Oral)  Resp 20  Ht 6' (1.829 m)  Wt 230 lb (104.327 kg)  BMI 31.19 kg/m2  SpO2 97% Physical Exam  Constitutional: He is oriented to person, place, and time. He appears well-developed and well-nourished. No distress.  Awake, alert, nontoxic appearance  HENT:  Head: Normocephalic and atraumatic.  Right Ear: Tympanic membrane, external ear and ear canal normal.  Left Ear: Tympanic membrane, external ear and ear canal normal.  Nose: No mucosal edema or rhinorrhea. No epistaxis. Right sinus exhibits no maxillary sinus tenderness and no frontal sinus tenderness. Left sinus exhibits no maxillary sinus tenderness and no frontal sinus tenderness.  Mouth/Throat: Uvula is midline and mucous membranes are normal. Mucous membranes are not pale and not cyanotic. Posterior oropharyngeal erythema present. No oropharyngeal exudate, posterior oropharyngeal edema or tonsillar abscesses.  Posterior oropharyngeal erythema without edema or exudate  Eyes: Conjunctivae are normal. Pupils are equal, round, and reactive to light. No scleral icterus.  Neck: Normal range of motion and full passive range of motion without pain. Neck supple.  Cardiovascular: Normal rate, regular rhythm, normal heart sounds and intact distal pulses.   Pulmonary/Chest: Effort normal and breath sounds normal. No stridor. No respiratory distress. He has no wheezes.  Equal chest expansion  Abdominal: Soft. Bowel sounds are normal. He exhibits no distension and no mass. There is no tenderness.  There is no rebound and no guarding.  Abdomen soft and nontender  Musculoskeletal: Normal range of motion. He exhibits no edema.  Lymphadenopathy:    He has no cervical adenopathy.  Neurological: He is alert and oriented to person, place, and time.  Speech is clear and goal oriented Moves extremities without ataxia  Skin: Skin is warm and dry. No rash noted. He is not diaphoretic.  Psychiatric: He has a normal mood and affect.  Nursing note and vitals reviewed.   ED Course  Procedures (including critical care time) Labs Review Labs Reviewed  CBC WITH DIFFERENTIAL/PLATELET - Abnormal; Notable for the following:    WBC 14.4 (*)    RDW 17.2 (*)    Neutro Abs 10.0 (*)    Monocytes Absolute 1.7 (*)    All other components within normal limits  URINALYSIS, ROUTINE W REFLEX [facility]CROSCOPIC [name] - Abnormal; Notable for the following:    Color, Urine AMBER (*)    Specific Gravity, Urine 1.037 (*)    Glucose, UA 250 (*)    Bilirubin Urine SMALL (*)    Ketones, ur 15 (*)    Protein, ur 100 (*)    All other components within normal limits  URINE MICROSCOPIC-ADD ON - Abnormal; Notable for the following:    Casts HYALINE CASTS (*)  All other components within normal limits  LIPASE, BLOOD - Abnormal; Notable for the following:    Lipase 19 (*)    All other components within normal limits  I-STAT CHEM 8, ED - Abnormal; Notable for the following:    Glucose, Bld 151 (*)    Hemoglobin 17.3 (*)    All other components within normal limits  RAPID STREP SCREEN (NOT AT [facility])  CULTURE, GROUP A STREP    Imaging Review Dg Abd Acute W/chest  03/22/2015   CLINICAL DATA:  Emesis and fatigue.  EXAM: DG ABDOMEN ACUTE W/ 1V CHEST  COMPARISON:  CT abdomen and pelvis 07/26/2014. Acute abdominal series 06/26/13.  FINDINGS: There is no evidence of dilated bowel loops or free intraperitoneal air. No radiopaque calculi or other significant radiographic abnormality is seen. Heart size and mediastinal  contours are within normal limits. Both lungs are clear.  IMPRESSION: Nonobstructive gas pattern.  No acute cardiopulmonary disease.   Electronically Signed   By: Rolla Flatten M.D.   On: 03/22/2015 01:02     EKG Interpretation None      MDM   Final diagnoses:  Non-intractable vomiting with nausea, vomiting of unspecified type   Wynn Maudlin resents with abdominal pain, nausea and vomiting for the last several days. History of ulcerative colitis and Crohn's.  Patient with leukocytosis however he has had this before. He is dehydrated with increased specific gravity, ketones in his urine.  Acute abdominal series with nonobstructive gas pattern.  Negative strep.  1:20 AM IV established and patient given medicines. Will give fluids, pain control, nausea medicine, by mouth trial and reassess.  Plan: If patient feels better and passes by mouth trial he may be discharged home. If further emesis ensues he will need admission.  Case discussed with Dr. Linton Flemings who will follow and dispo.    Jarrett Soho Joua Bake, PA-C 03/22/15 [place] Yao, MD 03/24/15 [phone]

## 2015-03-22 ENCOUNTER — Emergency Department (HOSPITAL_COMMUNITY): Payer: Medicare Other

## 2015-03-22 DIAGNOSIS — R5383 Other fatigue: Secondary | ICD-10-CM | POA: Diagnosis not present

## 2015-03-22 DIAGNOSIS — R1110 Vomiting, unspecified: Secondary | ICD-10-CM | POA: Diagnosis not present

## 2015-03-22 LAB — LIPASE, BLOOD: Lipase: 19 U/L — ABNORMAL LOW (ref 22–51)

## 2015-03-22 MED ORDER — ONDANSETRON HCL 4 MG/2ML IJ SOLN
4.0000 mg | Freq: Once | INTRAMUSCULAR | Status: AC
  Administered 2015-03-22: 4 mg via INTRAVENOUS
  Filled 2015-03-22: qty 2

## 2015-03-22 MED ORDER — HYDROMORPHONE HCL 1 MG/ML IJ SOLN
1.0000 mg | Freq: Once | INTRAMUSCULAR | Status: AC
  Administered 2015-03-22: 1 mg via INTRAVENOUS
  Filled 2015-03-22: qty 1

## 2015-03-22 MED ORDER — SODIUM CHLORIDE 0.9 % IV BOLUS (SEPSIS)
1000.0000 mL | Freq: Once | INTRAVENOUS | Status: AC
  Administered 2015-03-22: 1000 mL via INTRAVENOUS

## 2015-03-22 MED ORDER — PREDNISONE 20 MG PO TABS: ORAL_TABLET | ORAL | Status: DC

## 2015-03-22 MED ORDER — PROMETHAZINE HCL 25 MG/ML IJ SOLN
12.5000 mg | Freq: Once | INTRAMUSCULAR | Status: AC
  Administered 2015-03-22: 12.5 mg via INTRAVENOUS
  Filled 2015-03-22: qty 1

## 2015-03-22 MED ORDER — ONDANSETRON 8 MG PO TBDP: ORAL_TABLET | ORAL | Status: DC

## 2015-03-22 MED ORDER — HYDROCODONE-ACETAMINOPHEN 5-325 MG PO TABS: 1.0000 | ORAL_TABLET | Freq: Four times a day (QID) | ORAL | Status: DC | PRN

## 2015-03-22 NOTE — ED Notes (Signed)
Pt offered fluid

## 2015-03-22 NOTE — Discharge Instructions (Signed)
1. Medications: zofran, vicodin, prednisone, usual home medications 2. Treatment: rest, drink plenty of fluids, advance diet slowly 3. Follow Up: Please followup with your primary doctor in 2 days for discussion of your diagnoses and further evaluation after today's visit; if you do not have a primary care doctor use the resource guide provided to find one; Please return to the ER for persistent vomiting, high fevers or worsening symptoms    Nausea and Vomiting Nausea is a sick feeling that often comes before throwing up (vomiting). Vomiting is a reflex where stomach contents come out of your mouth. Vomiting can cause severe loss of body fluids (dehydration). Children and elderly adults can become dehydrated quickly, especially if they also have diarrhea. Nausea and vomiting are symptoms of a condition or disease. It is important to find the cause of your symptoms. CAUSES   Direct irritation of the stomach lining. This irritation can result from increased acid production (gastroesophageal reflux disease), infection, food poisoning, taking certain medicines (such as nonsteroidal anti-inflammatory drugs), alcohol use, or tobacco use.  Signals from the brain.These signals could be caused by a headache, heat exposure, an inner ear disturbance, increased pressure in the brain from injury, infection, a tumor, or a concussion, pain, emotional stimulus, or metabolic problems.  An obstruction in the gastrointestinal tract (bowel obstruction).  Illnesses such as diabetes, hepatitis, gallbladder problems, appendicitis, kidney problems, cancer, sepsis, atypical symptoms of a heart attack, or eating disorders.  Medical treatments such as chemotherapy and radiation.  Receiving medicine that makes you sleep (general anesthetic) during surgery. DIAGNOSIS Your caregiver may ask for tests to be done if the problems do not improve after a few days. Tests may also be done if symptoms are severe or if the reason  for the nausea and vomiting is not clear. Tests may include:  Urine tests.  Blood tests.  Stool tests.  Cultures (to look for evidence of infection).  X-rays or other imaging studies. Test results can help your caregiver make decisions about treatment or the need for additional tests. TREATMENT You need to stay well hydrated. Drink frequently but in small amounts.You may wish to drink water, sports drinks, clear broth, or eat frozen ice pops or gelatin dessert to help stay hydrated.When you eat, eating slowly may help prevent nausea.There are also some antinausea medicines that may help prevent nausea. HOME CARE INSTRUCTIONS   Take all medicine as directed by your caregiver.  If you do not have an appetite, do not force yourself to eat. However, you must continue to drink fluids.  If you have an appetite, eat a normal diet unless your caregiver tells you differently.  Eat a variety of complex carbohydrates (rice, wheat, potatoes, bread), lean meats, yogurt, fruits, and vegetables.  Avoid high-fat foods because they are more difficult to digest.  Drink enough water and fluids to keep your urine clear or pale yellow.  If you are dehydrated, ask your caregiver for specific rehydration instructions. Signs of dehydration may include:  Severe thirst.  Dry lips and mouth.  Dizziness.  Dark urine.  Decreasing urine frequency and amount.  Confusion.  Rapid breathing or pulse. SEEK IMMEDIATE MEDICAL CARE IF:   You have blood or brown flecks (like coffee grounds) in your vomit.  You have black or bloody stools.  You have a severe headache or stiff neck.  You are confused.  You have severe abdominal pain.  You have chest pain or trouble breathing.  You do not urinate at least once every  8 hours.  You develop cold or clammy skin.  You continue to vomit for longer than 24 to 48 hours.  You have a fever. MAKE SURE YOU:   Understand these instructions.  Will  watch your condition.  Will get help right away if you are not doing well or get worse. Document Released: 10/02/2005 Document Revised: 12/25/2011 Document Reviewed: 03/01/2011 [facility] Patient Information 2015 [place], [place]. This information is not intended to replace advice given to you by your health care provider. Make sure you discuss any questions you have with your health care provider.

## 2015-03-22 NOTE — ED Notes (Signed)
Dr Sharol Given made aware of patients request for more pain meds.  To come see the patient.

## 2015-03-24 LAB — CULTURE, GROUP A STREP: STREP A CULTURE: NEGATIVE

## 2015-03-25 ENCOUNTER — Encounter (HOSPITAL_COMMUNITY): Payer: Self-pay | Admitting: *Deleted

## 2015-03-25 ENCOUNTER — Emergency Department (HOSPITAL_COMMUNITY): Payer: Medicare Other

## 2015-03-25 ENCOUNTER — Emergency Department (HOSPITAL_COMMUNITY)
Admission: EM | Admit: 2015-03-25 | Discharge: 2015-03-25 | Disposition: A | Discharge status: Home / Self Care | Payer: Medicare Other | Attending: Emergency Medicine | Admitting: Emergency Medicine

## 2015-03-25 DIAGNOSIS — Z7952 Long term (current) use of systemic steroids: Secondary | ICD-10-CM | POA: Diagnosis not present

## 2015-03-25 DIAGNOSIS — R112 Nausea with vomiting, unspecified: Secondary | ICD-10-CM | POA: Diagnosis not present

## 2015-03-25 DIAGNOSIS — R109 Unspecified abdominal pain: Secondary | ICD-10-CM

## 2015-03-25 DIAGNOSIS — Z8719 Personal history of other diseases of the digestive system: Secondary | ICD-10-CM | POA: Insufficient documentation

## 2015-03-25 DIAGNOSIS — Z720 Tobacco use: Secondary | ICD-10-CM | POA: Diagnosis not present

## 2015-03-25 DIAGNOSIS — F209 Schizophrenia, unspecified: Secondary | ICD-10-CM | POA: Diagnosis not present

## 2015-03-25 DIAGNOSIS — Z79899 Other long term (current) drug therapy: Secondary | ICD-10-CM | POA: Diagnosis not present

## 2015-03-25 DIAGNOSIS — R1084 Generalized abdominal pain: Secondary | ICD-10-CM | POA: Insufficient documentation

## 2015-03-25 DIAGNOSIS — K922 Gastrointestinal hemorrhage, unspecified: Secondary | ICD-10-CM | POA: Diagnosis not present

## 2015-03-25 DIAGNOSIS — K5010 Crohn's disease of large intestine without complications: Secondary | ICD-10-CM | POA: Diagnosis not present

## 2015-03-25 DIAGNOSIS — R100 Acute abdomen: Secondary | ICD-10-CM | POA: Diagnosis not present

## 2015-03-25 HISTORY — DX: Multiple myeloma not having achieved remission: C90.00

## 2015-03-25 HISTORY — DX: Sickle-cell disease without crisis: D57.1

## 2015-03-25 LAB — CBC WITH DIFFERENTIAL/PLATELET
BASOS ABS: 0 10*3/uL (ref 0.0–0.1)
Basophils Relative: 0 % (ref 0–1)
Eosinophils Absolute: 0 10*3/uL (ref 0.0–0.7)
Eosinophils Relative: 0 % (ref 0–5)
HCT: 46.1 % (ref 39.0–52.0)
Hemoglobin: 15 g/dL (ref 13.0–17.0)
LYMPHS PCT: 34 % (ref 12–46)
Lymphs Abs: 5.5 10*3/uL — ABNORMAL HIGH (ref 0.7–4.0)
MCH: 26.8 pg (ref 26.0–34.0)
MCHC: 32.5 g/dL (ref 30.0–36.0)
MCV: 82.5 fL (ref 78.0–100.0)
MONO ABS: 1.2 10*3/uL — AB (ref 0.1–1.0)
Monocytes Relative: 8 % (ref 3–12)
NEUTROS PCT: 58 % (ref 43–77)
Neutro Abs: 9.4 10*3/uL — ABNORMAL HIGH (ref 1.7–7.7)
Platelets: 337 10*3/uL (ref 150–400)
RBC: 5.59 MIL/uL (ref 4.22–5.81)
RDW: 17.6 % — ABNORMAL HIGH (ref 11.5–15.5)
WBC: 16.2 10*3/uL — AB (ref 4.0–10.5)

## 2015-03-25 LAB — COMPREHENSIVE METABOLIC PANEL
ALK PHOS: 94 U/L (ref 38–126)
ALT: 31 U/L (ref 17–63)
ANION GAP: 13 (ref 5–15)
AST: 32 U/L (ref 15–41)
Albumin: 3.9 g/dL (ref 3.5–5.0)
BUN: 17 mg/dL (ref 6–20)
CO2: 24 mmol/L (ref 22–32)
Calcium: 9.3 mg/dL (ref 8.9–10.3)
Chloride: 106 mmol/L (ref 101–111)
Creatinine, Ser: 1.04 mg/dL (ref 0.61–1.24)
GFR calc Af Amer: >60 mL/min (ref >60)
GFR calc non Af Amer: >60 mL/min (ref >60)
Glucose, Bld: 192 mg/dL — ABNORMAL HIGH (ref 65–99)
Potassium: 3.1 mmol/L — ABNORMAL LOW (ref 3.5–5.1)
Sodium: 143 mmol/L (ref 135–145)
Total Bilirubin: 0.3 mg/dL (ref 0.3–1.2)
Total Protein: 7.1 g/dL (ref 6.5–8.1)

## 2015-03-25 LAB — LIPASE, BLOOD: Lipase: 23 U/L (ref 22–51)

## 2015-03-25 MED ORDER — ONDANSETRON HCL 4 MG/2ML IJ SOLN
4.0000 mg | Freq: Once | INTRAMUSCULAR | Status: AC
  Administered 2015-03-25: 4 mg via INTRAVENOUS
  Filled 2015-03-25: qty 2

## 2015-03-25 MED ORDER — IOHEXOL 300 MG/ML  SOLN
100.0000 mL | Freq: Once | INTRAMUSCULAR | Status: AC | PRN
  Administered 2015-03-25: 100 mL via INTRAVENOUS

## 2015-03-25 MED ORDER — HYDROMORPHONE HCL 1 MG/ML IJ SOLN
1.0000 mg | Freq: Once | INTRAMUSCULAR | Status: AC
  Administered 2015-03-25: 1 mg via INTRAVENOUS
  Filled 2015-03-25: qty 1

## 2015-03-25 MED ORDER — SODIUM CHLORIDE 0.9 % IV BOLUS (SEPSIS)
1000.0000 mL | Freq: Once | INTRAVENOUS | Status: AC
  Administered 2015-03-25: 1000 mL via INTRAVENOUS

## 2015-03-25 MED ORDER — MORPHINE SULFATE 4 MG/ML IJ SOLN
4.0000 mg | Freq: Once | INTRAMUSCULAR | Status: AC
  Administered 2015-03-25: 4 mg via INTRAVENOUS
  Filled 2015-03-25: qty 1

## 2015-03-25 MED ORDER — IOHEXOL 300 MG/ML  SOLN: 25.0000 mL | Freq: Once | INTRAMUSCULAR | Status: DC | PRN

## 2015-03-25 MED ORDER — PROMETHAZINE HCL 25 MG PO TABS: 25.0000 mg | ORAL_TABLET | Freq: Four times a day (QID) | ORAL | Status: DC | PRN

## 2015-03-25 NOTE — ED Notes (Signed)
Pt was tx here on Monday for a Crohn's exacerbation and discharged home.  Since then he has been vomiting and having black tarry diarrhea and abdominal pain.  He has been able to tolerate SL zofran but not PO morphine.

## 2015-03-25 NOTE — ED Provider Notes (Signed)
CSN: [id number]     Arrival date & time 03/25/15  1643 History   First MD Initiated Contact with Patient 03/25/15 1711     Chief Complaint  Patient presents with  . Abdominal Pain     (Consider location/radiation/quality/duration/timing/severity/associated sxs/prior Treatment) HPI Comments: Patient presents to the emergency room for evaluation of abdominal pain. Patient reports that symptoms have been ongoing for 4 days. Patient reports that he was in the emergency department when symptoms began, was discharged home. Since then he has had persistent nausea and vomiting, has been passing dark and tarry stools. He has not had any vomiting of blood. There is no rectal bleeding. Patient indicates that the pain is diffuse across his abdomen, constant, moderate to severe.  Patient is a 34 y.o. male presenting with abdominal pain.  Abdominal Pain Associated symptoms: nausea and vomiting     Past Medical History  Diagnosis Date  . Ulcerative colitis   . Schizophrenia   . GI (gastrointestinal bleed) 04/2013  . Crohn's disease   . Suicide attempt by drug ingestion    Past Surgical History  Procedure Laterality Date  . Mandible fracture surgery    . Flexible sigmoidoscopy  12/05/2011    Procedure: FLEXIBLE SIGMOIDOSCOPY;  Surgeon: Beryle Beams, MD;  Location: [facility] ENDOSCOPY;  Service: Endoscopy;  Laterality: N/A;  . Esophagogastroduodenoscopy N/A 05/24/2013    Procedure: ESOPHAGOGASTRODUODENOSCOPY (EGD);  Surgeon: Ladene Artist, MD;  Location: [facility] ENDOSCOPY;  Service: Endoscopy;  Laterality: N/A;   Family History  Problem Relation Age of Onset  . Liver cancer     History  Substance Use Topics  . Smoking status: Current Every Day Smoker -- 0.25 packs/day for 20 years    Types: Cigarettes  . Smokeless tobacco: Never Used  . Alcohol Use: Yes     Comment: occasional    Review of Systems  Gastrointestinal: Positive for nausea, vomiting and abdominal pain. Negative for blood in stool and  anal bleeding.  All other systems reviewed and are negative.     Allergies  Depakote; Lithium; Nsaids; and Risperidone and related  Home Medications   Prior to Admission medications   Medication Sig Start Date End Date Taking? Authorizing Provider  HYDROcodone-acetaminophen (NORCO/VICODIN) 5-325 MG per tablet Take 1-2 tablets by mouth every 6 (six) hours as needed for moderate pain or severe pain. 03/22/15   Linton Flemings, MD  olanzapine zydis (ZYPREXA) 15 MG disintegrating tablet Take 1 tablet by mouth daily. 03/02/15   Historical Provider, MD  ondansetron (ZOFRAN ODT) 8 MG disintegrating tablet 74m ODT q4 hours prn nausea 03/22/15   OLinton Flemings MD  predniSONE (DELTASONE) 20 MG tablet 3 tabs po daily x 3 days, then 2 tabs x 3 days, then 1.5 tabs x 3 days, then 1 tab x 3 days, then 0.5 tabs x 3 days 03/22/15   OLinton Flemings MD   BP 147/83 mmHg  Pulse 108  Temp(Src) 98.4 F (36.9 C) (Oral)  Resp 16  Ht 6' (1.829 m)  Wt 237 lb (107.502 kg)  BMI 32.14 kg/m2  SpO2 97% Physical Exam  Constitutional: He is oriented to person, place, and time. He appears well-developed and well-nourished. No distress.  HENT:  Head: Normocephalic and atraumatic.  Right Ear: Hearing normal.  Left Ear: Hearing normal.  Nose: Nose normal.  Mouth/Throat: Oropharynx is clear and moist and mucous membranes are normal.  Eyes: Conjunctivae and EOM are normal. Pupils are equal, round, and reactive to light.  Neck: Normal range of  motion. Neck supple.  Cardiovascular: Regular rhythm, S1 normal and S2 normal.  Exam reveals no gallop and no friction rub.   No murmur heard. Pulmonary/Chest: Effort normal and breath sounds normal. No respiratory distress. He exhibits no tenderness.  Abdominal: Soft. Normal appearance and bowel sounds are normal. There is no hepatosplenomegaly. There is generalized tenderness. There is no rebound, no guarding, no tenderness at McBurney's point and negative Murphy's sign. No hernia.   Musculoskeletal: Normal range of motion.  Neurological: He is alert and oriented to person, place, and time. He has normal strength. No cranial nerve deficit or sensory deficit. Coordination normal. GCS eye subscore is 4. GCS verbal subscore is 5. GCS motor subscore is 6.  Skin: Skin is warm, dry and intact. No rash noted. No cyanosis.  Psychiatric: He has a normal mood and affect. His speech is normal and behavior is normal. Thought content normal.  Nursing note and vitals reviewed.   ED Course  Procedures (including critical care time) Labs Review Labs Reviewed  CBC WITH DIFFERENTIAL/PLATELET - Abnormal; Notable for the following:    WBC 16.2 (*)    RDW 17.6 (*)    Neutro Abs 9.4 (*)    Lymphs Abs 5.5 (*)    Monocytes Absolute 1.2 (*)    All other components within normal limits  COMPREHENSIVE METABOLIC PANEL - Abnormal; Notable for the following:    Potassium 3.1 (*)    Glucose, Bld 192 (*)    All other components within normal limits  LIPASE, BLOOD  POC OCCULT BLOOD, ED    Imaging Review No results found.   EKG Interpretation None      MDM   Final diagnoses:  Abdominal pain, acute    Patient presents to the emergency department for evaluation of abdominal pain. Patient reports that he has a history of Crohn's disease, has been experiencing diffuse abdominal pain for several days. He reports recurrent vomiting. He has not seen any blood in his vomit. He does report black tarry stools, however, without any hematemesis, this is unlikely to represent GI bleeding. Patient is not hypotensive, hemoglobin is normal. Patient underwent CT scan to further evaluate. There is no evidence of acute Crohn's flare or other abnormality noted.    Orpah Greek, MD 03/25/15 [phone]

## 2015-03-25 NOTE — Discharge Instructions (Signed)

## 2015-03-25 NOTE — ED Notes (Signed)
Patient asked for a [employer] and a Coke to go!

## 2015-05-06 DIAGNOSIS — F209 Schizophrenia, unspecified: Secondary | ICD-10-CM | POA: Diagnosis not present

## 2015-06-23 ENCOUNTER — Encounter (HOSPITAL_COMMUNITY): Payer: Self-pay | Admitting: *Deleted

## 2015-06-23 ENCOUNTER — Emergency Department (HOSPITAL_COMMUNITY)
Admission: EM | Admit: 2015-06-23 | Discharge: 2015-06-23 | Disposition: A | Discharge status: Home / Self Care | Payer: Medicare Other | Attending: Emergency Medicine | Admitting: Emergency Medicine

## 2015-06-23 ENCOUNTER — Emergency Department (HOSPITAL_COMMUNITY): Payer: Medicare Other

## 2015-06-23 DIAGNOSIS — Z862 Personal history of diseases of the blood and blood-forming organs and certain disorders involving the immune mechanism: Secondary | ICD-10-CM | POA: Insufficient documentation

## 2015-06-23 DIAGNOSIS — Z8659 Personal history of other mental and behavioral disorders: Secondary | ICD-10-CM | POA: Diagnosis not present

## 2015-06-23 DIAGNOSIS — R05 Cough: Secondary | ICD-10-CM | POA: Diagnosis not present

## 2015-06-23 DIAGNOSIS — Z8579 Personal history of other malignant neoplasms of lymphoid, hematopoietic and related tissues: Secondary | ICD-10-CM | POA: Insufficient documentation

## 2015-06-23 DIAGNOSIS — K5900 Constipation, unspecified: Secondary | ICD-10-CM | POA: Insufficient documentation

## 2015-06-23 DIAGNOSIS — Z720 Tobacco use: Secondary | ICD-10-CM | POA: Diagnosis not present

## 2015-06-23 DIAGNOSIS — Z79899 Other long term (current) drug therapy: Secondary | ICD-10-CM | POA: Diagnosis not present

## 2015-06-23 DIAGNOSIS — J40 Bronchitis, not specified as acute or chronic: Secondary | ICD-10-CM

## 2015-06-23 DIAGNOSIS — R109 Unspecified abdominal pain: Secondary | ICD-10-CM | POA: Diagnosis not present

## 2015-06-23 DIAGNOSIS — R079 Chest pain, unspecified: Secondary | ICD-10-CM | POA: Diagnosis not present

## 2015-06-23 DIAGNOSIS — R0602 Shortness of breath: Secondary | ICD-10-CM | POA: Diagnosis not present

## 2015-06-23 DIAGNOSIS — J209 Acute bronchitis, unspecified: Secondary | ICD-10-CM | POA: Diagnosis not present

## 2015-06-23 DIAGNOSIS — R51 Headache: Secondary | ICD-10-CM | POA: Diagnosis not present

## 2015-06-23 DIAGNOSIS — R112 Nausea with vomiting, unspecified: Secondary | ICD-10-CM | POA: Diagnosis present

## 2015-06-23 LAB — URINALYSIS, ROUTINE W REFLEX MICROSCOPIC
Bilirubin Urine: NEGATIVE
GLUCOSE, UA: NEGATIVE mg/dL
Hgb urine dipstick: NEGATIVE
Ketones, ur: NEGATIVE mg/dL
LEUKOCYTES UA: NEGATIVE
Nitrite: NEGATIVE
PH: 6 (ref 5.0–8.0)
PROTEIN: NEGATIVE mg/dL
Specific Gravity, Urine: 1.021 (ref 1.005–1.030)
Urobilinogen, UA: 1 mg/dL (ref 0.0–1.0)

## 2015-06-23 LAB — RAPID STREP SCREEN (MED CTR MEBANE ONLY): Streptococcus, Group A Screen (Direct): NEGATIVE

## 2015-06-23 LAB — COMPREHENSIVE METABOLIC PANEL
ALT: 22 U/L (ref 17–63)
ANION GAP: 7 (ref 5–15)
AST: 25 U/L (ref 15–41)
Albumin: 4 g/dL (ref 3.5–5.0)
Alkaline Phosphatase: 101 U/L (ref 38–126)
BUN: 11 mg/dL (ref 6–20)
CHLORIDE: 104 mmol/L (ref 101–111)
CO2: 28 mmol/L (ref 22–32)
Calcium: 8.9 mg/dL (ref 8.9–10.3)
Creatinine, Ser: 1.01 mg/dL (ref 0.61–1.24)
GFR calc Af Amer: >60 mL/min (ref >60)
Glucose, Bld: 110 mg/dL — ABNORMAL HIGH (ref 65–99)
POTASSIUM: 3.8 mmol/L (ref 3.5–5.1)
SODIUM: 139 mmol/L (ref 135–145)
Total Bilirubin: 1 mg/dL (ref 0.3–1.2)
Total Protein: 7.1 g/dL (ref 6.5–8.1)

## 2015-06-23 LAB — CBC
HCT: 46.7 % (ref 39.0–52.0)
HEMOGLOBIN: 15.3 g/dL (ref 13.0–17.0)
MCH: 27.6 pg (ref 26.0–34.0)
MCHC: 32.8 g/dL (ref 30.0–36.0)
MCV: 84.3 fL (ref 78.0–100.0)
Platelets: 309 10*3/uL (ref 150–400)
RBC: 5.54 MIL/uL (ref 4.22–5.81)
RDW: 16.8 % — ABNORMAL HIGH (ref 11.5–15.5)
WBC: 8.9 10*3/uL (ref 4.0–10.5)

## 2015-06-23 LAB — LIPASE, BLOOD: LIPASE: 26 U/L (ref 22–51)

## 2015-06-23 LAB — MONONUCLEOSIS SCREEN: MONO SCREEN: NEGATIVE

## 2015-06-23 MED ORDER — SODIUM CHLORIDE 0.9 % IV BOLUS (SEPSIS)
1000.0000 mL | Freq: Once | INTRAVENOUS | Status: AC
  Administered 2015-06-23: 1000 mL via INTRAVENOUS

## 2015-06-23 MED ORDER — ONDANSETRON 8 MG PO TBDP: 8.0000 mg | ORAL_TABLET | Freq: Three times a day (TID) | ORAL | Status: DC | PRN

## 2015-06-23 MED ORDER — PREDNISONE 20 MG PO TABS: 40.0000 mg | ORAL_TABLET | Freq: Every day | ORAL | Status: DC

## 2015-06-23 MED ORDER — ALBUTEROL SULFATE HFA 108 (90 BASE) MCG/ACT IN AERS
2.0000 | INHALATION_SPRAY | RESPIRATORY_TRACT | Status: DC
  Administered 2015-06-23: 2 via RESPIRATORY_TRACT
  Filled 2015-06-23: qty 6.7

## 2015-06-23 MED ORDER — ONDANSETRON HCL 4 MG/2ML IJ SOLN
4.0000 mg | Freq: Once | INTRAMUSCULAR | Status: AC
  Administered 2015-06-23: 4 mg via INTRAVENOUS
  Filled 2015-06-23: qty 2

## 2015-06-23 MED ORDER — AZITHROMYCIN 250 MG PO TABS: 250.0000 mg | ORAL_TABLET | Freq: Every day | ORAL | Status: DC

## 2015-06-23 NOTE — ED Notes (Addendum)
Pt reports cough, sore throat for 1 week. Pt reports green mucous with cough. Pt noted to have red swollen tonsils. Pt also reports n/v that is worse with eating.

## 2015-06-23 NOTE — ED Notes (Signed)
Pt given water for PO challenge per Farner, PA.

## 2015-06-23 NOTE — ED Provider Notes (Signed)
CSN: [id number]     Arrival date & time 06/23/15  1347 History   First MD Initiated Contact with Patient 06/23/15 1545     Chief Complaint  Patient presents with  . Cough  . Sore Throat  . Emesis     (Consider location/radiation/quality/duration/timing/severity/associated sxs/prior Treatment) HPI [name] is a 34 y.o. male with history of ulcerative colitis, Crohn's disease, schizophrenia, presents to emergency room complaining of cough and congestion. Patient states his symptoms started a week ago. He states he has productive cough with thick sputum. He also reports sore throat and swollen tonsils. He reports nausea vomiting that is worse with eating. Denies diarrhea. States he feels like his abdomen might be swollen. No medications taken prior to coming in. Patient is a smoker. Denies blood in stool or emesis. No fever or chills. No back pain. Nothing making symptoms better or worse.   Past Medical History  Diagnosis Date  . Ulcerative colitis   . Schizophrenia   . GI (gastrointestinal bleed) 04/2013  . Crohn's disease   . Suicide attempt by drug ingestion   . Sickle cell anemia   . Multiple myeloma    Past Surgical History  Procedure Laterality Date  . Mandible fracture surgery    . Flexible sigmoidoscopy  12/05/2011    Procedure: FLEXIBLE SIGMOIDOSCOPY;  Surgeon: Beryle Beams, MD;  Location: [facility] ENDOSCOPY;  Service: Endoscopy;  Laterality: N/A;  . Esophagogastroduodenoscopy N/A 05/24/2013    Procedure: ESOPHAGOGASTRODUODENOSCOPY (EGD);  Surgeon: Ladene Artist, MD;  Location: [facility] ENDOSCOPY;  Service: Endoscopy;  Laterality: N/A;   Family History  Problem Relation Age of Onset  . Liver cancer     Social History  Substance Use Topics  . Smoking status: Current Every Day Smoker -- 0.25 packs/day for 20 years    Types: Cigarettes  . Smokeless tobacco: Never Used  . Alcohol Use: Yes     Comment: occasional    Review of Systems  Constitutional: Negative for fever and  chills.  HENT: Positive for congestion and sore throat.   Respiratory: Positive for cough, chest tightness and shortness of breath.   Cardiovascular: Positive for chest pain. Negative for palpitations and leg swelling.  Gastrointestinal: Positive for nausea, vomiting, abdominal pain and abdominal distention. Negative for diarrhea.  Genitourinary: Negative for dysuria, urgency, frequency and hematuria.  Musculoskeletal: Positive for myalgias. Negative for arthralgias, neck pain and neck stiffness.  Skin: Negative for rash.  Allergic/Immunologic: Negative for immunocompromised state.  Neurological: Positive for headaches. Negative for dizziness, weakness, light-headedness and numbness.      Allergies  Depakote; Lithium; Nsaids; and Risperidone and related  Home Medications   Prior to Admission medications   Medication Sig Start Date End Date Taking? Authorizing Provider  acetaminophen (TYLENOL) 500 MG tablet Take 1,000 mg by mouth every 6 (six) hours as needed for moderate pain.   Yes Historical Provider, MD  olanzapine zydis (ZYPREXA) 15 MG disintegrating tablet Take 1 tablet by mouth daily. 03/02/15  Yes Historical Provider, MD  Phenyleph-Doxylamine-DM-APAP (NYQUIL SEVERE COLD/FLU) 5-6.25-10-325 MG/15ML LIQD Take 30 mLs by mouth 3 (three) times daily.   Yes Historical Provider, MD  azithromycin (ZITHROMAX) 250 MG tablet Take 1 tablet (250 mg total) by mouth daily. Take first 2 tablets together, then 1 every day until finished. 06/23/15   Hibah Odonnell, PA-C  HYDROcodone-acetaminophen (NORCO/VICODIN) 5-325 MG per tablet Take 1-2 tablets by mouth every 6 (six) hours as needed for moderate pain or severe pain. Patient not taking: Reported on  06/23/2015 03/22/15   Linton Flemings, MD  ondansetron (ZOFRAN ODT) 8 MG disintegrating tablet 32m ODT q4 hours prn nausea Patient not taking: Reported on 06/23/2015 03/22/15   OLinton Flemings MD  ondansetron (ZOFRAN ODT) 8 MG disintegrating tablet Take 1 tablet (8 mg  total) by mouth every 8 (eight) hours as needed for nausea or vomiting. 06/23/15   Keatin Benham, PA-C  predniSONE (DELTASONE) 20 MG tablet 3 tabs po daily x 3 days, then 2 tabs x 3 days, then 1.5 tabs x 3 days, then 1 tab x 3 days, then 0.5 tabs x 3 days Patient not taking: Reported on 06/23/2015 03/22/15   OLinton Flemings MD  predniSONE (DELTASONE) 20 MG tablet Take 2 tablets (40 mg total) by mouth daily. 06/23/15   Zev Blue, PA-C  promethazine (PHENERGAN) 25 MG tablet Take 1 tablet (25 mg total) by mouth every 6 (six) hours as needed for nausea or vomiting. Patient not taking: Reported on 06/23/2015 03/25/15   COrpah Greek MD   BP 130/75 mmHg  Pulse 73  Temp(Src) 97.9 F (36.6 C)  Resp 16  SpO2 97% Physical Exam  Constitutional: He is oriented to person, place, and time. He appears well-developed and well-nourished. No distress.  HENT:  Head: Normocephalic and atraumatic.  Nose: Nose normal.  Mouth/Throat: Oropharynx is clear and moist.  Oropharynx erythematous with exudates present. Uvula is midline.  Eyes: Conjunctivae are normal.  Neck: Normal range of motion. Neck supple.  Cardiovascular: Normal rate, regular rhythm and normal heart sounds.   Pulmonary/Chest: Effort normal. No respiratory distress. He has wheezes. He has no rales.  Mild end expiratory wheezes bilaterally  Abdominal: Soft. Bowel sounds are normal. He exhibits no distension. There is tenderness. There is no rebound.  Diffusely tender  Musculoskeletal: He exhibits no edema.  Neurological: He is alert and oriented to person, place, and time.  Skin: Skin is warm and dry.  Psychiatric: He has a normal mood and affect.  Nursing note and vitals reviewed.   ED Course  Procedures (including critical care time) Labs Review Labs Reviewed  COMPREHENSIVE METABOLIC PANEL - Abnormal; Notable for the following:    Glucose, Bld 110 (*)    All other components within normal limits  CBC - Abnormal; Notable for  the following:    RDW 16.8 (*)    All other components within normal limits  [facility]  CULTURE, GROUP A STREP  LIPASE, BLOOD  URINALYSIS, ROUTINE W REFLEX MICROSCOPIC ([facility]  MONONUCLEOSIS SCREEN    Imaging Review Dg Chest 2 View  06/23/2015   CLINICAL DATA:  Chest pain, shortness of breath.  EXAM: CHEST  2 VIEW  COMPARISON:  March 22, 2015.  FINDINGS: The heart size and mediastinal contours are within normal limits. Both lungs are clear. No pneumothorax or pleural effusion is noted. The visualized skeletal structures are unremarkable.  IMPRESSION: No active cardiopulmonary disease.   Electronically Signed   By: JMarijo Conception M.D.   On: 06/23/2015 16:36   Dg Abd 2 Views  06/23/2015   CLINICAL DATA:  Chest pain, cough, shortness of breath, night sweats, nausea, abdominal pain with distention all for 1 week.  EXAM: ABDOMEN - 2 VIEW  COMPARISON:  CT abdomen and pelvis 03/25/2015. Chest and two views abdomen 03/22/2015.  FINDINGS: The bowel gas pattern is normal. There is no evidence of free air. No radio-opaque calculi or other significant radiographic abnormality is seen. There is a large volume  of stool in the ascending and proximal transverse colon.  IMPRESSION: No acute abnormality.  Large volume of stool ascending and transverse colon.   Electronically Signed   By: Inge Rise M.D.   On: 06/23/2015 16:36   I have personally reviewed and evaluated these images and lab results as part of my medical decision-making.   EKG Interpretation None      MDM   Final diagnoses:  Abdominal pain  Bronchitis  Constipation, unspecified constipation type    Patient with cough, productive purulent sputum, congestion, sore throat. He also mentioned abdominal pain with nausea and vomiting, but only on review of systems. Abdomen is diffusely tender. Pharynx is erythematous, tonsils enlarged with some exudate. Will get strep screen, mono, labs. Will try Zofran and  fluids. Vital signs are normal. He is afebrile. No acute abdomen.   6:32 PM Strep and mono negative. Labs are all within normal. Elevation of white count. Patient feels better with fluids and Zofran. He does have mild wheezing, will discharge home with inhaler, prednisone, Zithromax. His x-ray of the chest is negative, abdominal x-ray shows constipation. Advised to take some over-the-counter laxities. At this time I do not think he is having a Crohn's or ulcer colitis exacerbation. He will be discharged home with close outpatient follow-up.  Filed Vitals:   06/23/15 1715 06/23/15 1730 06/23/15 1745 06/23/15 1800  BP: 130/65 118/71 143/75 130/75  Pulse: 80 79 79 73  Temp:      Resp:      SpO2: 98% 97% 97% 97%       Jeannett Senior, PA-C 06/23/15 1833  Quintella Reichert, MD 06/24/15 [phone]

## 2015-06-23 NOTE — ED Notes (Signed)
Pt is in stable condition upon d/c and is escorted from ED via wheelchair. 

## 2015-06-23 NOTE — ED Notes (Signed)
Pt transported to Xray. 

## 2015-06-23 NOTE — Discharge Instructions (Signed)
Take inhaler 2 puffs every 4 hours. Prednisone as prescribed until all gone. Zithromax for infection. Zofran as needed for nausea. Take over-the-counter laxitivess to help with constipation. Follow up with primary care doctor.  Acute Bronchitis Bronchitis is inflammation of the airways that extend from the windpipe into the lungs (bronchi). The inflammation often causes mucus to develop. This leads to a cough, which is the most common symptom of bronchitis.  In acute bronchitis, the condition usually develops suddenly and goes away over time, usually in a couple weeks. Smoking, allergies, and asthma can make bronchitis worse. Repeated episodes of bronchitis may cause further lung problems.  CAUSES Acute bronchitis is most often caused by the same virus that causes a cold. The virus can spread from person to person (contagious) through coughing, sneezing, and touching contaminated objects. SIGNS AND SYMPTOMS   Cough.   Fever.   Coughing up mucus.   Body aches.   Chest congestion.   Chills.   Shortness of breath.   Sore throat.  DIAGNOSIS  Acute bronchitis is usually diagnosed through a physical exam. Your health care provider will also ask you questions about your medical history. Tests, such as chest X-rays, are sometimes done to rule out other conditions.  TREATMENT  Acute bronchitis usually goes away in a couple weeks. Oftentimes, no medical treatment is necessary. Medicines are sometimes given for relief of fever or cough. Antibiotic medicines are usually not needed but may be prescribed in certain situations. In some cases, an inhaler may be recommended to help reduce shortness of breath and control the cough. A cool mist vaporizer may also be used to help thin bronchial secretions and make it easier to clear the chest.  HOME CARE INSTRUCTIONS  Get plenty of rest.   Drink enough fluids to keep your urine clear or pale yellow (unless you have a medical condition that  requires fluid restriction). Increasing fluids may help thin your respiratory secretions (sputum) and reduce chest congestion, and it will prevent dehydration.   Take medicines only as directed by your health care provider.  If you were prescribed an antibiotic medicine, finish it all even if you start to feel better.  Avoid smoking and secondhand smoke. Exposure to cigarette smoke or irritating chemicals will make bronchitis worse. If you are a smoker, consider using nicotine gum or skin patches to help control withdrawal symptoms. Quitting smoking will help your lungs heal faster.   Reduce the chances of another bout of acute bronchitis by washing your hands frequently, avoiding people with cold symptoms, and trying not to touch your hands to your mouth, nose, or eyes.   Keep all follow-up visits as directed by your health care provider.  SEEK MEDICAL CARE IF: Your symptoms do not improve after 1 week of treatment.  SEEK IMMEDIATE MEDICAL CARE IF:  You develop an increased fever or chills.   You have chest pain.   You have severe shortness of breath.  You have bloody sputum.   You develop dehydration.  You faint or repeatedly feel like you are going to pass out.  You develop repeated vomiting.  You develop a severe headache. MAKE SURE YOU:   Understand these instructions.  Will watch your condition.  Will get help right away if you are not doing well or get worse. Document Released: 11/09/2004 Document Revised: 02/16/2014 Document Reviewed: 03/25/2013 [facility] Patient Information 2015 [place], [place]. This information is not intended to replace advice given to you by your health care provider. Make sure  you discuss any questions you have with your health care provider.   Constipation Constipation is when a person has fewer than three bowel movements a week, has difficulty having a bowel movement, or has stools that are dry, hard, or larger than normal. As people  grow older, constipation is more common. If you try to fix constipation with medicines that make you have a bowel movement (laxatives), the problem may get worse. Long-term laxative use may cause the muscles of the colon to become weak. A low-fiber diet, not taking in enough fluids, and taking certain medicines may make constipation worse.  CAUSES   Certain medicines, such as antidepressants, pain medicine, iron supplements, antacids, and water pills.   Certain diseases, such as diabetes, irritable bowel syndrome (IBS), thyroid disease, or depression.   Not drinking enough water.   Not eating enough fiber-rich foods.   Stress or travel.   Lack of physical activity or exercise.   Ignoring the urge to have a bowel movement.   Using laxatives too much.  SIGNS AND SYMPTOMS   Having fewer than three bowel movements a week.   Straining to have a bowel movement.   Having stools that are hard, dry, or larger than normal.   Feeling full or bloated.   Pain in the lower abdomen.   Not feeling relief after having a bowel movement.  DIAGNOSIS  Your health care provider will take a medical history and perform a physical exam. Further testing may be done for severe constipation. Some tests may include:  A barium enema X-ray to examine your rectum, colon, and, sometimes, your small intestine.   A sigmoidoscopy to examine your lower colon.   A colonoscopy to examine your entire colon. TREATMENT  Treatment will depend on the severity of your constipation and what is causing it. Some dietary treatments include drinking more fluids and eating more fiber-rich foods. Lifestyle treatments may include regular exercise. If these diet and lifestyle recommendations do not help, your health care provider may recommend taking over-the-counter laxative medicines to help you have bowel movements. Prescription medicines may be prescribed if over-the-counter medicines do not work.  HOME CARE  INSTRUCTIONS   Eat foods that have a lot of fiber, such as fruits, vegetables, whole grains, and beans.  Limit foods high in fat and processed sugars, such as french fries, hamburgers, cookies, candies, and soda.   A fiber supplement may be added to your diet if you cannot get enough fiber from foods.   Drink enough fluids to keep your urine clear or pale yellow.   Exercise regularly or as directed by your health care provider.   Go to the restroom when you have the urge to go. Do not hold it.   Only take over-the-counter or prescription medicines as directed by your health care provider. Do not take other medicines for constipation without talking to your health care provider first.  [place] IF:   You have bright red blood in your stool.   Your constipation lasts for more than 4 days or gets worse.   You have abdominal or rectal pain.   You have thin, pencil-like stools.   You have unexplained weight loss. MAKE SURE YOU:   Understand these instructions.  Will watch your condition.  Will get help right away if you are not doing well or get worse. Document Released: 06/30/2004 Document Revised: 10/07/2013 Document Reviewed: 07/14/2013 [facility] Patient Information 2015 [place], [place]. This information is not intended to replace advice  given to you by your health care provider. Make sure you discuss any questions you have with your health care provider.

## 2015-06-25 LAB — CULTURE, GROUP A STREP: STREP A CULTURE: NEGATIVE

## 2015-07-15 DIAGNOSIS — F209 Schizophrenia, unspecified: Secondary | ICD-10-CM | POA: Diagnosis not present

## 2015-07-29 ENCOUNTER — Encounter (HOSPITAL_COMMUNITY): Payer: Self-pay | Admitting: Emergency Medicine

## 2015-07-29 ENCOUNTER — Emergency Department (HOSPITAL_COMMUNITY)
Admission: EM | Admit: 2015-07-29 | Discharge: 2015-07-29 | Disposition: A | Discharge status: Home / Self Care | Payer: Medicare Other | Attending: Emergency Medicine | Admitting: Emergency Medicine

## 2015-07-29 ENCOUNTER — Emergency Department (HOSPITAL_COMMUNITY): Payer: Medicare Other

## 2015-07-29 DIAGNOSIS — Z79899 Other long term (current) drug therapy: Secondary | ICD-10-CM | POA: Insufficient documentation

## 2015-07-29 DIAGNOSIS — K625 Hemorrhage of anus and rectum: Secondary | ICD-10-CM | POA: Diagnosis not present

## 2015-07-29 DIAGNOSIS — Z720 Tobacco use: Secondary | ICD-10-CM | POA: Insufficient documentation

## 2015-07-29 DIAGNOSIS — K529 Noninfective gastroenteritis and colitis, unspecified: Secondary | ICD-10-CM | POA: Diagnosis not present

## 2015-07-29 DIAGNOSIS — R1084 Generalized abdominal pain: Secondary | ICD-10-CM | POA: Diagnosis not present

## 2015-07-29 DIAGNOSIS — R109 Unspecified abdominal pain: Secondary | ICD-10-CM | POA: Diagnosis not present

## 2015-07-29 DIAGNOSIS — Z862 Personal history of diseases of the blood and blood-forming organs and certain disorders involving the immune mechanism: Secondary | ICD-10-CM | POA: Insufficient documentation

## 2015-07-29 DIAGNOSIS — R1013 Epigastric pain: Secondary | ICD-10-CM | POA: Diagnosis not present

## 2015-07-29 LAB — URINALYSIS, ROUTINE W REFLEX MICROSCOPIC
BILIRUBIN URINE: NEGATIVE
GLUCOSE, UA: NEGATIVE mg/dL
Hgb urine dipstick: NEGATIVE
Ketones, ur: NEGATIVE mg/dL
Leukocytes, UA: NEGATIVE
NITRITE: NEGATIVE
PH: 6 (ref 5.0–8.0)
Protein, ur: NEGATIVE mg/dL
SPECIFIC GRAVITY, URINE: 1.029 (ref 1.005–1.030)
Urobilinogen, UA: 1 mg/dL (ref 0.0–1.0)

## 2015-07-29 LAB — CBC WITH DIFFERENTIAL/PLATELET
BASOS ABS: 0.1 10*3/uL (ref 0.0–0.1)
BASOS PCT: 1 %
Eosinophils Absolute: 0.5 10*3/uL (ref 0.0–0.7)
Eosinophils Relative: 7 %
HEMATOCRIT: 45.3 % (ref 39.0–52.0)
HEMOGLOBIN: 14.8 g/dL (ref 13.0–17.0)
Lymphocytes Relative: 25 %
Lymphs Abs: 1.7 10*3/uL (ref 0.7–4.0)
MCH: 27.8 pg (ref 26.0–34.0)
MCHC: 32.7 g/dL (ref 30.0–36.0)
MCV: 85.2 fL (ref 78.0–100.0)
Monocytes Absolute: 0.5 10*3/uL (ref 0.1–1.0)
Monocytes Relative: 7 %
NEUTROS ABS: 4.2 10*3/uL (ref 1.7–7.7)
NEUTROS PCT: 60 %
Platelets: 266 10*3/uL (ref 150–400)
RBC: 5.32 MIL/uL (ref 4.22–5.81)
RDW: 16.1 % — AB (ref 11.5–15.5)
WBC: 7 10*3/uL (ref 4.0–10.5)

## 2015-07-29 LAB — COMPREHENSIVE METABOLIC PANEL
ALBUMIN: 3.9 g/dL (ref 3.5–5.0)
ALK PHOS: 91 U/L (ref 38–126)
ALT: 28 U/L (ref 17–63)
AST: 31 U/L (ref 15–41)
Anion gap: 9 (ref 5–15)
BILIRUBIN TOTAL: 0.8 mg/dL (ref 0.3–1.2)
BUN: 11 mg/dL (ref 6–20)
CO2: 27 mmol/L (ref 22–32)
CREATININE: 0.85 mg/dL (ref 0.61–1.24)
Calcium: 9.4 mg/dL (ref 8.9–10.3)
Chloride: 106 mmol/L (ref 101–111)
GFR calc Af Amer: >60 mL/min (ref >60)
GLUCOSE: 135 mg/dL — AB (ref 65–99)
POTASSIUM: 3.7 mmol/L (ref 3.5–5.1)
Sodium: 142 mmol/L (ref 135–145)
TOTAL PROTEIN: 7.1 g/dL (ref 6.5–8.1)

## 2015-07-29 LAB — LIPASE, BLOOD: LIPASE: 22 U/L (ref 22–51)

## 2015-07-29 MED ORDER — IOHEXOL 300 MG/ML  SOLN
100.0000 mL | Freq: Once | INTRAMUSCULAR | Status: AC | PRN
  Administered 2015-07-29: 100 mL via INTRAVENOUS

## 2015-07-29 MED ORDER — CIPROFLOXACIN HCL 500 MG PO TABS: 500.0000 mg | ORAL_TABLET | Freq: Two times a day (BID) | ORAL | Status: DC

## 2015-07-29 MED ORDER — MORPHINE SULFATE (PF) 4 MG/ML IV SOLN
4.0000 mg | Freq: Once | INTRAVENOUS | Status: AC
  Administered 2015-07-29: 4 mg via INTRAVENOUS
  Filled 2015-07-29: qty 1

## 2015-07-29 MED ORDER — METRONIDAZOLE 500 MG PO TABS: 500.0000 mg | ORAL_TABLET | Freq: Two times a day (BID) | ORAL | Status: DC

## 2015-07-29 MED ORDER — FENTANYL CITRATE (PF) 100 MCG/2ML IJ SOLN
50.0000 ug | Freq: Once | INTRAMUSCULAR | Status: AC
  Administered 2015-07-29: 50 ug via INTRAVENOUS
  Filled 2015-07-29: qty 2

## 2015-07-29 MED ORDER — PREDNISONE 20 MG PO TABS: ORAL_TABLET | ORAL | Status: DC

## 2015-07-29 MED ORDER — HYDROCODONE-ACETAMINOPHEN 5-325 MG PO TABS: 1.0000 | ORAL_TABLET | Freq: Four times a day (QID) | ORAL | Status: DC | PRN

## 2015-07-29 MED ORDER — DEXAMETHASONE SODIUM PHOSPHATE 10 MG/ML IJ SOLN
10.0000 mg | Freq: Once | INTRAMUSCULAR | Status: AC
  Administered 2015-07-29: 10 mg via INTRAVENOUS
  Filled 2015-07-29: qty 1

## 2015-07-29 MED ORDER — SODIUM CHLORIDE 0.9 % IV BOLUS (SEPSIS)
1000.0000 mL | Freq: Once | INTRAVENOUS | Status: AC
  Administered 2015-07-29: 1000 mL via INTRAVENOUS

## 2015-07-29 NOTE — ED Notes (Signed)
Patient transported to CT 

## 2015-07-29 NOTE — Discharge Instructions (Signed)
Take medications as prescribed. Follow-up with [facility] gastroenterology for further evaluation and management of your care. Take your antibiotics as prescribed. Return to ED for worsening symptoms  Abdominal Pain, Adult Many things can cause abdominal pain. Usually, abdominal pain is not caused by a disease and will improve without treatment. It can often be observed and treated at home. Your health care provider will do a physical exam and possibly order blood tests and X-rays to help determine the seriousness of your pain. However, in many cases, more time must pass before a clear cause of the pain can be found. Before that point, your health care provider may not know if you need more testing or further treatment. HOME CARE INSTRUCTIONS Monitor your abdominal pain for any changes. The following actions may help to alleviate any discomfort you are experiencing:  Only take over-the-counter or prescription medicines as directed by your health care provider.  Do not take laxatives unless directed to do so by your health care provider.  Try a clear liquid diet (broth, tea, or water) as directed by your health care provider. Slowly move to a bland diet as tolerated. SEEK MEDICAL CARE IF:  You have unexplained abdominal pain.  You have abdominal pain associated with nausea or diarrhea.  You have pain when you urinate or have a bowel movement.  You experience abdominal pain that wakes you in the night.  You have abdominal pain that is worsened or improved by eating food.  You have abdominal pain that is worsened with eating fatty foods.  You have a fever. SEEK IMMEDIATE MEDICAL CARE IF:  Your pain does not go away within 2 hours.  You keep throwing up (vomiting).  Your pain is felt only in portions of the abdomen, such as the right side or the left lower portion of the abdomen.  You pass bloody or black tarry stools. MAKE SURE YOU:  Understand these instructions.  Will watch your  condition.  Will get help right away if you are not doing well or get worse.   This information is not intended to replace advice given to you by your health care provider. Make sure you discuss any questions you have with your health care provider.   Document Released: 07/12/2005 Document Revised: 06/23/2015 Document Reviewed: 06/11/2013 Elsevier Interactive Patient Education 2016 [employer]  Colitis Colitis is inflammation of the colon. Colitis may last a short time (acute) or it may last a long time (chronic). CAUSES This condition may be caused by:  Viruses.  Bacteria.  Reactions to medicine.  Certain autoimmune diseases, such as Crohn disease or ulcerative colitis. SYMPTOMS Symptoms of this condition include:  Diarrhea.  Passing bloody or tarry stool.  Pain.  Fever.  Vomiting.  Tiredness (fatigue).  Weight loss.  Bloating.  Sudden increase in abdominal pain.  Having fewer bowel movements than usual. DIAGNOSIS This condition is diagnosed with a stool test or a blood test. You may also have other tests, including X-rays, a CT scan, or a colonoscopy. TREATMENT Treatment may include:  Resting the bowel. This involves not eating or drinking for a period of time.  Fluids that are given through an IV tube.  Medicine for pain and diarrhea.  Antibiotic medicines.  Cortisone medicines.  Surgery. HOME CARE INSTRUCTIONS Eating and Drinking  Follow instructions from your health care provider about eating or drinking restrictions.  Drink enough fluid to keep your urine clear or pale yellow.  Work with a dietitian to determine which foods cause your  condition to flare up.  Avoid foods that cause flare-ups.  Eat a well-balanced diet. Medicines  Take over-the-counter and prescription medicines only as told by your health care provider.  If you were prescribed an antibiotic medicine, take it as told by your health care provider. Do not stop taking the  antibiotic even if you start to feel better. General Instructions  Keep all follow-up visits as told by your health care provider. This is important. SEEK MEDICAL CARE IF:  Your symptoms do not go away.  You develop new symptoms. SEEK IMMEDIATE MEDICAL CARE IF:  You have a fever that does not go away with treatment.  You develop chills.  You have extreme weakness, fainting, or dehydration.  You have repeated vomiting.  You develop severe pain in your abdomen.  You pass bloody or tarry stool.   This information is not intended to replace advice given to you by your health care provider. Make sure you discuss any questions you have with your health care provider.   Document Released: 11/09/2004 Document Revised: 06/23/2015 Document Reviewed: 01/25/2015 Elsevier Interactive Patient Education [employer].

## 2015-07-29 NOTE — ED Notes (Signed)
PA at bedside.

## 2015-07-29 NOTE — ED Notes (Signed)
Pt aware urine sample needed, urinal at bedside.

## 2015-07-29 NOTE — ED Provider Notes (Signed)
CSN: [id number]     Arrival date & time 07/29/15  0810 History   First MD Initiated Contact with Patient 07/29/15 [phone]     Chief Complaint  Patient presents with  . Rectal Bleeding  . Abdominal Pain     (Consider location/radiation/quality/duration/timing/severity/associated sxs/prior Treatment) HPI [name] is a 34 y.o. male with multiple medical problems including ulcerative colitis, multiple myeloma, sickle cell anemia, comes in for evaluation of abdominal pain and rectal bleeding. Patient states over the past 3 days he has experienced more bright red blood in the toilet bowl and on his stool. He denies a history of hemorrhoids. He reports associated upper abdominal pain that is consistent with his previous bouts of UC. He reports he is not taking any medications for his UC and is not seeing anyone for this problem. He denies any fevers at home, no nausea or vomiting, no urinary symptoms, no back pain. Rates his overall discomfort as a 7/10.  Past Medical History  Diagnosis Date  . Ulcerative colitis   . Schizophrenia ([place])   . GI (gastrointestinal bleed) 04/2013  . Crohn's disease ([place])   . Suicide attempt by drug ingestion ([place])   . Sickle cell anemia ([facility])   . Multiple myeloma [facility])    Past Surgical History  Procedure Laterality Date  . Mandible fracture surgery    . Flexible sigmoidoscopy  12/05/2011    Procedure: FLEXIBLE SIGMOIDOSCOPY;  Surgeon: Beryle Beams, MD;  Location: [facility] ENDOSCOPY;  Service: Endoscopy;  Laterality: N/A;  . Esophagogastroduodenoscopy N/A 05/24/2013    Procedure: ESOPHAGOGASTRODUODENOSCOPY (EGD);  Surgeon: Ladene Artist, MD;  Location: [facility] ENDOSCOPY;  Service: Endoscopy;  Laterality: N/A;   Family History  Problem Relation Age of Onset  . Liver cancer     Social History  Substance Use Topics  . Smoking status: Current Every Day Smoker -- 0.25 packs/day for 20 years    Types: Cigarettes  . Smokeless tobacco: Never Used  . Alcohol Use: Yes   Comment: quit    Review of Systems A 10 point review of systems was completed and was negative except for pertinent positives and negatives as mentioned in the history of present illness     Allergies  Depakote; Lithium; Nsaids; and Risperidone and related  Home Medications   Prior to Admission medications   Medication Sig Start Date End Date Taking? Authorizing Provider  acetaminophen (TYLENOL) 500 MG tablet Take 1,000 mg by mouth every 6 (six) hours as needed for moderate pain.    Historical Provider, MD  ciprofloxacin (CIPRO) 500 MG tablet Take 1 tablet (500 mg total) by mouth 2 (two) times daily. 07/29/15   Comer Locket, PA-C  HYDROcodone-acetaminophen (NORCO/VICODIN) 5-325 MG tablet Take 1-2 tablets by mouth every 6 (six) hours as needed. 07/29/15   Comer Locket, PA-C  metroNIDAZOLE (FLAGYL) 500 MG tablet Take 1 tablet (500 mg total) by mouth 2 (two) times daily. 07/29/15   Comer Locket, PA-C  olanzapine zydis (ZYPREXA) 15 MG disintegrating tablet Take 1 tablet by mouth daily. 03/02/15   Historical Provider, MD  ondansetron (ZOFRAN ODT) 8 MG disintegrating tablet 50m ODT q4 hours prn nausea Patient not taking: Reported on 06/23/2015 03/22/15   OLinton Flemings MD  ondansetron (ZOFRAN ODT) 8 MG disintegrating tablet Take 1 tablet (8 mg total) by mouth every 8 (eight) hours as needed for nausea or vomiting. Patient not taking: Reported on 07/29/2015 06/23/15   Tatyana Kirichenko, PA-C  Phenyleph-Doxylamine-DM-APAP (NYQUIL SEVERE COLD/FLU) 5-6.25-10-325 MG/15ML LIQD Take 30  mLs by mouth 3 (three) times daily.    Historical Provider, MD  predniSONE (DELTASONE) 20 MG tablet 3 tabs po day one, then 2 tabs daily x 4 days 07/29/15   Comer Locket, PA-C  promethazine (PHENERGAN) 25 MG tablet Take 1 tablet (25 mg total) by mouth every 6 (six) hours as needed for nausea or vomiting. Patient not taking: Reported on 06/23/2015 03/25/15   Orpah Greek, MD   BP 138/83 mmHg  Pulse 69   Temp(Src) 98 F (36.7 C) (Oral)  Resp 12  SpO2 95% Physical Exam  Constitutional: He is oriented to person, place, and time. He appears well-developed and well-nourished.  HENT:  Head: Normocephalic and atraumatic.  Mouth/Throat: Oropharynx is clear and moist.  Eyes: Conjunctivae are normal. Pupils are equal, round, and reactive to light. Right eye exhibits no discharge. Left eye exhibits no discharge. No scleral icterus.  Neck: Neck supple.  Cardiovascular: Normal rate, regular rhythm and normal heart sounds.   Pulmonary/Chest: Effort normal and breath sounds normal. No respiratory distress. He has no wheezes. He has no rales.  Abdominal: Soft.  Mild diffuse tenderness to palpation in bilateral upper abdomen and epigastrium. Abdomen is otherwise soft, nondistended without rebound or guarding. No peritoneal signs. No other lesions or deformities  Musculoskeletal: He exhibits no tenderness.  Neurological: He is alert and oriented to person, place, and time.  Cranial Nerves II-XII grossly intact  Skin: Skin is warm and dry. No rash noted.  Psychiatric: He has a normal mood and affect.  Nursing note and vitals reviewed.   ED Course  Procedures (including critical care time) Labs Review Labs Reviewed  CBC WITH DIFFERENTIAL/PLATELET - Abnormal; Notable for the following:    RDW 16.1 (*)    All other components within normal limits  COMPREHENSIVE METABOLIC PANEL - Abnormal; Notable for the following:    Glucose, Bld 135 (*)    All other components within normal limits  LIPASE, BLOOD  URINALYSIS, ROUTINE W REFLEX MICROSCOPIC (NOT AT [facility])    Imaging Review Ct Abdomen Pelvis W Contrast  07/29/2015  CLINICAL DATA:  Abdominal pain, rectal bleeding for 3 days EXAM: CT ABDOMEN AND PELVIS WITH CONTRAST TECHNIQUE: Multidetector CT imaging of the abdomen and pelvis was performed using the standard protocol following bolus administration of intravenous contrast. CONTRAST:  187m OMNIPAQUE  IOHEXOL 300 MG/ML  SOLN COMPARISON:  03/25/2015 FINDINGS: Lower chest:  Clear lung bases.  Normal heart size. Hepatobiliary: Normal liver. No hepatic mass. No intrahepatic or extrahepatic biliary ductal dilatation. Normal gallbladder. Pancreas: Normal. Spleen: Normal. Adrenals/Urinary Tract: Normal adrenal glands. Normal kidneys. No urolithiasis or obstructive uropathy. Normal decompressed bladder. Stomach/Bowel: There is relative thickening of the rectosigmoid colon which may be secondary to underdistention given the lack of surrounding inflammatory changes versus mild colitis. No bowel wall thickening or dilatation. No pneumoperitoneum, pneumatosis or portal venous gas. No abdominal or pelvic free fluid. Normal appendix. Vascular/Lymphatic: Normal caliber abdominal aorta. Prominent left para-aortic lymph node measuring 9 mm in short axis. Multiple subcentimeter retroperitoneal lymph nodes are present. Other: No fluid collection or hematoma. Musculoskeletal: No acute osseous abnormality. No lytic or sclerotic osseous lesion. IMPRESSION: 1. Relative thickening of the rectosigmoid colon which may be secondary to underdistention given the lack of surrounding inflammatory changes versus mild colitis. 2. Nonspecific mild retroperitoneal lymphadenopathy similar in appearance to the prior exam. Electronically Signed   By: HKathreen Devoid  On: 07/29/2015 10:52   I have personally reviewed and evaluated these images and  lab results as part of my medical decision-making.   EKG Interpretation None     Meds given in ED:  Medications  sodium chloride 0.9 % bolus 1,000 mL (0 mLs Intravenous Stopped 07/29/15 1040)  fentaNYL (SUBLIMAZE) injection 50 mcg (50 mcg Intravenous Given 07/29/15 0900)  iohexol (OMNIPAQUE) 300 MG/ML solution 100 mL (100 mLs Intravenous Contrast Given 07/29/15 1008)  morphine 4 MG/ML injection 4 mg (4 mg Intravenous Given 07/29/15 1037)  dexamethasone (DECADRON) injection 10 mg (10 mg  Intravenous Given 07/29/15 1133)    Discharge Medication List as of 07/29/2015 11:22 AM    START taking these medications   Details  ciprofloxacin (CIPRO) 500 MG tablet Take 1 tablet (500 mg total) by mouth 2 (two) times daily., Starting 07/29/2015, Until Discontinued, Print    metroNIDAZOLE (FLAGYL) 500 MG tablet Take 1 tablet (500 mg total) by mouth 2 (two) times daily., Starting 07/29/2015, Until Discontinued, Print       Filed Vitals:   07/29/15 1036 07/29/15 1100 07/29/15 1130 07/29/15 1136  BP: 140/75 138/78 138/83   Pulse: 87 73 69   Temp:    98 F (36.7 C)  TempSrc:    Oral  Resp:      SpO2: 97% 98% 95%     MDM  Vitals stable - WNL -afebrile Pt resting comfortably in ED. PE--benign abdominal exam. Mild, diffuse tenderness in upper abdomen and epigastrium. Labwork-labs are grossly unremarkable. Imaging-the patient's, located past medical history, current abdominal discomfort will obtain CT abdomen for better appreciation of patient's symptoms. CT abdomen shows a relative thickening of rectosigmoid colon without any inflammatory changes versus a mild colitis.  DDX--patient with a history of UC, multiple myeloma and sickle cell comes in for evaluation of abdominal pain similar to previous UC flares. Patient does not have any follow-up. Found to have mild colitis versus enlarged rectosigmoid colon. Will give steroids in the ED. Discharge with antibiotics, steroid taper, referral to GI for further follow-up. No evidence of other acute or emergent pathology at this time. Overall, patient appears well, nontoxic, hemodynamically stable with normal vital signs and is appropriate for discharge. I discussed all relevant lab findings and imaging results with pt and they verbalized understanding. Discussed f/u with PCP within 48 hrs and return precautions, pt very amenable to plan. Prior to patient discharge, I discussed and reviewed this case with Dr.Cook, who also saw and evaluated  the patient.    Final diagnoses:  Abdominal discomfort  Colitis        Comer Locket, PA-C 07/29/15 [place], MD 07/29/15 1205

## 2015-07-29 NOTE — ED Notes (Signed)
Pt arrives via POV from home with rectal bleeding and abdominal pain for the last 3 days. Pt with history of ulcerative colitis. Denies recent fever. Pt ambulatory, NAD at present.

## 2015-10-25 ENCOUNTER — Emergency Department (HOSPITAL_COMMUNITY)
Admission: EM | Admit: 2015-10-25 | Discharge: 2015-10-25 | Disposition: A | Discharge status: Home / Self Care | Payer: Medicare Other | Attending: Emergency Medicine | Admitting: Emergency Medicine

## 2015-10-25 ENCOUNTER — Encounter (HOSPITAL_COMMUNITY): Payer: Self-pay | Admitting: Family Medicine

## 2015-10-25 ENCOUNTER — Emergency Department (HOSPITAL_COMMUNITY): Payer: Medicare Other

## 2015-10-25 DIAGNOSIS — F17210 Nicotine dependence, cigarettes, uncomplicated: Secondary | ICD-10-CM | POA: Insufficient documentation

## 2015-10-25 DIAGNOSIS — R42 Dizziness and giddiness: Secondary | ICD-10-CM | POA: Insufficient documentation

## 2015-10-25 DIAGNOSIS — R112 Nausea with vomiting, unspecified: Secondary | ICD-10-CM | POA: Diagnosis present

## 2015-10-25 DIAGNOSIS — F209 Schizophrenia, unspecified: Secondary | ICD-10-CM | POA: Insufficient documentation

## 2015-10-25 DIAGNOSIS — K529 Noninfective gastroenteritis and colitis, unspecified: Secondary | ICD-10-CM | POA: Insufficient documentation

## 2015-10-25 DIAGNOSIS — Z8719 Personal history of other diseases of the digestive system: Secondary | ICD-10-CM | POA: Diagnosis not present

## 2015-10-25 DIAGNOSIS — Z79899 Other long term (current) drug therapy: Secondary | ICD-10-CM | POA: Insufficient documentation

## 2015-10-25 DIAGNOSIS — Z862 Personal history of diseases of the blood and blood-forming organs and certain disorders involving the immune mechanism: Secondary | ICD-10-CM | POA: Insufficient documentation

## 2015-10-25 DIAGNOSIS — R1033 Periumbilical pain: Secondary | ICD-10-CM | POA: Diagnosis not present

## 2015-10-25 LAB — CBC
HEMATOCRIT: 53 % — AB (ref 39.0–52.0)
HEMOGLOBIN: 17.6 g/dL — AB (ref 13.0–17.0)
MCH: 29 pg (ref 26.0–34.0)
MCHC: 33.2 g/dL (ref 30.0–36.0)
MCV: 87.3 fL (ref 78.0–100.0)
Platelets: 284 10*3/uL (ref 150–400)
RBC: 6.07 MIL/uL — ABNORMAL HIGH (ref 4.22–5.81)
RDW: 15.3 % (ref 11.5–15.5)
WBC: 13.6 10*3/uL — ABNORMAL HIGH (ref 4.0–10.5)

## 2015-10-25 LAB — COMPREHENSIVE METABOLIC PANEL
ALBUMIN: 4.4 g/dL (ref 3.5–5.0)
ALT: 22 U/L (ref 17–63)
ANION GAP: 13 (ref 5–15)
AST: 27 U/L (ref 15–41)
Alkaline Phosphatase: 115 U/L (ref 38–126)
BUN: 11 mg/dL (ref 6–20)
CHLORIDE: 103 mmol/L (ref 101–111)
CO2: 24 mmol/L (ref 22–32)
Calcium: 9.3 mg/dL (ref 8.9–10.3)
Creatinine, Ser: 1.05 mg/dL (ref 0.61–1.24)
GFR calc Af Amer: >60 mL/min (ref >60)
GFR calc non Af Amer: >60 mL/min (ref >60)
GLUCOSE: 101 mg/dL — AB (ref 65–99)
POTASSIUM: 4.1 mmol/L (ref 3.5–5.1)
SODIUM: 140 mmol/L (ref 135–145)
TOTAL PROTEIN: 8.2 g/dL — AB (ref 6.5–8.1)
Total Bilirubin: 1.3 mg/dL — ABNORMAL HIGH (ref 0.3–1.2)

## 2015-10-25 LAB — URINALYSIS, ROUTINE W REFLEX MICROSCOPIC
BILIRUBIN URINE: NEGATIVE
Glucose, UA: NEGATIVE mg/dL
Hgb urine dipstick: NEGATIVE
Ketones, ur: NEGATIVE mg/dL
Leukocytes, UA: NEGATIVE
NITRITE: NEGATIVE
PH: 6 (ref 5.0–8.0)
Protein, ur: NEGATIVE mg/dL
SPECIFIC GRAVITY, URINE: 1.031 — AB (ref 1.005–1.030)

## 2015-10-25 LAB — LIPASE, BLOOD: LIPASE: 22 U/L (ref 11–51)

## 2015-10-25 MED ORDER — CIPROFLOXACIN HCL 500 MG PO TABS: 500.0000 mg | ORAL_TABLET | Freq: Two times a day (BID) | ORAL | Status: DC

## 2015-10-25 MED ORDER — METHYLPREDNISOLONE SODIUM SUCC 125 MG IJ SOLR
125.0000 mg | Freq: Once | INTRAMUSCULAR | Status: AC
  Administered 2015-10-25: 125 mg via INTRAVENOUS
  Filled 2015-10-25: qty 2

## 2015-10-25 MED ORDER — CIPROFLOXACIN IN D5W 400 MG/200ML IV SOLN
400.0000 mg | Freq: Once | INTRAVENOUS | Status: AC
  Administered 2015-10-25: 400 mg via INTRAVENOUS
  Filled 2015-10-25: qty 200

## 2015-10-25 MED ORDER — METRONIDAZOLE IN NACL 5-0.79 MG/ML-% IV SOLN
500.0000 mg | Freq: Once | INTRAVENOUS | Status: AC
  Administered 2015-10-25: 500 mg via INTRAVENOUS
  Filled 2015-10-25: qty 100

## 2015-10-25 MED ORDER — IOHEXOL 300 MG/ML  SOLN
80.0000 mL | Freq: Once | INTRAMUSCULAR | Status: AC | PRN
  Administered 2015-10-25: 100 mL via INTRAVENOUS

## 2015-10-25 MED ORDER — PROMETHAZINE HCL 25 MG/ML IJ SOLN
25.0000 mg | Freq: Once | INTRAMUSCULAR | Status: AC
  Administered 2015-10-25: 25 mg via INTRAVENOUS
  Filled 2015-10-25: qty 1

## 2015-10-25 MED ORDER — PREDNISONE 10 MG PO TABS: 20.0000 mg | ORAL_TABLET | Freq: Every day | ORAL | Status: DC

## 2015-10-25 MED ORDER — METRONIDAZOLE 500 MG PO TABS: 500.0000 mg | ORAL_TABLET | Freq: Two times a day (BID) | ORAL | Status: DC

## 2015-10-25 MED ORDER — SODIUM CHLORIDE 0.9 % IV BOLUS (SEPSIS)
1000.0000 mL | Freq: Once | INTRAVENOUS | Status: AC
  Administered 2015-10-25: 1000 mL via INTRAVENOUS

## 2015-10-25 MED ORDER — MORPHINE SULFATE (PF) 2 MG/ML IV SOLN
2.0000 mg | Freq: Once | INTRAVENOUS | Status: AC
  Administered 2015-10-25: 2 mg via INTRAVENOUS
  Filled 2015-10-25: qty 1

## 2015-10-25 NOTE — ED Notes (Signed)
Pt hard stick getting phlebotomy to try

## 2015-10-25 NOTE — ED Notes (Signed)
Back from CT

## 2015-10-25 NOTE — ED Provider Notes (Signed)
CSN: [id number]     Arrival date & time 10/25/15  1122 History   First MD Initiated Contact with Patient 10/25/15 1322     Chief Complaint  Patient presents with  . Nausea  . Emesis  . Dizziness     (Consider location/radiation/quality/duration/timing/severity/associated sxs/prior Treatment) HPI  34-year-old male history of schizophrenia and ulcerative colitis presents today stating he has had nausea vomiting and diarrhea that began last night. He describes the vomiting as similar to liquids he has taken in and up to 60 times. He complains of diffuse generalized abdominal pain. He has not had a fever that he knows of. He describes 5 loose stools with some bloody discoloration. He feels this is similar to his previous ulcerative colitis episodes.  Past Medical History  Diagnosis Date  . Ulcerative colitis   . Schizophrenia ([facility])   . GI (gastrointestinal bleed) 04/2013  . Crohn's disease ([facility])   . Suicide attempt by drug ingestion ([facility])   . Sickle cell anemia ([facility])   . Multiple myeloma ([facility])    Past Surgical History  Procedure Laterality Date  . Mandible fracture surgery    . Flexible sigmoidoscopy  12/05/2011    Procedure: FLEXIBLE SIGMOIDOSCOPY;  Surgeon: Patrick D Hung, MD;  Location: [facility] ENDOSCOPY;  Service: Endoscopy;  Laterality: N/A;  . Esophagogastroduodenoscopy N/A 05/24/2013    Procedure: ESOPHAGOGASTRODUODENOSCOPY (EGD);  Surgeon: Malcolm T Stark, MD;  Location: [facility] ENDOSCOPY;  Service: Endoscopy;  Laterality: N/A;   Family History  Problem Relation Age of Onset  . Liver cancer     Social History  Substance Use Topics  . Smoking status: Current Every Day Smoker -- 0.25 packs/day for 20 years    Types: Cigarettes  . Smokeless tobacco: Never Used  . Alcohol Use: Yes     Comment: quit    Review of Systems  All other systems reviewed and are negative.     Allergies  Depakote; Lithium; Nsaids; and Risperidone and related  Home Medications   Prior to Admission  medications   Medication Sig Start Date End Date Taking? Authorizing Provider  acetaminophen (TYLENOL) 500 MG tablet Take 1,000 mg by mouth every 6 (six) hours as needed for moderate pain.   Yes Historical Provider, MD  OLANZapine (ZYPREXA) 20 MG tablet Take 20 mg by mouth at bedtime.   Yes Historical Provider, MD   BP 121/97 mmHg  Pulse 112  Temp(Src) 97.8 F (36.6 C) (Oral)  Resp 18  SpO2 100% Physical Exam  Constitutional: He is oriented to person, place, and time. He appears well-developed and well-nourished.  HENT:  Head: Normocephalic and atraumatic.  Right Ear: External ear normal.  Left Ear: External ear normal.  Nose: Nose normal.  Mouth/Throat: Oropharynx is clear and moist.  Eyes: Conjunctivae and EOM are normal. Pupils are equal, round, and reactive to light.  Neck: Normal range of motion. Neck supple.  Cardiovascular: Normal rate, regular rhythm, normal heart sounds and intact distal pulses.   Pulmonary/Chest: Effort normal and breath sounds normal. No respiratory distress. He has no wheezes. He exhibits no tenderness.  Abdominal: Soft. Bowel sounds are normal. He exhibits no distension and no mass. There is no tenderness. There is no guarding.  Mild diffuse tenderness palpation no rebound, masses, or guarding Bowel sounds are present  Musculoskeletal: Normal range of motion.  Neurological: He is alert and oriented to person, place, and time. He has normal reflexes. He exhibits normal muscle tone. Coordination normal.  Skin: Skin is warm and dry.    Psychiatric: He has a normal mood and affect. His behavior is normal. Judgment and thought content normal.  Nursing note and vitals reviewed.   ED Course  Procedures (including critical care time) Labs Review Labs Reviewed  COMPREHENSIVE METABOLIC PANEL - Abnormal; Notable for the following:    Glucose, Bld 101 (*)    Total Protein 8.2 (*)    Total Bilirubin 1.3 (*)    All other components within normal limits  CBC -  Abnormal; Notable for the following:    WBC 13.6 (*)    RBC 6.07 (*)    Hemoglobin 17.6 (*)    HCT 53.0 (*)    All other components within normal limits  LIPASE, BLOOD  URINALYSIS, ROUTINE W REFLEX MICROSCOPIC (NOT AT [facility])    Imaging Review Ct Abdomen Pelvis W Contrast  10/25/2015  CLINICAL DATA:  Periumbilical pain with nausea and vomiting. Blood in stool for several days. EXAM: CT ABDOMEN AND PELVIS WITH CONTRAST TECHNIQUE: Multidetector CT imaging of the abdomen and pelvis was performed using the standard protocol following bolus administration of intravenous contrast. CONTRAST:  80 mL OMNIPAQUE IOHEXOL 300 MG/ML  SOLN COMPARISON:  July 29, 2015 FINDINGS: Lower chest: There is mild atelectatic change in both lower lobe regions. Lung bases otherwise are clear. Hepatobiliary: No focal liver lesions are identified. There is no gallbladder wall thickening. There is no biliary duct dilatation. Pancreas: There is no pancreatic mass or inflammatory focus. The pancreatic duct is upper normal in size, stable. Spleen: No splenic lesions are identified. There is a small splenule inferior to the spleen, stable. Adrenals/Urinary Tract: No adrenal lesions are identified. There is no renal mass or hydronephrosis on either side. There is no renal or ureteral calculus on either side. Urinary bladder is midline with wall thickness within normal limits. Stomach/Bowel: There is perirectal stranding, slightly more on the left than on the right. There is slight wall thickening in the distal sigmoid colon and rectal regions. No fistula is seen. There is no other bowel wall thickening. There is no mesenteric thickening apart from the perirectal region stranding. There is no bowel obstruction. No free air or portal venous air. Vascular/Lymphatic: There is no abdominal aortic aneurysm. The major mesenteric vessels are patent. There is a retro- aortic left renal vein, an anatomic variant. There is stable mild lymph node  prominence in the periaortic region bilaterally, with the largest individual lymph node measuring 1 1.4 x 0.9 cm, stable. There is node new lymph node prominence in the abdomen or pelvis. Reproductive: Prostate and seminal vesicles appear normal. There is no pelvic mass or pelvic fluid collection. Other: Appendix appears normal. No abscess or ascites in the abdomen or pelvis. There is a stable focus of localize fat to the left of the rectum measuring 2.4 x 0.9 cm, consistent with a perirectal lipoma. Musculoskeletal: There are no blastic or lytic bone lesions. There is no intramuscular or abdominal wall lesion. IMPRESSION: There is mild distal sigmoid and rectal wall thickening with perirectal stranding in the adjacent soft tissues consistent with inflammation in this area. These changes may be consistent with inflammatory bowel disease. No abscess or abnormal fluid collection is noted in this region. Bowel elsewhere appears unremarkable. Small left perirectal lipoma, stable. Multiple mildly prominent lymph nodes are noted in the periaortic region, stable. No new adenopathy. Electronically Signed   By: William  Woodruff III M.D.   On: 10/25/2015 15:26   I have personally reviewed and evaluated these images and lab results as   part of my medical decision-making.   EKG Interpretation None      MDM   Final diagnoses:  Colitis   34-year-old male with nausea vomiting diarrhea who presents with some abdominal pain. Has a history of ulcerative colitis and he reveals mild distal sigmoid and rectal wall thickening with perirectal stranding without evidence of abscess or any abnormal fluid collections. Patient has tolerated by mouth here. He is being treated with Cipro and Flagyl and Solu-Medrol. Heart rate is increased to 112 initially but has responded well to IV fluids. Plan outpatient treatment with prednisone, Cipro, Flagyl and referred to follow up with GI     , MD 10/31/15 1232 

## 2015-10-25 NOTE — ED Notes (Signed)
Pt here for N,V,D and light headed that started yesterday

## 2015-10-25 NOTE — Discharge Instructions (Signed)
Ulcerative Colitis, Adult Ulcerative colitis is long-lasting (chronic) swelling (inflammation) of the large intestine (colon). Sores (ulcers) may also form on the colon.  Ulcerative colitis is closely related to another condition of inflammation of the intestines that is called Crohn disease. Together, they are frequently referred to as inflammatory bowel disease (IBD).  CAUSES  Ulcerative colitis is caused by increased activity of the immune system in the intestines. The immune system is the system that protects the body against harmful bacteria, viruses, fungi, and other things that can make you sick. When the immune system overacts, it causes inflammation. The cause of the increased immune system activity is not known.  RISK FACTORS Risk factors of ulcerative colitis include:   Age. This includes:  Being 27-53 years old.  Being older than 37 years old.  Having a family history of ulcerative colitis.   Being of Jewish descent. SIGNS AND SYMPTOMS  Common symptoms of ulcerative colitis include rectal bleeding and diarrhea. There is a wide range of symptoms, and a person's symptoms depend on how severe the condition is. Additional symptoms may include:   Pain or cramping in the belly (abdomen).   Fever.   Fatigue.   Weight loss.   Night sweats.   Rectal pain.   Feeling the immediate need to have a bowel movement.   Nausea.  Loss of appetite.  Anemia.  Joint pain or soreness.  Eye irritation.  Certain skin rashes. DIAGNOSIS  Ulcerative colitis may be diagnosed by:  Medical history and physical exam.  Blood tests and stool tests.  X-rays.  CT scans.  Colonoscopy. For this test, a flexible tube is inserted into your anus and your colon is examined.  Examination of a tissue sample from your colon (biopsy). TREATMENT  Treatment for ulcerative colitis may include medicines to:  Decrease inflammation.  Control your immune system. Surgery may also be  necessary.  HOME CARE INSTRUCTIONS  Medicines and Vitamins  Take medicines only as directed by your doctor. Do not take aspirin.  Ask your doctor if you should take any vitamins or supplements. Lifestyle  Exercise regularly.  Limit alcohol intake to no more than 1 drink per day for nonpregnant women and 2 drinks per day for men. One drink equals 12 ounces of beer, 5 ounces of wine, or 1 ounces of hard liquor. Eating and Drinking  Drink enough fluid to keep your urine clear or pale yellow.  Ask your health care provider about the best diet for you. Follow the diet as directed by your health care provider. This may include:  Avoiding carbonated drinks.  Avoiding popcorn, vegetable skins, nuts, and other high-fiber foods when you have symptoms of ulcerative colitis.  Eating smaller meals more often.  Keeping a food diary. This may help you to find and avoid any foods that make you feel not well.  Limit your caffeine intake. General Instructions  Keep all follow-up appointments as directed by your health care provider. This is important. SEEK MEDICAL CARE IF:   Your symptoms do not improve or get worse with treatment.   You continue to lose weight.  You have constant cramps or loose bowels.   You develop a new skin rash, skin sores, or eye problems.  You have a fever or chills. SEEK IMMEDIATE MEDICAL CARE IF:   You have bloody diarrhea.   You have severe pain in your abdomen.   You vomit.   This information is not intended to replace advice given to you by your  health care provider. Make sure you discuss any questions you have with your health care provider.   Document Released: 07/12/2005 Document Revised: 06/23/2015 Document Reviewed: 05/20/2014 Elsevier Interactive Patient Education [employer].

## 2015-11-10 DIAGNOSIS — F209 Schizophrenia, unspecified: Secondary | ICD-10-CM | POA: Diagnosis not present

## 2015-11-20 ENCOUNTER — Encounter (HOSPITAL_COMMUNITY): Payer: Self-pay | Admitting: Emergency Medicine

## 2015-11-20 ENCOUNTER — Emergency Department (HOSPITAL_COMMUNITY)
Admission: EM | Admit: 2015-11-20 | Discharge: 2015-11-21 | Disposition: A | Discharge status: Home / Self Care | Payer: Medicare Other | Attending: Emergency Medicine | Admitting: Emergency Medicine

## 2015-11-20 ENCOUNTER — Emergency Department (HOSPITAL_COMMUNITY): Payer: Medicare Other

## 2015-11-20 DIAGNOSIS — Y92410 Unspecified street and highway as the place of occurrence of the external cause: Secondary | ICD-10-CM | POA: Diagnosis not present

## 2015-11-20 DIAGNOSIS — F209 Schizophrenia, unspecified: Secondary | ICD-10-CM | POA: Diagnosis not present

## 2015-11-20 DIAGNOSIS — Z862 Personal history of diseases of the blood and blood-forming organs and certain disorders involving the immune mechanism: Secondary | ICD-10-CM | POA: Diagnosis not present

## 2015-11-20 DIAGNOSIS — M546 Pain in thoracic spine: Secondary | ICD-10-CM | POA: Diagnosis not present

## 2015-11-20 DIAGNOSIS — Y9389 Activity, other specified: Secondary | ICD-10-CM | POA: Insufficient documentation

## 2015-11-20 DIAGNOSIS — S92354A Nondisplaced fracture of fifth metatarsal bone, right foot, initial encounter for closed fracture: Secondary | ICD-10-CM | POA: Diagnosis not present

## 2015-11-20 DIAGNOSIS — M25551 Pain in right hip: Secondary | ICD-10-CM | POA: Diagnosis not present

## 2015-11-20 DIAGNOSIS — S79912A Unspecified injury of left hip, initial encounter: Secondary | ICD-10-CM | POA: Diagnosis not present

## 2015-11-20 DIAGNOSIS — M25561 Pain in right knee: Secondary | ICD-10-CM | POA: Diagnosis not present

## 2015-11-20 DIAGNOSIS — S29002A Unspecified injury of muscle and tendon of back wall of thorax, initial encounter: Secondary | ICD-10-CM | POA: Diagnosis not present

## 2015-11-20 DIAGNOSIS — R52 Pain, unspecified: Secondary | ICD-10-CM

## 2015-11-20 DIAGNOSIS — S8991XA Unspecified injury of right lower leg, initial encounter: Secondary | ICD-10-CM | POA: Insufficient documentation

## 2015-11-20 DIAGNOSIS — Y998 Other external cause status: Secondary | ICD-10-CM | POA: Diagnosis not present

## 2015-11-20 DIAGNOSIS — F17210 Nicotine dependence, cigarettes, uncomplicated: Secondary | ICD-10-CM | POA: Diagnosis not present

## 2015-11-20 DIAGNOSIS — S9001XA Contusion of right ankle, initial encounter: Secondary | ICD-10-CM | POA: Diagnosis not present

## 2015-11-20 DIAGNOSIS — S79911A Unspecified injury of right hip, initial encounter: Secondary | ICD-10-CM | POA: Insufficient documentation

## 2015-11-20 DIAGNOSIS — S299XXA Unspecified injury of thorax, initial encounter: Secondary | ICD-10-CM | POA: Diagnosis not present

## 2015-11-20 DIAGNOSIS — Z8579 Personal history of other malignant neoplasms of lymphoid, hematopoietic and related tissues: Secondary | ICD-10-CM | POA: Diagnosis not present

## 2015-11-20 DIAGNOSIS — Z8719 Personal history of other diseases of the digestive system: Secondary | ICD-10-CM | POA: Diagnosis not present

## 2015-11-20 DIAGNOSIS — M7989 Other specified soft tissue disorders: Secondary | ICD-10-CM | POA: Diagnosis not present

## 2015-11-20 DIAGNOSIS — S92351A Displaced fracture of fifth metatarsal bone, right foot, initial encounter for closed fracture: Secondary | ICD-10-CM | POA: Diagnosis not present

## 2015-11-20 DIAGNOSIS — S99921A Unspecified injury of right foot, initial encounter: Secondary | ICD-10-CM | POA: Diagnosis present

## 2015-11-20 MED ORDER — OXYCODONE-ACETAMINOPHEN 5-325 MG PO TABS
ORAL_TABLET | ORAL | Status: AC
Start: 2015-11-20 — End: 2015-11-21
  Filled 2015-11-20: qty 1

## 2015-11-20 MED ORDER — OXYCODONE-ACETAMINOPHEN 5-325 MG PO TABS
1.0000 | ORAL_TABLET | Freq: Once | ORAL | Status: AC
  Administered 2015-11-20: 1 via ORAL

## 2015-11-20 MED ORDER — OXYCODONE-ACETAMINOPHEN 5-325 MG PO TABS: 1.0000 | ORAL_TABLET | Freq: Four times a day (QID) | ORAL | Status: DC | PRN

## 2015-11-20 MED ORDER — HYDROMORPHONE HCL 1 MG/ML IJ SOLN
1.0000 mg | Freq: Once | INTRAMUSCULAR | Status: AC
  Administered 2015-11-20: 1 mg via INTRAMUSCULAR
  Filled 2015-11-20: qty 1
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
## 2015-11-20 NOTE — Progress Notes (Signed)
Orthopedic Tech Progress Note Patient Details:  [name] [birth year] [id number]  Ortho Devices Type of Ortho Device: Ace wrap, Crutches, Post (short leg) splint Ortho Device/Splint Location: RLE Ortho Device/Splint Interventions: Ordered, Application   [name] 11/20/2015, 6:24 PM
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
## 2015-11-24 ENCOUNTER — Encounter (HOSPITAL_COMMUNITY): Payer: Self-pay | Admitting: Family Medicine

## 2015-11-24 ENCOUNTER — Emergency Department (HOSPITAL_COMMUNITY)
Admission: EM | Admit: 2015-11-24 | Discharge: 2015-11-24 | Disposition: A | Discharge status: Home / Self Care | Payer: Medicare Other | Attending: Emergency Medicine | Admitting: Emergency Medicine

## 2015-11-24 DIAGNOSIS — F209 Schizophrenia, unspecified: Secondary | ICD-10-CM | POA: Insufficient documentation

## 2015-11-24 DIAGNOSIS — Z8719 Personal history of other diseases of the digestive system: Secondary | ICD-10-CM | POA: Diagnosis not present

## 2015-11-24 DIAGNOSIS — S92354S Nondisplaced fracture of fifth metatarsal bone, right foot, sequela: Secondary | ICD-10-CM | POA: Diagnosis not present

## 2015-11-24 DIAGNOSIS — Z8579 Personal history of other malignant neoplasms of lymphoid, hematopoietic and related tissues: Secondary | ICD-10-CM | POA: Insufficient documentation

## 2015-11-24 DIAGNOSIS — Z862 Personal history of diseases of the blood and blood-forming organs and certain disorders involving the immune mechanism: Secondary | ICD-10-CM | POA: Insufficient documentation

## 2015-11-24 DIAGNOSIS — Z79899 Other long term (current) drug therapy: Secondary | ICD-10-CM | POA: Insufficient documentation

## 2015-11-24 DIAGNOSIS — W2209XS Striking against other stationary object, sequela: Secondary | ICD-10-CM | POA: Diagnosis not present

## 2015-11-24 DIAGNOSIS — F17210 Nicotine dependence, cigarettes, uncomplicated: Secondary | ICD-10-CM | POA: Insufficient documentation

## 2015-11-24 DIAGNOSIS — S92901S Unspecified fracture of right foot, sequela: Secondary | ICD-10-CM | POA: Insufficient documentation

## 2015-11-24 DIAGNOSIS — S93304S Unspecified dislocation of right foot, sequela: Secondary | ICD-10-CM | POA: Insufficient documentation

## 2015-11-24 DIAGNOSIS — Z915 Personal history of self-harm: Secondary | ICD-10-CM | POA: Insufficient documentation

## 2015-11-24 DIAGNOSIS — M79671 Pain in right foot: Secondary | ICD-10-CM | POA: Diagnosis present

## 2015-11-24 MED ORDER — TRAMADOL HCL 50 MG PO TABS: 50.0000 mg | ORAL_TABLET | Freq: Four times a day (QID) | ORAL | Status: DC | PRN

## 2015-11-24 MED ORDER — METHOCARBAMOL 500 MG PO TABS: 500.0000 mg | ORAL_TABLET | Freq: Two times a day (BID) | ORAL | Status: DC
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
## 2015-11-24 NOTE — ED Notes (Signed)
Pt here for pain to right foot. Bandage coming off. sts he has broken bones and needs a new bandage on foot.

## 2015-11-24 NOTE — Discharge Instructions (Signed)
Metatarsal Fracture A metatarsal fracture is a break in a metatarsal bone. Metatarsal bones connect your toe bones to your ankle bones. CAUSES This type of fracture may be caused by:  A sudden twisting of your foot.  A fall onto your foot.  Overuse or repetitive exercise. RISK FACTORS This condition is more likely to develop in people who:  Play contact sports.  Have a bone disease.  Have a low calcium level. SYMPTOMS Symptoms of this condition include:  Pain that is worse when walking or standing.  Pain when pressing on the foot or moving the toes.  Swelling.  Bruising on the top or bottom of the foot.  A foot that appears shorter than the other one. DIAGNOSIS This condition is diagnosed with a physical exam. You may also have imaging tests, such as:  X-rays.  A CT scan.  MRI. TREATMENT Treatment for this condition depends on its severity and whether a bone has moved out of place. Treatment may involve:  Rest.  Wearing foot support such as a cast, splint, or boot for several weeks.  Using crutches.  Surgery to move bones back into the right position. Surgery is usually needed if there are many pieces of broken bone or bones that are very out of place (displaced fracture).  Physical therapy. This may be needed to help you regain full movement and strength in your foot. You will need to return to your health care provider to have X-rays taken until your bones heal. Your health care provider will look at the X-rays to make sure that your foot is healing well. HOME CARE INSTRUCTIONS  If You Have a Cast:  Do not stick anything inside the cast to scratch your skin. Doing that increases your risk of infection.  Check the skin around the cast every day. Report any concerns to your health care provider. You may put lotion on dry skin around the edges of the cast. Do not apply lotion to the skin underneath the cast.  Keep the cast clean and dry. If You Have a Splint  or a Supportive Boot:  Wear it as directed by your health care provider. Remove it only as directed by your health care provider.  Loosen it if your toes become numb and tingle, or if they turn cold and blue.  Keep it clean and dry. Bathing  Do not take baths, swim, or use a hot tub until your health care provider approves. Ask your health care provider if you can take showers. You may only be allowed to take sponge baths for bathing.  If your health care provider approves bathing and showering, cover the cast or splint with a watertight plastic bag to protect it from water. Do not let the cast or splint get wet. Managing Pain, Stiffness, and Swelling  If directed, apply ice to the injured area (if you have a splint, not a cast).  Put ice in a plastic bag.  Place a towel between your skin and the bag.  Leave the ice on for 20 minutes, 2-3 times per day.  Move your toes often to avoid stiffness and to lessen swelling.  Raise (elevate) the injured area above the level of your heart while you are sitting or lying down. Driving  Do not drive or operate heavy machinery while taking pain medicine.  Do not drive while wearing foot support on a foot that you use for driving. Activity  Return to your normal activities as directed by your health care  provider. Ask your health care provider what activities are safe for you.  Perform exercises as directed by your health care provider or physical therapist. Safety  Do not use the injured foot to support your body weight until your health care provider says that you can. Use crutches as directed by your health care provider. General Instructions  Do not put pressure on any part of the cast or splint until it is fully hardened. This may take several hours.  Do not use any tobacco products, including cigarettes, chewing tobacco, or e-cigarettes. Tobacco can delay bone healing. If you need help quitting, ask your health care  provider.  Take medicines only as directed by your health care provider.  Keep all follow-up visits as directed by your health care provider. This is important. SEEK MEDICAL CARE IF:  You have a fever.  Your cast, splint, or boot is too loose or too tight.  Your cast, splint, or boot is damaged.  Your pain medicine is not helping.  You have pain, tingling, or numbness in your foot that is not going away. SEEK IMMEDIATE MEDICAL CARE IF:  You have severe pain.  You have tingling or numbness in your foot that is getting worse.  Your foot feels cold or becomes numb.  Your foot changes color.   This information is not intended to replace advice given to you by your health care provider. Make sure you discuss any questions you have with your health care provider.   Document Released: 06/24/2002 Document Revised: 02/16/2015 Document Reviewed: 07/29/2014 Elsevier Interactive Patient Education [employer].

## 2015-12-03 ENCOUNTER — Emergency Department (HOSPITAL_COMMUNITY)
Admission: EM | Admit: 2015-12-03 | Discharge: 2015-12-03 | Disposition: A | Discharge status: Home / Self Care | Payer: Medicare Other | Attending: Emergency Medicine | Admitting: Emergency Medicine

## 2015-12-03 ENCOUNTER — Encounter (HOSPITAL_COMMUNITY): Payer: Self-pay | Admitting: Emergency Medicine

## 2015-12-03 DIAGNOSIS — Z8719 Personal history of other diseases of the digestive system: Secondary | ICD-10-CM | POA: Insufficient documentation

## 2015-12-03 DIAGNOSIS — F209 Schizophrenia, unspecified: Secondary | ICD-10-CM | POA: Insufficient documentation

## 2015-12-03 DIAGNOSIS — Z09 Encounter for follow-up examination after completed treatment for conditions other than malignant neoplasm: Secondary | ICD-10-CM | POA: Diagnosis present

## 2015-12-03 DIAGNOSIS — S92351D Displaced fracture of fifth metatarsal bone, right foot, subsequent encounter for fracture with routine healing: Secondary | ICD-10-CM | POA: Diagnosis not present

## 2015-12-03 DIAGNOSIS — Z862 Personal history of diseases of the blood and blood-forming organs and certain disorders involving the immune mechanism: Secondary | ICD-10-CM | POA: Insufficient documentation

## 2015-12-03 DIAGNOSIS — Z79899 Other long term (current) drug therapy: Secondary | ICD-10-CM | POA: Insufficient documentation

## 2015-12-03 DIAGNOSIS — Z8579 Personal history of other malignant neoplasms of lymphoid, hematopoietic and related tissues: Secondary | ICD-10-CM | POA: Insufficient documentation

## 2015-12-03 DIAGNOSIS — S92301A Fracture of unspecified metatarsal bone(s), right foot, initial encounter for closed fracture: Secondary | ICD-10-CM

## 2015-12-03 DIAGNOSIS — F17210 Nicotine dependence, cigarettes, uncomplicated: Secondary | ICD-10-CM | POA: Diagnosis not present

## 2015-12-03 MED ORDER — ACETAMINOPHEN 500 MG PO TABS: 1000.0000 mg | ORAL_TABLET | Freq: Four times a day (QID) | ORAL | Status: DC | PRN
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
## 2015-12-03 NOTE — Discharge Instructions (Signed)
Schedule a follow-up plan with orthopedics. I have provided the name, phone number and address for orthopedic doctor above. Call community health and wellness or another PCP from the resource guide to establish primary care. Continue resting, icing and elevating her foot. Take Tylenol or Motrin for pain.   Metatarsal Fracture A metatarsal fracture is a break in a metatarsal bone. Metatarsal bones connect your toe bones to your ankle bones. CAUSES This type of fracture may be caused by:  A sudden twisting of your foot.  A fall onto your foot.  Overuse or repetitive exercise. RISK FACTORS This condition is more likely to develop in people who:  Play contact sports.  Have a bone disease.  Have a low calcium level. SYMPTOMS Symptoms of this condition include:  Pain that is worse when walking or standing.  Pain when pressing on the foot or moving the toes.  Swelling.  Bruising on the top or bottom of the foot.  A foot that appears shorter than the other one. DIAGNOSIS This condition is diagnosed with a physical exam. You may also have imaging tests, such as:  X-rays.  A CT scan.  MRI. TREATMENT Treatment for this condition depends on its severity and whether a bone has moved out of place. Treatment may involve:  Rest.  Wearing foot support such as a cast, splint, or boot for several weeks.  Using crutches.  Surgery to move bones back into the right position. Surgery is usually needed if there are many pieces of broken bone or bones that are very out of place (displaced fracture).  Physical therapy. This may be needed to help you regain full movement and strength in your foot. You will need to return to your health care provider to have X-rays taken until your bones heal. Your health care provider will look at the X-rays to make sure that your foot is healing well. HOME CARE INSTRUCTIONS  If You Have a Cast:  Do not stick anything inside the cast to scratch your  skin. Doing that increases your risk of infection.  Check the skin around the cast every day. Report any concerns to your health care provider. You may put lotion on dry skin around the edges of the cast. Do not apply lotion to the skin underneath the cast.  Keep the cast clean and dry. If You Have a Splint or a Supportive Boot:  Wear it as directed by your health care provider. Remove it only as directed by your health care provider.  Loosen it if your toes become numb and tingle, or if they turn cold and blue.  Keep it clean and dry. Bathing  Do not take baths, swim, or use a hot tub until your health care provider approves. Ask your health care provider if you can take showers. You may only be allowed to take sponge baths for bathing.  If your health care provider approves bathing and showering, cover the cast or splint with a watertight plastic bag to protect it from water. Do not let the cast or splint get wet. Managing Pain, Stiffness, and Swelling  If directed, apply ice to the injured area (if you have a splint, not a cast).  Put ice in a plastic bag.  Place a towel between your skin and the bag.  Leave the ice on for 20 minutes, 2-3 times per day.  Move your toes often to avoid stiffness and to lessen swelling.  Raise (elevate) the injured area above the level of your  heart while you are sitting or lying down. Driving  Do not drive or operate heavy machinery while taking pain medicine.  Do not drive while wearing foot support on a foot that you use for driving. Activity  Return to your normal activities as directed by your health care provider. Ask your health care provider what activities are safe for you.  Perform exercises as directed by your health care provider or physical therapist. Safety  Do not use the injured foot to support your body weight until your health care provider says that you can. Use crutches as directed by your health care provider. General  Instructions  Do not put pressure on any part of the cast or splint until it is fully hardened. This may take several hours.  Do not use any tobacco products, including cigarettes, chewing tobacco, or e-cigarettes. Tobacco can delay bone healing. If you need help quitting, ask your health care provider.  Take medicines only as directed by your health care provider.  Keep all follow-up visits as directed by your health care provider. This is important. SEEK MEDICAL CARE IF:  You have a fever.  Your cast, splint, or boot is too loose or too tight.  Your cast, splint, or boot is damaged.  Your pain medicine is not helping.  You have pain, tingling, or numbness in your foot that is not going away. SEEK IMMEDIATE MEDICAL CARE IF:  You have severe pain.  You have tingling or numbness in your foot that is getting worse.  Your foot feels cold or becomes numb.  Your foot changes color.   This information is not intended to replace advice given to you by your health care provider. Make sure you discuss any questions you have with your health care provider.   Document Released: 06/24/2002 Document Revised: 02/16/2015 Document Reviewed: 07/29/2014 Elsevier Interactive Patient Education 2016 [employer].   Emergency Department Resource Guide 1) Find a Doctor and Pay Out of Pocket Although you won't have to find out who is covered by your insurance plan, it is a good idea to ask around and get recommendations. You will then need to call the office and see if the doctor you have chosen will accept you as a new patient and what types of options they offer for patients who are self-pay. Some doctors offer discounts or will set up payment plans for their patients who do not have insurance, but you will need to ask so you aren't surprised when you get to your appointment.  2) Contact Your Local Health Department Not all health departments have doctors that can see patients for sick visits,  but many do, so it is worth a call to see if yours does. If you don't know where your local health department is, you can check in your phone book. The CDC also has a tool to help you locate your state's health department, and many state websites also have listings of all of their local health departments.  3) Find a [facility] If your illness is not likely to be very severe or complicated, you may want to try a walk in clinic. These are popping up all over the country in pharmacies, drugstores, and shopping centers. They're usually staffed by nurse practitioners or physician assistants that have been trained to treat common illnesses and complaints. They're usually fairly quick and inexpensive. However, if you have serious medical issues or chronic medical problems, these are probably not your best option.  No Primary Care Doctor: -  Call Health Connect at  [phone] - they can help you locate a primary care doctor that  accepts your insurance, provides certain services, etc. - Physician Referral Service- [phone]  Chronic Pain Problems: Organization         Address  Phone   Notes  [facility]  [phone] Patients need to be referred by their primary care doctor.   Medication Assistance: Organization         Address  Phone   Notes  [facility] Medication [facility] [address] [place], [place] [address] [phone] --Must be a resident of [facility] -- Must have NO insurance coverage whatsoever (no Medicaid/ Medicare, etc.) -- The pt. MUST have a primary care doctor that directs their care regularly and follows them in the community   MedAssist  [phone]   [employer]  [phone]    Agencies that provide inexpensive medical care: Organization         Address  Phone   Notes  [place]  [phone]   [facility]    [phone]   [facility] [place], [place] [address] [phone]   [place] [address], [place] [phone]   Planned Parenthood    [phone]   [facility]    [phone]   [place] and [place] [address], [place] Phone:  [phone], Fax:  [phone] Hours of Operation:  9 am - 6 pm, M-F.  Also accepts Medicaid/Medicare and self-pay.  [facility] for [place], [address], [place] Phone: [phone], Fax: [phone]. Hours of Operation:  8:30 am - 5:30 pm, M-F.  Also accepts Medicaid and self-pay.  [facility] [address] [place] Phone: [phone]   [place], [place], [place] [phone], Ext. [phone] Mondays & Thursdays: 7-9 AM.  First 15 patients are seen on a first come, first serve basis.    [place] Providers:  Organization         Address  Phone   Notes  [facility] [address], [place] [phone] Also accepts self-pay patients.  [facility] [address], [place]  [phone]   [place], [address], [place] [phone]   [facility] [address] [place] [phone]   Lucianne Lei [address], [place]   [phone] Only accepts [place] Access [place] patients after they have their name applied to their card.   Self-Pay (no insurance) in [facility]:  Organization         Address  Phone   Notes  Sickle Cell Patients, [facility] [place] [phone]   [facility] Care [place] [phone]   [facility] [place]  [place], [place], [place] [phone]   [facility]/Dr. Osei-Bonsu  [address],  [place] or [address], [place] [phone] Phone number for both [place] and [place] locations is the same.  Urgent Medical and [facility] [address], [place] [phone]   [place]  [address] [place] or [address] [phone]   [facility] [place] [phone], phone; [phone], fax Sees patients 1st and 3rd Saturday of every month.  Must not qualify for public or private insurance (i.e. Medicaid, Medicare, [place] Health Choice, Veterans' Benefits)  Household income should be no more than 200% of the poverty level The clinic cannot treat you if you are pregnant or think you are pregnant  Sexually transmitted diseases are not treated at the clinic.    Dental Care: Organization         Address  Phone  Notes  [facility] Department of [facility] [place] [phone] Accepts children up to age 55 who are enrolled in [place] or [place]; pregnant women with a Medicaid card; and children who have applied for Medicaid or [place] Health Choice, but were declined, whose parents can pay a reduced fee at time of service.  [facility] Department of [facility]  [address], [place] [phone] Accepts children up to age 65 who are enrolled in [place] or [place]; pregnant women with a Medicaid card; and children who have applied for Medicaid or [place] Health Choice, but were declined, whose parents can pay a reduced fee at time of service.  [place] Adult Dental Access PROGRAM  [place] [phone] Patients are seen by appointment only. Walk-ins are not accepted. [place] will see patients 27 years of age and older. Monday - Tuesday (8am-5pm) Most Wednesdays (8:30-5pm) $30 per visit, cash only  [facility] Adult Dental Access PROGRAM  [address]  Dr, [facility] [phone] Patients are seen by appointment only. Walk-ins are not accepted. [place] will see patients 79 years of age and older. One Wednesday Evening (Monthly: Volunteer Based).  $30 per visit, cash only  [place]  [phone] for adults; Children under age 61, call Graduate Pediatric Dentistry at [phone]. Children aged 30-14, please call [phone] to request a pediatric application.  Dental services are provided in all areas of dental care including fillings, crowns and bridges, complete and partial dentures, implants, gum treatment, root canals, and extractions. Preventive care is also provided. Treatment is provided to both adults and children. Patients are selected via a lottery and there is often a waiting list.   [facility] [address] [place]  [phone] [email or web address]   [facility] [address] [place] [place], [place] [phone], Ext. [phone] Second and Fourth Thursday of each month, opens at 6:30 AM; Clinic ends at 9 AM.  Patients are seen on a first-come first-served basis, and a limited number are seen during each clinic.   [facility]  [address] [place]anker [place], [place] [phone]   Eligibility Requirements You must have lived in [place], [place], or [place] counties for at least the last three months.   You cannot be eligible for state or federal sponsored [employer], including [employer], [place], or [employer].   You generally cannot be eligible for healthcare insurance through your employer.    How to apply: Eligibility screenings are held every Tuesday and Wednesday afternoon from 1:00 pm until 4:00 pm. You do not need an appointment for the interview!  [facility] [address] [place], [place]   [place]  [place] Department  [place]  [phone]    Behavioral Health Resources in the Community: Intensive Outpatient Programs Organization         Address  Phone  Notes  [name]. [address] [place], [place] [phone]   [facility] [address] [place], [place]   ADS: Alcohol & Drug Svcs [address], [place], [place]   [place] [address],  [place], [place] or [phone]   Substance Abuse Resources Organization         Address  Phone  Notes  Alcohol and Drug Services  [phone]   [place]  [phone]   [facility] [place]   Chinita Pester  [phone]   Residential & Outpatient Substance Abuse Program  [phone]   Psychological Services Organization         Address  Phone  Notes  [facility] [place] [facility] [place]  [place]  [phone]   [place] [address], [place] or [phone]    Mobile Crisis Teams Organization         Address  Phone  Notes  [facility], Mobile Crisis Care Unit  [phone]   [facility] Psychotherapeutic Services  [address] [place], [place]   [place] [address] [place] [phone]    Self-Help/Support Groups Organization         Address  Phone             Notes  [place]. of [place] - variety of support groups  [place] Call for more information  Narcotics Anonymous (NA), [facility] [address], [employer] [place]  2 meetings at this location   Special educational needs teacher         Address  Phone  Notes  [facility] Residential Treatment [place],    [place]  [phone]   [facility]  [address] [place] [address], [place], [place]   [place], [place]  [phone] Admissions: 8am-3pm M-F  Incentives Substance [place] [address],    [place], [place] [phone]   [facility] [address] [place], [place], [place]   [facility] [address]  [place], [place]   [facility]ight Programs - Intensive Outpatient [address] Kristeen [address], [place], [place]   [facility] ([place].,  [place], [place] [phone] or [phone]   [facility] Services (RTS) [address], [place], [place] Accepts Medicaid  [place] [address]  [place] [phone] Substance Abuse/Addiction Treatment   [facility] Organization         Address  Phone  Notes  [facility]  [phone]   Domenic Schwab, PhD [address] Arlis [place], [place]   [phone] or [phone]   [place], [place] [phone]   [place] [address], [place], [place] [phone] Insurance/Medicaid/sponsorship through [employer] and Families [address], [address]  [place] [employer], [place] [phone] [place], [place] [phone]    Dr. Adele Schilder  [phone]   [facility] Dept. 1) [address], [place] 2) [place] 3)  [place] [address], [place] [phone]  [phone]   [place] [phone] or [phone] (After Hours)

## 2015-12-08 DIAGNOSIS — S92351A Displaced fracture of fifth metatarsal bone, right foot, initial encounter for closed fracture: Secondary | ICD-10-CM | POA: Diagnosis not present

## 2016-01-14 ENCOUNTER — Emergency Department (HOSPITAL_COMMUNITY)
Admission: EM | Admit: 2016-01-14 | Discharge: 2016-01-16 | Payer: Medicare Other | Attending: Emergency Medicine | Admitting: Emergency Medicine

## 2016-01-14 ENCOUNTER — Encounter (HOSPITAL_COMMUNITY): Payer: Self-pay | Admitting: Emergency Medicine

## 2016-01-14 DIAGNOSIS — Z8719 Personal history of other diseases of the digestive system: Secondary | ICD-10-CM | POA: Insufficient documentation

## 2016-01-14 DIAGNOSIS — F1410 Cocaine abuse, uncomplicated: Secondary | ICD-10-CM | POA: Diagnosis not present

## 2016-01-14 DIAGNOSIS — Z862 Personal history of diseases of the blood and blood-forming organs and certain disorders involving the immune mechanism: Secondary | ICD-10-CM | POA: Diagnosis not present

## 2016-01-14 DIAGNOSIS — F209 Schizophrenia, unspecified: Secondary | ICD-10-CM | POA: Diagnosis not present

## 2016-01-14 DIAGNOSIS — Z8579 Personal history of other malignant neoplasms of lymphoid, hematopoietic and related tissues: Secondary | ICD-10-CM | POA: Insufficient documentation

## 2016-01-14 DIAGNOSIS — Z79899 Other long term (current) drug therapy: Secondary | ICD-10-CM | POA: Diagnosis not present

## 2016-01-14 DIAGNOSIS — R45851 Suicidal ideations: Secondary | ICD-10-CM

## 2016-01-14 DIAGNOSIS — F200 Paranoid schizophrenia: Secondary | ICD-10-CM | POA: Diagnosis present

## 2016-01-14 DIAGNOSIS — Z046 Encounter for general psychiatric examination, requested by authority: Secondary | ICD-10-CM | POA: Diagnosis present

## 2016-01-14 DIAGNOSIS — Z9119 Patient's noncompliance with other medical treatment and regimen: Secondary | ICD-10-CM | POA: Diagnosis not present

## 2016-01-14 DIAGNOSIS — F17210 Nicotine dependence, cigarettes, uncomplicated: Secondary | ICD-10-CM | POA: Insufficient documentation

## 2016-01-14 LAB — COMPREHENSIVE METABOLIC PANEL
ALBUMIN: 4.5 g/dL (ref 3.5–5.0)
ALT: 20 U/L (ref 17–63)
AST: 26 U/L (ref 15–41)
Alkaline Phosphatase: 107 U/L (ref 38–126)
Anion gap: 11 (ref 5–15)
BILIRUBIN TOTAL: 1.3 mg/dL — AB (ref 0.3–1.2)
BUN: 15 mg/dL (ref 6–20)
CO2: 22 mmol/L (ref 22–32)
Calcium: 9.2 mg/dL (ref 8.9–10.3)
Chloride: 104 mmol/L (ref 101–111)
Creatinine, Ser: 0.96 mg/dL (ref 0.61–1.24)
GFR calc Af Amer: >60 mL/min (ref >60)
GFR calc non Af Amer: >60 mL/min (ref >60)
GLUCOSE: 82 mg/dL (ref 65–99)
POTASSIUM: 3.8 mmol/L (ref 3.5–5.1)
Sodium: 137 mmol/L (ref 135–145)
TOTAL PROTEIN: 8.1 g/dL (ref 6.5–8.1)

## 2016-01-14 LAB — RAPID URINE DRUG SCREEN, HOSP PERFORMED
Amphetamines: NOT DETECTED
BARBITURATES: NOT DETECTED
Benzodiazepines: NOT DETECTED
Cocaine: POSITIVE — AB
Opiates: NOT DETECTED
Tetrahydrocannabinol: NOT DETECTED

## 2016-01-14 LAB — CBC WITH DIFFERENTIAL/PLATELET
BASOS PCT: 0 %
Basophils Absolute: 0 10*3/uL (ref 0.0–0.1)
Eosinophils Absolute: 0.4 10*3/uL (ref 0.0–0.7)
Eosinophils Relative: 5 %
HEMATOCRIT: 48.3 % (ref 39.0–52.0)
Hemoglobin: 16.2 g/dL (ref 13.0–17.0)
LYMPHS PCT: 20 %
Lymphs Abs: 1.8 10*3/uL (ref 0.7–4.0)
MCH: 28.8 pg (ref 26.0–34.0)
MCHC: 33.5 g/dL (ref 30.0–36.0)
MCV: 85.9 fL (ref 78.0–100.0)
MONO ABS: 0.7 10*3/uL (ref 0.1–1.0)
MONOS PCT: 8 %
NEUTROS ABS: 6.1 10*3/uL (ref 1.7–7.7)
Neutrophils Relative %: 67 %
Platelets: 308 10*3/uL (ref 150–400)
RBC: 5.62 MIL/uL (ref 4.22–5.81)
RDW: 15.5 % (ref 11.5–15.5)
WBC: 9 10*3/uL (ref 4.0–10.5)

## 2016-01-14 LAB — ETHANOL: Alcohol, Ethyl (B): <5 mg/dL (ref <5)

## 2016-01-14 MED ORDER — ACETAMINOPHEN 500 MG PO TABS: 1000.0000 mg | ORAL_TABLET | Freq: Four times a day (QID) | ORAL | Status: DC | PRN

## 2016-01-14 MED ORDER — OLANZAPINE 10 MG PO TABS
20.0000 mg | ORAL_TABLET | Freq: Every day | ORAL | Status: DC
  Administered 2016-01-14 – 2016-01-15 (×2): 20 mg via ORAL
  Filled 2016-01-14 (×2): qty 2
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
## 2016-01-14 NOTE — ED Notes (Signed)
Patient refused bloodwork
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
## 2016-01-14 NOTE — ED Notes (Signed)
Per paperwork, states he is paranoid, talking to self, standing in traffic-[facility] took out papers

## 2016-01-15 DIAGNOSIS — F209 Schizophrenia, unspecified: Secondary | ICD-10-CM | POA: Diagnosis not present

## 2016-01-15 DIAGNOSIS — R45851 Suicidal ideations: Secondary | ICD-10-CM | POA: Insufficient documentation

## 2016-01-15 MED ORDER — TRAZODONE HCL 50 MG PO TABS: 50.0000 mg | ORAL_TABLET | Freq: Every day | ORAL | Status: DC

## 2016-01-15 MED ORDER — HYDROXYZINE HCL 25 MG PO TABS: 50.0000 mg | ORAL_TABLET | Freq: Three times a day (TID) | ORAL | Status: DC | PRN
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
## 2016-01-15 NOTE — Progress Notes (Signed)
Disposition CSW completed patient referrals to the following inpatient psych facilities:  [facility]  CSW will continue to follow for placement needs.  [facility] Disposition CSW [phone]

## 2016-01-15 NOTE — ED Notes (Addendum)
Pt reports he is at the hospital because of "lies", presents with paranoia. Pt denies SI/HI. Pt behavior guarded, but cooperative throughout the shift. Noted in the dayroom watching television throughout the shift. Will continue to monitor. Special checks q 15 mins in place for safety.

## 2016-01-15 NOTE — Consult Note (Signed)
[facility] [facility] Consult   Reason for Consult:  Medication noncompliant, Aggressive behavior, Insomnia Danger to self and others Referring Physician:  EDP Patient Identification: [name] MRN:  [id number] Principal Diagnosis: Schizophrenia [facility]) Diagnosis:   Patient Active Problem List   Diagnosis Date Noted  . Schizophrenia ([place]) [F20.9]     Priority: High  . Reflux esophagitis [K21.0] 05/24/2013  . Other specified gastritis without mention of hemorrhage [K29.60] 05/24/2013  . Nausea with vomiting [R11.2] 05/23/2013  . Anemia, unspecified [D64.9] 05/23/2013  . Nonspecific abnormal finding in stool contents [R19.5] 05/23/2013  . Ulcerative colitis ([place]) [K51.90]   . SUBSTANCE ABUSE, MULTIPLE [F19.10] 12/31/2007    Total Time spent with patient: 45 minutes  Subjective:   [name] is a 35 y.o. male patient admitted with Medication noncompliant, Aggressive behavior, Insomnia Danger to self and other.  HPI:  AA male, 35 years old was evaluated today brought in under IVC for aggression, agitation, medication noncompliance and not taking care of his ADLS.  Patient denied every accusation written down in the IVC paper.  Patient admits to a diagnosis of Mental illness and receives treatment at [facility].    He states that he only missed a day and half of his medications.  He states that his mother brought him to the hospital because she is about to get his disability check.  Patient was hospitalized at [facility] in 2013.  Patient reports that they do not have electricity in the house.  Patient looks disheveled but fully engaged in the interview.  He reports fair sleep and good appetite.  He denies SI/HI/AVH.  He has been accepted for admission and we will be seeking placement at any facility with available bed.  Past Psychiatric History: Schizophrenia  Risk to Self: Suicidal Ideation: No Suicidal Intent: No Is patient at risk for suicide?: No Suicidal Plan?: No Access to Means:  No What has been your use of drugs/alcohol within the last 12 months?: Denies How many times?: 0 Other Self Harm Risks: Denies Triggers for Past Attempts: None known Intentional Self Injurious Behavior: None Risk to Others: Homicidal Ideation: No Thoughts of Harm to Others: No Current Homicidal Intent: No Current Homicidal Plan: No Access to Homicidal Means: No Identified Victim: Denies History of harm to others?: No Assessment of Violence: None Noted Violent Behavior Description: Denies Does patient have access to weapons?: No Criminal Charges Pending?: Yes Describe Pending Criminal Charges: possession of cocaine and THC Does patient have a court date: Yes Court Date: 01/25/16 Prior Inpatient Therapy: Prior Inpatient Therapy: Yes Prior Therapy Dates: 2010 Prior Therapy Facilty/Provider(s): [facility]  Reason for Treatment: Depression  Prior Outpatient Therapy: Prior Outpatient Therapy: No Prior Therapy Dates: N/A Prior Therapy Facilty/Provider(s): N/A Reason for Treatment: N/A Does patient have an ACCT team?: No Does patient have Intensive In-House Services?  : No Does patient have [facility] services? : No Does patient have P4CC services?: No  Past Medical History:  Past Medical History  Diagnosis Date  . Ulcerative colitis   . Schizophrenia ([place])   . GI (gastrointestinal bleed) 04/2013  . Crohn's disease ([place])   . Suicide attempt by drug ingestion ([place])   . Sickle cell anemia (H[facility])   . Multiple myeloma [facility])     Past Surgical History  Procedure Laterality Date  . Mandible fracture surgery    . Flexible sigmoidoscopy  12/05/2011    Procedure: FLEXIBLE SIGMOIDOSCOPY;  Surgeon: Beryle Beams, MD;  Location: [facility] ENDOSCOPY;  Service: Endoscopy;  Laterality: N/A;  .  Esophagogastroduodenoscopy N/A 05/24/2013    Procedure: ESOPHAGOGASTRODUODENOSCOPY (EGD);  Surgeon: Ladene Artist, MD;  Location: [facility] ENDOSCOPY;  Service: Endoscopy;  Laterality: N/A;   Family History:  Family History   Problem Relation Age of Onset  . Liver cancer     Family Psychiatric  History:  denies Social History:  History  Alcohol Use  . Yes    Comment: quit     History  Drug Use  . Yes  . Special: Cocaine, Marijuana    Comment: quit 1 mo ago    Social History   Social History  . Marital Status: Single    Spouse Name: N/A  . Number of Children: N/A  . Years of Education: GED   Occupational History  . disabled    Social History Main Topics  . Smoking status: Current Every Day Smoker -- 0.25 packs/day for 20 years    Types: Cigarettes  . Smokeless tobacco: Never Used  . Alcohol Use: Yes     Comment: quit  . Drug Use: Yes    Special: Cocaine, Marijuana     Comment: quit 1 mo ago  . Sexual Activity: Not Asked   Other Topics Concern  . None   Social History Narrative   [name] was recently released from prison, currently living at home with his mother.  He is disabled (due to mental illness?).    Additional Social History:    Allergies:   Allergies  Allergen Reactions  . Depakote [Divalproex Sodium] Other (See Comments)    Reaction: seizures   . Lithium Other (See Comments)    Reaction: rectal bleeding  . Nsaids Other (See Comments)    Reaction: rectal bleeding  . Risperidone And Related     Chest pains    Labs:  Results for orders placed or performed during the hospital encounter of 01/14/16 (from the past 48 hour(s))  Comprehensive metabolic panel     Status: Abnormal   Collection Time: 01/14/16 11:51 AM  Result Value Ref Range   Sodium 137 135 - 145 mmol/L   Potassium 3.8 3.5 - 5.1 mmol/L   Chloride 104 101 - 111 mmol/L   CO2 22 22 - 32 mmol/L   Glucose, Bld 82 65 - 99 mg/dL   BUN 15 6 - 20 mg/dL   Creatinine, Ser 0.96 0.61 - 1.24 mg/dL   Calcium 9.2 8.9 - 10.3 mg/dL   Total Protein 8.1 6.5 - 8.1 g/dL   Albumin 4.5 3.5 - 5.0 g/dL   AST 26 15 - 41 U/L   ALT 20 17 - 63 U/L   Alkaline Phosphatase 107 38 - 126 U/L   Total Bilirubin 1.3 (H) 0.3 -  1.2 mg/dL   GFR calc non Af Amer >60 >60 mL/min   GFR calc Af Amer >60 >60 mL/min    Comment: (NOTE) The eGFR has been calculated using the CKD EPI equation. This calculation has not been validated in all clinical situations. eGFR's persistently <60 mL/min signify possible Chronic Kidney Disease.    Anion gap 11 5 - 15  Ethanol     Status: None   Collection Time: 01/14/16 11:51 AM  Result Value Ref Range   Alcohol, Ethyl (B) <5 <5 mg/dL    Comment:        LOWEST DETECTABLE LIMIT FOR SERUM ALCOHOL IS 5 mg/dL FOR MEDICAL PURPOSES ONLY   CBC with Diff     Status: None   Collection Time: 01/14/16 11:51 AM  Result Value  Ref Range   WBC 9.0 4.0 - 10.5 K/uL   RBC 5.62 4.22 - 5.81 MIL/uL   Hemoglobin 16.2 13.0 - 17.0 g/dL   HCT 48.3 39.0 - 52.0 %   MCV 85.9 78.0 - 100.0 fL   MCH 28.8 26.0 - 34.0 pg   MCHC 33.5 30.0 - 36.0 g/dL   RDW 15.5 11.5 - 15.5 %   Platelets 308 150 - 400 K/uL   Neutrophils Relative % 67 %   Neutro Abs 6.1 1.7 - 7.7 K/uL   Lymphocytes Relative 20 %   Lymphs Abs 1.8 0.7 - 4.0 K/uL   Monocytes Relative 8 %   Monocytes Absolute 0.7 0.1 - 1.0 K/uL   Eosinophils Relative 5 %   Eosinophils Absolute 0.4 0.0 - 0.7 K/uL   Basophils Relative 0 %   Basophils Absolute 0.0 0.0 - 0.1 K/uL  Urine rapid drug screen (hosp performed)not at [facility]     Status: Abnormal   Collection Time: 01/14/16 12:31 PM  Result Value Ref Range   Opiates NONE DETECTED NONE DETECTED   Cocaine POSITIVE (A) NONE DETECTED   Benzodiazepines NONE DETECTED NONE DETECTED   Amphetamines NONE DETECTED NONE DETECTED   Tetrahydrocannabinol NONE DETECTED NONE DETECTED   Barbiturates NONE DETECTED NONE DETECTED    Comment:        DRUG SCREEN FOR MEDICAL PURPOSES ONLY.  IF CONFIRMATION IS NEEDED FOR ANY PURPOSE, NOTIFY LAB WITHIN 5 DAYS.        LOWEST DETECTABLE LIMITS FOR URINE DRUG SCREEN Drug Class       Cutoff (ng/mL) Amphetamine      1000 Barbiturate      200 Benzodiazepine    144 Tricyclics       818 Opiates          300 Cocaine          300 THC              50     Current Facility-Administered Medications  Medication Dose Route Frequency Provider Last Rate Last Dose  . acetaminophen (TYLENOL) tablet 1,000 mg  1,000 mg Oral Q6H PRN Deno Etienne, DO      . OLANZapine (ZYPREXA) tablet 20 mg  20 mg Oral QHS Deno Etienne, DO   20 mg at 01/14/16 2106   Current Outpatient Prescriptions  Medication Sig Dispense Refill  . OLANZapine (ZYPREXA) 20 MG tablet Take 20 mg by mouth at bedtime.    Marland Kitchen acetaminophen (TYLENOL) 500 MG tablet Take 2 tablets (1,000 mg total) by mouth every 6 (six) hours as needed. Take 1000 mg tylenol every 6 hours. Do not take more than 4000 mg (8 tablets) in a single day. (Patient not taking: Reported on 01/14/2016) 30 tablet 0  . ciprofloxacin (CIPRO) 500 MG tablet Take 1 tablet (500 mg total) by mouth every 12 (twelve) hours. (Patient not taking: Reported on 11/20/2015) 10 tablet 0  . methocarbamol (ROBAXIN) 500 MG tablet Take 1 tablet (500 mg total) by mouth 2 (two) times daily. (Patient not taking: Reported on 01/14/2016) 20 tablet 0  . metroNIDAZOLE (FLAGYL) 500 MG tablet Take 1 tablet (500 mg total) by mouth 2 (two) times daily. (Patient not taking: Reported on 11/20/2015) 14 tablet 0  . predniSONE (DELTASONE) 10 MG tablet Take 2 tablets (20 mg total) by mouth daily. (Patient not taking: Reported on 11/20/2015) 10 tablet 0  . traMADol (ULTRAM) 50 MG tablet Take 1 tablet (50 mg total) by mouth every 6 (six) hours  as needed. (Patient not taking: Reported on 01/14/2016) 15 tablet 0   Facility-Administered Medications Ordered in Other Encounters  Medication Dose Route Frequency Provider Last Rate Last Dose  . hydrOXYzine (ATARAX/VISTARIL) tablet 50 mg  50 mg Oral TID PRN Encarnacion Slates, NP      . magnesium hydroxide (MILK OF MAGNESIA) suspension 30 mL  30 mL Oral Daily PRN Encarnacion Slates, NP        Musculoskeletal: Strength & Muscle Tone: within normal  limits Gait & Station: normal Patient leans: N/A  Psychiatric Specialty Exam: Review of Systems  Constitutional: Negative.   HENT: Negative.   Eyes: Negative.   Respiratory: Negative.   Cardiovascular: Negative.   Gastrointestinal: Negative.        Hx of GERD  Genitourinary: Negative.   Musculoskeletal: Negative.   Skin: Negative.   Neurological: Negative.   Endo/Heme/Allergies: Negative.     Blood pressure 150/100, pulse 88, temperature 98.6 F (37 C), temperature source Oral, resp. rate 18, SpO2 96 %.There is no weight on file to calculate BMI.  General Appearance: Casual and Disheveled  Eye Contact::  Good  Speech:  Clear and Coherent and Normal Rate  Volume:  Normal  Mood:  Euthymic  Affect:  Congruent  Thought Process:  Coherent, Goal Directed and Intact  Orientation:  Full (Time, Place, and Person)  Thought Content:  WDL  Suicidal Thoughts:  No  Homicidal Thoughts:  No  Memory:  Immediate;   Good Recent;   Good Remote;   Good  Judgement:  Fair  Insight:  Fair  Psychomotor Activity:  Normal  Concentration:  Good  Recall:  [place] of Knowledge:Fair  Language: Good  Akathisia:  No  Handed:  Right  AIMS (if indicated):     Assets:  Desire for Improvement  ADL's:  Impaired  Cognition: WNL  Sleep:      Treatment Plan Summary: Daily contact with patient to assess and evaluate symptoms and progress in treatment and Medication management  Disposition:  Accepted for admission and we will be seeking placement at any facility with available bed.  We have resumed his home medication.  We will offer Hydroxyzine 50 mg po every 8 hours as needed for anxiety.  Trazodone 50 mg po at bed time for sleep.  Delfin Gant, NP   PMHNP-BC 01/15/2016 5:33 PM   Patient seen and I agree with assessment and plan  Griffin Dakin.D.

## 2016-01-16 NOTE — ED Notes (Signed)
Sheriff at facility to transport pt to [facility] per MD order. Pt signed for personal belongings and personal belongings given to sheriff for transport. Pt ambulatory off unit with sheriff.

## 2016-01-16 NOTE — ED Notes (Addendum)
Pt up at nurses station, irritable, disheveled; showing open breakfast tray, 75% noted eaten. "See, she said I wasn't eating."

## 2016-01-16 NOTE — BH Assessment (Signed)
Received call from [place] at [facility] who said Pt has been accepted to [facility] by Dr. Lynnell Grain. Pt can come after 1000. Number for nursing report is [phone]. Notified Dr. Christy Gentles and Danelle Earthly, RN of acceptance.  Orpah Greek Anson Fret, LPC, [facility], [facility] Triage Specialist [phone]

## 2016-01-17 DIAGNOSIS — F209 Schizophrenia, unspecified: Secondary | ICD-10-CM | POA: Diagnosis not present

## 2016-01-18 DIAGNOSIS — F209 Schizophrenia, unspecified: Secondary | ICD-10-CM | POA: Diagnosis not present

## 2016-01-19 DIAGNOSIS — F209 Schizophrenia, unspecified: Secondary | ICD-10-CM | POA: Diagnosis not present

## 2016-01-20 DIAGNOSIS — F209 Schizophrenia, unspecified: Secondary | ICD-10-CM | POA: Diagnosis not present

## 2016-01-21 DIAGNOSIS — F209 Schizophrenia, unspecified: Secondary | ICD-10-CM | POA: Diagnosis not present

## 2016-01-22 DIAGNOSIS — F209 Schizophrenia, unspecified: Secondary | ICD-10-CM | POA: Diagnosis not present

## 2016-03-02 ENCOUNTER — Encounter (HOSPITAL_COMMUNITY): Payer: Self-pay | Admitting: *Deleted

## 2016-03-02 ENCOUNTER — Emergency Department (HOSPITAL_COMMUNITY)
Admission: EM | Admit: 2016-03-02 | Discharge: 2016-03-03 | Disposition: A | Discharge status: Home / Self Care | Payer: Medicare Other | Attending: Emergency Medicine | Admitting: Emergency Medicine

## 2016-03-02 DIAGNOSIS — F1494 Cocaine use, unspecified with cocaine-induced mood disorder: Secondary | ICD-10-CM

## 2016-03-02 DIAGNOSIS — R45851 Suicidal ideations: Secondary | ICD-10-CM | POA: Diagnosis not present

## 2016-03-02 DIAGNOSIS — Z79899 Other long term (current) drug therapy: Secondary | ICD-10-CM | POA: Insufficient documentation

## 2016-03-02 DIAGNOSIS — F17210 Nicotine dependence, cigarettes, uncomplicated: Secondary | ICD-10-CM | POA: Diagnosis not present

## 2016-03-02 DIAGNOSIS — F1414 Cocaine abuse with cocaine-induced mood disorder: Secondary | ICD-10-CM | POA: Diagnosis present

## 2016-03-02 DIAGNOSIS — F14150 Cocaine abuse with cocaine-induced psychotic disorder with delusions: Secondary | ICD-10-CM | POA: Diagnosis present

## 2016-03-02 DIAGNOSIS — F1410 Cocaine abuse, uncomplicated: Secondary | ICD-10-CM

## 2016-03-02 LAB — CBC
HEMATOCRIT: 48.3 % (ref 39.0–52.0)
HEMOGLOBIN: 16.3 g/dL (ref 13.0–17.0)
MCH: 29.2 pg (ref 26.0–34.0)
MCHC: 33.7 g/dL (ref 30.0–36.0)
MCV: 86.4 fL (ref 78.0–100.0)
Platelets: 346 10*3/uL (ref 150–400)
RBC: 5.59 MIL/uL (ref 4.22–5.81)
RDW: 15.4 % (ref 11.5–15.5)
WBC: 9 10*3/uL (ref 4.0–10.5)

## 2016-03-02 LAB — COMPREHENSIVE METABOLIC PANEL
ALBUMIN: 4.5 g/dL (ref 3.5–5.0)
ALT: 26 U/L (ref 17–63)
ANION GAP: 7 (ref 5–15)
AST: 35 U/L (ref 15–41)
Alkaline Phosphatase: 88 U/L (ref 38–126)
BILIRUBIN TOTAL: 0.6 mg/dL (ref 0.3–1.2)
BUN: 14 mg/dL (ref 6–20)
CHLORIDE: 103 mmol/L (ref 101–111)
CO2: 27 mmol/L (ref 22–32)
Calcium: 9.4 mg/dL (ref 8.9–10.3)
Creatinine, Ser: 0.93 mg/dL (ref 0.61–1.24)
GFR calc Af Amer: >60 mL/min (ref >60)
Glucose, Bld: 117 mg/dL — ABNORMAL HIGH (ref 65–99)
POTASSIUM: 3.7 mmol/L (ref 3.5–5.1)
Sodium: 137 mmol/L (ref 135–145)
TOTAL PROTEIN: 8.1 g/dL (ref 6.5–8.1)

## 2016-03-02 LAB — RAPID URINE DRUG SCREEN, HOSP PERFORMED
Amphetamines: NOT DETECTED
BARBITURATES: NOT DETECTED
BENZODIAZEPINES: NOT DETECTED
COCAINE: POSITIVE — AB
Opiates: NOT DETECTED
TETRAHYDROCANNABINOL: NOT DETECTED

## 2016-03-02 LAB — SALICYLATE LEVEL: Salicylate Lvl: <4 mg/dL (ref 2.8–30.0)

## 2016-03-02 LAB — ETHANOL

## 2016-03-02 LAB — ACETAMINOPHEN LEVEL

## 2016-03-02 MED ORDER — ZOLPIDEM TARTRATE 5 MG PO TABS
5.0000 mg | ORAL_TABLET | Freq: Every evening | ORAL | Status: DC | PRN
  Filled 2016-03-02: qty 1

## 2016-03-02 MED ORDER — ALUM & MAG HYDROXIDE-SIMETH 200-200-20 MG/5ML PO SUSP: 30.0000 mL | ORAL | Status: DC | PRN

## 2016-03-02 MED ORDER — ONDANSETRON HCL 4 MG PO TABS: 4.0000 mg | ORAL_TABLET | Freq: Three times a day (TID) | ORAL | Status: DC | PRN

## 2016-03-02 MED ORDER — OLANZAPINE 10 MG PO TABS
20.0000 mg | ORAL_TABLET | Freq: Every day | ORAL | Status: DC
  Administered 2016-03-02: 20 mg via ORAL

## 2016-03-02 MED ORDER — NICOTINE 21 MG/24HR TD PT24: 21.0000 mg | MEDICATED_PATCH | Freq: Every day | TRANSDERMAL | Status: DC

## 2016-03-02 MED ORDER — ALBUTEROL SULFATE HFA 108 (90 BASE) MCG/ACT IN AERS: 2.0000 | INHALATION_SPRAY | RESPIRATORY_TRACT | Status: DC | PRN

## 2016-03-02 MED ORDER — LORAZEPAM 1 MG PO TABS: 1.0000 mg | ORAL_TABLET | Freq: Three times a day (TID) | ORAL | Status: DC | PRN

## 2016-03-02 MED ORDER — ACETAMINOPHEN 500 MG PO TABS
1000.0000 mg | ORAL_TABLET | Freq: Once | ORAL | Status: AC
  Administered 2016-03-02: 1000 mg via ORAL
  Filled 2016-03-02: qty 2

## 2016-03-02 MED ORDER — OLANZAPINE 10 MG PO TABS
20.0000 mg | ORAL_TABLET | Freq: Every day | ORAL | Status: DC
  Filled 2016-03-02: qty 2

## 2016-03-02 NOTE — ED Provider Notes (Signed)
CSN: [id number]     Arrival date & time 03/02/16  1723 History   First MD Initiated Contact with Patient 03/02/16 1859     Chief Complaint  Patient presents with  . Suicidal     (Consider location/radiation/quality/duration/timing/severity/associated sxs/prior Treatment) HPI  35 year old male presents with suicidal ideation. The patient is an overall poor historian. Patient tells me that he's been feeling this way for a couple days. He plans to overdose on pills. He states over the last 3 days he's been taking over-the-counter medicines. He can't really tell me if this is for pain from Crohn's disease or if it is from wanting to kill himself. He states she's taken Tylenol and Tylenol Sinus. Cannot tell me exact amounts. When asked about alcohol use, he says "I'm old enough to drink! I'll drink if I want to". When asked if he uses cocaine he adamantly denies in gets angry. When told that it is positive in his urine he eventually states that someone must of sprinkled it on his weed. States he's having abdominal pain from an ulcerative colitis flare (which he then changes to a chron's flare) and states he needs dilaudid or morphine. Endorses vomiting and diarrhea.   Past Medical History  Diagnosis Date  . Ulcerative colitis   . Schizophrenia ([place])   . GI (gastrointestinal bleed) 04/2013  . Crohn's disease ([place])   . Suicide attempt by drug ingestion ([place])   . Sickle cell anemia ([facility])   . Multiple myeloma [facility])    Past Surgical History  Procedure Laterality Date  . Mandible fracture surgery    . Flexible sigmoidoscopy  12/05/2011    Procedure: FLEXIBLE SIGMOIDOSCOPY;  Surgeon: Beryle Beams, MD;  Location: [facility] ENDOSCOPY;  Service: Endoscopy;  Laterality: N/A;  . Esophagogastroduodenoscopy N/A 05/24/2013    Procedure: ESOPHAGOGASTRODUODENOSCOPY (EGD);  Surgeon: Ladene Artist, MD;  Location: [facility] ENDOSCOPY;  Service: Endoscopy;  Laterality: N/A;   Family History  Problem Relation Age of Onset   . Liver cancer     Social History  Substance Use Topics  . Smoking status: Current Every Day Smoker -- 0.25 packs/day for 20 years    Types: Cigarettes  . Smokeless tobacco: Never Used  . Alcohol Use: Yes     Comment: quit    Review of Systems  Unable to perform ROS: Psychiatric disorder      Allergies  Depakote; Lithium; Nsaids; and Risperidone and related  Home Medications   Prior to Admission medications   Medication Sig Start Date End Date Taking? Authorizing Provider  OLANZapine (ZYPREXA) 20 MG tablet Take 20 mg by mouth at bedtime.   Yes Historical Provider, MD  pseudoephedrine-acetaminophen (TYLENOL SINUS) 30-500 MG TABS tablet Take 2 tablets by mouth every 4 (four) hours as needed (sinus congestion).   Yes Historical Provider, MD   BP 131/80 mmHg  Pulse 88  Temp(Src) 98.3 F (36.8 C) (Oral)  SpO2 100% Physical Exam  Constitutional: He is oriented to person, place, and time. He appears well-developed and well-nourished.  HENT:  Head: Normocephalic and atraumatic.  Right Ear: External ear normal.  Left Ear: External ear normal.  Nose: Nose normal.  Mouth/Throat: Oropharynx is clear and moist.  Eyes: Right eye exhibits no discharge. Left eye exhibits no discharge.  Neck: Neck supple.  Cardiovascular: Normal rate, regular rhythm, normal heart sounds and intact distal pulses.   Pulmonary/Chest: Effort normal and breath sounds normal.  Abdominal: Soft. There is no tenderness.  Musculoskeletal: He exhibits no  edema.  Neurological: He is alert and oriented to person, place, and time.  Skin: Skin is warm and dry.  Psychiatric: His affect is labile. He expresses suicidal ideation. He expresses suicidal plans.  Labile. Is mostly calm but did get quite angry one time with shouting when cocaine was brought up  Nursing note and vitals reviewed.   ED Course  Procedures (including critical care time) Labs Review Labs Reviewed  COMPREHENSIVE METABOLIC PANEL -  Abnormal; Notable for the following:    Glucose, Bld 117 (*)    All other components within normal limits  ACETAMINOPHEN LEVEL - Abnormal; Notable for the following:    Acetaminophen (Tylenol), Serum <10 (*)    All other components within normal limits  URINE RAPID DRUG SCREEN, HOSP PERFORMED - Abnormal; Notable for the following:    Cocaine POSITIVE (*)    All other components within normal limits  ETHANOL  SALICYLATE LEVEL  CBC    Imaging Review No results found. I have personally reviewed and evaluated these images and lab results as part of my medical decision-making.   EKG Interpretation None      MDM   Final diagnoses:  Suicidal ideation    Patient appears medically stable. He does also appear to be psychotic at this time with labile mood and SI. Psych to be consulted for evaluation and treatment.     Sherwood Gambler, MD 03/03/16 0110

## 2016-03-02 NOTE — ED Notes (Signed)
MADE THREE CALLS FOR THIS PATIENT,PATIENT WAS LOOKED FOR IN LOBBY,RESTROOMS AND PARKING AREA PATIENT DID NOT ANSWER ANY CALLS.

## 2016-03-02 NOTE — BH Assessment (Addendum)
Assessment Note  [name] is an 35 y.o. male presenting voluntarily to [facility] for SI with no plan at this time. Patient reports that he took "seven or thirteen pills for sinus or allergy pills or something like that" around 4pm and took the "city bus" to the hospital.  Patient told his nurse that he has taken pills for the past two days but he denies this to this Probation officer. Patient reports that he took the pills "because I felt like killing myself." Patient denies intent or plan at this time. Patient denies that he has any recent stressors when asked but states that he "just got into an argument" with someone.  Patient denies previous attempts but states that he has thought about killing himself before and was hospitalized in 2014. Patient denies self injurious behaviors. Patient denies HI and history of aggressive behavior. Patient denies pending charges and upcoming court dates. Patient denies access to weapons or firearms. Patient denies active probation.  Patient and AVH and does not ppear to be responding to internal stimuli at time of assessment. Patient has been previously assessed for Schizophrenia and was referred to a different hospital. Patient does not appear to be responding to internal stimuli during the assessment. Patient reports that he smokes THC "one in a blue moon" and las smoked this past Monday. Patient denies use of other drugs or alcohol. Patient UDS + cocaine and ETOH <5 at time of assessment.   Patient is alert and oriented x4. Patient is calm and cooperative and makes fair eye contact during the assessment. Patient is dressed in scrubs and is seated in a chair in triage during the assessment. Patient denies recent stressors and reports that he feels that he is depressed. Patient endorses symptoms of depression as isolating himself from others and denies all other symptoms. Patient states that his appetite is "fair" and he sleeps "12-14" hours per night. Patient reports that he has had  previous inpatient treatment at [facility] in 2014 and per his chart review he has received treatment at other facilities as well. Patient has requested to return to [facility]. Patient denies outpatient treatment but reports that he gets his medications filled at [facility]. Patient reports that he has not taken his medications in "4-5 days." Patient reports that he would like to be restarted on his medications and specifically requested Zyprexa.   Diagnosis: Schizophrenia  Past Medical History:  Past Medical History  Diagnosis Date  . Ulcerative colitis   . Schizophrenia ([place])   . GI (gastrointestinal bleed) 04/2013  . Crohn's disease ([place])   . Suicide attempt by drug ingestion ([place])   . Sickle cell anemia ([facility])   . Multiple myeloma [facility])     Past Surgical History  Procedure Laterality Date  . Mandible fracture surgery    . Flexible sigmoidoscopy  12/05/2011    Procedure: FLEXIBLE SIGMOIDOSCOPY;  Surgeon: Beryle Beams, MD;  Location: [facility] ENDOSCOPY;  Service: Endoscopy;  Laterality: N/A;  . Esophagogastroduodenoscopy N/A 05/24/2013    Procedure: ESOPHAGOGASTRODUODENOSCOPY (EGD);  Surgeon: Ladene Artist, MD;  Location: [facility] ENDOSCOPY;  Service: Endoscopy;  Laterality: N/A;    Family History:  Family History  Problem Relation Age of Onset  . Liver cancer      Social History:  reports that he has been smoking Cigarettes.  He has a 5 pack-year smoking history. He has never used smokeless tobacco. He reports that he drinks alcohol. He reports that he uses illicit drugs (Cocaine and Marijuana).  Additional Social  History:  Alcohol / Drug Use Pain Medications: See PTA Prescriptions: See PTA Over the Counter: See PTA History of alcohol / drug use?: Yes Substance #1 Name of Substance 1: THC 1 - Age of First Use: 9 1 - Amount (size/oz): "a joint" 1 - Frequency: "not often at all" 1 - Duration: ongoing 1 - Last Use / Amount: "one joint" Monday  CIWA: CIWA-Ar BP: 131/80 mmHg Pulse Rate: 88 COWS:     Allergies:  Allergies  Allergen Reactions  . Depakote [Divalproex Sodium] Other (See Comments)    Reaction: seizures   . Lithium Other (See Comments)    Reaction: rectal bleeding  . Nsaids Other (See Comments)    Reaction: rectal bleeding  . Risperidone And Related     Chest pains    Home Medications:  (Not in a hospital admission)  OB/GYN Status:  No LMP for male patient.  General Assessment Data [facility]: [facility] TTS Assessment: In system Is this a Tele or Face-to-Face Assessment?: Face-to-Face Is this an Initial Assessment or a Re-assessment for this encounter?: Initial Assessment Marital status: Single Is patient pregnant?: No Pregnancy Status: No Living Arrangements: Other (Comment) (homeless) Can pt return to current living arrangement?: No Admission Status: Voluntary Is patient capable of signing voluntary admission?: Yes Referral Source: Self/Family/Friend     Crisis Care Plan Living Arrangements: Other (Comment) (homeless) Name of Psychiatrist: None Name of Therapist: None  Education Status Is patient currently in school?: Yes Current Grade: Freshman Name of school: [employer] to self with the past 6 months Suicidal Ideation: Yes-Currently Present Has patient been a risk to self within the past 6 months prior to admission? : No Suicidal Intent: No Has patient had any suicidal intent within the past 6 months prior to admission? : No Is patient at risk for suicide?: No Suicidal Plan?: No Has patient had any suicidal plan within the past 6 months prior to admission? : No Access to Means: No What has been your use of drugs/alcohol within the last 12 months?: THC Previous Attempts/Gestures: No How many times?: 0 Other Self Harm Risks: Denies Triggers for Past Attempts: None known Intentional Self Injurious Behavior: None Family Suicide History: No Recent stressful life event(s):  ("not really") Persecutory voices/beliefs?:  No Depression: Yes Depression Symptoms: Isolating Substance abuse history and/or treatment for substance abuse?: Yes Suicide prevention information given to non-admitted patients: Not applicable  Risk to Others within the past 6 months Homicidal Ideation: No Does patient have any lifetime risk of violence toward others beyond the six months prior to admission? : No Thoughts of Harm to Others: No Current Homicidal Intent: No Current Homicidal Plan: No Access to Homicidal Means: No Identified Victim: Denies History of harm to others?: No Assessment of Violence: None Noted Violent Behavior Description: Denies Does patient have access to weapons?: No Criminal Charges Pending?: No Does patient have a court date: No Is patient on probation?: No  Psychosis Hallucinations: None noted Delusions: None noted  Mental Status Report Appearance/Hygiene: In scrubs Eye Contact: Fair Motor Activity: Unable to assess Speech: Logical/coherent Level of Consciousness: Alert Mood: Pleasant Anxiety Level: None Thought Processes: Coherent, Relevant Judgement: Unimpaired Orientation: Person, Place, Time, Situation, Appropriate for developmental age Obsessive Compulsive Thoughts/Behaviors: None  Cognitive Functioning Concentration: Decreased Memory: Recent Intact, Remote Intact IQ: Average Insight: Fair Impulse Control: Fair Appetite: Fair Sleep: Increased Total Hours of Sleep:  (12-14) Vegetative Symptoms: None  ADLScreening [facility] Assessment Services) Patient's cognitive ability adequate to safely  complete daily activities?: Yes Patient able to express need for assistance with ADLs?: Yes Independently performs ADLs?: Yes (appropriate for developmental age)  Prior Inpatient Therapy Prior Inpatient Therapy: Yes Prior Therapy Dates: 2014, 2017 Prior Therapy Facilty/Provider(s): [facility] [place] Reason for Treatment: SI/Schizophrenia  Prior Outpatient Therapy Prior Outpatient Therapy:  No Prior Therapy Dates: N/A Prior Therapy Facilty/Provider(s): N/A Reason for Treatment: N/A Does patient have an ACCT team?: No Does patient have Intensive In-House Services?  : No Does patient have [facility] services? : Yes (prescriptions) Does patient have P4CC services?: No  ADL Screening (condition at time of admission) Patient's cognitive ability adequate to safely complete daily activities?: Yes Is the patient deaf or have difficulty hearing?: No Does the patient have difficulty seeing, even when wearing glasses/contacts?: No Does the patient have difficulty concentrating, remembering, or making decisions?: No Patient able to express need for assistance with ADLs?: Yes Does the patient have difficulty dressing or bathing?: No Independently performs ADLs?: Yes (appropriate for developmental age) Does the patient have difficulty walking or climbing stairs?: No Weakness of Legs: None Weakness of Arms/Hands: None  Home Assistive Devices/Equipment Home Assistive Devices/Equipment: None    Abuse/Neglect Assessment (Assessment to be complete while patient is alone) Physical Abuse: Denies Verbal Abuse: Denies Sexual Abuse: Denies Self-Neglect: Denies Values / Beliefs Cultural Requests During Hospitalization: None Spiritual Requests During Hospitalization: None Consults Spiritual Care Consult Needed: No Social Work Consult Needed: No Regulatory affairs officer (For Healthcare) Does patient have an advance directive?: No    Additional Information 1:1 In Past 12 Months?: No CIRT Risk: No Elopement Risk: No Does patient have medical clearance?: Yes     Disposition:  Disposition Initial Assessment Completed for this Encounter: Yes Disposition of Patient: Other dispositions (per Serena Colonel, NP) Type of inpatient treatment program: Adult Other disposition(s): Other (Comment) (observe overnight and evaluate in AM )  On Site Evaluation by:   Reviewed with Physician:    Braxson Hollingsworth 03/02/2016 9:19 PM

## 2016-03-02 NOTE — ED Notes (Signed)
Pt reports SI, states "I took over the counter pills and it ain't working."  "I took some today, I took some yesterday."  Pt is calm and cooperative at this time."  Pt reports hx of ulcerative colitis.

## 2016-03-02 NOTE — ED Notes (Signed)
Report received from [place]. Patient alert and oriented, warm and dry, in no acute distress. Patient denies  HI, AVH, anxiety, at this time. Patient made aware of Q15 minute rounds and security cameras for their safety. Patient instructed to come to desk with needs or concerns. Patient contracts for safety.

## 2016-03-02 NOTE — BH Assessment (Signed)
Assessment completed. Consulted with Serena Colonel, NP who recommends patient be observed overnight and evaluated by psychiatry in the morning.   Rosalin Hawking, LCSW Therapeutic Triage Specialist [place] 03/02/2016 7:58 PM

## 2016-03-03 DIAGNOSIS — F1494 Cocaine use, unspecified with cocaine-induced mood disorder: Secondary | ICD-10-CM | POA: Diagnosis not present

## 2016-03-03 DIAGNOSIS — F1410 Cocaine abuse, uncomplicated: Secondary | ICD-10-CM

## 2016-03-03 DIAGNOSIS — F14150 Cocaine abuse with cocaine-induced psychotic disorder with delusions: Secondary | ICD-10-CM | POA: Diagnosis present

## 2016-03-03 DIAGNOSIS — F1414 Cocaine abuse with cocaine-induced mood disorder: Secondary | ICD-10-CM | POA: Diagnosis present

## 2016-03-03 MED ORDER — OLANZAPINE 20 MG PO TABS: 20.0000 mg | ORAL_TABLET | Freq: Every day | ORAL | Status: DC

## 2016-03-03 NOTE — Discharge Instructions (Signed)
For your ongoing mental health needs, you are advised to follow up with [facility].  New and returning patients are seen at their walk-in clinic.  Walk-in hours are Monday - Friday from 8:00 am - 3:00 pm.  Walk-in patients are seen on a first come, first served basis.  Try to arrive as early as possible for he best chance of being seen the same day: ° °     [facility] °     [address] °     [place], [place] [address] °     [phone] °

## 2016-03-03 NOTE — BH Assessment (Signed)
[facility] Assessment Progress Note  Per Corena Pilgrim, MD, this pt does not require psychiatric hospitalization at this time.  Pt is to be discharged from [facility] with recommendation to follow up with [facility].  This has been included in pt's discharge instructions.  Pt's nurse, Anderson Malta, has been notified.  Jalene Mullet, [place] Triage Specialist [phone]

## 2016-03-03 NOTE — BHH Suicide Risk Assessment (Signed)
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 Cocaine-induced mood disorder ([place]) [F14.94] 03/03/2016    Priority: High  . Cocaine abuse [F14.10] 03/03/2016    Priority: High  . Suicidal ideation [R45.851]   . Reflux esophagitis [K21.0] 05/24/2013  . Other specified gastritis without mention of hemorrhage [K29.60] 05/24/2013  . Nausea with vomiting [R11.2] 05/23/2013  . Anemia, unspecified [D64.9] 05/23/2013  . Nonspecific abnormal finding in stool contents [R19.5] 05/23/2013  . Schizophrenia ([place]) [F20.9]   . Ulcerative colitis ([place]) [K51.90]   . SUBSTANCE ABUSE, MULTIPLE [F19.10] 12/31/2007    Total Time spent with patient: 45 minutes  Musculoskeletal: Strength & Muscle Tone: within normal limits Gait & Station: normal Patient leans: N/A  Psychiatric Specialty Exam: Review of Systems  Constitutional: Negative.   HENT: Negative.   Eyes: Negative.   Respiratory: Negative.   Cardiovascular: Negative.   Gastrointestinal: Negative.   Genitourinary: Negative.   Musculoskeletal: Negative.   Skin: Negative.   Neurological: Negative.   Endo/Heme/Allergies: Negative.   Psychiatric/Behavioral: Positive for substance abuse.    Blood pressure 125/72, pulse 64, temperature 97.7 F (36.5 C), temperature source Oral, resp. rate 19, SpO2 100 %.There is no weight on file to calculate BMI.  General Appearance: Casual  Eye Contact::  Good  Speech:  Normal Rate  Volume:  Normal  Mood:  Euthymic  Affect:  Congruent  Thought Process:  Coherent  Orientation:  Full (Time, Place, and Person)  Thought Content:  WDL  Suicidal Thoughts:  No  Homicidal Thoughts:  No  Memory:  Immediate;   Good Recent;   Good Remote;   Good  Judgement:  Fair  Insight:  Fair  Psychomotor Activity:  Normal  Concentration:  Good   Recall:  Good  Fund of Knowledge:Good  Language: Good  Akathisia:  No  Handed:  Right  AIMS (if indicated):     Assets:  Leisure Time Physical Health Resilience  ADL's:  Intact  Cognition: WNL  Sleep:       Mental Status Per Nursing Assessment::   On Admission:   cocaine abuse  Demographic Factors:  Male  Loss Factors: NA  Historical Factors: NA  Risk Reduction Factors:   Sense of responsibility to family  Continued Clinical Symptoms:  None  Cognitive Features That Contribute To Risk:  None    Suicide Risk:  Minimal: No identifiable suicidal ideation.  Patients presenting with no risk factors but with morbid ruminations; may be classified as minimal risk based on the severity of the depressive symptoms    Plan Of Care/Follow-up recommendations:  Activity:  as tolerated Diet:  heart healthy diet  LORD, JAMISON, NP 03/03/2016, 10:20 AM

## 2016-03-03 NOTE — Consult Note (Signed)
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 Cocaine-induced mood disorder ([place]) [F14.94] 03/03/2016    Priority: High  . Cocaine abuse [F14.10] 03/03/2016    Priority: High  . Suicidal ideation [R45.851]   . Reflux esophagitis [K21.0] 05/24/2013  . Other specified gastritis without mention of hemorrhage [K29.60] 05/24/2013  . Nausea with vomiting [R11.2] 05/23/2013  . Anemia, unspecified [D64.9] 05/23/2013  . Nonspecific abnormal finding in stool contents [R19.5] 05/23/2013  . Schizophrenia ([place]) [F20.9]   . Ulcerative colitis ([place]) [K51.90]   . SUBSTANCE ABUSE, MULTIPLE [F19.10] 12/31/2007    Total Time spent with patient: 45 minutes  Subjective:   [name] is a 35 y.o. male patient does not warrant admission.  HPI:  35 yo male who presented to the ED with suicidal ideations after using cocaine.  Today, "I feel awesome."  Denies suicidal/homicidal ideations, hallucinations, and withdrawal symptoms.  Stable for discharge and he wants to leave and follow-up with [facility].  Past Psychiatric History: substance abuse, cocaine  Risk to Self: Suicidal Ideation: Yes-Currently Present Suicidal Intent: No Is patient at risk for suicide?: No Suicidal Plan?: No Access to Means: No What has been your use of drugs/alcohol within the last 12 months?: THC How many times?: 0 Other Self Harm Risks: Denies Triggers for Past Attempts: None known Intentional Self Injurious Behavior: None Risk to Others: Homicidal Ideation: No Thoughts of Harm to Others: No Current Homicidal Intent: No Current Homicidal Plan: No Access to Homicidal Means: No Identified Victim: Denies History of harm to others?: No Assessment of Violence: None Noted Violent Behavior  Description: Denies Does patient have access to weapons?: No Criminal Charges Pending?: No Does patient have a court date: No Prior Inpatient Therapy: Prior Inpatient Therapy: Yes Prior Therapy Dates: 2014, 2017 Prior Therapy Facilty/Provider(s): [facility], [place] Reason for Treatment: SI/Schizophrenia Prior Outpatient Therapy: Prior Outpatient Therapy: No Prior Therapy Dates: N/A Prior Therapy Facilty/Provider(s): N/A Reason for Treatment: N/A Does patient have an ACCT team?: No Does patient have Intensive In-House Services?  : No Does patient have [facility] services? : Yes (prescriptions) Does patient have [facility] services?: No  Past Medical History:  Past Medical History  Diagnosis Date  . Ulcerative colitis   . Schizophrenia ([place])   . GI (gastrointestinal bleed) 04/2013  . Crohn's disease ([place])   . Suicide attempt by drug ingestion ([place])   . Sickle cell anemia (H[facility])   . Multiple myeloma [facility])     Past Surgical History  Procedure Laterality Date  . Mandible fracture surgery    . Flexible sigmoidoscopy  12/05/2011    Procedure: FLEXIBLE SIGMOIDOSCOPY;  Surgeon: Beryle Beams, MD;  Location: [facility] ENDOSCOPY;  Service: Endoscopy;  Laterality: N/A;  . Esophagogastroduodenoscopy N/A 05/24/2013    Procedure: ESOPHAGOGASTRODUODENOSCOPY (EGD);  Surgeon: Ladene Artist, MD;  Location: [facility] ENDOSCOPY;  Service: Endoscopy;  Laterality: N/A;   Family History:  Family History  Problem Relation Age of Onset  . Liver cancer     Family Psychiatric  History: NOne Social History:  History  Alcohol Use  . Yes    Comment: quit     History  Drug Use  . Yes  . Special: Cocaine, Marijuana    Comment: quit 1 mo ago    Social History   Social History  .  Marital Status: Single    Spouse Name: N/A  . Number of Children: N/A  . Years of Education: GED   Occupational History  . disabled    Social History Main Topics  . Smoking status: Current Every Day Smoker -- 0.25 packs/day for 20 years     Types: Cigarettes  . Smokeless tobacco: Never Used  . Alcohol Use: Yes     Comment: quit  . Drug Use: Yes    Special: Cocaine, Marijuana     Comment: quit 1 mo ago  . Sexual Activity: Not Asked   Other Topics Concern  . None   Social History Narrative   Mr. Don was recently released from prison, currently living at home with his mother.  He is disabled (due to mental illness?).    Additional Social History:    Allergies:   Allergies  Allergen Reactions  . Depakote [Divalproex Sodium] Other (See Comments)    Reaction: seizures   . Lithium Other (See Comments)    Reaction: rectal bleeding  . Nsaids Other (See Comments)    Reaction: rectal bleeding  . Risperidone And Related     Chest pains    Labs:  Results for orders placed or performed during the hospital encounter of 03/02/16 (from the past 48 hour(s))  Rapid urine drug screen (hospital performed)     Status: Abnormal   Collection Time: 03/02/16  6:03 PM  Result Value Ref Range   Opiates NONE DETECTED NONE DETECTED   Cocaine POSITIVE (A) NONE DETECTED   Benzodiazepines NONE DETECTED NONE DETECTED   Amphetamines NONE DETECTED NONE DETECTED   Tetrahydrocannabinol NONE DETECTED NONE DETECTED   Barbiturates NONE DETECTED NONE DETECTED    Comment:        DRUG SCREEN FOR MEDICAL PURPOSES ONLY.  IF CONFIRMATION IS NEEDED FOR ANY PURPOSE, NOTIFY LAB WITHIN 5 DAYS.        LOWEST DETECTABLE LIMITS FOR URINE DRUG SCREEN Drug Class       Cutoff (ng/mL) Amphetamine      1000 Barbiturate      200 Benzodiazepine   726 Tricyclics       203 Opiates          300 Cocaine          300 THC              50   Comprehensive metabolic panel     Status: Abnormal   Collection Time: 03/02/16  6:12 PM  Result Value Ref Range   Sodium 137 135 - 145 mmol/L   Potassium 3.7 3.5 - 5.1 mmol/L   Chloride 103 101 - 111 mmol/L   CO2 27 22 - 32 mmol/L   Glucose, Bld 117 (H) 65 - 99 mg/dL   BUN 14 6 - 20 mg/dL   Creatinine, Ser 0.93  0.61 - 1.24 mg/dL   Calcium 9.4 8.9 - 10.3 mg/dL   Total Protein 8.1 6.5 - 8.1 g/dL   Albumin 4.5 3.5 - 5.0 g/dL   AST 35 15 - 41 U/L   ALT 26 17 - 63 U/L   Alkaline Phosphatase 88 38 - 126 U/L   Total Bilirubin 0.6 0.3 - 1.2 mg/dL   GFR calc non Af Amer >60 >60 mL/min   GFR calc Af Amer >60 >60 mL/min    Comment: (NOTE) The eGFR has been calculated using the CKD EPI equation. This calculation has not been validated in all clinical situations. eGFR's persistently <60 mL/min signify possible  Chronic Kidney Disease.    Anion gap 7 5 - 15  Ethanol     Status: None   Collection Time: 03/02/16  6:12 PM  Result Value Ref Range   Alcohol, Ethyl (B) <5 <5 mg/dL    Comment:        LOWEST DETECTABLE LIMIT FOR SERUM ALCOHOL IS 5 mg/dL FOR MEDICAL PURPOSES ONLY   Salicylate level     Status: None   Collection Time: 03/02/16  6:12 PM  Result Value Ref Range   Salicylate Lvl <2.5 2.8 - 30.0 mg/dL  Acetaminophen level     Status: Abnormal   Collection Time: 03/02/16  6:12 PM  Result Value Ref Range   Acetaminophen (Tylenol), Serum <10 (L) 10 - 30 ug/mL    Comment:        THERAPEUTIC CONCENTRATIONS VARY SIGNIFICANTLY. A RANGE OF 10-30 ug/mL MAY BE AN EFFECTIVE CONCENTRATION FOR MANY PATIENTS. HOWEVER, SOME ARE BEST TREATED AT CONCENTRATIONS OUTSIDE THIS RANGE. ACETAMINOPHEN CONCENTRATIONS >150 ug/mL AT 4 HOURS AFTER INGESTION AND >50 ug/mL AT 12 HOURS AFTER INGESTION ARE OFTEN ASSOCIATED WITH TOXIC REACTIONS.   cbc     Status: None   Collection Time: 03/02/16  6:12 PM  Result Value Ref Range   WBC 9.0 4.0 - 10.5 K/uL   RBC 5.59 4.22 - 5.81 MIL/uL   Hemoglobin 16.3 13.0 - 17.0 g/dL   HCT 48.3 39.0 - 52.0 %   MCV 86.4 78.0 - 100.0 fL   MCH 29.2 26.0 - 34.0 pg   MCHC 33.7 30.0 - 36.0 g/dL   RDW 15.4 11.5 - 15.5 %   Platelets 346 150 - 400 K/uL    Current Facility-Administered Medications  Medication Dose Route Frequency Provider Last Rate Last Dose  . albuterol  (PROVENTIL HFA;VENTOLIN HFA) 108 (90 Base) MCG/ACT inhaler 2 puff  2 puff Inhalation Q4H PRN Delsa Grana, PA-C      . alum & mag hydroxide-simeth (MAALOX/MYLANTA) 200-200-20 MG/5ML suspension 30 mL  30 mL Oral PRN Sherwood Gambler, MD      . LORazepam (ATIVAN) tablet 1 mg  1 mg Oral Q8H PRN Sherwood Gambler, MD      . nicotine (NICODERM CQ - dosed in mg/24 hours) patch 21 mg  21 mg Transdermal Daily Sherwood Gambler, MD   21 mg at 03/02/16 2035  . OLANZapine (ZYPREXA) tablet 20 mg  20 mg Oral QHS Lurena Nida, NP   20 mg at 03/02/16 2052  . ondansetron (ZOFRAN) tablet 4 mg  4 mg Oral Q8H PRN Sherwood Gambler, MD      . zolpidem (AMBIEN) tablet 5 mg  5 mg Oral QHS PRN Sherwood Gambler, MD       Current Outpatient Prescriptions  Medication Sig Dispense Refill  . OLANZapine (ZYPREXA) 20 MG tablet Take 20 mg by mouth at bedtime.    . pseudoephedrine-acetaminophen (TYLENOL SINUS) 30-500 MG TABS tablet Take 2 tablets by mouth every 4 (four) hours as needed (sinus congestion).     Facility-Administered Medications Ordered in Other Encounters  Medication Dose Route Frequency Provider Last Rate Last Dose  . hydrOXYzine (ATARAX/VISTARIL) tablet 50 mg  50 mg Oral TID PRN Encarnacion Slates, NP      . magnesium hydroxide (MILK OF MAGNESIA) suspension 30 mL  30 mL Oral Daily PRN Encarnacion Slates, NP        Musculoskeletal: Strength & Muscle Tone: within normal limits Gait & Station: normal Patient leans: N/A  Psychiatric Specialty Exam: Review of  Systems  Constitutional: Negative.   HENT: Negative.   Eyes: Negative.   Respiratory: Negative.   Cardiovascular: Negative.   Gastrointestinal: Negative.   Genitourinary: Negative.   Musculoskeletal: Negative.   Skin: Negative.   Neurological: Negative.   Endo/Heme/Allergies: Negative.   Psychiatric/Behavioral: Positive for substance abuse.    Blood pressure 125/72, pulse 64, temperature 97.7 F (36.5 C), temperature source Oral, resp. rate 19, SpO2 100 %.There is  no weight on file to calculate BMI.  General Appearance: Casual  Eye Contact::  Good  Speech:  Normal Rate  Volume:  Normal  Mood:  Euthymic  Affect:  Congruent  Thought Process:  Coherent  Orientation:  Full (Time, Place, and Person)  Thought Content:  WDL  Suicidal Thoughts:  No  Homicidal Thoughts:  No  Memory:  Immediate;   Good Recent;   Good Remote;   Good  Judgement:  Fair  Insight:  Fair  Psychomotor Activity:  Normal  Concentration:  Good  Recall:  Good  Fund of Knowledge:Good  Language: Good  Akathisia:  No  Handed:  Right  AIMS (if indicated):     Assets:  Leisure Time Physical Health Resilience  ADL's:  Intact  Cognition: WNL  Sleep:      Treatment Plan Summary: Daily contact with patient to assess and evaluate symptoms and progress in treatment, Medication management and Plan cocaine induced mood disorder:  -Crisis stabilization -Medication management:  Started Zyprexa 20 mg at bedtime for schizophrenia -Individual and substance abuse counseling -Rx and referral to [facility] provided  Disposition: No evidence of imminent risk to self or others at present.    Waylan Boga, NP 03/03/2016 10:14 AM Patient seen face-to-face for psychiatric evaluation, chart reviewed and case discussed with the physician extender and developed treatment plan. Reviewed the information documented and agree with the treatment plan. Corena Pilgrim, MD

## 2016-03-03 NOTE — ED Notes (Signed)
Patient met with treatment team this am and they felt he was ready for discharge today. Patient given discharge instructions and bus pass.

## 2016-03-03 NOTE — Progress Notes (Signed)
Pt with 6 ED visits in the last 6 months no admission Last [facility] admission in September of 2014  Pt last ED visits for abdominal pain, injuries and SI Pt with medicaid pcp assigned No need for home health or medication interventions  Medicaid cost of medications are $3 or less

## 2016-03-18 ENCOUNTER — Emergency Department (HOSPITAL_COMMUNITY)
Admission: EM | Admit: 2016-03-18 | Discharge: 2016-03-18 | Discharge status: Absent without leave | Payer: Medicare Other

## 2016-03-18 ENCOUNTER — Emergency Department (HOSPITAL_COMMUNITY)
Admission: EM | Admit: 2016-03-18 | Discharge: 2016-03-19 | Disposition: A | Discharge status: Home / Self Care | Payer: Medicare Other | Attending: Emergency Medicine | Admitting: Emergency Medicine

## 2016-03-18 DIAGNOSIS — F1424 Cocaine dependence with cocaine-induced mood disorder: Secondary | ICD-10-CM | POA: Diagnosis not present

## 2016-03-18 DIAGNOSIS — R440 Auditory hallucinations: Secondary | ICD-10-CM | POA: Diagnosis not present

## 2016-03-18 DIAGNOSIS — F200 Paranoid schizophrenia: Secondary | ICD-10-CM | POA: Diagnosis not present

## 2016-03-18 DIAGNOSIS — F17210 Nicotine dependence, cigarettes, uncomplicated: Secondary | ICD-10-CM | POA: Diagnosis not present

## 2016-03-18 DIAGNOSIS — F99 Mental disorder, not otherwise specified: Secondary | ICD-10-CM | POA: Diagnosis present

## 2016-03-18 DIAGNOSIS — F29 Unspecified psychosis not due to a substance or known physiological condition: Secondary | ICD-10-CM | POA: Diagnosis not present

## 2016-03-18 LAB — COMPREHENSIVE METABOLIC PANEL
ALBUMIN: 4.7 g/dL (ref 3.5–5.0)
ALT: 35 U/L (ref 17–63)
AST: 86 U/L — AB (ref 15–41)
Alkaline Phosphatase: 106 U/L (ref 38–126)
Anion gap: 10 (ref 5–15)
BILIRUBIN TOTAL: 2.3 mg/dL — AB (ref 0.3–1.2)
BUN: 18 mg/dL (ref 6–20)
CHLORIDE: 105 mmol/L (ref 101–111)
CO2: 26 mmol/L (ref 22–32)
Calcium: 9.2 mg/dL (ref 8.9–10.3)
Creatinine, Ser: 0.96 mg/dL (ref 0.61–1.24)
GFR calc Af Amer: >60 mL/min (ref >60)
GFR calc non Af Amer: >60 mL/min (ref >60)
GLUCOSE: 124 mg/dL — AB (ref 65–99)
POTASSIUM: 3.3 mmol/L — AB (ref 3.5–5.1)
SODIUM: 141 mmol/L (ref 135–145)
TOTAL PROTEIN: 8.4 g/dL — AB (ref 6.5–8.1)

## 2016-03-18 LAB — CBC WITH DIFFERENTIAL/PLATELET
BASOS ABS: 0.1 10*3/uL (ref 0.0–0.1)
BASOS PCT: 1 %
EOS ABS: 1.6 10*3/uL — AB (ref 0.0–0.7)
Eosinophils Relative: 12 %
HEMATOCRIT: 47.9 % (ref 39.0–52.0)
HEMOGLOBIN: 16.5 g/dL (ref 13.0–17.0)
Lymphocytes Relative: 18 %
Lymphs Abs: 2.6 10*3/uL (ref 0.7–4.0)
MCH: 29 pg (ref 26.0–34.0)
MCHC: 34.4 g/dL (ref 30.0–36.0)
MCV: 84.3 fL (ref 78.0–100.0)
MONO ABS: 1.8 10*3/uL — AB (ref 0.1–1.0)
Monocytes Relative: 13 %
NEUTROS ABS: 8 10*3/uL — AB (ref 1.7–7.7)
NEUTROS PCT: 56 %
Platelets: 336 10*3/uL (ref 150–400)
RBC: 5.68 MIL/uL (ref 4.22–5.81)
RDW: 14.9 % (ref 11.5–15.5)
WBC: 14.1 10*3/uL — ABNORMAL HIGH (ref 4.0–10.5)

## 2016-03-18 LAB — RAPID URINE DRUG SCREEN, HOSP PERFORMED
AMPHETAMINES: NOT DETECTED
BARBITURATES: NOT DETECTED
BENZODIAZEPINES: NOT DETECTED
COCAINE: POSITIVE — AB
Opiates: NOT DETECTED
TETRAHYDROCANNABINOL: POSITIVE — AB

## 2016-03-18 LAB — ETHANOL: Alcohol, Ethyl (B): <5 mg/dL (ref <5)

## 2016-03-18 MED ORDER — ZIPRASIDONE MESYLATE 20 MG IM SOLR
20.0000 mg | Freq: Once | INTRAMUSCULAR | Status: AC
  Administered 2016-03-18: 20 mg via INTRAMUSCULAR

## 2016-03-18 MED ORDER — BACITRACIN ZINC 500 UNIT/GM EX OINT
TOPICAL_OINTMENT | CUTANEOUS | Status: AC
  Administered 2016-03-18: 21:00:00
  Filled 2016-03-18: qty 0.9

## 2016-03-18 MED ORDER — ZIPRASIDONE MESYLATE 20 MG IM SOLR
INTRAMUSCULAR | Status: AC
  Filled 2016-03-18: qty 20

## 2016-03-18 MED ORDER — STERILE WATER FOR INJECTION IJ SOLN
INTRAMUSCULAR | Status: AC
  Administered 2016-03-18: 20:00:00
  Filled 2016-03-18: qty 10

## 2016-03-18 MED ORDER — LORAZEPAM 2 MG/ML IJ SOLN
2.0000 mg | Freq: Once | INTRAMUSCULAR | Status: AC
  Administered 2016-03-18: 2 mg via INTRAMUSCULAR
  Filled 2016-03-18: qty 1

## 2016-03-18 NOTE — ED Notes (Signed)
Bed: WA16 Expected date:  Expected time:  Means of arrival:  Comments: 
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
## 2016-03-19 DIAGNOSIS — F1424 Cocaine dependence with cocaine-induced mood disorder: Secondary | ICD-10-CM | POA: Diagnosis present

## 2016-03-19 MED ORDER — OLANZAPINE 10 MG PO TABS: 20.0000 mg | ORAL_TABLET | Freq: Every day | ORAL | Status: DC

## 2016-03-19 MED ORDER — NICOTINE POLACRILEX 2 MG MT GUM
2.0000 mg | CHEWING_GUM | OROMUCOSAL | Status: DC | PRN
  Administered 2016-03-19: 2 mg via ORAL
  Filled 2016-03-19: qty 1

## 2016-03-19 NOTE — Consult Note (Signed)
[facility] Consult   Reason for Consult:  Cocaine abuse with hallucinations Referring Physician:  EDP Patient Identification: [name] MRN:  [id number] Principal Diagnosis: Cocaine dependence with cocaine-induced mood disorder [facility]) Diagnosis:   Patient Active Problem List   Diagnosis Date Noted  . Cocaine dependence with cocaine-induced mood disorder [facility]) [F14.24] 03/19/2016    Priority: High    Total Time spent with patient: 45 minutes  Subjective:   [name] is a 35 y.o. male patient does not warrant admission.  HPI:  35 yo male who presented to the ED with feeling he was bit by a snake while under the influence of crack/cocaine.  Today, he does think a snake bit him but feels much better.  Does not feel snakes are crawling on him or hearing voices.  Denies suicidal/homicidal ideations and withdrawal symptoms.  He would like to go home, stable for discharge.  Patient at [facility].  Past Psychiatric History: cocaine abuse,psychosis  Risk to Self: Suicidal Ideation: No Suicidal Intent: No Is patient at risk for suicide?: No Suicidal Plan?: No Access to Means: No What has been your use of drugs/alcohol within the last 12 months?:  (n/a) How many times?:  (n/a) Other Self Harm Risks:  (n/a) Triggers for Past Attempts: Other (Comment) (denies ) Intentional Self Injurious Behavior: None Risk to Others: Homicidal Ideation: No Thoughts of Harm to Others: No Current Homicidal Intent: No Current Homicidal Plan: No Access to Homicidal Means: No Identified Victim:  (n/a) History of harm to others?: No Assessment of Violence: None Noted Violent Behavior Description:  (patient is calm and cooperative) Does patient have access to weapons?: No Criminal Charges Pending?: No Does patient have a court date: No Prior Inpatient Therapy: Prior Inpatient Therapy: Yes Prior Therapy Dates:  [facility]- 4x's (patient unable to recall dates)) Prior Therapy  Facilty/Provider(s):  [facility]) Reason for Treatment:  ("hearing voices") Prior Outpatient Therapy:    Past Medical History: No past medical history on file. No past surgical history on file. Family History: No family history on file. Family Psychiatric  History: none Social History:  History  Alcohol Use: Not on file     History  Drug Use Not on file    Social History   Social History  . Marital Status: Significant Other    Spouse Name: N/A  . Number of Children: N/A  . Years of Education: N/A   Social History Main Topics  . Smoking status: Not on file  . Smokeless tobacco: Not on file  . Alcohol Use: Not on file  . Drug Use: Not on file  . Sexual Activity: Not on file   Other Topics Concern  . Not on file   Social History Narrative  . No narrative on file   Additional Social History:    Allergies:   Allergies  Allergen Reactions  . Risperdal [Risperidone] Shortness Of Breath  . Depakote [Divalproex Sodium]     Makes him have seizures.  . Lithium Diarrhea    With blood   . Nsaids Itching    Labs:  Results for orders placed or performed during the hospital encounter of 03/18/16 (from the past 48 hour(s))  Urine rapid drug screen (hosp performed)     Status: Abnormal   Collection Time: 03/18/16  7:19 PM  Result Value Ref Range   Opiates NONE DETECTED NONE DETECTED   Cocaine POSITIVE (A) NONE DETECTED   Benzodiazepines NONE DETECTED NONE DETECTED   Amphetamines NONE DETECTED NONE DETECTED  Tetrahydrocannabinol POSITIVE (A) NONE DETECTED   Barbiturates NONE DETECTED NONE DETECTED    Comment:        DRUG SCREEN FOR MEDICAL PURPOSES ONLY.  IF CONFIRMATION IS NEEDED FOR ANY PURPOSE, NOTIFY LAB WITHIN 5 DAYS.        LOWEST DETECTABLE LIMITS FOR URINE DRUG SCREEN Drug Class       Cutoff (ng/mL) Amphetamine      1000 Barbiturate      200 Benzodiazepine   024 Tricyclics       097 Opiates          300 Cocaine          300 THC              50   CBC with  Differential/Platelet     Status: Abnormal   Collection Time: 03/18/16  7:37 PM  Result Value Ref Range   WBC 14.1 (H) 4.0 - 10.5 K/uL   RBC 5.68 4.22 - 5.81 MIL/uL   Hemoglobin 16.5 13.0 - 17.0 g/dL   HCT 47.9 39.0 - 52.0 %   MCV 84.3 78.0 - 100.0 fL   MCH 29.0 26.0 - 34.0 pg   MCHC 34.4 30.0 - 36.0 g/dL   RDW 14.9 11.5 - 15.5 %   Platelets 336 150 - 400 K/uL   Neutrophils Relative % 56 %   Neutro Abs 8.0 (H) 1.7 - 7.7 K/uL   Lymphocytes Relative 18 %   Lymphs Abs 2.6 0.7 - 4.0 K/uL   Monocytes Relative 13 %   Monocytes Absolute 1.8 (H) 0.1 - 1.0 K/uL   Eosinophils Relative 12 %   Eosinophils Absolute 1.6 (H) 0.0 - 0.7 K/uL   Basophils Relative 1 %   Basophils Absolute 0.1 0.0 - 0.1 K/uL  Comprehensive metabolic panel     Status: Abnormal   Collection Time: 03/18/16  7:37 PM  Result Value Ref Range   Sodium 141 135 - 145 mmol/L   Potassium 3.3 (L) 3.5 - 5.1 mmol/L   Chloride 105 101 - 111 mmol/L   CO2 26 22 - 32 mmol/L   Glucose, Bld 124 (H) 65 - 99 mg/dL   BUN 18 6 - 20 mg/dL   Creatinine, Ser 0.96 0.61 - 1.24 mg/dL   Calcium 9.2 8.9 - 10.3 mg/dL   Total Protein 8.4 (H) 6.5 - 8.1 g/dL   Albumin 4.7 3.5 - 5.0 g/dL   AST 86 (H) 15 - 41 U/L   ALT 35 17 - 63 U/L   Alkaline Phosphatase 106 38 - 126 U/L   Total Bilirubin 2.3 (H) 0.3 - 1.2 mg/dL   GFR calc non Af Amer >60 >60 mL/min   GFR calc Af Amer >60 >60 mL/min    Comment: (NOTE) The eGFR has been calculated using the CKD EPI equation. This calculation has not been validated in all clinical situations. eGFR's persistently <60 mL/min signify possible Chronic Kidney Disease.    Anion gap 10 5 - 15  Ethanol     Status: None   Collection Time: 03/18/16  7:37 PM  Result Value Ref Range   Alcohol, Ethyl (B) <5 <5 mg/dL    Comment:        LOWEST DETECTABLE LIMIT FOR SERUM ALCOHOL IS 5 mg/dL FOR MEDICAL PURPOSES ONLY     Current Facility-Administered Medications  Medication Dose Route Frequency Provider Last Rate  Last Dose  . nicotine polacrilex (NICORETTE) gum 2 mg  2 mg Oral PRN Dara Hoyer,  PA-C   2 mg at 03/19/16 2257   Current Outpatient Prescriptions  Medication Sig Dispense Refill  . OLANZapine (ZYPREXA) 20 MG tablet Take 20 mg by mouth at bedtime.      Musculoskeletal: Strength & Muscle Tone: within normal limits Gait & Station: normal Patient leans: N/A  Psychiatric Specialty Exam: Physical Exam  Constitutional: He is oriented to person, place, and time. He appears well-developed and well-nourished.  HENT:  Head: Normocephalic.  Neck: Normal range of motion.  Respiratory: Effort normal.  GI: Soft.  Musculoskeletal: Normal range of motion.  Neurological: He is alert and oriented to person, place, and time.  Skin: Skin is warm and dry.  Psychiatric: He has a normal mood and affect. His speech is normal and behavior is normal. Judgment and thought content normal. Cognition and memory are normal.    Review of Systems  Constitutional: Negative.   HENT: Negative.   Eyes: Negative.   Respiratory: Negative.   Cardiovascular: Negative.   Gastrointestinal: Negative.   Genitourinary: Negative.   Musculoskeletal: Negative.   Skin: Negative.   Neurological: Negative.   Endo/Heme/Allergies: Negative.   Psychiatric/Behavioral: Positive for substance abuse.    Blood pressure 110/72, pulse 72, temperature 97.9 F (36.6 C), temperature source Oral, resp. rate 16, SpO2 100 %.There is no height or weight on file to calculate BMI.  General Appearance: Casual  Eye Contact:  Good  Speech:  Normal Rate  Volume:  Normal  Mood:  Euthymic  Affect:  Congruent  Thought Process:  Coherent  Orientation:  Full (Time, Place, and Person)  Thought Content:  WDL  Suicidal Thoughts:  No  Homicidal Thoughts:  No  Memory:  Immediate;   Good Recent;   Good Remote;   Good  Judgement:  Fair  Insight:  Fair  Psychomotor Activity:  Normal  Concentration:  Concentration: Good and Attention Span:  Good  Recall:  Good  Fund of Knowledge:  Good  Language:  Good  Akathisia:  No  Handed:  Right  AIMS (if indicated):     Assets:  Leisure Time Physical Health Resilience Social Support  ADL's:  Intact  Cognition:  WNL  Sleep:        Treatment Plan Summary: Daily contact with patient to assess and evaluate symptoms and progress in treatment, Medication management and Plan cocaine induced mood disorder:  -Crisis stabilization -Medication management:  One time dose of Geodon 20 mg IM and Ativan 2 mg IM for agitation on arrival.  Restart Zyprexa 20 mg at bedtime for psychosis -Individual and substance abuse counseling  Disposition: No evidence of imminent risk to self or others at present.    Waylan Boga, NP 03/19/2016 10:30 AM Patient seen face-to-face for psychiatric evaluation, chart reviewed and case discussed with the physician extender and developed treatment plan. Reviewed the information documented and agree with the treatment plan. Corena Pilgrim, MD

## 2016-03-19 NOTE — ED Notes (Signed)
Patient noted sleeping in room. No complaints, stable, in no acute distress. Q15 minute rounds and monitoring via [employer] to continue.

## 2016-03-19 NOTE — BH Assessment (Addendum)
Assessment Note  [name] is an 35 y.o. male. He presented to [facility] via EMS. Patient phoned 911/EMS telling them that he was bitten by multiple snakes. Patient stating that he lives with his mother. He was in his front yard at his mothers home and this is where he was bitten by snakes. Sts he has snake bites on his feet. He has a history of hearing voices and seeing things. Today patient denies AVH's. He is compliant with his psych medications. He receives medication management at [facility]. Patient denies HI. He reports use of cocaine and THC. He uses $20 worth of cocaine occasionally and smokes 2 blunts of THC daily. Last use was 2 days ago. Patient hospitalized at [facility] approximately 4x's in the past for hallucinations.   Diagnosis: Schizophrenia and Substance Use Disorder  Past Medical History: No past medical history on file.  No past surgical history on file.  Family History: No family history on file.  Social History:  has no tobacco, alcohol, and drug history on file.  Additional Social History:  Alcohol / Drug Use Pain Medications: SEE MAR Prescriptions: SEE MAR Over the Counter: SEE MAR History of alcohol / drug use?: Yes Longest period of sobriety (when/how long): unk Negative Consequences of Use: Personal relationships Substance #1 Name of Substance 1: Cocaine  1 - Age of First Use: 35 yrs old  1 - Amount (size/oz): $20 per use 1 - Frequency: "Not that often" 1 - Duration: on-going  1 - Last Use / Amount: 2 days ago Substance #2 Name of Substance 2: THC 2 - Age of First Use: 35 yrs old  2 - Amount (size/oz): 2 blunts  2 - Frequency: daily  2 - Duration: "Not that often" 2 - Last Use / Amount: 2 days ago  CIWA: CIWA-Ar BP: 110/72 mmHg Pulse Rate: 72 COWS:    Allergies: No Known Allergies  Home Medications:  (Not in a hospital admission)  OB/GYN Status:  No LMP for male patient.  General Assessment Data Location of Assessment: [facility] ED TTS Assessment: In  system Is this a Tele or Face-to-Face Assessment?: Face-to-Face Is this an Initial Assessment or a Re-assessment for this encounter?: Initial Assessment Marital status: Single Maiden name:  (n/a) Is patient pregnant?: No Pregnancy Status: No Living Arrangements: Other (Comment), Parent (lives with mother) Can pt return to current living arrangement?: No Admission Status: Voluntary Is patient capable of signing voluntary admission?: No Referral Source: Self/Family/Friend     Crisis Care Plan Living Arrangements: Other (Comment), Parent (lives with mother) Legal Guardian: Other: (no legal guardian ) Name of Psychiatrist:  Consulting civil engineer ) Name of Therapist:  Consulting civil engineer )  Education Status Is patient currently in school?: No Current Grade:  (n/a) Highest grade of school patient has completed:  (HS (currently in college)) Name of school:  (n/a) Contact person:  (n/a)  Risk to self with the past 6 months Suicidal Ideation: No Has patient been a risk to self within the past 6 months prior to admission? : No Suicidal Intent: No Has patient had any suicidal intent within the past 6 months prior to admission? : No Is patient at risk for suicide?: No Suicidal Plan?: No Has patient had any suicidal plan within the past 6 months prior to admission? : No Access to Means: No What has been your use of drugs/alcohol within the last 12 months?:  (n/a) Previous Attempts/Gestures: No How many times?:  (n/a) Other Self Harm Risks:  (n/a) Triggers for Past Attempts:  Other (Comment) (denies ) Intentional Self Injurious Behavior: None Family Suicide History: No Recent stressful life event(s): Other (Comment) ("I was bit by a snake") Persecutory voices/beliefs?: No Depression: Yes Depression Symptoms: Feeling angry/irritable, Loss of interest in usual pleasures, Feeling worthless/self pity, Guilt, Fatigue, Isolating, Tearfulness, Insomnia, Despondent Substance abuse history and/or treatment for  substance abuse?: No Suicide prevention information given to non-admitted patients: Not applicable  Risk to Others within the past 6 months Homicidal Ideation: No Does patient have any lifetime risk of violence toward others beyond the six months prior to admission? : No Thoughts of Harm to Others: No Current Homicidal Intent: No Current Homicidal Plan: No Access to Homicidal Means: No Identified Victim:  (n/a) History of harm to others?: No Assessment of Violence: None Noted Violent Behavior Description:  (patient is calm and cooperative) Does patient have access to weapons?: No Criminal Charges Pending?: No Does patient have a court date: No Is patient on probation?: No  Psychosis Hallucinations:  (patient denies but has a history of AVH's) Delusions: None noted  Mental Status Report Appearance/Hygiene: Disheveled Eye Contact: Good Motor Activity: Freedom of movement Speech: Logical/coherent Level of Consciousness: Alert Mood: Depressed Affect: Appropriate to circumstance Anxiety Level: None Thought Processes: Coherent, Relevant Judgement: Impaired Orientation: Person, Situation, Place, Time Obsessive Compulsive Thoughts/Behaviors: None  Cognitive Functioning Concentration: Decreased Memory: Recent Intact, Remote Intact IQ: Average Insight: Fair Impulse Control: Fair Appetite: Good Weight Loss:  (n/a) Weight Gain:  (n/a) Sleep: Decreased Total Hours of Sleep:  (varies ) Vegetative Symptoms: None  ADLScreening [facility] Assessment Services) Patient's cognitive ability adequate to safely complete daily activities?: Yes Patient able to express need for assistance with ADLs?: Yes Independently performs ADLs?: No  Prior Inpatient Therapy Prior Inpatient Therapy: Yes Prior Therapy Dates:  [facility]- 4x's (patient unable to recall dates)) Prior Therapy Facilty/Provider(s):  [facility]) Reason for Treatment:  ("hearing voices")     ADL Screening (condition at time of  admission) Patient's cognitive ability adequate to safely complete daily activities?: Yes Is the patient deaf or have difficulty hearing?: No Does the patient have difficulty seeing, even when wearing glasses/contacts?: No Does the patient have difficulty concentrating, remembering, or making decisions?: Yes Patient able to express need for assistance with ADLs?: Yes Does the patient have difficulty dressing or bathing?: No Independently performs ADLs?: No Communication: Independent Dressing (OT): Independent Grooming: Independent Feeding: Independent Bathing: Independent Toileting: Independent In/Out Bed: Independent Walks in Home: Independent Does the patient have difficulty walking or climbing stairs?: No Weakness of Legs: None Weakness of Arms/Hands: None  Home Assistive Devices/Equipment Home Assistive Devices/Equipment: None    Abuse/Neglect Assessment (Assessment to be complete while patient is alone) Physical Abuse: Denies Verbal Abuse: Denies Sexual Abuse: Denies Exploitation of patient/patient's resources: Denies Self-Neglect: Denies Values / Beliefs Cultural Requests During Hospitalization: None Spiritual Requests During Hospitalization: None   Advance Directives (For Healthcare) Does patient have an advance directive?: No Would patient like information on creating an advanced directive?: No - patient declined information Nutrition Screen- [facility] Adult/[facility] Patient's home diet: Regular  Additional Information 1:1 In Past 12 Months?: No CIRT Risk: No Elopement Risk: No Does patient have medical clearance?: Yes     Disposition:  Disposition Initial Assessment Completed for this Encounter: Yes  On Site Evaluation by:   Reviewed with Physician:    Patient to be discharged home to follow up with [facility], per Dr. Darleene Cleaver and Waylan Boga, DNP.   Waldon Merl [facility] 03/19/2016 9:09 AM

## 2016-03-19 NOTE — BHH Suicide Risk Assessment (Signed)
Suicide Risk Assessment  Discharge Assessment   [facility] Discharge Suicide Risk Assessment   Principal Problem: Cocaine dependence with cocaine-induced mood disorder [facility]) Discharge Diagnoses:  Patient Active Problem List   Diagnosis Date Noted  . Cocaine dependence with cocaine-induced mood disorder [facility]) [F14.24] 03/19/2016    Priority: High    Total Time spent with patient: 45 minutes  Musculoskeletal: Strength & Muscle Tone: within normal limits Gait & Station: normal Patient leans: N/A  Psychiatric Specialty Exam: Physical Exam  Constitutional: He is oriented to person, place, and time. He appears well-developed and well-nourished.  HENT:  Head: Normocephalic.  Neck: Normal range of motion.  Respiratory: Effort normal.  GI: Soft.  Musculoskeletal: Normal range of motion.  Neurological: He is alert and oriented to person, place, and time.  Skin: Skin is warm and dry.  Psychiatric: He has a normal mood and affect. His speech is normal and behavior is normal. Judgment and thought content normal. Cognition and memory are normal.    Review of Systems  Constitutional: Negative.   HENT: Negative.   Eyes: Negative.   Respiratory: Negative.   Cardiovascular: Negative.   Gastrointestinal: Negative.   Genitourinary: Negative.   Musculoskeletal: Negative.   Skin: Negative.   Neurological: Negative.   Endo/Heme/Allergies: Negative.   Psychiatric/Behavioral: Positive for substance abuse.    Blood pressure 110/72, pulse 72, temperature 97.9 F (36.6 C), temperature source Oral, resp. rate 16, SpO2 100 %.There is no height or weight on file to calculate BMI.  General Appearance: Casual  Eye Contact:  Good  Speech:  Normal Rate  Volume:  Normal  Mood:  Euthymic  Affect:  Congruent  Thought Process:  Coherent  Orientation:  Full (Time, Place, and Person)  Thought Content:  WDL  Suicidal Thoughts:  No  Homicidal Thoughts:  No  Memory:  Immediate;   Good Recent;    Good Remote;   Good  Judgement:  Fair  Insight:  Fair  Psychomotor Activity:  Normal  Concentration:  Concentration: Good and Attention Span: Good  Recall:  Good  Fund of Knowledge:  Good  Language:  Good  Akathisia:  No  Handed:  Right  AIMS (if indicated):     Assets:  Leisure Time Physical Health Resilience Social Support  ADL's:  Intact  Cognition:  WNL  Sleep:      Mental Status Per Nursing Assessment::   On Admission:   cocaine abuse with hallucinations  Demographic Factors:  Male and Adolescent or young adult  Loss Factors: NA  Historical Factors: Impulsivity  Risk Reduction Factors:   Sense of responsibility to family, Living with another person, especially a relative, Positive social support and Positive therapeutic relationship  Continued Clinical Symptoms:  None  Cognitive Features That Contribute To Risk:  None    Suicide Risk:  Minimal: No identifiable suicidal ideation.  Patients presenting with no risk factors but with morbid ruminations; may be classified as minimal risk based on the severity of the depressive symptoms    Plan Of Care/Follow-up recommendations:  Activity:  as tolerated Diet:  heart healthy diet  Znya Albino, NP 03/19/2016, 11:26 AM

## 2016-03-19 NOTE — ED Notes (Signed)
Pt. Transferred to [facility] from ED to room 34. Report from [employer]. Pt. Oriented to unit including Q15 minute rounds as well as the security cameras for their protection. Patient is alert and oriented, warm and dry in no acute distress. Patient denies SI, HI, and AVH. Pt. Encouraged to let me know if needs arise.

## 2016-03-19 NOTE — BH Assessment (Signed)
0510:  Call and request to schedule tele-assessment, Nurse reports will need to find cart and also wake up Patient.  Nurse reports will call TTS when cart is found.

## 2016-03-19 NOTE — Progress Notes (Addendum)
Pt is asleep on his back with regular respirations.Pt asked the writer if the doctor would look at his snake bites on his feet. Reassured the pt that he would be okay. Pt is pleasant and cooperative. Pt stated he is ready to go home. Pt will be given a bus pass. 10am -Pt is napping. 11:15am -Spoke to [name] concerning obtaining a bus pass for the pt. Per [name] he has left a message with his supervisor that bus passes are needed in the [facility]. Spoke to [name] the clerk in the ER who stated she did not have any bus passes. Pt will be discharged once he receives a bus pass. Spoke with [name] to make sure the pts IVC paperwork has been rescended. (11:20am )

## 2016-06-02 ENCOUNTER — Encounter (HOSPITAL_COMMUNITY): Payer: Self-pay | Admitting: Emergency Medicine

## 2016-06-02 ENCOUNTER — Emergency Department (HOSPITAL_COMMUNITY)
Admission: EM | Admit: 2016-06-02 | Discharge: 2016-06-04 | Disposition: A | Discharge status: Other Acute Inpatient Hospital | Payer: Medicare Other | Attending: Emergency Medicine | Admitting: Emergency Medicine

## 2016-06-02 DIAGNOSIS — R443 Hallucinations, unspecified: Secondary | ICD-10-CM

## 2016-06-02 DIAGNOSIS — R440 Auditory hallucinations: Secondary | ICD-10-CM | POA: Diagnosis present

## 2016-06-02 DIAGNOSIS — R45850 Homicidal ideations: Secondary | ICD-10-CM | POA: Diagnosis not present

## 2016-06-02 DIAGNOSIS — R456 Violent behavior: Secondary | ICD-10-CM | POA: Diagnosis not present

## 2016-06-02 DIAGNOSIS — F1910 Other psychoactive substance abuse, uncomplicated: Secondary | ICD-10-CM | POA: Diagnosis not present

## 2016-06-02 DIAGNOSIS — R4689 Other symptoms and signs involving appearance and behavior: Secondary | ICD-10-CM

## 2016-06-02 DIAGNOSIS — F1424 Cocaine dependence with cocaine-induced mood disorder: Secondary | ICD-10-CM | POA: Diagnosis present

## 2016-06-02 DIAGNOSIS — F17210 Nicotine dependence, cigarettes, uncomplicated: Secondary | ICD-10-CM | POA: Insufficient documentation

## 2016-06-02 DIAGNOSIS — F918 Other conduct disorders: Secondary | ICD-10-CM | POA: Insufficient documentation

## 2016-06-02 DIAGNOSIS — F200 Paranoid schizophrenia: Secondary | ICD-10-CM | POA: Diagnosis present

## 2016-06-02 DIAGNOSIS — F29 Unspecified psychosis not due to a substance or known physiological condition: Secondary | ICD-10-CM | POA: Diagnosis not present

## 2016-06-02 DIAGNOSIS — R451 Restlessness and agitation: Secondary | ICD-10-CM | POA: Diagnosis not present

## 2016-06-02 LAB — CBC WITH DIFFERENTIAL/PLATELET
Basophils Absolute: 0.1 10*3/uL (ref 0.0–0.1)
Basophils Relative: 1 %
EOS ABS: 0.9 10*3/uL — AB (ref 0.0–0.7)
EOS PCT: 10 %
HCT: 41.7 % (ref 39.0–52.0)
Hemoglobin: 14.7 g/dL (ref 13.0–17.0)
LYMPHS ABS: 2.5 10*3/uL (ref 0.7–4.0)
LYMPHS PCT: 27 %
MCH: 28.9 pg (ref 26.0–34.0)
MCHC: 35.3 g/dL (ref 30.0–36.0)
MCV: 82.1 fL (ref 78.0–100.0)
MONO ABS: 1.4 10*3/uL — AB (ref 0.1–1.0)
MONOS PCT: 16 %
Neutro Abs: 4.2 10*3/uL (ref 1.7–7.7)
Neutrophils Relative %: 46 %
PLATELETS: 242 10*3/uL (ref 150–400)
RBC: 5.08 MIL/uL (ref 4.22–5.81)
RDW: 14.2 % (ref 11.5–15.5)
WBC: 9 10*3/uL (ref 4.0–10.5)

## 2016-06-02 LAB — I-STAT CHEM 8, ED
BUN: 20 mg/dL (ref 6–20)
CALCIUM ION: 1.05 mmol/L — AB (ref 1.13–1.30)
CHLORIDE: 101 mmol/L (ref 101–111)
Creatinine, Ser: 0.8 mg/dL (ref 0.61–1.24)
GLUCOSE: 124 mg/dL — AB (ref 65–99)
HCT: 44 % (ref 39.0–52.0)
HEMOGLOBIN: 15 g/dL (ref 13.0–17.0)
Potassium: 3.5 mmol/L (ref 3.5–5.1)
SODIUM: 141 mmol/L (ref 135–145)
TCO2: 29 mmol/L (ref 0–100)

## 2016-06-02 MED ORDER — OLANZAPINE 10 MG PO TABS
20.0000 mg | ORAL_TABLET | Freq: Every day | ORAL | Status: DC
  Administered 2016-06-03: 20 mg via ORAL
  Filled 2016-06-02 (×2): qty 2

## 2016-06-02 NOTE — ED Notes (Signed)
Bed: PQ33 Expected date:  Expected time:  Means of arrival:  Comments: From [facility] by [facility]
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
## 2016-06-02 NOTE — ED Notes (Signed)
Pt has refused Zyprexa.  Beverly Sessions was called and inquired if pt took medication prior to coming here.  Per Beverly Sessions, pt was given Zyprexa at [facility].
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
## 2016-06-03 ENCOUNTER — Encounter (HOSPITAL_COMMUNITY): Payer: Self-pay

## 2016-06-03 DIAGNOSIS — F200 Paranoid schizophrenia: Secondary | ICD-10-CM | POA: Diagnosis not present

## 2016-06-03 DIAGNOSIS — R45850 Homicidal ideations: Secondary | ICD-10-CM | POA: Diagnosis not present

## 2016-06-03 DIAGNOSIS — F17210 Nicotine dependence, cigarettes, uncomplicated: Secondary | ICD-10-CM

## 2016-06-03 DIAGNOSIS — F1424 Cocaine dependence with cocaine-induced mood disorder: Secondary | ICD-10-CM | POA: Diagnosis not present

## 2016-06-03 LAB — RAPID URINE DRUG SCREEN, HOSP PERFORMED
Amphetamines: NOT DETECTED
BARBITURATES: NOT DETECTED
Benzodiazepines: NOT DETECTED
COCAINE: POSITIVE — AB
Opiates: NOT DETECTED
Tetrahydrocannabinol: POSITIVE — AB

## 2016-06-03 LAB — ETHANOL: Alcohol, Ethyl (B): <5 mg/dL (ref <5)

## 2016-06-03 NOTE — ED Notes (Signed)
Patient gave his mother permission to take his EBT card home. EBT card given to the patient's mother.

## 2016-06-03 NOTE — ED Notes (Signed)
Patient ambulated to the bathroom.

## 2016-06-03 NOTE — ED Notes (Signed)
TTS consult via telepsych in process at this time.

## 2016-06-03 NOTE — ED Notes (Signed)
Bed: [facility] Expected date:  Expected time:  Means of arrival:  Comments: Room 10
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
## 2016-06-03 NOTE — ED Notes (Signed)
Psych team with patient.
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
## 2016-06-03 NOTE — ED Notes (Signed)
Patient noted sleeping in room. No complaints, stable, in no acute distress. Q15 minute rounds and monitoring via [employer] to continue.

## 2016-06-03 NOTE — ED Notes (Signed)
Pt belongings moved to [facility]

## 2016-06-03 NOTE — ED Notes (Signed)
Received a call from TTS---- pt has met in-patient care criteria and placement being processed.
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
## 2016-06-03 NOTE — ED Notes (Signed)
Patient noted in room. No complaints, stable, in no acute distress. Q15 minute rounds and monitoring via [employer] to continue.

## 2016-06-03 NOTE — ED Notes (Signed)
Pt. Transferred to [facility] from ED to room 35. Report to include Situation, Background, Assessment and Recommendations from Autumn RN. Pt. Oriented to unit including Q15 minute rounds as well as the security cameras for their protection. Patient is alert and oriented, warm and dry in no acute distress. Patient denies SI, HI, and AVH. Pt. Encouraged to let me know if needs arise.

## 2016-06-04 ENCOUNTER — Encounter (HOSPITAL_COMMUNITY): Payer: Self-pay | Admitting: *Deleted

## 2016-06-04 ENCOUNTER — Inpatient Hospital Stay (HOSPITAL_COMMUNITY)
Admission: AD | Admit: 2016-06-04 | Discharge: 2016-06-12 | DRG: 885 | Disposition: A | Discharge status: Home / Self Care | Payer: Medicare Other | Attending: Psychiatry | Admitting: Psychiatry

## 2016-06-04 DIAGNOSIS — F1424 Cocaine dependence with cocaine-induced mood disorder: Secondary | ICD-10-CM | POA: Diagnosis present

## 2016-06-04 DIAGNOSIS — F1910 Other psychoactive substance abuse, uncomplicated: Secondary | ICD-10-CM

## 2016-06-04 DIAGNOSIS — F1220 Cannabis dependence, uncomplicated: Secondary | ICD-10-CM | POA: Diagnosis present

## 2016-06-04 DIAGNOSIS — D571 Sickle-cell disease without crisis: Secondary | ICD-10-CM | POA: Diagnosis present

## 2016-06-04 DIAGNOSIS — F200 Paranoid schizophrenia: Principal | ICD-10-CM | POA: Diagnosis present

## 2016-06-04 DIAGNOSIS — F918 Other conduct disorders: Secondary | ICD-10-CM | POA: Diagnosis not present

## 2016-06-04 DIAGNOSIS — C9000 Multiple myeloma not having achieved remission: Secondary | ICD-10-CM | POA: Diagnosis present

## 2016-06-04 DIAGNOSIS — F29 Unspecified psychosis not due to a substance or known physiological condition: Secondary | ICD-10-CM

## 2016-06-04 DIAGNOSIS — R451 Restlessness and agitation: Secondary | ICD-10-CM

## 2016-06-04 DIAGNOSIS — Z915 Personal history of self-harm: Secondary | ICD-10-CM | POA: Diagnosis not present

## 2016-06-04 DIAGNOSIS — F411 Generalized anxiety disorder: Secondary | ICD-10-CM | POA: Diagnosis present

## 2016-06-04 DIAGNOSIS — Z800 Family history of malignant neoplasm of digestive organs: Secondary | ICD-10-CM | POA: Diagnosis not present

## 2016-06-04 DIAGNOSIS — F17210 Nicotine dependence, cigarettes, uncomplicated: Secondary | ICD-10-CM | POA: Diagnosis present

## 2016-06-04 DIAGNOSIS — R45850 Homicidal ideations: Secondary | ICD-10-CM | POA: Diagnosis not present

## 2016-06-04 DIAGNOSIS — F209 Schizophrenia, unspecified: Secondary | ICD-10-CM | POA: Diagnosis not present

## 2016-06-04 DIAGNOSIS — F1420 Cocaine dependence, uncomplicated: Secondary | ICD-10-CM | POA: Clinically undetermined

## 2016-06-04 MED ORDER — MAGNESIUM HYDROXIDE 400 MG/5ML PO SUSP: 30.0000 mL | Freq: Every day | ORAL | Status: DC | PRN

## 2016-06-04 MED ORDER — OLANZAPINE 10 MG PO TABS
20.0000 mg | ORAL_TABLET | Freq: Every day | ORAL | Status: DC
  Administered 2016-06-04 – 2016-06-11 (×8): 20 mg via ORAL
  Filled 2016-06-04 (×11): qty 2

## 2016-06-04 MED ORDER — ACETAMINOPHEN 325 MG PO TABS
650.0000 mg | ORAL_TABLET | Freq: Four times a day (QID) | ORAL | Status: DC | PRN
  Administered 2016-06-04 – 2016-06-11 (×12): 650 mg via ORAL
  Filled 2016-06-04 (×12): qty 2

## 2016-06-04 MED ORDER — ALUM & MAG HYDROXIDE-SIMETH 200-200-20 MG/5ML PO SUSP: 30.0000 mL | ORAL | Status: DC | PRN

## 2016-06-04 NOTE — ED Notes (Signed)
Patient noted sleeping in room. No complaints, stable, in no acute distress. Q15 minute rounds and monitoring via [employer] to continue.

## 2016-06-04 NOTE — Progress Notes (Signed)
Patient admitted to [facility] hall.  Patient denied SI and HI, contracts for safety.  Denied A/V hallucinations.  Denied pain.  Patient stated he needed to come here to get away from problems for awhile.  Patient went to dinner in dining room.  Talked on phone after returning from dinner.  Respirations even and unlabored.  No signs/symptoms of pain/distress noted on patient's face/body movements.
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
## 2016-06-04 NOTE — ED Notes (Signed)
Double portions for meals VORB May NP

## 2016-06-04 NOTE — ED Notes (Signed)
Up tot he bathroom to shower and change scrubs

## 2016-06-04 NOTE — ED Notes (Addendum)
Patients mother Sunday Spillers) called and reports that the patient will not take his medications on a regular basis and behaviors come and go.  She reports that she feels that he needs long term placement and is afraid that he will hurt his self, or someone will hurt him. She also reports that he has been in the systems since he was a young teenager and "knows what to say."  Contact number--[phone].

## 2016-06-04 NOTE — ED Notes (Signed)
Up to the bathroom 
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
## 2016-06-04 NOTE — Progress Notes (Signed)
Patient ID: [name], male   DOB: 01-10-1981, 35 y.o.   MRN: [id number] Per State regulations 482.30 this chart was reviewed for medical necessity with respect to the patient's admission/duration of stay.    Next review date: 06/08/16  Debarah Crape, BSN, RN-BC  Case Manager

## 2016-06-04 NOTE — ED Notes (Addendum)
[employer] here to transport, IVC papers, EMtela, belongings, POA papers and [place] papers given to [employer].  Pt ambulatory w/o difficulty w/ officers to [facility].

## 2016-06-04 NOTE — ED Notes (Signed)
[employer] contacted for transport
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
## 2016-06-04 NOTE — Progress Notes (Signed)
Adult Psychoeducational Group Note  Date:  06/04/2016 Time:  9:04 PM  Group Topic/Focus:  Wrap-Up Group:   The focus of this group is to help patients review their daily goal of treatment and discuss progress on daily workbooks.   Participation Level:  Active  Participation Quality:  Appropriate and Attentive  Affect:  Appropriate  Cognitive:  Appropriate  Insight: Appropriate  Engagement in Group:  Engaged  Modes of Intervention:  Discussion  Additional Comments:  Pt stated his goal for today was to give back to the community, pt still working on this goal. Pt was encouraged to make his needs known to staff.   Clint Bolder 06/04/2016, 9:04 PM

## 2016-06-05 ENCOUNTER — Other Ambulatory Visit: Payer: Self-pay

## 2016-06-05 MED ORDER — OLANZAPINE 5 MG PO TBDP
5.0000 mg | ORAL_TABLET | Freq: Three times a day (TID) | ORAL | Status: DC | PRN
  Administered 2016-06-08: 5 mg via ORAL
  Filled 2016-06-05 (×2): qty 1

## 2016-06-05 NOTE — BHH Group Notes (Signed)
[facility] LCSW Group Therapy  06/05/2016 1:15 pm  Type of Therapy: Process Group Therapy  Participation Level:  Active  Participation Quality:  Appropriate  Affect:  Flat  Cognitive:  Oriented  Insight:  Improving  Engagement in Group:  Limited  Engagement in Therapy:  Limited  Modes of Intervention:  Activity, Clarification, Education, Problem-solving and Support  Summary of Progress/Problems: Today's group addressed the issue of overcoming obstacles.  Patients were asked to identify their biggest obstacle post d/c that stands in the way of their on-going success, and then problem solve as to how to manage this. Stayed the entire time, engaged throughout.  Either somewhat disorganized, or limited intellectually, or both.  "I need to hang with my mom and go get ice cream instead of calling the drug man." When I clarified, saying that it sounded like drugs are an obstacle, he got agitated.  "Didn't you hear me?  I said it was the drug man!"  Later, when another patient was asking for resources related to medical issues, he offered AA and NA.  Trish Mage 06/05/2016   2:57 PM

## 2016-06-05 NOTE — Progress Notes (Signed)
D:Pt reports that he is in the hospital to work on his anger. Pt's mood is labile and paranoid as reports that there is a "[place] girl following me" on the unit. He verbally denies hallucinations. Pt allowed writer to take an EKG. Gave results to MD. A:Offered support, encouragement and 15 minute checks. R:Pt denies si and hi. Safety maintained on the unit.

## 2016-06-05 NOTE — BHH Suicide Risk Assessment (Signed)
[facility] Admission Suicide Risk Assessment   Nursing information obtained from:  Patient Demographic factors:  Male Current Mental Status:  NA Loss Factors:  NA Historical Factors:  NA Risk Reduction Factors:  Sense of responsibility to family  Total Time spent with patient: 30 minutes Principal Problem: Schizophrenia ([place]) Diagnosis:   Patient Active Problem List   Diagnosis Date Noted  . Cocaine dependence with cocaine-induced mood disorder ([place]) [F14.24] 03/19/2016  . Cocaine-induced mood disorder ([place]) [F14.94] 03/03/2016  . Cocaine abuse [F14.10] 03/03/2016  . Suicidal ideation [R45.851]   . Reflux esophagitis [K21.0] 05/24/2013  . Other specified gastritis without mention of hemorrhage [K29.60] 05/24/2013  . Nausea with vomiting [R11.2] 05/23/2013  . Anemia, unspecified [D64.9] 05/23/2013  . Nonspecific abnormal finding in stool contents [R19.5] 05/23/2013  . Schizophrenia ([place]) [F20.9]   . Ulcerative colitis ([place]) [K51.90]   . SUBSTANCE ABUSE, MULTIPLE [F19.10] 12/31/2007   Subjective Data:  He states that he feels "fair." When he is asked about his violent behavior, he states he does not remember it. He states he sees a provider in [place] last week. Although he states he was in jail, he declines to talk further stating that it was "past." He asks how old this Probation officer is and declines to be asked any more questions. He would like [place] to contact his mother, Ms. Ina Homes.   He denies SI/HI/AH/VH. He reports using cocaine and cannabinoids, but reports no desire to stop it as he is an "adult."  Continued Clinical Symptoms:  Alcohol Use Disorder Identification Test Final Score (AUDIT): 0 The "Alcohol Use Disorders Identification Test", Guidelines for Use in Primary Care, Second Edition.  World Pharmacologist [facility]). Score between 0-7:  no or low risk or alcohol related problems. Score between 8-15:  moderate risk of alcohol related problems. Score between 16-19:  high risk  of alcohol related problems. Score 20 or above:  warrants further diagnostic evaluation for alcohol dependence and treatment.   CLINICAL FACTORS:   Alcohol/Substance Abuse/Dependencies   Musculoskeletal: Strength & Muscle Tone: within normal limits Gait & Station: normal Patient leans: N/A  Psychiatric Specialty Exam: Physical Exam  Review of Systems  Unable to perform ROS: Mental acuity    Blood pressure 90/62, pulse 79, temperature 97.8 F (36.6 C), temperature source Oral, resp. rate 18, height 6' (1.829 m), weight 184 lb (83.5 kg).Body mass index is 24.95 kg/m.  General Appearance: Casual  Eye Contact:  intense  Speech:  Clear and Coherent  Volume:  Normal  Mood:  "fair"  Affect:  irritable, hostile  Thought Process:  Coherent and Goal Directed   Orientation:  Full (Time, Place, and Person)  Thought Content:  Logical  Suicidal Thoughts:  No  Homicidal Thoughts:  No  Memory:  NA  Judgement:  Impaired  Insight:  Lacking  Psychomotor Activity:  Increased  Concentration:  Concentration: Fair and Attention Span: Fair  Recall:  [employer] of Knowledge:  Fair  Language:  Fair  Akathisia:  No  Handed:  Right  AIMS (if indicated):     Assets:  Social Support  ADL's:  Intact  Cognition:  WNL  Sleep:  Number of Hours: 6      COGNITIVE FEATURES THAT CONTRIBUTE TO RISK:  Closed-mindedness and Polarized thinking    SUICIDE RISK:   Mild:  Suicidal ideation of limited frequency, intensity, duration, and specificity.  There are no identifiable plans, no associated intent, mild dysphoria and related symptoms, good self-control (both objective and subjective  assessment), few other risk factors, and identifiable protective factors, including available and accessible social support.   PLAN OF CARE: Patient will be admitted to inpatient psychiatric unit for stabilization and safety. Will provide and encourage milieu participation. Provide medication management and maked  adjustments as needed.  Will follow daily.   I certify that inpatient services furnished can reasonably be expected to improve the patient's condition.  Norman Clay, MD 06/05/2016, 12:30 PM
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
## 2016-06-05 NOTE — Progress Notes (Signed)
Adult Psychoeducational Group Note  Date:  06/05/2016 Time:  8:15 PM  Group Topic/Focus:  Wrap-Up Group:   The focus of this group is to help patients review their daily goal of treatment and discuss progress on daily workbooks.   Participation Level:  Did Not Attend  Pt did not attend wrap-up group. Pt was asleep.    Lincoln Brigham 06/05/2016, 8:37 PM
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
## 2016-06-05 NOTE — Plan of Care (Signed)
Problem: Education: Goal: Knowledge of the prescribed therapeutic regimen will improve Outcome: Progressing Pt was able to tell writer what medication and dosage he was taking tonight.

## 2016-06-05 NOTE — Plan of Care (Signed)
Problem: Activity: Goal: Sleeping patterns will improve Outcome: Progressing Pt reports that he slept good last night.  Problem: Coping: Goal: Ability to verbalize frustrations and anger appropriately will improve Outcome: Progressing Pt verbalizes that he wants to work on his anger.

## 2016-06-05 NOTE — Progress Notes (Signed)
Recreation Therapy Notes  Date: 06/05/16 Time: 1030 Location: 500 Hall Dayroom  Group Topic: Wellness  Goal Area(s) Addresses:  Patient will define components of whole wellness. Patient will verbalize benefit of whole wellness.  Behavioral Response: Engaged  Intervention: Dry Erase Board, Dry Erase Marker  Activity: Wellness Pictionary. LRT put various activities on paper. LRT folded the activities and put them into a cup. Patients were to pull a piece of paper from the cup and draw their activity on the board. The remaining patients were to guess the activity. The first patient to guess the activity, would get the next turn.  Education: Wellness, Dentist.   Education Outcome: Acknowledges education/In group clarification offered/Needs additional education.   Clinical Observations/Feedback:  Pt was flat but engaged.  Pt was very active in group and engaged well with his peers.      Victorino Sparrow, LRT/CTRS   Victorino Sparrow A 06/05/2016 12:37 PM

## 2016-06-05 NOTE — Progress Notes (Signed)
D: Pt was in his room upon initial approach.  Pt has depressed affect and mood.  He forwards little information to Probation officer.  Pt reports his goal is to "stay focused."  Reports he has been able to do this.  Pt denies SI/HI, denies hallucinations, reports pain from headache of 6/10.  Pt has been in his room for the majority of the night.  Pt did not attend evening group.  He came out of his room for snack and watched TV for a short amount of time.  Pt has been cooperative with staff tonight. A: Introduced self to pt.  Met with pt and offered support and encouragement.  Medication administered per order.  Medication education provided. R: Pt is compliant with medication.  Pt verbally contracts for safety.  Will continue to monitor and assess.
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
## 2016-06-06 LAB — LIPID PANEL
CHOL/HDL RATIO: 3.2 ratio
Cholesterol: 171 mg/dL (ref 0–200)
HDL: 53 mg/dL (ref >40)
LDL Cholesterol: 60 mg/dL (ref 0–99)
Triglycerides: 292 mg/dL — ABNORMAL HIGH (ref <150)
VLDL: 58 mg/dL — ABNORMAL HIGH (ref 0–40)

## 2016-06-06 LAB — TSH: TSH: 1.69 u[IU]/mL (ref 0.350–4.500)
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
## 2016-06-06 NOTE — Progress Notes (Signed)
D:Pt's mood is labile and irritable at times. He c/o a headache this morning and was given prn tylenol. Pt has been out in the dayroom participating in groups. A:Supported pt to discuss feelings. Offered encouragement and 15 minute checks. R:Pt denies si and hi. He denies hallucinations. Safety maintained on the unit.

## 2016-06-06 NOTE — Progress Notes (Signed)
Recreation Therapy Notes  Date: 06/06/16 Time: 1000 Location: 500 Hall Dayroom  Group Topic: Self-Esteem  Goal Area(s) Addresses:  Patient will identify positive ways to increase self-esteem. Patient will verbalize benefit of increased self-esteem.  Behavioral Response: Engaged  Intervention: Visual merchandiser, colored pencils, crayons  Activity: Personalized Liscence Plate.  LRT talked to patients about self-esteem.  Patients were then to draw a personalized liscence plate describing the positive things that make them unique.  Patients are to also include things they consider dear to them.      Education:  Self-Esteem, Dentist.   Education Outcome: Acknowledges education/In group clarification offered/Needs additional education  Clinical Observations/Feedback:  Pt explained that self-esteem is "how you feel about yourself".  "If you love yourself, you want to live and do good but if you don't you want to kill yourself".  Pt stated the activity was easy for him because he knows his worth.    Victorino Sparrow, LRT/CTRS   Victorino Sparrow A 06/06/2016 12:24 PM

## 2016-06-06 NOTE — Progress Notes (Signed)
[facility] MD Progress Note  06/06/2016 1:42 PM CAMERAN PETTEY  MRN:  [id number] Subjective:   Patient seen, chart reviewed and case discussed with nursing staff.  - Patient has been labile and irritable.   He states that he feels fine today. He agrees that he is a little calmer today. He also states that he does not think about the past and move forward. He declines the option of injection, and states he has made up his mind and request this interviewer to shut the mouth. He denies SI/HI. He denies AH/VH.   Principal Problem: Schizophrenia ([place]) Diagnosis:   Patient Active Problem List   Diagnosis Date Noted  . Cocaine dependence with cocaine-induced mood disorder ([place]) [F14.24] 03/19/2016  . Cocaine-induced mood disorder ([place]) [F14.94] 03/03/2016  . Cocaine abuse [F14.10] 03/03/2016  . Suicidal ideation [R45.851]   . Reflux esophagitis [K21.0] 05/24/2013  . Other specified gastritis without mention of hemorrhage [K29.60] 05/24/2013  . Nausea with vomiting [R11.2] 05/23/2013  . Anemia, unspecified [D64.9] 05/23/2013  . Nonspecific abnormal finding in stool contents [R19.5] 05/23/2013  . Schizophrenia ([place]) [F20.9]   . Ulcerative colitis ([place]) [K51.90]   . 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 Crohn's disease ([place])   . GI (gastrointestinal bleed) 04/2013  . Multiple myeloma ([place])   . Schizophrenia ([place])   . Sickle cell anemia ([facility])   . Suicide attempt by drug ingestion ([place])   . Ulcerative colitis     Past Surgical History:  Procedure Laterality Date  . ESOPHAGOGASTRODUODENOSCOPY N/A 05/24/2013   Procedure: ESOPHAGOGASTRODUODENOSCOPY (EGD);  Surgeon: Ladene Artist, MD;  Location: [facility] ENDOSCOPY;  Service: Endoscopy;  Laterality: N/A;  . FLEXIBLE SIGMOIDOSCOPY  12/05/2011   Procedure: FLEXIBLE SIGMOIDOSCOPY;  Surgeon: Beryle Beams, MD;  Location: [facility] ENDOSCOPY;  Service: Endoscopy;  Laterality: N/A;  . MANDIBLE FRACTURE SURGERY     Family History:  Family History  Problem Relation Age of Onset  . Liver cancer     Family Psychiatric  History: see HPI Social History:  History  Alcohol Use  . Yes    Comment: quit     History  Drug Use  . Types: Cocaine, Marijuana    Comment: quit 1 mo ago    Social History   Social History  . Marital status: Single    Spouse name: N/A  . Number of children: N/A  . Years of education: GED   Occupational History  . disabled    Social History Main Topics  . Smoking status: Current Every Day Smoker    Packs/day: 0.25    Years: 20.00    Types: Cigarettes  . Smokeless tobacco: Never Used  . Alcohol use Yes     Comment: quit  . Drug use:     Types: Cocaine, Marijuana     Comment: quit 1 mo ago  . Sexual activity: No   Other Topics Concern  . None   Social History Narrative   Mr. Haisley was recently released from prison, currently living at home with his mother.  He is disabled (due to mental illness?).    Additional Social History:    Pain Medications: none Prescriptions: none Over the Counter: none History of alcohol / drug use?: No history of alcohol / drug abuse  Sleep: Good  Appetite:  Good  Current Medications: Current Facility-Administered Medications  Medication Dose Route Frequency Provider Last Rate Last Dose  . acetaminophen (TYLENOL) tablet 650 mg  650 mg Oral Q6H PRN Kerrie Buffalo, NP   650 mg at 06/06/16 0815  . alum & mag hydroxide-simeth (MAALOX/MYLANTA) 200-200-20 MG/5ML suspension 30 mL  30 mL Oral Q4H PRN Kerrie Buffalo, NP      . magnesium hydroxide (MILK OF MAGNESIA) suspension 30 mL  30 mL Oral Daily PRN Kerrie Buffalo, NP      . OLANZapine (ZYPREXA) tablet 20 mg  20 mg Oral QHS Kerrie Buffalo, NP   20 mg at 06/05/16 2105  . OLANZapine zydis (ZYPREXA) disintegrating tablet 5 mg  5 mg Oral Q8H PRN  Norman Clay, MD       Facility-Administered Medications Ordered in Other Encounters  Medication Dose Route Frequency Provider Last Rate Last Dose  . hydrOXYzine (ATARAX/VISTARIL) tablet 50 mg  50 mg Oral TID PRN Encarnacion Slates, NP      . magnesium hydroxide (MILK OF MAGNESIA) suspension 30 mL  30 mL Oral Daily PRN Encarnacion Slates, NP        Lab Results:  Results for orders placed or performed during the hospital encounter of 06/04/16 (from the past 48 hour(s))  TSH     Status: None   Collection Time: 06/06/16  6:59 AM  Result Value Ref Range   TSH 1.690 0.350 - 4.500 uIU/mL    Comment: Performed at [facility]  Lipid panel     Status: Abnormal   Collection Time: 06/06/16  6:59 AM  Result Value Ref Range   Cholesterol 171 0 - 200 mg/dL   Triglycerides 292 (H) <150 mg/dL   HDL 53 >40 mg/dL   Total CHOL/HDL Ratio 3.2 RATIO   VLDL 58 (H) 0 - 40 mg/dL   LDL Cholesterol 60 0 - 99 mg/dL    Comment:        Total Cholesterol/HDL:CHD Risk Coronary Heart Disease Risk Table                     Men   Women  1/2 Average Risk   3.4   3.3  Average Risk       5.0   4.4  2 X Average Risk   9.6   7.1  3 X Average Risk  23.4   11.0        Use the calculated Patient Ratio above and the CHD Risk Table to determine the patient's CHD Risk.        ATP III CLASSIFICATION (LDL):  <100     mg/dL   Optimal  100-129  mg/dL   Near or Above                    Optimal  130-159  mg/dL   Borderline  160-189  mg/dL   High  >190     mg/dL   Very High Performed at [facility]     Blood Alcohol level:  Lab Results  Component Value Date   [facility] <5 06/02/2016   ETH <5 68/12/2120    Metabolic Disorder Labs: Lab Results  Component Value Date   HGBA1C 5.9 (H) 12/13/2011   MPG 123 (H) 12/13/2011   MPG 126 12/17/2009   No results found for: PROLACTIN Lab Results  Component Value Date   CHOL 171 06/06/2016   TRIG 292 (H) 06/06/2016  HDL 53 06/06/2016   CHOLHDL 3.2  06/06/2016   VLDL 58 (H) 06/06/2016   LDLCALC 60 06/06/2016   LDLCALC  12/17/2009    59        Total Cholesterol/HDL:CHD Risk Coronary Heart Disease Risk Table                     Men   Women  1/2 Average Risk   3.4   3.3  Average Risk       5.0   4.4  2 X Average Risk   9.6   7.1  3 X Average Risk  23.4   11.0        Use the calculated Patient Ratio above and the CHD Risk Table to determine the patient's CHD Risk.        ATP III CLASSIFICATION (LDL):  <100     mg/dL   Optimal  100-129  mg/dL   Near or Above                    Optimal  130-159  mg/dL   Borderline  160-189  mg/dL   High  >190     mg/dL   Very High    Physical Findings: AIMS:  , ,  ,  ,    CIWA:    COWS:     Musculoskeletal: Strength & Muscle Tone: within normal limits Gait & Station: normal Patient leans: N/A  Psychiatric Specialty Exam: Physical Exam  Review of Systems  Psychiatric/Behavioral: Positive for substance abuse. Negative for depression, hallucinations and suicidal ideas. The patient is not nervous/anxious and does not have insomnia.   All other systems reviewed and are negative.   Blood pressure 127/77, pulse (!) 101, temperature 98.2 F (36.8 C), temperature source Oral, resp. rate 20, height 6' (1.829 m), weight 184 lb (83.5 kg).Body mass index is 24.95 kg/m.  General Appearance: Casual  Eye Contact:  less intense  Speech:  Normal Rate  Volume:  Normal  Mood:  good  Affect:  Constricted and irritable  Thought Process:  Goal Directed  Orientation:  Full (Time, Place, and Person)  Thought Content:  linear, not forthcoming   Suicidal Thoughts:  No  Homicidal Thoughts:  No  Memory:  NA  Judgement:  Impaired  Insight:  Lacking  Psychomotor Activity:  Increased  Concentration:  Concentration: Fair and Attention Span: Fair  Recall:  [employer] of Knowledge:  Fair  Language:  Fair  Akathisia:  No  Handed:  Ambidextrous  AIMS (if indicated):     Assets:  Social Support  ADL's:   Intact  Cognition:  WNL  Sleep:  Number of Hours: 6.25   Assessment Patric Buckhalter Russellis an 35 y.o.malewith history of schizophrenia, cocaine use, marijuana use who presented to [facility] emergency department after petitioned by his mother and was allso referred from [facility] with HI towards his mother and brother, hallucinations and aggressive behaviors including punching television in the setting of non adherence to medication and cocaine/marijaua use. He was in jail from July 4 until the day before presented to ED for assaulting an officer and possession of drugs. He was IVC'd given risk of danger to others.  # Schizophrenia # r/o substance induced psychotic/mood disorder He continues to have hostile attitude and marked irritability, although it appears to less compared to yesterday.  Will continue current medication. Noted that he reports no interest in getting depot injection; did not allow further conversation. Will  continue to inquire this possibility given his history of non adherence to medication.   # Cocaine use disorder # THC use disorder Patient is precomtemplative stage for abstinent from drugs. Will continue motivational interview.   Plans - Continue Zyprexa 20 mg qhs - Continue Zyprexa 5 mg q8hprn for agitation - Reviewed TSH wnl, lipid panels- increased TG - Pending prolactin - Will plan to recheck EKG in a few days for QTc prolongation- QTC 474 msec on 8/21.  - Admit for crisis management and stabilization. - Medication management to reduce current symptoms to base line and improve the patient's overall level of functioning. - Monitor for the adverse effect of the medications and anger outbursts - Continue 15 minutes observation for safety concerns - Encouraged to participate in milieu therapy and group therapy counseling sessions and also work with coping skills -  Develop treatment plan to decrease risk of relapse upon discharge and to reduce the need for  readmission. -  Psycho-social education regarding relapse prevention and self care. - Health care follow up as needed for medical problems. - Restart home medications where appropriate.  Treatment Plan Summary: Daily contact with patient to assess and evaluate symptoms and progress in treatment  Norman Clay, MD 06/06/2016, 1:42 PM

## 2016-06-06 NOTE — Progress Notes (Signed)
Lab results reported to MD. No new orders at this time. Pt is sitting in the dayroom watching TV. Pt looks around often and has been observed talking with no one around. He continues to verbally deny hallucinations.

## 2016-06-06 NOTE — Progress Notes (Signed)
Pt is loud, cursing and saying "crack heads and drug addicts yo" while talking on the phone. Pt's mood continues to be labile and easily agitated.

## 2016-06-06 NOTE — Progress Notes (Signed)
Did not attend group 

## 2016-06-06 NOTE — [facility] Group Notes (Signed)
[facility] LCSW Group Therapy  06/06/2016 , 3:59 PM   Type of Therapy:  Group Therapy  Participation Level:  Active  Participation Quality:  Attentive  Affect:  Appropriate  Cognitive:  Alert  Insight:  Improving  Engagement in Therapy:  Engaged  Modes of Intervention:  Discussion, Exploration and Socialization  Summary of Progress/Problems: Today's group focused on the term Diagnosis.  Participants were asked to define the term, and then pronounce whether it is a negative, positive or neutral term. Started off bad.  He wanted to know if I called his mother, and what she said.  I offered to tell him after group, but he demanded to know immediately.  He was hoping she said she wanted him home right away, and when that was not the message I delivered, he got angry and went to the phone to call her immediately.  Could not be dissuaded.  Began yelling at her when he did not hear what he wanted from her.  Hung up, but instead of leaving, stayed.  Sat quietly for first half, but then decided to contribute.  Although I could not understand a lot of what he was saying due to poor diction, he seemed happy that I did not cut him off. Imbedded in his speech was references to peers who are drug abusers and have a death wish, people who put him down due to having a diagnosis, and people who try to tell him what to do, like his mother who reminds him to take his meds.  By the end, others were laughing at a joke, he was calm and relaxed, and actually appeared to be enjoying himself.  Roque Lias B 06/06/2016 , 3:59 PM

## 2016-06-07 LAB — HEMOGLOBIN A1C
Hgb A1c MFr Bld: 5.5 % (ref 4.8–5.6)
MEAN PLASMA GLUCOSE: 111 mg/dL

## 2016-06-07 LAB — PROLACTIN: Prolactin: 31.9 ng/mL — ABNORMAL HIGH (ref 4.0–15.2)

## 2016-06-07 NOTE — Progress Notes (Signed)
Patient ID: JEMIAH ELLENBURG, male   DOB: [birth year], 35 y.o.   MRN: [id number]   Pt currently presents with a masked affect and impulsive, labile behavior. Pt gives a thumb up to writer when asked if he had a good day." Pt blurts out inappropriate comments during interaction. Pt behavior cooperative. Pt reports good sleep with current medication regimen. Pt communicates vague threats to peers including, "if they don't stop looking at me..." Pt yells while on the phone.   Pt provided with medications per providers orders. Pt's labs and vitals were monitored throughout the night. Pt supported emotionally and encouraged to express concerns and questions. Pt educated on medications.  Pt's safety ensured with 15 minute and environmental checks. Pt currently denies SI/HI and A/V hallucinations. Pt verbally agrees to seek staff if SI/HI or A/VH occurs and to consult with staff before acting on any harmful thoughts. Will continue POC.

## 2016-06-07 NOTE — B[facility] Group Notes (Signed)
[facility] [employer] Group Therapy  06/07/2016 , 1:32 PM    Type of Therapy:  Mental Health Association Presentation  Participation Level:  Active  Participation Quality:  Attentive  Affect:  Blunted  Cognitive:  Oriented  Insight:  Limited  Engagement in Therapy:  Engaged  Modes of Intervention:  Discussion, Education and Socialization  Summary of Progress/Problems:  Shanon Brow from [facility] came to present his recovery story and play the guitar.  In and out several times.  Intrusive.  Difficult to redirect.  Difficult to tell if he was deliberately provocative or trying to be funny.  Roque Lias B 06/07/2016 , 1:32 PM

## 2016-06-07 NOTE — Progress Notes (Signed)
D:  Patient's self inventory sheet, patient sleeps good, no sleep medication given.  Good appetite, normal energy level, good concentration.  Denied depression, hopeless and anxiety.  Denied withdrawals.  Denied physical problems.  Denied pain.  Goal is to stay focused.  Plans to stay positive.  Does have discharge plan. A:  Medications administered per MD orders.  Emotional support and encouragement given patient. R:  Denied SI and HI.  Contracts for safety.  Denied A/V hallucinations.  Safety maintained with 15 minute checks.

## 2016-06-07 NOTE — Plan of Care (Signed)
Problem: Safety: Goal: Ability to demonstrate self-control will improve Outcome: Not Progressing Pt has been agitated this evening and he threw coffee all over the day room.

## 2016-06-07 NOTE — Progress Notes (Signed)
Patient ID: BURLON CENTRELLA, male   DOB: 11-27-1980, 35 y.o.   MRN: [id number] [facility] MD Progress Note  06/07/2016 2:09 PM KENDRYCK LACROIX  MRN:  [id number] Subjective:   Patient seen, chart reviewed and case discussed with nursing staff.  - Patient has been calmer and appropriate in the unit.   He states that he is doing fine. He talked with his mother yesterday and conversation "went fine." He denies SI/HI. He denies AH/VH. He denies any side effect from olanzapine and agrees to continue taking medication.   Principal Problem: Schizophrenia ([place]) Diagnosis:   Patient Active Problem List   Diagnosis Date Noted  . Cocaine dependence with cocaine-induced mood disorder ([place]) [F14.24] 03/19/2016  . Cocaine-induced mood disorder ([place]) [F14.94] 03/03/2016  . Cocaine abuse [F14.10] 03/03/2016  . Suicidal ideation [R45.851]   . Reflux esophagitis [K21.0] 05/24/2013  . Other specified gastritis without mention of hemorrhage [K29.60] 05/24/2013  . Nausea with vomiting [R11.2] 05/23/2013  . Anemia, unspecified [D64.9] 05/23/2013  . Nonspecific abnormal finding in stool contents [R19.5] 05/23/2013  . Schizophrenia ([place]) [F20.9]   . Ulcerative colitis ([place]) [K51.90]   . 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 Crohn's disease ([place])   . GI (gastrointestinal bleed) 04/2013  . Multiple myeloma ([place])   . Schizophrenia ([place])   . Sickle cell anemia (H[facility])   . Suicide attempt by drug ingestion ([place])   . Ulcerative colitis     Past Surgical History:  Procedure Laterality Date  . ESOPHAGOGASTRODUODENOSCOPY N/A 05/24/2013   Procedure: ESOPHAGOGASTRODUODENOSCOPY (EGD);  Surgeon: Ladene Artist, MD;  Location: [facility] ENDOSCOPY;  Service: Endoscopy;  Laterality: N/A;  . FLEXIBLE SIGMOIDOSCOPY  12/05/2011   Procedure: FLEXIBLE SIGMOIDOSCOPY;  Surgeon: Beryle Beams, MD;  Location: [facility] ENDOSCOPY;  Service: Endoscopy;  Laterality: N/A;  . MANDIBLE FRACTURE SURGERY     Family History:  Family History  Problem Relation Age of Onset  . Liver cancer     Family Psychiatric  History: see HPI Social History:  History  Alcohol Use  . Yes    Comment: quit     History  Drug Use  . Types: Cocaine, Marijuana    Comment: quit 1 mo ago    Social History   Social History  . Marital status: Single    Spouse name: N/A  . Number of children: N/A  . Years of education: GED   Occupational History  . disabled    Social History Main Topics  . Smoking status: Current Every Day Smoker    Packs/day: 0.25    Years: 20.00    Types: Cigarettes  . Smokeless tobacco: Never Used  . Alcohol use Yes     Comment: quit  . Drug use:     Types: Cocaine, Marijuana     Comment: quit 1 mo ago  . Sexual activity: No   Other Topics Concern  . None   Social History Narrative   Mr. Brunetto was recently released from prison, currently living at home with his mother.  He is disabled (due to mental illness?).    Additional Social History:    Pain Medications: none Prescriptions: none Over the Counter: none History of alcohol / drug use?: No history of alcohol / drug abuse  Sleep: Good  Appetite:  Good  Current Medications: Current Facility-Administered Medications  Medication Dose Route Frequency Provider Last Rate Last Dose  . acetaminophen (TYLENOL) tablet 650 mg  650 mg Oral Q6H PRN Kerrie Buffalo, NP   650 mg at 06/07/16 1344  . alum & mag hydroxide-simeth (MAALOX/MYLANTA) 200-200-20 MG/5ML suspension 30 mL  30 mL Oral Q4H PRN Kerrie Buffalo, NP      . magnesium hydroxide (MILK OF MAGNESIA) suspension 30 mL  30 mL Oral Daily PRN Kerrie Buffalo, NP      . OLANZapine (ZYPREXA) tablet 20 mg  20 mg Oral QHS Kerrie Buffalo, NP   20 mg at 06/06/16 2059  . OLANZapine zydis (ZYPREXA) disintegrating tablet 5 mg  5 mg Oral Q8H PRN  Norman Clay, MD       Facility-Administered Medications Ordered in Other Encounters  Medication Dose Route Frequency Provider Last Rate Last Dose  . hydrOXYzine (ATARAX/VISTARIL) tablet 50 mg  50 mg Oral TID PRN Encarnacion Slates, NP      . magnesium hydroxide (MILK OF MAGNESIA) suspension 30 mL  30 mL Oral Daily PRN Encarnacion Slates, NP        Lab Results:  Results for orders placed or performed during the hospital encounter of 06/04/16 (from the past 48 hour(s))  TSH     Status: None   Collection Time: 06/06/16  6:59 AM  Result Value Ref Range   TSH 1.690 0.350 - 4.500 uIU/mL    Comment: Performed at [facility]  Lipid panel     Status: Abnormal   Collection Time: 06/06/16  6:59 AM  Result Value Ref Range   Cholesterol 171 0 - 200 mg/dL   Triglycerides 292 (H) <150 mg/dL   HDL 53 >40 mg/dL   Total CHOL/HDL Ratio 3.2 RATIO   VLDL 58 (H) 0 - 40 mg/dL   LDL Cholesterol 60 0 - 99 mg/dL    Comment:        Total Cholesterol/HDL:CHD Risk Coronary Heart Disease Risk Table                     Men   Women  1/2 Average Risk   3.4   3.3  Average Risk       5.0   4.4  2 X Average Risk   9.6   7.1  3 X Average Risk  23.4   11.0        Use the calculated Patient Ratio above and the CHD Risk Table to determine the patient's CHD Risk.        ATP III CLASSIFICATION (LDL):  <100     mg/dL   Optimal  100-129  mg/dL   Near or Above                    Optimal  130-159  mg/dL   Borderline  160-189  mg/dL   High  >190     mg/dL   Very High Performed at [facility]   Prolactin     Status: Abnormal   Collection Time: 06/06/16  6:59 AM  Result Value Ref Range   Prolactin 31.9 (H) 4.0 - 15.2 ng/mL    Comment: (NOTE) Performed At: [facility] [place], [place] [id number] Lindon Romp MD [id number] Performed at [facility]   Hemoglobin A1c     Status: None   Collection Time: 06/06/16  6:59 AM  Result  Value Ref Range    Hgb A1c MFr Bld 5.5 4.8 - 5.6 %    Comment: (NOTE)         Pre-diabetes: 5.7 - 6.4         Diabetes: >6.4         Glycemic control for adults with diabetes: <7.0    Mean Plasma Glucose 111 mg/dL    Comment: (NOTE) Performed At: [facility] [place], [place] [id number] Lindon Romp MD [id number] Performed at [facility]     Blood Alcohol level:  Lab Results  Component Value Date   [facility] <5 06/02/2016   ETH <5 96/43/8381    Metabolic Disorder Labs: Lab Results  Component Value Date   HGBA1C 5.5 06/06/2016   MPG 111 06/06/2016   MPG 123 (H) 12/13/2011   Lab Results  Component Value Date   PROLACTIN 31.9 (H) 06/06/2016   Lab Results  Component Value Date   CHOL 171 06/06/2016   TRIG 292 (H) 06/06/2016   HDL 53 06/06/2016   CHOLHDL 3.2 06/06/2016   VLDL 58 (H) 06/06/2016   LDLCALC 60 06/06/2016   LDLCALC  12/17/2009    59        Total Cholesterol/HDL:CHD Risk Coronary Heart Disease Risk Table                     Men   Women  1/2 Average Risk   3.4   3.3  Average Risk       5.0   4.4  2 X Average Risk   9.6   7.1  3 X Average Risk  23.4   11.0        Use the calculated Patient Ratio above and the CHD Risk Table to determine the patient's CHD Risk.        ATP III CLASSIFICATION (LDL):  <100     mg/dL   Optimal  100-129  mg/dL   Near or Above                    Optimal  130-159  mg/dL   Borderline  160-189  mg/dL   High  >190     mg/dL   Very High    Physical Findings: AIMS: Facial and Oral Movements Muscles of Facial Expression: None, normal Lips and Perioral Area: None, normal Jaw: None, normal Tongue: None, normal,Extremity Movements Upper (arms, wrists, hands, fingers): None, normal Lower (legs, knees, ankles, toes): None, normal, Trunk Movements Neck, shoulders, hips: None, normal, Overall Severity Severity of abnormal movements (highest score from questions above): None, normal Incapacitation due  to abnormal movements: None, normal Patient's awareness of abnormal movements (rate only patient's report): No Awareness, Dental Status Current problems with teeth and/or dentures?: No Does patient usually wear dentures?: No  CIWA:  CIWA-Ar Total: 1 COWS:  COWS Total Score: 2  Musculoskeletal: Strength & Muscle Tone: within normal limits Gait & Station: normal Patient leans: N/A  Psychiatric Specialty Exam: Physical Exam  Review of Systems  Psychiatric/Behavioral: Positive for substance abuse. Negative for depression, hallucinations and suicidal ideas. The patient is not nervous/anxious and does not have insomnia.   All other systems reviewed and are negative.   Blood pressure (!) 113/56, pulse 85, temperature 99.2 F (37.3 C), resp. rate 20, height 6' (1.829 m), weight 184 lb (83.5 kg).Body mass index is 24.95 kg/m.  General Appearance: Casual  Eye Contact:  less intense- improving  Speech:  Normal Rate, answers in short phrases  Volume:  Normal  Mood:  good  Affect:  Constricted and irritable- improving  Thought Process:  Goal Directed  Orientation:  Full (Time, Place, and Person)  Thought Content:  linear, not forthcoming   Suicidal Thoughts:  No  Homicidal Thoughts:  No  Memory:  NA  Judgement:  Impaired  Insight:  Lacking  Psychomotor Activity:  Increased  Concentration:  Concentration: Fair and Attention Span: Fair  Recall:  [employer] of Knowledge:  Fair  Language:  Fair  Akathisia:  No  Handed:  Ambidextrous  AIMS (if indicated):     Assets:  Social Support  ADL's:  Intact  Cognition:  WNL  Sleep:  Number of Hours: 6   Assessment Deano Tomaszewski Russellis an 35 y.o.malewith history of schizophrenia, cocaine use, marijuana use who presented to [facility] emergency department after petitioned by his mother and was also referred from [place] with HI towards his mother and brother, hallucinations and aggressive behaviors including punching television in the setting of  non adherence to medication and cocaine/marijuana use. He was in jail from July 4 until the day before presented to ED for assaulting an officer and possession of drugs. He was IVC'd given risk of danger to others.  # Schizophrenia # r/o substance induced psychotic/mood disorder There has been an improvement in his hostility and irritability since restarting his medication. Unfortunately he declined the option of depot injection despite his history of non adherence; will continue current meds.   # Cocaine use disorder # THC use disorder Patient is precomtemplative stage for abstinent from drugs. Will continue motivational interview.   Plans - Continue Zyprexa 20 mg qhs - Continue Zyprexa 5 mg q8hprn for agitation - Reviewed TSH wnl, lipid panels- increased TG; needs monitoring with encouraging exercise and appropriate diet. - Prolactin reviewed; 31.9. Needs symptoms/level monitoring - Will obtain EKG tomorrow - Admit for crisis management and stabilization. - Medication management to reduce current symptoms to base line and improve the patient's overall level of functioning. - Monitor for the adverse effect of the medications and anger outbursts - Continue 15 minutes observation for safety concerns - Encouraged to participate in milieu therapy and group therapy counseling sessions and also work with coping skills -  Develop treatment plan to decrease risk of relapse upon discharge and to reduce the need for readmission. -  Psycho-social education regarding relapse prevention and self care. - Health care follow up as needed for medical problems. - Restart home medications where appropriate.  Treatment Plan Summary: Daily contact with patient to assess and evaluate symptoms and progress in treatment  Norman Clay, MD 06/07/2016, 2:09 PM

## 2016-06-07 NOTE — Plan of Care (Signed)
Problem: Education: Goal: Will be free of psychotic symptoms Outcome: Progressing Nurse discussed depression/anxiety/coping skills with patient.

## 2016-06-07 NOTE — Progress Notes (Signed)
Recreation Therapy Notes  Date: 12/09/15 Time: 1000 Location: 500 Hall Dayroom  Group Topic: Communication  Goal Area(s) Addresses:  Patient will effectively communicate with peers in group.  Patient will verbalize benefit of healthy communication. Patient will verbalize positive effect of healthy communication on post d/c goals.  Patient will identify communication techniques that made activity effective for group.   Behavioral Response: Engaged  Intervention:  Tax adviser, beach ball  Activity:  Keep it Chartered certified accountant.  Patients are to sit in a circle.  LRT will count every time the ball is hit by patients.  If the ball rolls flat on the floor or table, the count starts over.  The ball can bounce off the walls, tables, floor or ceiling but it can not roll flat.   Education: Communication, Discharge Planning  Education Outcome: Acknowledges understanding/In group clarification offered/Needs additional education.   Clinical Observations/Feedback:   Pt stated they had to use teamwork and communication to complete the activity.  Pt also expressed they had to "get everyone involved to reach the goal".   Victorino Sparrow, LRT/CTRS   Victorino Sparrow A 06/07/2016 11:41 AM

## 2016-06-07 NOTE — Progress Notes (Signed)
Adult Psychoeducational Group Note  Date:  06/07/2016 Time:  8:51 PM  Group Topic/Focus:  Wrap-Up Group:   The focus of this group is to help patients review their daily goal of treatment and discuss progress on daily workbooks.   Participation Level:  Active  Participation Quality:  Appropriate  Affect:  Appropriate  Cognitive:  Appropriate  Insight: Appropriate  Engagement in Group:  Engaged  Modes of Intervention:  Discussion  Additional Comments: The patient expressed that he attended groups. Nash Shearer 06/07/2016, 8:51 PM

## 2016-06-08 NOTE — Progress Notes (Signed)
Patient ID: [name], male   DOB: [birth year], 35 y.o.   MRN: [id number]   Pt currently presents with a masked affect and labile behavior. Pt reports to writer that their goal is to "go to bed." Pt mood less agitated than yesterday. Pt reports good sleep with current medication regimen. Pt attends group and then goes to room to lay down early.  Pt provided with medications per providers orders. Pt's labs and vitals were monitored throughout the night. Pt supported emotionally and encouraged to express concerns and questions. Pt educated on medications.  Pt's safety ensured with 15 minute and environmental checks. Pt currently denies SI/HI and A/V hallucinations. Pt verbally agrees to seek staff if SI/HI or A/VH occurs and to consult with staff before acting on any harmful thoughts. Will continue POC.
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
## 2016-06-08 NOTE — [facility] Group Notes (Signed)
[facility] Group Notes:  (Counselor/Nursing/MHT/Case Management/Adjunct)  06/08/2016 1:15PM  Type of Therapy:  Group Therapy  Participation Level:  Active  Participation Quality:  Appropriate  Affect:  Flat  Cognitive:  Oriented  Insight:  Improving  Engagement in Group:  Limited  Engagement in Therapy:  Limited  Modes of Intervention:  Discussion, Exploration and Socialization  Summary of Progress/Problems: The topic for group was balance in life.  Pt participated in the discussion about when their life was in balance and out of balance and how this feels.  Pt discussed ways to get back in balance and short term goals they can work on to get where they want to be.  Stayed the entire time, engaged throughout.  Was not invested in answering questions directly, but engaged none the less.  While talking about driving around looking for what sounded like trouble, and giving [place] a glimpse into his life in jail for 5 months, stated that he knows he is balanced because he is "thinking straight."  Others pointed out to him that his mood is much better than when he came in-that he is not mean and angry like he was.   Roque Lias B 06/08/2016 4:08 PM

## 2016-06-08 NOTE — Progress Notes (Signed)
Adult Psychoeducational Group Note  Date:  06/08/2016 Time:  9:37 PM  Group Topic/Focus:  Wrap-Up Group:   The focus of this group is to help patients review their daily goal of treatment and discuss progress on daily workbooks.   Participation Level:  Active  Participation Quality:  Attentive  Affect:  Appropriate  Cognitive:  Oriented  Insight: Good  Engagement in Group:  Supportive  Modes of Intervention:  Activity  Additional Comments:  Patient rated his day a 7. Goal is to get ready for discharge. [name] 06/08/2016, 9:37 PM

## 2016-06-08 NOTE — [facility] Group Notes (Signed)

## 2016-06-08 NOTE — Progress Notes (Signed)
Patient ID: [name], male   DOB: [birth year], 35 y.o.   MRN: [id number] [facility] MD Progress Note  06/08/2016 11:57 AM [name]  MRN:  [id number] Subjective:   Patient seen, chart reviewed and case discussed with nursing staff.  - Patient has been calmer and appropriate in the unit.   He states that he is doing good. He is thinking that he might go to motel rather than going to his mother's house when he is discharged. When he is asked the reason, he repeats stating that "I might be going to a motel." He states he does not remember his violent act in the hospital. He says "No" when he is asked if he is interested in dept injection. When he is asked the reason, he states "I said no." He denies SI/HI. He denies AH/VH.   Principal Problem: Schizophrenia ([place]) Diagnosis:   Patient Active Problem List   Diagnosis Date Noted  . Cocaine dependence with cocaine-induced mood disorder ([place]) [F14.24] 03/19/2016  . Cocaine-induced mood disorder ([place]) [F14.94] 03/03/2016  . Cocaine abuse [F14.10] 03/03/2016  . Suicidal ideation [R45.851]   . Reflux esophagitis [K21.0] 05/24/2013  . Other specified gastritis without mention of hemorrhage [K29.60] 05/24/2013  . Nausea with vomiting [R11.2] 05/23/2013  . Anemia, unspecified [D64.9] 05/23/2013  . Nonspecific abnormal finding in stool contents [R19.5] 05/23/2013  . Schizophrenia ([place]) [F20.9]   . Ulcerative colitis ([place]) [K51.90]   . 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 Crohn's disease ([place])   . GI (gastrointestinal bleed) 04/2013  . Multiple myeloma ([place])   . Schizophrenia ([place])   . Sickle cell anemia (HCC)   . Suicide attempt by drug ingestion ([place])   . Ulcerative colitis     Past Surgical History:  Procedure Laterality Date  . ESOPHAGOGASTRODUODENOSCOPY N/A 05/24/2013   Procedure:  ESOPHAGOGASTRODUODENOSCOPY (EGD);  Surgeon: Ladene Artist, MD;  Location: [facility] ENDOSCOPY;  Service: Endoscopy;  Laterality: N/A;  . FLEXIBLE SIGMOIDOSCOPY  12/05/2011   Procedure: FLEXIBLE SIGMOIDOSCOPY;  Surgeon: Beryle Beams, MD;  Location: [facility] ENDOSCOPY;  Service: Endoscopy;  Laterality: N/A;  . MANDIBLE FRACTURE SURGERY     Family History:  Family History  Problem Relation Age of Onset  . Liver cancer     Family Psychiatric  History: see HPI Social History:  History  Alcohol Use  . Yes    Comment: quit     History  Drug Use  . Types: Cocaine, Marijuana    Comment: quit 1 mo ago    Social History   Social History  . Marital status: Single    Spouse name: N/A  . Number of children: N/A  . Years of education: GED   Occupational History  . disabled    Social History Main Topics  . Smoking status: Current Every Day Smoker    Packs/day: 0.25    Years: 20.00    Types: Cigarettes  . Smokeless tobacco: Never Used  . Alcohol use Yes     Comment: quit  . Drug use:     Types: Cocaine, Marijuana     Comment: quit 1 mo ago  . Sexual activity: No   Other Topics Concern  . None   Social History Narrative   Mr. Walkup was recently released from prison, currently living at home with his mother.  He is  disabled (due to mental illness?).    Additional Social History:    Pain Medications: none Prescriptions: none Over the Counter: none History of alcohol / drug use?: No history of alcohol / drug abuse                    Sleep: Good  Appetite:  Good  Current Medications: Current Facility-Administered Medications  Medication Dose Route Frequency Provider Last Rate Last Dose  . acetaminophen (TYLENOL) tablet 650 mg  650 mg Oral Q6H PRN Kerrie Buffalo, NP   650 mg at 06/07/16 2140  . alum & mag hydroxide-simeth (MAALOX/MYLANTA) 200-200-20 MG/5ML suspension 30 mL  30 mL Oral Q4H PRN Kerrie Buffalo, NP      . magnesium hydroxide (MILK OF MAGNESIA) suspension 30  mL  30 mL Oral Daily PRN Kerrie Buffalo, NP      . OLANZapine (ZYPREXA) tablet 20 mg  20 mg Oral QHS Kerrie Buffalo, NP   20 mg at 06/07/16 2113  . OLANZapine zydis (ZYPREXA) disintegrating tablet 5 mg  5 mg Oral Q8H PRN Norman Clay, MD       Facility-Administered Medications Ordered in Other Encounters  Medication Dose Route Frequency Provider Last Rate Last Dose  . hydrOXYzine (ATARAX/VISTARIL) tablet 50 mg  50 mg Oral TID PRN Encarnacion Slates, NP      . magnesium hydroxide (MILK OF MAGNESIA) suspension 30 mL  30 mL Oral Daily PRN Encarnacion Slates, NP        Lab Results:  No results found for this or any previous visit (from the past 48 hour(s)).  Blood Alcohol level:  Lab Results  Component Value Date   ETH <5 06/02/2016   ETH <5 00/17/4944    Metabolic Disorder Labs: Lab Results  Component Value Date   HGBA1C 5.5 06/06/2016   MPG 111 06/06/2016   MPG 123 (H) 12/13/2011   Lab Results  Component Value Date   PROLACTIN 31.9 (H) 06/06/2016   Lab Results  Component Value Date   CHOL 171 06/06/2016   TRIG 292 (H) 06/06/2016   HDL 53 06/06/2016   CHOLHDL 3.2 06/06/2016   VLDL 58 (H) 06/06/2016   LDLCALC 60 06/06/2016   LDLCALC  12/17/2009    59        Total Cholesterol/HDL:CHD Risk Coronary Heart Disease Risk Table                     Men   Women  1/2 Average Risk   3.4   3.3  Average Risk       5.0   4.4  2 X Average Risk   9.6   7.1  3 X Average Risk  23.4   11.0        Use the calculated Patient Ratio above and the CHD Risk Table to determine the patient's CHD Risk.        ATP III CLASSIFICATION (LDL):  <100     mg/dL   Optimal  100-129  mg/dL   Near or Above                    Optimal  130-159  mg/dL   Borderline  160-189  mg/dL   High  >190     mg/dL   Very High    Physical Findings: AIMS: Facial and Oral Movements Muscles of Facial Expression: None, normal Lips and Perioral Area: None, normal Jaw: None, normal Tongue: None, normal,Extremity  Movements  Upper (arms, wrists, hands, fingers): None, normal Lower (legs, knees, ankles, toes): None, normal, Trunk Movements Neck, shoulders, hips: None, normal, Overall Severity Severity of abnormal movements (highest score from questions above): None, normal Incapacitation due to abnormal movements: None, normal Patient's awareness of abnormal movements (rate only patient's report): No Awareness, Dental Status Current problems with teeth and/or dentures?: No Does patient usually wear dentures?: No  CIWA:  CIWA-Ar Total: 1 COWS:  COWS Total Score: 2  Musculoskeletal: Strength & Muscle Tone: within normal limits Gait & Station: normal Patient leans: N/A  Psychiatric Specialty Exam: Physical Exam  Review of Systems  Psychiatric/Behavioral: Positive for substance abuse. Negative for depression, hallucinations and suicidal ideas. The patient is not nervous/anxious and does not have insomnia.   All other systems reviewed and are negative.   Blood pressure 108/68, pulse (!) 20, temperature 98.6 F (37 C), temperature source Oral, resp. rate 20, height 6' (1.829 m), weight 184 lb (83.5 kg).Body mass index is 24.95 kg/m.  General Appearance: Casual  Eye Contact:  less intense- improving  Speech:  Normal Rate, answers in short phrases  Volume:  Normal  Mood:  good  Affect:  Constricted and irritable- improving  Thought Process:  Goal Directed  Orientation:  Full (Time, Place, and Person)  Thought Content:  linear, not forthcoming   Suicidal Thoughts:  No  Homicidal Thoughts:  No  Memory:  NA  Judgement:  Impaired  Insight:  Lacking  Psychomotor Activity:  Increased  Concentration:  Concentration: Fair and Attention Span: Fair  Recall:  [employer] of Knowledge:  Fair  Language:  Fair  Akathisia:  No  Handed:  Ambidextrous  AIMS (if indicated):     Assets:  Social Support  ADL's:  Intact  Cognition:  WNL  Sleep:  Number of Hours: 6.25   Assessment Lowry Bala Russellis an 35  y.o.malewith history of schizophrenia, cocaine use, marijuana use who presented to [facility] emergency department after petitioned by his mother and was also referred from [place] with HI towards his mother and brother, hallucinations and aggressive behaviors including punching television in the setting of non adherence to medication and cocaine/marijuana use. He was in jail from July 4 until the day before presented to ED for assaulting an officer and possession of drugs. He was IVC'd given risk of danger to others.  # Schizophrenia # r/o substance induced psychotic/mood disorder There has been slow but steady improvement in his hostility and irritability since restarting his medication. Unfortunately he declined the option of depot injection despite his history of non adherence; will continue current meds.   # Cocaine use disorder # THC use disorder Patient is precomtemplative stage for abstinent from drugs. Will continue motivational interview.   Plans - Continue Zyprexa 20 mg qhs - Continue Zyprexa 5 mg q8hprn for agitation - Reviewed TSH wnl, lipid panels- increased TG; needs monitoring with encouraging exercise and appropriate diet. - Prolactin reviewed; 31.9. Needs symptoms/level monitoring - Reviewed EKG; QTc 440 msec on 8/24 - Medication management to reduce current symptoms to base line and improve the patient's overall level of functioning. - Monitor for the adverse effect of the medications and anger outbursts - Continue 15 minutes observation for safety concerns - Encouraged to participate in milieu therapy and group therapy counseling sessions and also work with coping skills -  Develop treatment plan to decrease risk of relapse upon discharge and to reduce the need for readmission. -  Psycho-social education regarding relapse prevention and self  care. - Health care follow up as needed for medical problems. - Restart home medications where appropriate.  Treatment Plan  Summary: Daily contact with patient to assess and evaluate symptoms and progress in treatment  Norman Clay, MD 06/08/2016, 11:57 AM

## 2016-06-08 NOTE — Progress Notes (Signed)
Recreation Therapy Notes  Date: 06/08/16 Time: 1000 Location: 500 Hall Dayroom   Group Topic: Communication, Team Building, Problem Solving  Goal Area(s) Addresses:  Patient will effectively work with peer towards shared goal.  Patient will identify skills used to make activity successful.  Patient will identify how skills used during activity can be used to reach post d/c goals.   Behavioral Response: Engaged  Intervention: STEM Activity  Activity: Geophysicist/field seismologist. In teams patients were given 12 plastic drinking straws and a length of masking tape. Using the materials provided patients were asked to build a landing pad to catch a golf ball dropped from approximately 6 feet in the air.   Education: Education officer, community, Discharge Planning   Education Outcome: Acknowledges education/In group clarification offered/Needs additional education.   Clinical Observations/Feedback: Pt was engaged throughout.  Pt helped his team come up with a concept to create their landing pad.  Pt stated they used creativity and problem solving.   Victorino Sparrow, LRT/CTRS    Victorino Sparrow A 06/08/2016 12:43 PM

## 2016-06-09 DIAGNOSIS — F1220 Cannabis dependence, uncomplicated: Secondary | ICD-10-CM | POA: Clinically undetermined

## 2016-06-09 DIAGNOSIS — F200 Paranoid schizophrenia: Principal | ICD-10-CM

## 2016-06-09 DIAGNOSIS — F1420 Cocaine dependence, uncomplicated: Secondary | ICD-10-CM | POA: Clinically undetermined

## 2016-06-09 MED ORDER — HYDROXYZINE HCL 25 MG PO TABS: 25.0000 mg | ORAL_TABLET | Freq: Three times a day (TID) | ORAL | Status: DC | PRN

## 2016-06-09 MED ORDER — LORAZEPAM 1 MG PO TABS
1.0000 mg | ORAL_TABLET | Freq: Four times a day (QID) | ORAL | Status: DC | PRN
  Administered 2016-06-11: 1 mg via ORAL
  Filled 2016-06-09: qty 1

## 2016-06-09 MED ORDER — OLANZAPINE 10 MG IM SOLR: 5.0000 mg | Freq: Two times a day (BID) | INTRAMUSCULAR | Status: DC | PRN

## 2016-06-09 MED ORDER — LORAZEPAM 2 MG/ML IJ SOLN: 1.0000 mg | Freq: Four times a day (QID) | INTRAMUSCULAR | Status: DC | PRN

## 2016-06-09 MED ORDER — OLANZAPINE 5 MG PO TABS: 5.0000 mg | ORAL_TABLET | Freq: Two times a day (BID) | ORAL | Status: DC | PRN

## 2016-06-09 NOTE — Progress Notes (Signed)
D: Patient's self inventory sheet: patient has good sleep, no sleep medication.good  Appetite, normal energy level, good concentration. Rated depression 0/10, hopeless 0/10, anxiety 0/10. SI/HI/AVH: denies. Physical complaints are denied. Goal is "focus". Plans to work on "focus".  Patient denies all but is minimally interactive in milieu. A: Medications administered, assessed medication knowledge and education given on medication regimen.  Emotional support and encouragement given patient. R: Denies SI and HI , contracts for safety. Safety maintained with 15 minute checks.

## 2016-06-09 NOTE — Plan of Care (Signed)
Problem: Safety: Goal: Periods of time without injury will increase Outcome: Progressing Pt remains free from harm 06/09/16

## 2016-06-09 NOTE — Progress Notes (Signed)
Adult Psychoeducational Group Note  Date:  06/09/2016 Time:  8:32 PM  Group Topic/Focus:  Wrap-Up Group:   The focus of this group is to help patients review their daily goal of treatment and discuss progress on daily workbooks.   Participation Level:  Active  Participation Quality:  Appropriate  Affect:  Appropriate  Cognitive:  Appropriate  Insight: Appropriate  Engagement in Group:  Engaged  Modes of Intervention:  Discussion  Additional Comments:  The patient expressed that he rates today a 9 which is good.The patient also said that he attended all groups. Nash Shearer 06/09/2016, 8:32 PM

## 2016-06-09 NOTE — Progress Notes (Addendum)
Patient ID: [name], male   DOB: Mar 05, 1981, 35 y.o.   MRN: [id number] [facility] MD Progress Note  06/09/2016 1:38 PM BETHANY CUMMING  MRN:  [id number] Subjective:  Patient states " I want to be my own payee , I do not want my mom involved. I am ok today ."  Objective: [name] a 35 y.o.AA malewith history of schizophrenia, cocaine use do , marijuana use do , who presented to [facility] emergency department after petitioned by his mother, referred from [place] with HI towards his mother and brother, hallucinations and aggressive behaviors including punching television in the setting of non adherence to medication and cocaine/marijuana use .  Patient seen and chart reviewed.Discussed patient with treatment team.  Patient today is seen as restless, paranoid - mostly towards his mother. Per staff - pt voiced threats to kill mother . This was discussed with patient . Pt denied it to Probation officer - stating " my mother is just exaggerating things." Pt does have a hx of violence and aggression and was recently released from jail for aggression towards an Garment/textile technologist.  Writer discussed telephone conference call with patient and mother - pt agreed - attempted to call mother [name] at [phone] - no answer. Discussed adding a long acting mood stabilizer with patient - pt to consider this. Pt also to consider a substance abuse treatment program.     Principal Problem: Schizophrenia ([place]) Diagnosis:   Patient Active Problem List   Diagnosis Date Noted  . Cocaine use disorder, moderate, dependence ([place]) [F14.20] 06/09/2016  . Cannabis use disorder, severe, dependence ([place]) [F12.20] 06/09/2016  . Cocaine dependence with cocaine-induced mood disorder ([place]) [F14.24] 03/19/2016  . 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 Cocaine abuse [F14.10] 03/03/2016  . Suicidal ideation [R45.851]   . Reflux esophagitis [K21.0] 05/24/2013  . Other specified gastritis without mention of  hemorrhage [K29.60] 05/24/2013  . Nausea with vomiting [R11.2] 05/23/2013  . Anemia, unspecified [D64.9] 05/23/2013  . Nonspecific abnormal finding in stool contents [R19.5] 05/23/2013  . Schizophrenia ([place]) [F20.9]   . Ulcerative colitis ([place]) [K51.90]   . SUBSTANCE ABUSE, MULTIPLE [F19.10] 12/31/2007   Total Time spent with patient: 30 minutes  Past Psychiatric History: see HPI  Past Medical History:  Past Medical History:  Diagnosis Date  . Crohn's disease ([place])   . GI (gastrointestinal bleed) 04/2013  . Multiple myeloma ([place])   . Schizophrenia ([place])   . Sickle cell anemia (HCC)   . Suicide attempt by drug ingestion ([place])   . Ulcerative colitis     Past Surgical History:  Procedure Laterality Date  . ESOPHAGOGASTRODUODENOSCOPY N/A 05/24/2013   Procedure: ESOPHAGOGASTRODUODENOSCOPY (EGD);  Surgeon: Ladene Artist, MD;  Location: [facility] ENDOSCOPY;  Service: Endoscopy;  Laterality: N/A;  . FLEXIBLE SIGMOIDOSCOPY  12/05/2011   Procedure: FLEXIBLE SIGMOIDOSCOPY;  Surgeon: Beryle Beams, MD;  Location: [facility] ENDOSCOPY;  Service: Endoscopy;  Laterality: N/A;  . MANDIBLE FRACTURE SURGERY     Family History:  Family History  Problem Relation Age of Onset  . Liver cancer     Family Psychiatric  History: see HPI Social History:  History  Alcohol Use  . Yes    Comment: quit     History  Drug Use  . Types: Cocaine, Marijuana    Comment: quit 1 mo ago    Social History   Social History  . Marital status: Single    Spouse name: N/A  . Number of children: N/A  .  Years of education: GED   Occupational History  . disabled    Social History Main Topics  . Smoking status: Current Every Day Smoker    Packs/day: 0.25    Years: 20.00    Types: Cigarettes  . Smokeless tobacco: Never Used  . Alcohol use Yes     Comment: quit  . Drug use:     Types: Cocaine, Marijuana     Comment: quit 1 mo ago  . Sexual activity: No   Other Topics Concern  . None   Social History Narrative    Mr. Luginbill was recently released from prison, currently living at home with his mother.  He is disabled (due to mental illness?).    Additional Social History:    Pain Medications: none Prescriptions: none Over the Counter: none History of alcohol / drug use?: No history of alcohol / drug abuse                    Sleep: Fair  Appetite:  Fair  Current Medications: Current Facility-Administered Medications  Medication Dose Route Frequency Provider Last Rate Last Dose  . acetaminophen (TYLENOL) tablet 650 mg  650 mg Oral Q6H PRN Kerrie Buffalo, NP   650 mg at 06/08/16 1200  . alum & mag hydroxide-simeth (MAALOX/MYLANTA) 200-200-20 MG/5ML suspension 30 mL  30 mL Oral Q4H PRN Kerrie Buffalo, NP      . magnesium hydroxide (MILK OF MAGNESIA) suspension 30 mL  30 mL Oral Daily PRN Kerrie Buffalo, NP      . OLANZapine (ZYPREXA) tablet 20 mg  20 mg Oral QHS Kerrie Buffalo, NP   20 mg at 06/08/16 2101  . OLANZapine zydis (ZYPREXA) disintegrating tablet 5 mg  5 mg Oral Q8H PRN Norman Clay, MD   5 mg at 06/08/16 1638   Facility-Administered Medications Ordered in Other Encounters  Medication Dose Route Frequency Provider Last Rate Last Dose  . hydrOXYzine (ATARAX/VISTARIL) tablet 50 mg  50 mg Oral TID PRN Encarnacion Slates, NP      . magnesium hydroxide (MILK OF MAGNESIA) suspension 30 mL  30 mL Oral Daily PRN Encarnacion Slates, NP        Lab Results:  No results found for this or any previous visit (from the past 48 hour(s)).  Blood Alcohol level:  Lab Results  Component Value Date   ETH <5 06/02/2016   ETH <5 41/32/4401    Metabolic Disorder Labs: Lab Results  Component Value Date   HGBA1C 5.5 06/06/2016   MPG 111 06/06/2016   MPG 123 (H) 12/13/2011   Lab Results  Component Value Date   PROLACTIN 31.9 (H) 06/06/2016   Lab Results  Component Value Date   CHOL 171 06/06/2016   TRIG 292 (H) 06/06/2016   HDL 53 06/06/2016   CHOLHDL 3.2 06/06/2016   VLDL 58 (H) 06/06/2016    LDLCALC 60 06/06/2016   LDLCALC  12/17/2009    59        Total Cholesterol/HDL:CHD Risk Coronary Heart Disease Risk Table                     Men   Women  1/2 Average Risk   3.4   3.3  Average Risk       5.0   4.4  2 X Average Risk   9.6   7.1  3 X Average Risk  23.4   11.0        Use the calculated  Patient Ratio above and the CHD Risk Table to determine the patient's CHD Risk.        ATP III CLASSIFICATION (LDL):  <100     mg/dL   Optimal  100-129  mg/dL   Near or Above                    Optimal  130-159  mg/dL   Borderline  160-189  mg/dL   High  >190     mg/dL   Very High    Physical Findings: AIMS: Facial and Oral Movements Muscles of Facial Expression: None, normal Lips and Perioral Area: None, normal Jaw: None, normal Tongue: None, normal,Extremity Movements Upper (arms, wrists, hands, fingers): None, normal Lower (legs, knees, ankles, toes): None, normal, Trunk Movements Neck, shoulders, hips: None, normal, Overall Severity Severity of abnormal movements (highest score from questions above): None, normal Incapacitation due to abnormal movements: None, normal Patient's awareness of abnormal movements (rate only patient's report): No Awareness, Dental Status Current problems with teeth and/or dentures?: No Does patient usually wear dentures?: No  CIWA:  CIWA-Ar Total: 1 COWS:  COWS Total Score: 2  Musculoskeletal: Strength & Muscle Tone: within normal limits Gait & Station: normal Patient leans: N/A  Psychiatric Specialty Exam: Physical Exam  Nursing note and vitals reviewed.   Review of Systems  Psychiatric/Behavioral: Positive for substance abuse. Negative for depression, hallucinations and suicidal ideas. The patient is nervous/anxious. The patient does not have insomnia.   All other systems reviewed and are negative.   Blood pressure (!) 110/58, pulse 98, temperature 97.3 F (36.3 C), temperature source Oral, resp. rate 16, height 6' (1.829 m),  weight 83.5 kg (184 lb).Body mass index is 24.95 kg/m.  General Appearance: Casual  Eye Contact:  less intense- improving  Speech:  Normal Rate  Volume:  Normal  Mood:  Anxious and Irritable  Affect:  Congruent   Thought Process:  Goal Directed  Orientation:  Full (Time, Place, and Person)  Thought Content:  linear, not forthcoming   Suicidal Thoughts:  No  Homicidal Thoughts:  per staff voiced threats against mother with whom he stays - currently denies it  Memory:  Immediate;   Fair Recent;   Fair Remote;   Fair  Judgement:  Impaired  Insight:  Lacking  Psychomotor Activity:  Increased and Restlessness  Concentration:  Concentration: Fair and Attention Span: Fair  Recall:  [employer] of Knowledge:  Fair  Language:  Fair  Akathisia:  No  Handed:  Ambidextrous  AIMS (if indicated):     Assets:  Social Support  ADL's:  Intact  Cognition:  WNL  Sleep:  Number of Hours: 6.5   Assessment Keith Lannen Russellis an 35 y.o.malewith history of schizophrenia, cocaine use do and  marijuana use do  who presented to [facility] emergency department after being petitioned by his mother and was also referred from [place] with HI towards his mother and brother, hallucinations and aggressive behaviors including punching television in the setting of non adherence to medication and cocaine/marijuana use. He was in jail from July 4 until the day before presented to ED for assaulting an officer and possession of drugs. He was IVC'd given risk of danger to others.  Patient as per staff continues to make threats to hurt mother - will observe on the unit.  Plans - Continue Zyprexa 20 mg po qhs for mood sx/psychosis.  - Continue Zyprexa 5 mg po /IM q 12 h prn for agitation. -  Patient to consider a  Mood stabilizer - will continue to educate . - Reviewed TSH wnl, lipid panels- increased TG; needs monitoring with encouraging exercise and appropriate diet. - Prolactin reviewed; 31.9. Needs symptoms/level  monitoring - Reviewed EKG; QTc 440 msec on 8/24 - CSW will continue to work on disposition plan.  -Will consider referral to a substance abuse treatment program - pt will let writer know if he is interested. Treatment Plan Summary: Daily contact with patient to assess and evaluate symptoms and progress in treatment  Scottie Stanish, MD 06/09/2016, 1:38 PM

## 2016-06-09 NOTE — [facility] Group Notes (Signed)
[place] LCSW Group Therapy  06/09/2016  1:05 PM  Type of Therapy:  Group therapy  Participation Level:  Active  Participation Quality:  Attentive  Affect:  Flat  Cognitive:  Oriented  Insight:  Limited  Engagement in Therapy:  Limited  Modes of Intervention:  Discussion, Socialization  Summary of Progress/Problems:  Chaplain was here to lead a group on themes of hope and courage. Left to see the Dr for awhile, but returned. "I have hope that I will get out of here soon-it probably won't be until after the weekend, but there is always hope."  Talked about carrying the hope of his father, who is deceased, in his heart.  Rambled on a bit about conflictual relationship with mother. He feels like she is trying to control him.  Mood is better today, also more focused.    [name] 06/09/2016 1:34 PM

## 2016-06-09 NOTE — Progress Notes (Signed)
Patient ID: [name], male   DOB: 15-Jun-1981, 35 y.o.   MRN: [id number] PER STATE REGULATIONS 482.30  THIS CHART WAS REVIEWED FOR MEDICAL NECESSITY WITH RESPECT TO THE PATIENT'S ADMISSION/ DURATION OF STAY.  NEXT REVIEW DATE: 06/12/2016  Chauncy Lean, RN, BSN CASE MANAGER

## 2016-06-09 NOTE — Progress Notes (Signed)
Recreation Therapy Notes  Date: 06/09/16 Time: 1000 Location: 500 Dayroom  Group Topic: Coping Skills  Goal Area(s) Addresses:  Patients will able to identify positive coping skills. Patient will be able to identify the benefits of positive coping skills post d/c.  Intervention: Coping skills worksheet, scissors, glue sticks, magazines  Activity: Patients were given coping skills worksheets broken down into the areas of diversions, cognitive, tension releasers, physical and social.  Patients were to identify at least two coping skills for each area.  Patients were to find pictures in magazines that represent their coping skills and glue them to the appropriate section on the worksheet.   Education: Radiographer, therapeutic, Dentist.   Education Outcome: Acknowledges understanding/In group clarification offered/Needs additional education.   Clinical Observations/Feedback: Pt did not attend group.   Victorino Sparrow, LRT/CTRS   Victorino Sparrow A 06/09/2016 12:40 PM
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
## 2016-06-10 DIAGNOSIS — F209 Schizophrenia, unspecified: Secondary | ICD-10-CM

## 2016-06-10 NOTE — Progress Notes (Signed)
DAR NOTE: Patient presents with flat affect and depressed mood.  Denies pain, auditory and visual hallucinations.  Remained in his room most of this shift.  Minimal interaction with staff and peers.  Rates depression at 0, hopelessness at 0, and anxiety at 0.  Maintained on routine safety checks.  Medications given as prescribed.  Support and encouragement offered as needed.  States goal for today is "focus."  Patient requested and received Tylenol 650 mg for complain of headache with good effect.  Patient only visible in the milieu briefly.

## 2016-06-10 NOTE — Progress Notes (Signed)
Adult Psychoeducational Group Note  Date:  06/10/2016 Time:  8:46 PM  Group Topic/Focus:  Wrap-Up Group:   The focus of this group is to help patients review their daily goal of treatment and discuss progress on daily workbooks.   Participation Level:  Minimal  Participation Quality:  Appropriate  Affect:  Appropriate  Cognitive:  Alert  Insight: Appropriate  Engagement in Group:  Engaged  Modes of Intervention:  Discussion  Additional Comments:  Pt stated that he is doing well and his goal for tomorrow is to do better.  Sharmon Revere 06/10/2016, 8:46 PM

## 2016-06-10 NOTE — Progress Notes (Signed)
D: Pt denied depression, anxiety, pain, SI, HI or AVH; states, "There is nothing wrong with me." Pt could be arrogant and hostile; states to the nurse, "what do you mean there is no more Gatorade? I was told by the other nurse to ask whenever I want it; why come to work if you can't do your job." A: Medications offered as prescribed.  Support, encouragement, and safe environment provided.  15-minute safety checks continue. R: Pt was med compliant.  Pt attended group. Safety checks continue.Marland Kitchen

## 2016-06-10 NOTE — [facility] Group Notes (Addendum)
[facility] Group Notes:  (Clinical Social Work)  06/10/2016  11:15-12:00PM  Summary of Progress/Problems:   Today's process group involved patients discussing their feelings related to being hospitalized, as well as how they can use their present feelings to stay out of the hospital in the future.  We also had a discussion about the stigma of mental illness versus physical ailments, and how each person can positively influence people around them just in the way they talk, I.e. "I have Schizophrenia" versus "I am schizophrenic."   The patient expressed her primary feeling about being hospitalized is "good" and "therapeutic" and said he is receiving medication regulation which is a good thing.  He spoke freely throughout group, either volunteering opinions or responding to questions; however, they were often off topic and perseverative in nature about his anger at people forcing him to come to the hospital when he feels they should not have done that.  When asked how he would want people to identify him, he stated, "truthful" and explained that now people think he lies.  Type of Therapy:  Group Therapy - Process  Participation Level:  Active  Participation Quality:  Attentive and Sharing  Affect:  Flat  Cognitive:  Disorganized  Insight:  Improving  Engagement in Therapy:  Engaged  Modes of Intervention:  Exploration, Discussion  Selmer Dominion, LCSW 06/10/2016, 12:55 PM
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
## 2016-06-11 NOTE — Progress Notes (Signed)
[facility] MD Progress Note  06/11/2016 11:26 AM [name]  MRN:  [id number]   Subjective:  Patient states "Iam having a good day today."  Objective: [name] is awake, alert and oriented * 3 Seen resting in bedroom. Denies suicidal or homicidal ideation. Denies auditory or visual hallucination and does not appear to be responding to internal stimuli. Patient has still flat and guarded during assessment. Patient responds with yea and no responds.Patient reports he is medication compliant without mediation side effects.Patient denies depression or depressive symptoms. Reports good appetite and states he is resting well throughout the night. Support, encouragement and reassurance was provided.   Noted by MD on 06/09/2016 Patient seen and chart reviewed.Discussed patient with treatment team.  Patient today is seen as restless, paranoid - mostly towards his mother. Per staff - pt voiced threats to kill mother . This was discussed with patient . Pt denied it to Probation officer - stating " my mother is just exaggerating things." Pt does have a hx of violence and aggression and was recently released from jail for aggression towards an Garment/textile technologist.  Writer discussed telephone conference call with patient and mother - pt agreed - attempted to call mother [name] at [phone] - no answer. Discussed adding a long acting mood stabilizer with patient - pt to consider this. Pt also to consider a substance abuse treatment program.     Principal Problem: Schizophrenia ([place]) Diagnosis:   Patient Active Problem List   Diagnosis Date Noted  . Cocaine use disorder, moderate, dependence ([place]) [F14.20] 06/09/2016  . Cannabis use disorder, severe, dependence ([place]) [F12.20] 06/09/2016  . Cocaine dependence with cocaine-induced mood disorder ([place]) [F14.24] 03/19/2016  . Cocaine-induced mood disorder ([place]) [F14.94] 03/03/2016  . Cocaine abuse [F14.10] 03/03/2016  . Suicidal ideation [R45.851]   . Reflux  esophagitis [K21.0] 05/24/2013  . Other specified gastritis without mention of hemorrhage [K29.60] 05/24/2013  . Nausea with vomiting [R11.2] 05/23/2013  . Anemia, unspecified [D64.9] 05/23/2013  . Nonspecific abnormal finding in stool contents [R19.5] 05/23/2013  . Schizophrenia ([place]) [F20.9]   . Ulcerative colitis ([place]) [K51.90]   . SUBSTANCE ABUSE, MULTIPLE [F19.10] 12/31/2007   Total Time spent with patient: 30 minutes  Past Psychiatric History: see HPI  Past Medical History:  Past Medical History:  Diagnosis Date  . Crohn's disease ([place])   . GI (gastrointestinal bleed) 04/2013  . Multiple myeloma ([place])   . Schizophrenia ([place])   . Sickle cell anemia (HCC)   . Suicide attempt by drug ingestion ([place])   . Ulcerative colitis     Past Surgical History:  Procedure Laterality Date  . ESOPHAGOGASTRODUODENOSCOPY N/A 05/24/2013   Procedure: ESOPHAGOGASTRODUODENOSCOPY (EGD);  Surgeon: Ladene Artist, MD;  Location: [facility] ENDOSCOPY;  Service: Endoscopy;  Laterality: N/A;  . FLEXIBLE SIGMOIDOSCOPY  12/05/2011   Procedure: FLEXIBLE SIGMOIDOSCOPY;  Surgeon: Beryle Beams, MD;  Location: [facility] ENDOSCOPY;  Service: Endoscopy;  Laterality: N/A;  . MANDIBLE FRACTURE SURGERY     Family History:  Family History  Problem Relation Age of Onset  . Liver cancer     Family Psychiatric  History: see HPI Social History:  History  Alcohol Use  . Yes    Comment: quit     History  Drug Use  . Types: Cocaine, Marijuana    Comment: quit 1 mo ago    Social History   Social History  . Marital status: Single    Spouse name: N/A  . Number of children: N/A  .  Years of education: GED   Occupational History  . disabled    Social History Main Topics  . Smoking status: Current Every Day Smoker    Packs/day: 0.25    Years: 20.00    Types: Cigarettes  . Smokeless tobacco: Never Used  . Alcohol use Yes     Comment: quit  . Drug use:     Types: Cocaine, Marijuana     Comment: quit 1 mo ago  .  Sexual activity: No   Other Topics Concern  . None   Social History Narrative   Mr. [name] was recently released from prison, currently living at home with his mother.  He is disabled (due to mental illness?).    Additional Social History:    Pain Medications: none Prescriptions: none Over the Counter: none History of alcohol / drug use?: No history of alcohol / drug abuse                    Sleep: Fair  Appetite:  Fair  Current Medications: Current Facility-Administered Medications  Medication Dose Route Frequency Provider Last Rate Last Dose  . acetaminophen (TYLENOL) tablet 650 mg  650 mg Oral Q6H PRN Kerrie Buffalo, NP   650 mg at 06/10/16 2019  . alum & mag hydroxide-simeth (MAALOX/MYLANTA) 200-200-20 MG/5ML suspension 30 mL  30 mL Oral Q4H PRN Kerrie Buffalo, NP      . hydrOXYzine (ATARAX/VISTARIL) tablet 25 mg  25 mg Oral TID PRN Ursula Alert, MD      . LORazepam (ATIVAN) tablet 1 mg  1 mg Oral Q6H PRN Ursula Alert, MD       Or  . LORazepam (ATIVAN) injection 1 mg  1 mg Intramuscular Q6H PRN Saramma Eappen, MD      . magnesium hydroxide (MILK OF MAGNESIA) suspension 30 mL  30 mL Oral Daily PRN Kerrie Buffalo, NP      . OLANZapine (ZYPREXA) tablet 5 mg  5 mg Oral Q12H PRN Ursula Alert, MD       Or  . OLANZapine (ZYPREXA) injection 5 mg  5 mg Intramuscular Q12H PRN Ursula Alert, MD      . OLANZapine (ZYPREXA) tablet 20 mg  20 mg Oral QHS Kerrie Buffalo, NP   20 mg at 06/10/16 2037   Facility-Administered Medications Ordered in Other Encounters  Medication Dose Route Frequency Provider Last Rate Last Dose  . hydrOXYzine (ATARAX/VISTARIL) tablet 50 mg  50 mg Oral TID PRN Encarnacion Slates, NP      . magnesium hydroxide (MILK OF MAGNESIA) suspension 30 mL  30 mL Oral Daily PRN Encarnacion Slates, NP        Lab Results:  No results found for this or any previous visit (from the past 48 hour(s)).  Blood Alcohol level:  Lab Results  Component Value Date   ETH <5  06/02/2016   ETH <5 32/20/2542    Metabolic Disorder Labs: Lab Results  Component Value Date   HGBA1C 5.5 06/06/2016   MPG 111 06/06/2016   MPG 123 (H) 12/13/2011   Lab Results  Component Value Date   PROLACTIN 31.9 (H) 06/06/2016   Lab Results  Component Value Date   CHOL 171 06/06/2016   TRIG 292 (H) 06/06/2016   HDL 53 06/06/2016   CHOLHDL 3.2 06/06/2016   VLDL 58 (H) 06/06/2016   LDLCALC 60 06/06/2016   LDLCALC  12/17/2009    59        Total Cholesterol/HDL:CHD Risk Coronary Heart  Disease Risk Table                     Men   Women  1/2 Average Risk   3.4   3.3  Average Risk       5.0   4.4  2 X Average Risk   9.6   7.1  3 X Average Risk  23.4   11.0        Use the calculated Patient Ratio above and the CHD Risk Table to determine the patient's CHD Risk.        ATP III CLASSIFICATION (LDL):  <100     mg/dL   Optimal  100-129  mg/dL   Near or Above                    Optimal  130-159  mg/dL   Borderline  160-189  mg/dL   High  >190     mg/dL   Very High    Physical Findings: AIMS: Facial and Oral Movements Muscles of Facial Expression: None, normal Lips and Perioral Area: None, normal Jaw: None, normal Tongue: None, normal,Extremity Movements Upper (arms, wrists, hands, fingers): None, normal Lower (legs, knees, ankles, toes): None, normal, Trunk Movements Neck, shoulders, hips: None, normal, Overall Severity Severity of abnormal movements (highest score from questions above): None, normal Incapacitation due to abnormal movements: None, normal Patient's awareness of abnormal movements (rate only patient's report): No Awareness, Dental Status Current problems with teeth and/or dentures?: No Does patient usually wear dentures?: No  CIWA:  CIWA-Ar Total: 1 COWS:  COWS Total Score: 2  Musculoskeletal: Strength & Muscle Tone: within normal limits Gait & Station: normal Patient leans: N/A  Psychiatric Specialty Exam: Physical Exam  Nursing note and  vitals reviewed. Constitutional: He is oriented to person, place, and time. He appears well-developed.  HENT:  Head: Normocephalic.  Cardiovascular: Normal rate.   Neurological: He is alert and oriented to person, place, and time.  Psychiatric: He has a normal mood and affect. His behavior is normal.    Review of Systems  Psychiatric/Behavioral: Positive for substance abuse. Negative for depression, hallucinations and suicidal ideas. The patient is nervous/anxious. The patient does not have insomnia.   All other systems reviewed and are negative.   Blood pressure 108/66, pulse 93, temperature 98.6 F (37 C), temperature source Oral, resp. rate 18, height 6' (1.829 m), weight 83.5 kg (184 lb).Body mass index is 24.95 kg/m.  General Appearance: Casual  Eye Contact:  Fair  Speech:  Normal Rate  Volume:  Normal  Mood:  Anxious and Irritable  Affect:  Congruent   Thought Process:  Goal Directed  Orientation:  Full (Time, Place, and Person)  Thought Content:  linear  Suicidal Thoughts:  No  Homicidal Thoughts:  per staff voiced threats against mother with whom he stays - currently denies it -denies homicidal ideation today  Memory:  Immediate;   Fair Recent;   Fair Remote;   Fair  Judgement:  Impaired  Insight:  Lacking  Psychomotor Activity:  Restlessness  Concentration:  Concentration: Fair and Attention Span: Fair  Recall:  [employer] of Knowledge:  Fair  Language:  Fair  Akathisia:  No  Handed:  Ambidextrous  AIMS (if indicated):     Assets:  Social Support  ADL's:  Intact  Cognition:  WNL  Sleep:  Number of Hours: 6.25    I agree with current treatment plan on 06/11/2016, Patient seen face-to-face for  psychiatric evaluation follow-up, chart reviewed.Reviewed the information documented and agree with the treatment plan.  Treatment and Summary Plan: Daily contact with patient to assess and evaluate symptoms and progress in treatment - Continue Zyprexa 20 mg po qhs for  mood sx/psychosis.  - Continue Zyprexa 5 mg po /IM q 12 h prn for agitation. -Patient to consider a  Mood stabilizer - will continue to educate . - Reviewed TSH wnl, lipid panels- increased TG; needs monitoring with encouraging exercise and appropriate diet. - Prolactin reviewed; 31.9. Needs symptoms/level monitoring - Reviewed EKG; QTc 440 msec on 8/24 - CSW will continue to work on disposition plan.  -Will consider referral to a substance abuse treatment program - pt will let writer know if he is interested.  Derrill Center, NP 06/11/2016, 11:26 AM   Agree with NP progress note as above

## 2016-06-11 NOTE — BHH Group Notes (Signed)
[facility] Group Notes:  (Nursing/MHT/Case Management/Adjunct)  Date:  06/11/2016  Time:  11:44 AM  Type of Therapy:  Nurse Education  Participation Level:  Minimal  Participation Quality:  Appropriate  Affect:  Appropriate and Blunted  Cognitive:  Appropriate  Insight:  Appropriate  Engagement in Group:  Engaged  Modes of Intervention:  Discussion, Education and Support  Summary of Progress/Problems: Montine Circle shared that his goal is to go home.  Marya Landry 06/11/2016, 11:44 AM

## 2016-06-11 NOTE — BHH Group Notes (Signed)
[facility] Group Notes:  (Clinical Social Work)  06/11/2016  11:00AM-12:00PM  Summary of Progress/Problems:  The main focus of today's process group was to listen to a variety of genres of music and to identify that different types of music provoke different responses.  The patient then was able to identify personally what was soothing for them, as well as energizing, as well as how patient can personally use this knowledge in sleep habits, with depression, and with other symptoms.  The patient expressed at the beginning of group the overall feeling of good/content.  He listened attentively for the most part, left the room for awhile, but did return.  Side conversations of other patients were bothering him  Type of Therapy:  Music Therapy   Participation Level:  Active  Participation Quality:  Attentive and Sharing  Affect:  Blunted  Cognitive:  Oriented  Insight:  Engaged  Engagement in Therapy:  Engaged  Modes of Intervention:   Activity, Exploration  Selmer Dominion, LCSW 06/11/2016

## 2016-06-11 NOTE — Progress Notes (Signed)
D: Patient's self inventory sheet: patient has good sleep, no sleep medication.good  Appetite, normal energy level, good concentration. Rated depression 0/10, hopeless 0/10, anxiety 0/10. SI/HI/AVH: denies. Physical complaints are denied. Goal is "stay focus". Plans to work on "staying focus". Patient is minimally interactive, keeping to himself. NO initiation of interaction, answers questions with very brief answers..   A: Medications administered, assessed medication knowledge and education given on medication regimen.  Emotional support and encouragement given patient. R: denies SI and HI , contracts for safety. Safety maintained with 15 minute checks.

## 2016-06-11 NOTE — Progress Notes (Signed)
   D:  Pt didn't engage the writer in in depth conversation. Informed the writer that he had a "good day". Pt has no questions or concerns.    A:  Support and encouragement was offered. 15 min checks continued for safety.  R: Pt remains safe.

## 2016-06-12 MED ORDER — OLANZAPINE 20 MG PO TABS: 20.0000 mg | ORAL_TABLET | Freq: Every day | ORAL | 0 refills | Status: DC

## 2016-06-12 MED ORDER — HYDROXYZINE HCL 25 MG PO TABS: 25.0000 mg | ORAL_TABLET | Freq: Three times a day (TID) | ORAL | 0 refills | Status: DC | PRN

## 2016-06-12 NOTE — Progress Notes (Signed)
Discharge Note:  Patient discharged home with bus ticket.  Patient denied SI and HI.  Denied A/V hallucinations  Denied pain.  Suicide prevention information given and discussed with patient who stated he understood and had no questions.  Patient stated he received all his belongings, clothing, shoes, cigarettes, belt, charger, phone, key ring, prescriptions.  Patient stated he appreciated all assistance received from [facility] staff.  All required discharge information given to patient at discharge.

## 2016-06-12 NOTE — Plan of Care (Signed)
Problem: [facility] Participation in Recreation Therapeutic Interventions Goal: STG-Patient will identify at least five coping skills for ** STG: Coping Skills - Patient will be able to identify at least 5 coping skills for anger by conclusion of recreation therapy tx  Outcome: Completed/Met Date Met: 06/12/16 Pt was able to identify coping skills after completing anger management recreation group.  Victorino Sparrow, LRT/CTRS

## 2016-06-12 NOTE — BHH Suicide Risk Assessment (Signed)
[facility] Discharge Suicide Risk Assessment   Principal Problem: Schizophrenia [facility]) Discharge Diagnoses:  Patient Active Problem List   Diagnosis Date Noted  . Cocaine use disorder, moderate, dependence ([place]) [F14.20] 06/09/2016  . Cannabis use disorder, severe, dependence ([place]) [F12.20] 06/09/2016  . Cocaine dependence with cocaine-induced mood disorder ([place]) [F14.24] 03/19/2016  . Cocaine-induced mood disorder ([place]) [F14.94] 03/03/2016  . Cocaine abuse [F14.10] 03/03/2016  . Suicidal ideation [R45.851]   . Reflux esophagitis [K21.0] 05/24/2013  . Other specified gastritis without mention of hemorrhage [K29.60] 05/24/2013  . Nausea with vomiting [R11.2] 05/23/2013  . Anemia, unspecified [D64.9] 05/23/2013  . Nonspecific abnormal finding in stool contents [R19.5] 05/23/2013  . Schizophrenia ([place]) [F20.9]   . Ulcerative colitis ([place]) [K51.90]   . SUBSTANCE ABUSE, MULTIPLE [F19.10] 12/31/2007    Total Time spent with patient: 30 minutes  Musculoskeletal: Strength & Muscle Tone: within normal limits Gait & Station: normal Patient leans: N/A  Psychiatric Specialty Exam: Review of Systems  Psychiatric/Behavioral: Negative for depression and suicidal ideas.  All other systems reviewed and are negative.   Blood pressure 123/84, pulse 92, temperature 97.9 F (36.6 C), temperature source Oral, resp. rate 18, height 6' (1.829 m), weight 83.5 kg (184 lb).Body mass index is 24.95 kg/m.  General Appearance: Casual  Eye Contact::  Fair  Speech:  Clear and Coherent409  Volume:  Normal  Mood:  Euthymic  Affect:  Appropriate  Thought Process:  Goal Directed and Descriptions of Associations: Intact  Orientation:  Full (Time, Place, and Person)  Thought Content:  Logical  Suicidal Thoughts:  No  Homicidal Thoughts:  No  Memory:  Immediate;   Fair Recent;   Fair Remote;   Fair  Judgement:  Fair  Insight:  Fair  Psychomotor Activity:  Normal  Concentration:  Fair  Recall:  [employer]  of Knowledge:Fair  Language: Fair  Akathisia:  No  Handed:  Right  AIMS (if indicated):     Assets:  Desire for Improvement  Sleep:  Number of Hours: 6.5  Cognition: WNL  ADL's:  Intact   Mental Status Per Nursing Assessment::   On Admission:  NA  Demographic Factors:  Male  Loss Factors: NA  Historical Factors: Impulsivity  Risk Reduction Factors:   Positive social support  Continued Clinical Symptoms:  Schizophrenia:   Less than 79 years old  Cognitive Features That Contribute To Risk:  None    Suicide Risk:  Minimal: No identifiable suicidal ideation.  Patients presenting with no risk factors but with morbid ruminations; may be classified as minimal risk based on the severity of the depressive symptoms  Follow-up Information    [facility] .   Specialty:  Behavioral Health Why:  Please use [facility] to establish for services.  Hours are Mon - Fri from 8:30 - 3.  Arrive early for prompt service.  Bring hospital discharge paperwork to appointment.   Contact information: [place] [id number] [phone]        [facility] .           Plan Of Care/Follow-up recommendations:  Activity:  no restrictions Diet:  regular Tests:  as needed Other:  follow up with aftercare  Aneliese Beaudry, MD 06/12/2016, 9:30 AM

## 2016-06-12 NOTE — Progress Notes (Signed)
Recreation Therapy Notes  Date: 06/12/16 Time: 1000 Location: 500 [address] Dayroom  Group Topic: Anger Management  Goal Area(s) Addresses:  Patient will identify triggers for anger.  Patient will identify physical reaction to anger.   Patient will identify benefit of using coping skills when angry.  Behavioral Response:  Engaged  Intervention: Anger Radio broadcast assistant, pencils  Activity: Anger Umbrella.  Patients were given worksheets with umbrellas on them to fill in.  Patients were to write the things that trigger their anger on the outside of the umbrella and the emotional reactions to those triggers on the inside of the umbrella.  Education: Anger Management, Discharge Planning   Education Outcome: Acknowledges education/In group clarification offered/Needs additional education.   Clinical Observations/Feedback: Pt stated he gets angry when "people lie, think they are better and rumors".  Pt explained he expresses his anger by getting into trouble, being careless and getting mad.  One of the ways pt expressed dealing with his anger was by "talking it out to find a solution to the problem".    Victorino Sparrow, LRT/CTRS    Ria Comment, Tyller Bowlby A 06/12/2016 12:12 PM

## 2016-06-12 NOTE — Progress Notes (Signed)
Santiago Bur had been up and visible this evening, interacting some in milieu but mostly staying to himself. Desmund has appeared watchful this evening and easily irritated. Nichlas spoke of having a headache and requested and received some tylenol with good results. Mohamedamin also received bedtime medications without incident before retiring to his room for the evening. A. Support and encouragement provided. R. Safety maintained, will continue to monitor.

## 2016-06-12 NOTE — Progress Notes (Signed)
  [facility] Adult Case Management Discharge Plan :  Will you be returning to the same living situation after discharge:  Yes,  home At discharge, do you have transportation home?: Yes,  bus pass Do you have the ability to pay for your medications: Yes,  MCD  Release of information consent forms completed and in the chart;  Patient's signature needed at discharge.  Patient to Follow up at: Follow-up Information    [facility] .   Why:  Cleo from the ACT team will call your mother later today with an appointment time and date. Call them at this number if you have not heard from them by tomorrow afternoon. They should be able to see you by Wednesday at the latest Contact information: [address]  [place] [phone]          Next level of care provider has access to [place] and Suicide Prevention discussed: Yes,  yes  Have you used any form of tobacco in the last 30 days? (Cigarettes, Smokeless Tobacco, Cigars, and/or Pipes): Patient Refused Screening  Has patient been referred to the Quitline?: Patient refused referral  Patient has been referred for addiction treatment: Yes  Trish Mage 06/12/2016, 11:56 AM

## 2016-06-12 NOTE — Progress Notes (Signed)
D:  Patient's self inventory sheet, patient sleeps good, no sleep medication given.  Good appetite, normal energy level, good concentration.  Denied depression, hopeless and anxiety.  Denied withdrawals.  Denied SI.  Denied physical problems.  Denied pain.  Goal is to focus.  Plans to stay focused.  Does have discharge plans. A:  No medications administered to patient this morning.  Emotional support and encouragement given patient. R:  Denied SI and HI, contracts for safety.  Denied A/V hallucinations.  Safety maintained with 15 minute checks.

## 2016-06-12 NOTE — Discharge Summary (Signed)
Physician Discharge Summary Note  Patient:  [name] is an 35 y.o., male MRN:  [id number] DOB:  1981/05/08 Patient phone:  [phone] (home)  Patient address:   [place] [address],  Total Time spent with patient: 30 minutes  Date of Admission:  06/04/2016 Date of Discharge: 06/12/2016  Reason for Admission:  Was homicidal towards mother  Principal Problem: Paranoid schizophrenia [facility]) Discharge Diagnoses: Patient Active Problem List   Diagnosis Date Noted  . Cocaine use disorder, moderate, dependence ([place]) [F14.20] 06/09/2016  . Cannabis use disorder, severe, dependence ([place]) [F12.20] 06/09/2016  . Cocaine dependence with cocaine-induced mood disorder ([place]) [F14.24] 03/19/2016  . Cocaine-induced mood disorder ([place]) [F14.94] 03/03/2016  . Cocaine abuse [F14.10] 03/03/2016  . Suicidal ideation [R45.851]   . Reflux esophagitis [K21.0] 05/24/2013  . Other specified gastritis without mention of hemorrhage [K29.60] 05/24/2013  . Nausea with vomiting [R11.2] 05/23/2013  . Anemia, unspecified [D64.9] 05/23/2013  . Nonspecific abnormal finding in stool contents [R19.5] 05/23/2013  . Paranoid schizophrenia ([place]) [F20.0]   . Ulcerative colitis ([place]) [K51.90]   . SUBSTANCE ABUSE, MULTIPLE [F19.10] 12/31/2007    Past Psychiatric History: see HPI  Past Medical History:  Past Medical History:  Diagnosis Date  . Crohn's disease ([place])   . GI (gastrointestinal bleed) 04/2013  . Multiple myeloma ([place])   . Schizophrenia ([place])   . Sickle cell anemia (HCC)   . Suicide attempt by drug ingestion ([place])   . Ulcerative colitis     Past Surgical History:  Procedure Laterality Date  . ESOPHAGOGASTRODUODENOSCOPY N/A 05/24/2013   Procedure: ESOPHAGOGASTRODUODENOSCOPY (EGD);  Surgeon: Keith Artist, MD;  Location[facility] END[facility]COPY;  Service: Endoscopy;  Laterality: N/A;  . FLEXIBLE SIGMOIDOSCOPY  12/05/2011   Procedure: FLEXIBLE SIGMOIDOSCOPY;  Surgeon: Beryle Beams, MD;   Location: [facility] ENDOSCOPY;  Service: Endoscopy;  Laterality: N/A;  . MANDIBLE FRACTURE SURGERY     Family History:  Family History  Problem Relation Age of Onset  . Liver cancer     Family Psychiatric  History: see HPI Social History:  History  Alcohol Use  . Yes    Comment: quit     History  Drug Use  . Types: Cocaine, Marijuana    Comment: quit 1 mo ago    Social History   Social History  . Marital status: Single    Spouse name: N/A  . Number of children: N/A  . Years of education: GED   Occupational History  . disabled    Social History Main Topics  . Smoking status: Current Every Day Smoker    Packs/day: 0.25    Years: 20.00    Types: Cigarettes  . Smokeless tobacco: Never Used  . Alcohol use Yes     Comment: quit  . Drug use:     Types: Cocaine, Marijuana     Comment: quit 1 mo ago  . Sexual activity: No   Other Topics Concern  . None   Social History Narrative   [name] was recently released from prison, currently living at home with his mother.  He is disabled (due to mental illness?).     Hospital Course:  [name] an 35 y.o.malewith history of schizophrenia, cocaine use, marijuana use who presented to [facility] ED after petitioned by his mother, referred from [place] with HI towards his mother and brother, hallucinations and aggressive behaviors including punching television in the setting of non adherence to medication and cocaine/marijuana use.   [name] was admitted for Paranoid schizophrenia [facility]) and crisis management.  He was treated with Vistaril 25 mg as needed anxiety, Zyprexa 20 mg bedtime mood stabilization.  Medical problems were identified and treated as needed.  Home medications were restarted as appropriate.  Improvement was monitored by observation and [name] daily report of symptom reduction.  Emotional and mental status was monitored by daily self inventory reports completed by [name] and  clinical staff.  Patient reported continued improvement, denied any new concerns.  Patient had been compliant on medications and denied side effects.  Support and encouragement was provided.    Patient encouraged to attend groups to help with recognizing triggers of emotional crises and de-stabilizations.  Patient encouraged to attend group to help identify the positive things in life that would help in dealing with feelings of loss, depression and unhealthy or abusive tendencies.         [name] was evaluated by the treatment team for stability and plans for continued recovery upon discharge.  He was offered further treatment options upon discharge including Residential, Intensive Outpatient and Outpatient treatment. He will follow up with agencies listed below for medication management and counseling.  Encouraged patient to maintain satisfactory support network and home environment.  Advised to adhere to medication compliance and outpatient treatment follow up.  Prescriptions provided.       [name] motivation was an integral factor for scheduling further treatment.  Employment, transportation, bed availability, health status, family support, and any pending legal issues were also considered during hospital stay.  Upon completion of this admission the patient was both mentally and medically stable for discharge denying suicidal/homicidal ideation, auditory/visual/tactile hallucinations, delusional thoughts and paranoia.      Physical Findings: AIMS: Facial and Oral Movements Muscles of Facial Expression: None, normal Lips and Perioral Area: None, normal Jaw: None, normal Tongue: None, normal,Extremity Movements Upper (arms, wrists, hands, fingers): None, normal Lower (legs, knees, ankles, toes): None, normal, Trunk Movements Neck, shoulders, hips: None, normal, Overall Severity Severity of abnormal movements (highest score from questions above): None, normal Incapacitation due to  abnormal movements: None, normal Patient's awareness of abnormal movements (rate only patient's report): No Awareness, Dental Status Current problems with teeth and/or dentures?: No Does patient usually wear dentures?: No  CIWA:  CIWA-Ar Total: 1 COWS:  COWS Total Score: 2  Musculoskeletal: Strength & Muscle Tone: within normal limits Gait & Station: normal Patient leans: N/A  Psychiatric Specialty Exam: Physical Exam  Nursing note and vitals reviewed. Psychiatric: He has a normal mood and affect. His speech is normal and behavior is normal. Judgment normal. Thought content is not paranoid and not delusional. Cognition and memory are normal. He expresses no homicidal and no suicidal ideation.    Review of Systems  Constitutional: Negative.   HENT: Negative.   Eyes: Negative.   Respiratory: Negative.   Cardiovascular: Negative.   Gastrointestinal: Negative.   Genitourinary: Negative.   Musculoskeletal: Negative.   Skin: Negative.   Neurological: Negative.   Endo/Heme/Allergies: Negative.   Psychiatric/Behavioral: Negative.   All other systems reviewed and are negative.   Blood pressure 123/84, pulse 92, temperature 97.9 F (36.6 C), temperature source Oral, resp. rate 18, height 6' (1.829 m), weight 83.5 kg (184 lb).Body mass index is 24.95 kg/m.    Have you used any form of tobacco in the last 30 days? (Cigarettes, Smokeless Tobacco, Cigars, and/or Pipes): Patient Refused Screening  Has this patient used any form  of tobacco in the last 30 days? (Cigarettes, Smokeless Tobacco, Cigars, and/or Pipes) Yes, N/A  Blood Alcohol level:  Lab Results  Component Value Date   ETH <5 06/02/2016   ETH <5 53/97/6734    Metabolic Disorder Labs:  Lab Results  Component Value Date   HGBA1C 5.5 06/06/2016   MPG 111 06/06/2016   MPG 123 (H) 12/13/2011   Lab Results  Component Value Date   PROLACTIN 31.9 (H) 06/06/2016   Lab Results  Component Value Date   CHOL 171 06/06/2016    TRIG 292 (H) 06/06/2016   HDL 53 06/06/2016   CHOLHDL 3.2 06/06/2016   VLDL 58 (H) 06/06/2016   LDLCALC 60 06/06/2016   LDLCALC  12/17/2009    59        Total Cholesterol/HDL:CHD Risk Coronary Heart Disease Risk Table                     Men   Women  1/2 Average Risk   3.4   3.3  Average Risk       5.0   4.4  2 X Average Risk   9.6   7.1  3 X Average Risk  23.4   11.0        Use the calculated Patient Ratio above and the CHD Risk Table to determine the patient's CHD Risk.        ATP III CLASSIFICATION (LDL):  <100     mg/dL   Optimal  100-129  mg/dL   Near or Above                    Optimal  130-159  mg/dL   Borderline  160-189  mg/dL   High  >190     mg/dL   Very High    See Psychiatric Specialty Exam and Suicide Risk Assessment completed by Attending Physician prior to discharge.  Discharge destination:  Home  Is patient on multiple antipsychotic therapies at discharge:  No   Has Patient had three or more failed trials of antipsychotic monotherapy by history:  No  Recommended Plan for Multiple Antipsychotic Therapies: NA     Medication List    TAKE these medications     Indication  hydrOXYzine 25 MG tablet Commonly known as:  ATARAX/VISTARIL Take 1 tablet (25 mg total) by mouth 3 (three) times daily as needed for anxiety.  Indication:  Anxiety Neurosis   OLANZapine 20 MG tablet Commonly known as:  ZYPREXA Take 1 tablet (20 mg total) by mouth at bedtime.  Indication:  Schizophrenia      Follow-up Information    [facility] .   Why:  Cleo from the ACT team will call your mother later today with an appointment time and date. Call them at this number if you have not heard from them by tomorrow afternoon. They should be able to see you by Wednesday at the latest Contact information: [place] [phone]          Follow-up recommendations:  Activity:  as tol Diet:  as tol  Comments:  1.  Take all your  medications as prescribed.   2.  Report any adverse side effects to outpatient provider. 3.  Patient instructed to not use alcohol or illegal drugs while on prescription medicines. 4.  In the event of worsening symptoms, instructed patient to call 911, the crisis hotline or go to nearest emergency room for evaluation of symptoms.  Signed: Janett Labella, NP [facility] 06/12/2016, 2:57 PM

## 2016-06-12 NOTE — BHH Suicide Risk Assessment (Signed)
[place] INPATIENT:  Family/Significant Other Suicide Prevention Education  Suicide Prevention Education:  Education Completed; No one has been identified by the patient as the family member/significant other with whom the patient will be residing, and identified as the person(s) who will aid the patient in the event of a mental health crisis (suicidal ideations/suicide attempt).  With written consent from the patient, the family member/significant other has been provided the following suicide prevention education, prior to the and/or following the discharge of the patient.  The suicide prevention education provided includes the following:  Suicide risk factors  Suicide prevention and interventions  National Suicide Hotline telephone number  [facility] assessment telephone number  [facility] Emergency Assistance [place] and/or Residential Mobile Crisis Unit telephone number  Request made of family/significant other to:  Remove weapons (e.g., guns, rifles, knives), all items previously/currently identified as safety concern.    Remove drugs/medications (over-the-counter, prescriptions, illicit drugs), all items previously/currently identified as a safety concern.  The family member/significant other verbalizes understanding of the suicide prevention education information provided.  The family member/significant other agrees to remove the items of safety concern listed above. The patient did not endorse SI at the time of admission, nor did the patient c/o SI during the stay here.  SPE not required. However, I did talk to mother, [name], [phone], about treatment team recommendations and crises plan.  [place] 06/12/2016, 10:43 AM
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
## 2016-06-17 ENCOUNTER — Emergency Department (HOSPITAL_COMMUNITY)
Admission: EM | Admit: 2016-06-17 | Discharge: 2016-06-18 | Disposition: A | Discharge status: Home / Self Care | Payer: Medicare Other | Attending: Emergency Medicine | Admitting: Emergency Medicine

## 2016-06-17 ENCOUNTER — Encounter (HOSPITAL_COMMUNITY): Payer: Self-pay | Admitting: *Deleted

## 2016-06-17 DIAGNOSIS — Z5181 Encounter for therapeutic drug level monitoring: Secondary | ICD-10-CM | POA: Insufficient documentation

## 2016-06-17 DIAGNOSIS — Z9114 Patient's other noncompliance with medication regimen: Secondary | ICD-10-CM | POA: Diagnosis not present

## 2016-06-17 DIAGNOSIS — F17210 Nicotine dependence, cigarettes, uncomplicated: Secondary | ICD-10-CM | POA: Diagnosis not present

## 2016-06-17 DIAGNOSIS — F1494 Cocaine use, unspecified with cocaine-induced mood disorder: Secondary | ICD-10-CM

## 2016-06-17 DIAGNOSIS — F200 Paranoid schizophrenia: Secondary | ICD-10-CM | POA: Diagnosis not present

## 2016-06-17 DIAGNOSIS — F29 Unspecified psychosis not due to a substance or known physiological condition: Secondary | ICD-10-CM | POA: Insufficient documentation

## 2016-06-17 DIAGNOSIS — Z008 Encounter for other general examination: Secondary | ICD-10-CM

## 2016-06-17 DIAGNOSIS — Z046 Encounter for general psychiatric examination, requested by authority: Secondary | ICD-10-CM | POA: Diagnosis not present

## 2016-06-17 DIAGNOSIS — F1420 Cocaine dependence, uncomplicated: Secondary | ICD-10-CM | POA: Diagnosis present

## 2016-06-17 DIAGNOSIS — F1414 Cocaine abuse with cocaine-induced mood disorder: Secondary | ICD-10-CM | POA: Diagnosis present

## 2016-06-17 LAB — COMPREHENSIVE METABOLIC PANEL
ALT: 59 U/L (ref 17–63)
AST: 40 U/L (ref 15–41)
Albumin: 4.6 g/dL (ref 3.5–5.0)
Alkaline Phosphatase: 93 U/L (ref 38–126)
Anion gap: 9 (ref 5–15)
BUN: 23 mg/dL — AB (ref 6–20)
CHLORIDE: 105 mmol/L (ref 101–111)
CO2: 25 mmol/L (ref 22–32)
CREATININE: 0.92 mg/dL (ref 0.61–1.24)
Calcium: 9 mg/dL (ref 8.9–10.3)
Glucose, Bld: 135 mg/dL — ABNORMAL HIGH (ref 65–99)
POTASSIUM: 3.9 mmol/L (ref 3.5–5.1)
SODIUM: 139 mmol/L (ref 135–145)
Total Bilirubin: 1.4 mg/dL — ABNORMAL HIGH (ref 0.3–1.2)
Total Protein: 8.5 g/dL — ABNORMAL HIGH (ref 6.5–8.1)

## 2016-06-17 LAB — ETHANOL

## 2016-06-17 LAB — CBC
HEMATOCRIT: 43.6 % (ref 39.0–52.0)
HEMOGLOBIN: 14.9 g/dL (ref 13.0–17.0)
MCH: 29.1 pg (ref 26.0–34.0)
MCHC: 34.2 g/dL (ref 30.0–36.0)
MCV: 85.2 fL (ref 78.0–100.0)
Platelets: 306 10*3/uL (ref 150–400)
RBC: 5.12 MIL/uL (ref 4.22–5.81)
RDW: 16.1 % — ABNORMAL HIGH (ref 11.5–15.5)
WBC: 12.8 10*3/uL — AB (ref 4.0–10.5)

## 2016-06-17 MED ORDER — ACETAMINOPHEN 325 MG PO TABS: 650.0000 mg | ORAL_TABLET | ORAL | Status: DC | PRN

## 2016-06-17 MED ORDER — ALUM & MAG HYDROXIDE-SIMETH 200-200-20 MG/5ML PO SUSP: 30.0000 mL | ORAL | Status: DC | PRN

## 2016-06-17 MED ORDER — LORAZEPAM 1 MG PO TABS: 1.0000 mg | ORAL_TABLET | Freq: Three times a day (TID) | ORAL | Status: DC | PRN

## 2016-06-17 MED ORDER — ONDANSETRON HCL 4 MG PO TABS: 4.0000 mg | ORAL_TABLET | Freq: Three times a day (TID) | ORAL | Status: DC | PRN

## 2016-06-17 MED ORDER — OLANZAPINE 10 MG PO TBDP
20.0000 mg | ORAL_TABLET | Freq: Once | ORAL | Status: AC
  Administered 2016-06-17: 20 mg via ORAL
  Filled 2016-06-17: qty 2

## 2016-06-17 NOTE — ED Notes (Signed)
PT ambulatory w/o difficulty to room 35

## 2016-06-17 NOTE — ED Notes (Signed)
In room

## 2016-06-17 NOTE — ED Notes (Signed)
Patient noted sleeping in room. No complaints, stable, in no acute distress. Q15 minute rounds and monitoring via [employer] to continue.

## 2016-06-17 NOTE — ED Notes (Addendum)
Mom called and reports that he was just dc'd Monday, and is not taking his medications.  Sh reports that she filled them Tuesday and when she checked there was only one missing.  She report that he can get violent with her and others and she is afraid that he will get hurt of hurt some one else.  Pt is suppose to be followed  By a ACT team,  But he has not been seen.  She is not sure of who the group was. Mom reports that she is his POA (verified w/ pt), requested that she bring the papers when she comes

## 2016-06-17 NOTE — ED Notes (Signed)
On the phone

## 2016-06-17 NOTE — ED Notes (Signed)
Patient noted in room. No complaints, stable, in no acute distress. Q15 minute rounds and monitoring via [employer] to continue.

## 2016-06-17 NOTE — ED Notes (Signed)
Pt sleeping, arousable, oriented x3
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
## 2016-06-17 NOTE — Progress Notes (Signed)
Pt got my attn from door. During our visit he spoke of his mom and said I reminded me of her. When asked about his mother, he said she was on drugs and described a relationship that seemed difficult for him. Pt told me where he lives and that I should wave and speak when I come by. Please page if additional support is needed. Chaplain Ernest Haber, M.Div.   06/17/16 1300  Clinical Encounter Type  Visited With Patient

## 2016-06-17 NOTE — ED Triage Notes (Signed)
Pt sleeping at present, report called to [address], RN, pt to go to room 35
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
## 2016-06-17 NOTE — BH Assessment (Signed)
Patient does not meet inpatient criteria per Dr. Louretta Shorten who recommends rescinding IVC. EDP Dr little does not feel comfortable rescinding patients IVC - patient to remain overnight to see psychiatry in the morning.   Rosalin Hawking, LCSW Therapeutic Triage Specialist [place] 06/17/2016 5:28 PM

## 2016-06-17 NOTE — ED Notes (Signed)
Consulted with Serena Colonel NP concerning pts. Request for antihistamine ref. Sinus congestion.

## 2016-06-17 NOTE — BH Assessment (Signed)
Contacted patients ACT Team with [facility] per provider request to assess patient for baseline behavior and possible discharge.  ACT Team has not yet met with patient, he had a scheduled intake and missed that appointment.   Rosalin Hawking, LCSW Therapeutic Triage Specialist [place] 06/17/2016 3:13 PM
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
## 2016-06-17 NOTE — ED Notes (Addendum)
Report to include Situation, Background, Assessment, and Recommendations received from [place]. Patient alert and oriented, warm and dry, in no acute distress. Patient denies SI, HI, AVH and pain. Patient made aware of Q15 minute rounds and security cameras for their safety. Patient instructed to come to me with needs or concerns.

## 2016-06-18 ENCOUNTER — Encounter (HOSPITAL_COMMUNITY): Payer: Self-pay | Admitting: Registered Nurse

## 2016-06-18 DIAGNOSIS — F1494 Cocaine use, unspecified with cocaine-induced mood disorder: Secondary | ICD-10-CM | POA: Diagnosis not present

## 2016-06-18 DIAGNOSIS — Z9114 Patient's other noncompliance with medication regimen: Secondary | ICD-10-CM | POA: Insufficient documentation

## 2016-06-18 DIAGNOSIS — F17210 Nicotine dependence, cigarettes, uncomplicated: Secondary | ICD-10-CM

## 2016-06-18 DIAGNOSIS — F200 Paranoid schizophrenia: Secondary | ICD-10-CM

## 2016-06-18 LAB — RAPID URINE DRUG SCREEN, HOSP PERFORMED
AMPHETAMINES: NOT DETECTED
BARBITURATES: NOT DETECTED
BENZODIAZEPINES: NOT DETECTED
COCAINE: POSITIVE — AB
Opiates: NOT DETECTED
TETRAHYDROCANNABINOL: POSITIVE — AB

## 2016-06-18 NOTE — ED Notes (Addendum)
Dr Cathe Mons NP into see.  PT reports that he has been taking his meds, and that he not been hearing voices.

## 2016-06-18 NOTE — ED Notes (Signed)
Up to the bathroom 

## 2016-06-18 NOTE — ED Notes (Signed)
On the phone

## 2016-06-18 NOTE — ED Notes (Addendum)
Pt admits that he had just called his mother, but denies that he threatened to kill her.  Pt continues to deny AVH.  Pt instructed not to call his mother, Dr Lewayne Bunting NP and TTS aware of mothers statement.   TTS to contact pt's mother

## 2016-06-18 NOTE — [facility] Suicide Risk Assessment (Cosign Needed)
Suicide Risk Assessment  Discharge Assessment   [facility] Discharge Suicide Risk Assessment   Principal Problem: Paranoid schizophrenia [facility]) Discharge Diagnoses:  Patient Active Problem List   Diagnosis Date Noted  . Non compliance w medication regimen [Z91.14]   . Cocaine use disorder, moderate, dependence ([place]) [F14.20] 06/09/2016  . Cannabis use disorder, severe, dependence ([place]) [F12.20] 06/09/2016  . Cocaine dependence with cocaine-induced mood disorder ([place]) [F14.24] 03/19/2016  . Cocaine-induced mood disorder ([place]) [F14.94] 03/03/2016  . Cocaine abuse [F14.10] 03/03/2016  . Suicidal ideation [R45.851]   . Reflux esophagitis [K21.0] 05/24/2013  . Other specified gastritis without mention of hemorrhage [K29.60] 05/24/2013  . Nausea with vomiting [R11.2] 05/23/2013  . Anemia, unspecified [D64.9] 05/23/2013  . Nonspecific abnormal finding in stool contents [R19.5] 05/23/2013  . Paranoid schizophrenia ([place]) [F20.0]   . Ulcerative colitis ([place]) [K51.90]   . SUBSTANCE ABUSE, MULTIPLE [F19.10] 12/31/2007    Total Time spent with patient: 45 minutes  Musculoskeletal: Strength & Muscle Tone: within normal limits Gait & Station: normal Patient leans: N/A  Psychiatric Specialty Exam:   Blood pressure 122/69, pulse 80, temperature 98.3 F (36.8 C), temperature source Oral, resp. rate 18, height 6' (1.829 m), weight 83.5 kg (184 lb), SpO2 99 %.Body mass index is 24.95 kg/m.   General Appearance: Casual  Eye Contact:  Good  Speech:  Normal Rate  Volume:  Normal  Mood:  "Good"  Affect:  Congruent  Thought Process:  Coherent  Orientation:  Full (Time, Place, and Person)  Thought Content:  Logical and Denies hallucinations, delusions, and paranoia  Suicidal Thoughts:  No  Homicidal Thoughts:  No  Memory:  Immediate;   Fair Recent;   Fair Remote;   Fair  Judgement:  Fair  Insight:  Fair  Psychomotor Activity:  Normal  Concentration:  Concentration: Fair and Attention Span: Fair   Recall:  [employer] of Knowledge:  Fair  Language:  Fair  Akathisia:  No  Handed:  Right  AIMS (if indicated):     Assets:  Desire for Improvement Housing Social Support  ADL's:  Intact  Cognition:  WNL  Sleep:        Mental Status Per Nursing Assessment::   On Admission:   Refusing medication; psychotic evaluation  Demographic Factors:  Male  Loss Factors: NA  Historical Factors: Impulsivity  Risk Reduction Factors:   Living with another person, especially a relative and Positive social support  Continued Clinical Symptoms:  Schizophrenia:   Less than 92 years old  Cognitive Features That Contribute To Risk:  None    Suicide Risk:  Minimal: No identifiable suicidal ideation.  Patients presenting with no risk factors but with morbid ruminations; may be classified as minimal risk based on the severity of the depressive symptoms    Plan Of Care/Follow-up recommendations:  Activity:  As tolerated Diet:  As tolerated  Rankin, Shuvon, NP 06/18/2016, 2:56 PM

## 2016-06-18 NOTE — ED Notes (Signed)
Patient noted sleeping in room. No complaints, stable, in no acute distress. Q15 minute rounds and monitoring via [employer] to continue.

## 2016-06-18 NOTE — BH Assessment (Signed)
[place] Assessment Progress Note This writer attempted to contact patient's mother Early Steel [phone]) this date to gather collateral although mother was unable to be reached.

## 2016-06-18 NOTE — ED Notes (Signed)
In dayroom talking w/ dr. Lenna Sciara

## 2016-06-18 NOTE — ED Notes (Signed)
In day room watching tv
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
## 2016-06-18 NOTE — ED Notes (Signed)
Written dc instructions reviewed with pt.  Pt denies si/hi/avh at this time and reports that he is going to stay with his grandmother.  Pt encouraged take his medications as directed and contact his ACT team tomorrow to schedule his visit.  Pt encouraged to seek treatment for return of si/hi/avh.  Pt verbalized understanding and reported he would.

## 2016-06-18 NOTE — ED Notes (Addendum)
IVC has been rescinded by Dr Louretta Shorten per [facility] NP.  Dr Louretta Shorten left prior to signing and will return to sign form.

## 2016-06-18 NOTE — ED Notes (Addendum)
Pt ambulatory w/o diifficulty to dc window w/ mHt, belongings returned after leaving the area.
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
## 2016-08-20 ENCOUNTER — Encounter (HOSPITAL_COMMUNITY): Payer: Self-pay

## 2016-08-20 ENCOUNTER — Emergency Department (HOSPITAL_COMMUNITY)
Admission: EM | Admit: 2016-08-20 | Discharge: 2016-08-20 | Disposition: A | Discharge status: Home / Self Care | Payer: Medicare Other | Attending: Emergency Medicine | Admitting: Emergency Medicine

## 2016-08-20 ENCOUNTER — Emergency Department (HOSPITAL_COMMUNITY): Payer: Medicare Other

## 2016-08-20 DIAGNOSIS — S99911A Unspecified injury of right ankle, initial encounter: Secondary | ICD-10-CM | POA: Diagnosis present

## 2016-08-20 DIAGNOSIS — M25571 Pain in right ankle and joints of right foot: Secondary | ICD-10-CM | POA: Diagnosis not present

## 2016-08-20 DIAGNOSIS — Y929 Unspecified place or not applicable: Secondary | ICD-10-CM | POA: Insufficient documentation

## 2016-08-20 DIAGNOSIS — Y999 Unspecified external cause status: Secondary | ICD-10-CM | POA: Diagnosis not present

## 2016-08-20 DIAGNOSIS — Y939 Activity, unspecified: Secondary | ICD-10-CM | POA: Insufficient documentation

## 2016-08-20 DIAGNOSIS — M7989 Other specified soft tissue disorders: Secondary | ICD-10-CM | POA: Diagnosis not present

## 2016-08-20 DIAGNOSIS — F17210 Nicotine dependence, cigarettes, uncomplicated: Secondary | ICD-10-CM | POA: Insufficient documentation

## 2016-08-20 DIAGNOSIS — S93401A Sprain of unspecified ligament of right ankle, initial encounter: Secondary | ICD-10-CM | POA: Diagnosis not present

## 2016-08-20 DIAGNOSIS — X509XXA Other and unspecified overexertion or strenuous movements or postures, initial encounter: Secondary | ICD-10-CM | POA: Diagnosis not present

## 2016-08-20 MED ORDER — OXYCODONE-ACETAMINOPHEN 5-325 MG PO TABS
2.0000 | ORAL_TABLET | Freq: Once | ORAL | Status: AC
  Administered 2016-08-20: 2 via ORAL
  Filled 2016-08-20: qty 2

## 2016-08-20 NOTE — ED Provider Notes (Signed)
[facility] DEPT Provider Note   CSN: [id number] Arrival date & time: 08/20/16  1718     History   Chief Complaint Chief Complaint  Patient presents with  . Ankle Injury    HPI [name] is a 35 y.o. male.Who presents today complaining of right ankle pain after twisting his ankle yesterday getting out of a car. Pain is moderate in nature and more on the medial aspect of the ankle. He has diffuse swelling. He states is worse with standing. He has not taken any medications  HPI  Past Medical History:  Diagnosis Date  . Crohn's disease ([place])   . GI (gastrointestinal bleed) 04/2013  . Multiple myeloma ([place])   . Schizophrenia ([place])   . Sickle cell anemia (HCC)   . Suicide attempt by drug ingestion ([place])   . Ulcerative colitis     Patient Active Problem List   Diagnosis Date Noted  . Non compliance w medication regimen   . Cocaine use disorder, moderate, dependence ([place]) 06/09/2016  . Cannabis use disorder, severe, dependence ([place]) 06/09/2016  . Cocaine dependence with cocaine-induced mood disorder ([place]) 03/19/2016  . Cocaine-induced mood disorder ([place]) 03/03/2016  . Cocaine abuse 03/03/2016  . Suicidal ideation   . Reflux esophagitis 05/24/2013  . Other specified gastritis without mention of hemorrhage 05/24/2013  . Nausea with vomiting 05/23/2013  . Anemia, unspecified 05/23/2013  . Nonspecific abnormal finding in stool contents 05/23/2013  . Paranoid schizophrenia ([place])   . Ulcerative colitis ([place])   . SUBSTANCE ABUSE, MULTIPLE 12/31/2007    Past Surgical History:  Procedure Laterality Date  . ESOPHAGOGASTRODUODENOSCOPY N/A 05/24/2013   Procedure: ESOPHAGOGASTRODUODENOSCOPY (EGD);  Surgeon: Ladene Artist, MD;  Location: [facility] ENDOSCOPY;  Service: Endoscopy;  Laterality: N/A;  . FLEXIBLE SIGMOIDOSCOPY  12/05/2011   Procedure: FLEXIBLE SIGMOIDOSCOPY;  Surgeon: Beryle Beams, MD;  Location: [facility] ENDOSCOPY;  Service: Endoscopy;  Laterality: N/A;  . MANDIBLE  FRACTURE SURGERY         Home Medications    Prior to Admission medications   Medication Sig Start Date End Date Taking? Authorizing Provider  hydrOXYzine (ATARAX/VISTARIL) 25 MG tablet Take 1 tablet (25 mg total) by mouth 3 (three) times daily as needed for anxiety. Patient not taking: Reported on 06/17/2016 06/12/16   Kerrie Buffalo, NP  OLANZapine (ZYPREXA) 20 MG tablet Take 1 tablet (20 mg total) by mouth at bedtime. Patient not taking: Reported on 06/17/2016 06/12/16   Kerrie Buffalo, NP    Family History Family History  Problem Relation Age of Onset  . Liver cancer      Social History Social History  Substance Use Topics  . Smoking status: Current Every Day Smoker    Packs/day: 0.25    Years: 20.00    Types: Cigarettes  . Smokeless tobacco: Never Used  . Alcohol use Yes     Comment: quit     Allergies   Depakote [divalproex sodium]; Lithium; Nsaids; Risperdal [risperidone]; and Nsaids   Review of Systems Review of Systems  All other systems reviewed and are negative.    Physical Exam Updated Vital Signs BP 129/77 (BP Location: Left Arm)   Pulse 95   Temp 98.2 F (36.8 C) (Oral)   Resp 18   SpO2 99%   Physical Exam  Constitutional: He appears well-nourished. No distress.  Obese  HENT:  Head: Normocephalic and atraumatic.  Musculoskeletal:       Feet:  Nursing note and vitals reviewed.    ED Treatments /  Results  Labs (all labs ordered are listed, but only abnormal results are displayed) Labs Reviewed - No data to display  EKG  EKG Interpretation None       Radiology Dg Ankle Complete Right  Result Date: 08/20/2016 CLINICAL DATA:  Twisted ankle yesterday. Persistent pain and swelling. EXAM: RIGHT ANKLE - COMPLETE 3+ VIEW COMPARISON:  Radiographs 11/20/2015 FINDINGS: The ankle mortise is maintained. No acute ankle fracture is identified. Remote fracture noted at the base of the fifth metatarsal. Small calcaneal heel spurs are noted.  Moderate midfoot degenerative changes with dorsal spurring. IMPRESSION: No acute ankle fracture. Electronically Signed   By: Marijo Sanes M.D.   On: 08/20/2016 18:48    Procedures Procedures (including critical care time)  Medications Ordered in ED Medications - No data to display   Initial Impression / Assessment and Plan / ED Course  I have reviewed the triage vital signs and the nursing notes.  Pertinent labs & imaging results that were available during my care of the patient were reviewed by me and considered in my medical decision making (see chart for details).  Clinical Course    Patient with injury c.w. Ankle sprain. Plan ace wrap, crutches and follow up with pmd.   Final Clinical Impressions(s) / ED Diagnoses   Final diagnoses:  Acute right ankle pain    New Prescriptions New Prescriptions   No medications on file     Keith Boss, MD 08/20/16 2039

## 2016-08-20 NOTE — ED Triage Notes (Signed)
Patient here with rightt ankle pain x 1 day after twisting ankle. Pain with ambulation, no obvious deformity

## 2016-12-02 ENCOUNTER — Encounter (HOSPITAL_COMMUNITY): Payer: Self-pay | Admitting: *Deleted

## 2016-12-02 ENCOUNTER — Emergency Department (HOSPITAL_COMMUNITY)
Admission: EM | Admit: 2016-12-02 | Discharge: 2016-12-05 | Disposition: A | Discharge status: Home / Self Care | Payer: Medicare Other | Attending: Emergency Medicine | Admitting: Emergency Medicine

## 2016-12-02 DIAGNOSIS — F1290 Cannabis use, unspecified, uncomplicated: Secondary | ICD-10-CM | POA: Diagnosis not present

## 2016-12-02 DIAGNOSIS — F1414 Cocaine abuse with cocaine-induced mood disorder: Secondary | ICD-10-CM | POA: Diagnosis present

## 2016-12-02 DIAGNOSIS — F1494 Cocaine use, unspecified with cocaine-induced mood disorder: Secondary | ICD-10-CM

## 2016-12-02 DIAGNOSIS — F200 Paranoid schizophrenia: Secondary | ICD-10-CM | POA: Diagnosis not present

## 2016-12-02 DIAGNOSIS — Z79899 Other long term (current) drug therapy: Secondary | ICD-10-CM | POA: Diagnosis not present

## 2016-12-02 DIAGNOSIS — F17210 Nicotine dependence, cigarettes, uncomplicated: Secondary | ICD-10-CM | POA: Diagnosis not present

## 2016-12-02 DIAGNOSIS — Z9114 Patient's other noncompliance with medication regimen: Secondary | ICD-10-CM | POA: Diagnosis not present

## 2016-12-02 DIAGNOSIS — R45851 Suicidal ideations: Secondary | ICD-10-CM | POA: Diagnosis present

## 2016-12-02 DIAGNOSIS — F1490 Cocaine use, unspecified, uncomplicated: Secondary | ICD-10-CM | POA: Diagnosis not present

## 2016-12-02 DIAGNOSIS — F1424 Cocaine dependence with cocaine-induced mood disorder: Secondary | ICD-10-CM

## 2016-12-02 LAB — COMPREHENSIVE METABOLIC PANEL
ALK PHOS: 76 U/L (ref 38–126)
ALT: 24 U/L (ref 17–63)
ANION GAP: 10 (ref 5–15)
AST: 28 U/L (ref 15–41)
Albumin: 4 g/dL (ref 3.5–5.0)
BUN: 12 mg/dL (ref 6–20)
CALCIUM: 8.9 mg/dL (ref 8.9–10.3)
CO2: 24 mmol/L (ref 22–32)
CREATININE: 0.81 mg/dL (ref 0.61–1.24)
Chloride: 106 mmol/L (ref 101–111)
Glucose, Bld: 149 mg/dL — ABNORMAL HIGH (ref 65–99)
Potassium: 3.9 mmol/L (ref 3.5–5.1)
Sodium: 140 mmol/L (ref 135–145)
TOTAL PROTEIN: 7.6 g/dL (ref 6.5–8.1)
Total Bilirubin: 0.6 mg/dL (ref 0.3–1.2)

## 2016-12-02 LAB — RAPID URINE DRUG SCREEN, HOSP PERFORMED
Amphetamines: NOT DETECTED
Barbiturates: NOT DETECTED
Benzodiazepines: NOT DETECTED
Cocaine: POSITIVE — AB
OPIATES: NOT DETECTED
TETRAHYDROCANNABINOL: NOT DETECTED

## 2016-12-02 LAB — ACETAMINOPHEN LEVEL: Acetaminophen (Tylenol), Serum: <10 ug/mL — ABNORMAL LOW (ref 10–30)

## 2016-12-02 LAB — CBC
HCT: 44.8 % (ref 39.0–52.0)
Hemoglobin: 15.1 g/dL (ref 13.0–17.0)
MCH: 29.3 pg (ref 26.0–34.0)
MCHC: 33.7 g/dL (ref 30.0–36.0)
MCV: 86.8 fL (ref 78.0–100.0)
PLATELETS: 270 10*3/uL (ref 150–400)
RBC: 5.16 MIL/uL (ref 4.22–5.81)
RDW: 15.1 % (ref 11.5–15.5)
WBC: 9.6 10*3/uL (ref 4.0–10.5)

## 2016-12-02 LAB — ETHANOL

## 2016-12-02 LAB — SALICYLATE LEVEL

## 2016-12-02 MED ORDER — LORAZEPAM 1 MG PO TABS
0.0000 mg | ORAL_TABLET | Freq: Four times a day (QID) | ORAL | Status: DC
  Administered 2016-12-02: 1 mg via ORAL
  Filled 2016-12-02: qty 1

## 2016-12-02 MED ORDER — ONDANSETRON HCL 4 MG PO TABS: 4.0000 mg | ORAL_TABLET | Freq: Three times a day (TID) | ORAL | Status: DC | PRN

## 2016-12-02 MED ORDER — LORAZEPAM 1 MG PO TABS: 0.0000 mg | ORAL_TABLET | Freq: Two times a day (BID) | ORAL | Status: DC

## 2016-12-02 MED ORDER — NICOTINE 21 MG/24HR TD PT24
21.0000 mg | MEDICATED_PATCH | Freq: Every day | TRANSDERMAL | Status: DC
  Administered 2016-12-02: 21 mg via TRANSDERMAL
  Filled 2016-12-02 (×2): qty 1

## 2016-12-02 MED ORDER — ALUM & MAG HYDROXIDE-SIMETH 200-200-20 MG/5ML PO SUSP: 30.0000 mL | ORAL | Status: DC | PRN
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
## 2016-12-02 NOTE — ED Triage Notes (Signed)
Patient IVC'd by Mother. Patient has been hiding medications, talking to himself, Patient talks about killing himself, patient hallucinating, and also has been saying that he would kill anyone that comes in his house.

## 2016-12-02 NOTE — ED Notes (Signed)
I accidentally charted on this patient from Charlyne Petrin, NT's computer login. Please excuse.

## 2016-12-03 DIAGNOSIS — F1290 Cannabis use, unspecified, uncomplicated: Secondary | ICD-10-CM | POA: Diagnosis not present

## 2016-12-03 DIAGNOSIS — Z9114 Patient's other noncompliance with medication regimen: Secondary | ICD-10-CM

## 2016-12-03 DIAGNOSIS — F1494 Cocaine use, unspecified with cocaine-induced mood disorder: Secondary | ICD-10-CM | POA: Diagnosis not present

## 2016-12-03 DIAGNOSIS — R45851 Suicidal ideations: Secondary | ICD-10-CM | POA: Diagnosis not present

## 2016-12-03 DIAGNOSIS — F17210 Nicotine dependence, cigarettes, uncomplicated: Secondary | ICD-10-CM

## 2016-12-03 DIAGNOSIS — Z79899 Other long term (current) drug therapy: Secondary | ICD-10-CM

## 2016-12-03 DIAGNOSIS — Z888 Allergy status to other drugs, medicaments and biological substances status: Secondary | ICD-10-CM

## 2016-12-03 DIAGNOSIS — F200 Paranoid schizophrenia: Secondary | ICD-10-CM | POA: Diagnosis not present

## 2016-12-03 MED ORDER — OLANZAPINE 10 MG PO TABS
20.0000 mg | ORAL_TABLET | Freq: Every day | ORAL | Status: DC
  Administered 2016-12-03 – 2016-12-04 (×2): 20 mg via ORAL
  Filled 2016-12-03 (×2): qty 2

## 2016-12-03 MED ORDER — BENZTROPINE MESYLATE 1 MG PO TABS
1.0000 mg | ORAL_TABLET | Freq: Two times a day (BID) | ORAL | Status: DC
  Administered 2016-12-03 – 2016-12-04 (×3): 1 mg via ORAL
  Filled 2016-12-03 (×4): qty 1

## 2016-12-03 MED ORDER — ACETAMINOPHEN 325 MG PO TABS
650.0000 mg | ORAL_TABLET | Freq: Four times a day (QID) | ORAL | Status: DC | PRN
  Administered 2016-12-03 – 2016-12-04 (×3): 650 mg via ORAL
  Filled 2016-12-03 (×3): qty 2

## 2016-12-03 NOTE — ED Notes (Signed)
Report to include situation, background, assessment and recommendations from [facility]. Patient sleeping, respirations regular and unlabored. Q15 minute rounds and security camera observation to continue.
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
## 2016-12-03 NOTE — ED Notes (Signed)
Hourly rounding reveals patient sleeping in room. No complaints, stable, in no acute distress. Q15 minute rounds and monitoring via Security Cameras to continue. 

## 2016-12-03 NOTE — Progress Notes (Signed)
CSW filed patient's examination paperwork into IVC logbook.

## 2016-12-03 NOTE — ED Notes (Signed)
Pt. Transferred to [facility] from ED to room after screening for contraband. Report to include Situation, Background, Assessment and Recommendations from TCU RN. Pt. Oriented to unit including Q15 minute rounds as well as the security cameras for their protection. Patient is alert and oriented, warm and dry in no acute distress. Patient denies HI, and VH. Pt. Encouraged to let me know if needs arise.

## 2016-12-03 NOTE — ED Notes (Signed)
Hourly rounding reveals patient in room. No complaints, stable, in no acute distress. Q15 minute rounds and monitoring via Security Cameras to continue. 

## 2016-12-03 NOTE — ED Notes (Signed)
Pt is pleasant and cooperative.  He appears very depressed and says he has a suicide plan of jumping in traffic, however he contracts for safety.   15 minute checks and video monitoring in place.

## 2016-12-04 DIAGNOSIS — F200 Paranoid schizophrenia: Secondary | ICD-10-CM

## 2016-12-04 DIAGNOSIS — F1290 Cannabis use, unspecified, uncomplicated: Secondary | ICD-10-CM | POA: Diagnosis not present

## 2016-12-04 DIAGNOSIS — F17210 Nicotine dependence, cigarettes, uncomplicated: Secondary | ICD-10-CM | POA: Diagnosis not present

## 2016-12-04 DIAGNOSIS — F1490 Cocaine use, unspecified, uncomplicated: Secondary | ICD-10-CM | POA: Diagnosis not present

## 2016-12-04 DIAGNOSIS — Z79899 Other long term (current) drug therapy: Secondary | ICD-10-CM | POA: Diagnosis not present

## 2016-12-04 MED ORDER — OLANZAPINE 10 MG PO TABS
10.0000 mg | ORAL_TABLET | Freq: Every day | ORAL | Status: DC
  Administered 2016-12-04: 10 mg via ORAL
  Filled 2016-12-04 (×2): qty 1

## 2016-12-04 NOTE — ED Notes (Signed)
Hourly rounding reveals patient sleeping in room. No complaints, stable, in no acute distress. Q15 minute rounds and monitoring via Security Cameras to continue. 

## 2016-12-04 NOTE — Consult Note (Signed)
[name] Consult   Reason for Consult:  Suicidal/homicidal ideations Referring Physician:  EDP Patient Identification: [name] MRN:  [id number] Principal Diagnosis: Paranoid schizophrenia [facility]) Diagnosis:   Patient Active Problem List   Diagnosis Date Noted  . Cocaine dependence with cocaine-induced mood disorder ([name]) [F14.24] 03/19/2016    Priority: High  . Cocaine-induced mood disorder ([name]) [F14.94] 03/03/2016    Priority: High  . Cocaine abuse [F14.10] 03/03/2016    Priority: High  . Paranoid schizophrenia ([name]) [F20.0]     Priority: High  . Non compliance w medication regimen [Z91.14]   . Cocaine use disorder, moderate, dependence ([name]) [F14.20] 06/09/2016  . Cannabis use disorder, severe, dependence ([name]) [F12.20] 06/09/2016  . Suicidal ideation [R45.851]   . Reflux esophagitis [K21.0] 05/24/2013  . Other specified gastritis without mention of hemorrhage [K29.60] 05/24/2013  . Nausea with vomiting [R11.2] 05/23/2013  . Anemia, unspecified [D64.9] 05/23/2013  . Nonspecific abnormal finding in stool contents [R19.5] 05/23/2013  . Ulcerative colitis ([name]) [K51.90]   . SUBSTANCE ABUSE, MULTIPLE [F19.10] 12/31/2007    Total Time spent with patient: 30 minutes  Subjective:   [name] is a 36 y.o. male patient admitted with psychosis.  HPI:  36 yo male who presented to the ED with noncompliance of medications and suicidal/homicidal ideations.  Reports using cocaine "once in a blue moon" but not taking his psychiatric medications.  Now, however, he is agreeable to an injection.  No suicidal/homicidal ideations, hallucinations, or withdrawal symptoms.  Planning to start injectable antipsychotic and consider discharge tomorrow.  Past Psychiatric History: schizophrenia, drug abuse  Risk to Self: Suicidal Ideation: Yes-Currently Present Suicidal Intent: Yes-Currently Present Is patient at risk for suicide?: Yes Suicidal Plan?: Yes-Currently  Present Specify Current Suicidal Plan: Use a machete Access to Means: No What has been your use of drugs/alcohol within the last 12 months?: Cocaine How many times?:  ("I can't count.") Other Self Harm Risks: Cutting Triggers for Past Attempts: Unpredictable Intentional Self Injurious Behavior: Cutting (Can't remember last time cutting) Comment - Self Injurious Behavior: Can not recall last time. Risk to Others: Homicidal Ideation: No Thoughts of Harm to Others: No Current Homicidal Intent: No Current Homicidal Plan: No Access to Homicidal Means: No Identified Victim: no one History of harm to others?: No Assessment of Violence: None Noted Violent Behavior Description: Pt denies Does patient have access to weapons?: No Criminal Charges Pending?: Yes Describe Pending Criminal Charges: Possession of cocaine Does patient have a court date: Yes Court Date: 12/04/16 Prior Inpatient Therapy: Prior Inpatient Therapy: Yes Prior Therapy Dates: August 2017 Prior Therapy Facilty/Provider(s): [facility] Reason for Treatment: psychosis Prior Outpatient Therapy: Prior Outpatient Therapy: Yes Prior Therapy Dates: Current to last 2 months Prior Therapy Facilty/Provider(s): [facility] Reason for Treatment: med management Does patient have an ACCT team?: Yes (Pt does not know who his ACTT team is.) Does patient have Intensive In-House Services?  : No Does patient have [facility] services? : No Does patient have P4CC services?: No  Past Medical History:  Past Medical History:  Diagnosis Date  . Crohn's disease ([place])   . GI (gastrointestinal bleed) 04/2013  . Multiple myeloma ([place])   . Schizophrenia ([place])   . Sickle cell anemia ([facility])   . Suicide attempt by drug ingestion ([name])   . Ulcerative colitis     Past Surgical History:  Procedure Laterality Date  . ESOPHAGOGASTRODUODENOSCOPY N/A 05/24/2013   Procedure: ESOPHAGOGASTRODUODENOSCOPY (EGD);  Surgeon: Ladene Artist, MD;  Location[facility]  ENDOSCOPY;   Service: Endoscopy;  Laterality: N/A;  . FLEXIBLE SIGMOIDOSCOPY  12/05/2011   Procedure: FLEXIBLE SIGMOIDOSCOPY;  Surgeon: Beryle Beams, MD;  Location: [facility] ENDOSCOPY;  Service: Endoscopy;  Laterality: N/A;  . MANDIBLE FRACTURE SURGERY     Family History:  Family History  Problem Relation Age of Onset  . Liver cancer     Family Psychiatric  History: unknown Social History:  History  Alcohol Use  . Yes    Comment: quit     History  Drug Use  . Types: Cocaine, Marijuana    Comment: quit 1 mo ago    Social History   Social History  . Marital status: Single    Spouse name: N/A  . Number of children: N/A  . Years of education: GED   Occupational History  . disabled    Social History Main Topics  . Smoking status: Current Every Day Smoker    Packs/day: 0.25    Years: 20.00    Types: Cigarettes  . Smokeless tobacco: Never Used  . Alcohol use Yes     Comment: quit  . Drug use: Yes    Types: Cocaine, Marijuana     Comment: quit 1 mo ago  . Sexual activity: No   Other Topics Concern  . None   Social History Narrative   Mr. [name] was recently released from prison, currently living at home with his mother.  He is disabled (due to mental illness?).    Additional Social History:    Allergies:   Allergies  Allergen Reactions  . Depakote [Divalproex Sodium] Other (See Comments)    Reaction:  Seizures   . Lithium Diarrhea and Other (See Comments)    Reaction:  Rectal bleeding   . Nsaids Other (See Comments)    Reaction:  Rectal bleeding   . Risperdal [Risperidone] Shortness Of Breath and Other (See Comments)    Reaction:  Chest pain   . Nsaids Itching    Labs:  Results for orders placed or performed during the hospital encounter of 12/02/16 (from the past 48 hour(s))  Rapid urine drug screen (hospital performed)     Status: Abnormal   Collection Time: 12/02/16  6:10 PM  Result Value Ref Range   Opiates NONE DETECTED NONE DETECTED   Cocaine POSITIVE (A) NONE  DETECTED   Benzodiazepines NONE DETECTED NONE DETECTED   Amphetamines NONE DETECTED NONE DETECTED   Tetrahydrocannabinol NONE DETECTED NONE DETECTED   Barbiturates NONE DETECTED NONE DETECTED    Comment:        DRUG SCREEN FOR MEDICAL PURPOSES ONLY.  IF CONFIRMATION IS NEEDED FOR ANY PURPOSE, NOTIFY LAB WITHIN 5 DAYS.        LOWEST DETECTABLE LIMITS FOR URINE DRUG SCREEN Drug Class       Cutoff (ng/mL) Amphetamine      1000 Barbiturate      200 Benzodiazepine   161 Tricyclics       096 Opiates          300 Cocaine          300 THC              50   Comprehensive metabolic panel     Status: Abnormal   Collection Time: 12/02/16  6:52 PM  Result Value Ref Range   Sodium 140 135 - 145 mmol/L   Potassium 3.9 3.5 - 5.1 mmol/L   Chloride 106 101 - 111 mmol/L   CO2 24 22 - 32 mmol/L  Glucose, Bld 149 (H) 65 - 99 mg/dL   BUN 12 6 - 20 mg/dL   Creatinine, Ser 0.81 0.61 - 1.24 mg/dL   Calcium 8.9 8.9 - 10.3 mg/dL   Total Protein 7.6 6.5 - 8.1 g/dL   Albumin 4.0 3.5 - 5.0 g/dL   AST 28 15 - 41 U/L   ALT 24 17 - 63 U/L   Alkaline Phosphatase 76 38 - 126 U/L   Total Bilirubin 0.6 0.3 - 1.2 mg/dL   GFR calc non Af Amer >60 >60 mL/min   GFR calc Af Amer >60 >60 mL/min    Comment: (NOTE) The eGFR has been calculated using the CKD EPI equation. This calculation has not been validated in all clinical situations. eGFR's persistently <60 mL/min signify possible Chronic Kidney Disease.    Anion gap 10 5 - 15  Ethanol     Status: None   Collection Time: 12/02/16  6:52 PM  Result Value Ref Range   Alcohol, Ethyl (B) <5 <5 mg/dL    Comment:        LOWEST DETECTABLE LIMIT FOR SERUM ALCOHOL IS 5 mg/dL FOR MEDICAL PURPOSES ONLY   Salicylate level     Status: None   Collection Time: 12/02/16  6:52 PM  Result Value Ref Range   Salicylate Lvl <5.3 2.8 - 30.0 mg/dL  Acetaminophen level     Status: Abnormal   Collection Time: 12/02/16  6:52 PM  Result Value Ref Range   Acetaminophen  (Tylenol), Serum <10 (L) 10 - 30 ug/mL    Comment:        THERAPEUTIC CONCENTRATIONS VARY SIGNIFICANTLY. A RANGE OF 10-30 ug/mL MAY BE AN EFFECTIVE CONCENTRATION FOR MANY PATIENTS. HOWEVER, SOME ARE BEST TREATED AT CONCENTRATIONS OUTSIDE THIS RANGE. ACETAMINOPHEN CONCENTRATIONS >150 ug/mL AT 4 HOURS AFTER INGESTION AND >50 ug/mL AT 12 HOURS AFTER INGESTION ARE OFTEN ASSOCIATED WITH TOXIC REACTIONS.   cbc     Status: None   Collection Time: 12/02/16  6:52 PM  Result Value Ref Range   WBC 9.6 4.0 - 10.5 K/uL   RBC 5.16 4.22 - 5.81 MIL/uL   Hemoglobin 15.1 13.0 - 17.0 g/dL   HCT 44.8 39.0 - 52.0 %   MCV 86.8 78.0 - 100.0 fL   MCH 29.3 26.0 - 34.0 pg   MCHC 33.7 30.0 - 36.0 g/dL   RDW 15.1 11.5 - 15.5 %   Platelets 270 150 - 400 K/uL    Current Facility-Administered Medications  Medication Dose Route Frequency Provider Last Rate Last Dose  . acetaminophen (TYLENOL) tablet 650 mg  650 mg Oral Q6H PRN Lurena Nida, NP   650 mg at 12/03/16 2134  . alum & mag hydroxide-simeth (MAALOX/MYLANTA) 200-200-20 MG/5ML suspension 30 mL  30 mL Oral PRN Lacretia Leigh, MD      . benztropine (COGENTIN) tablet 1 mg  1 mg Oral BID Lurena Nida, NP   1 mg at 12/04/16 0956  . nicotine (NICODERM CQ - dosed in mg/24 hours) patch 21 mg  21 mg Transdermal Daily Lacretia Leigh, MD   Stopped at 12/03/16 1501  . OLANZapine (ZYPREXA) tablet 10 mg  10 mg Oral Daily Patrecia Pour, NP      . OLANZapine (ZYPREXA) tablet 20 mg  20 mg Oral QHS Lurena Nida, NP   20 mg at 12/03/16 2134  . ondansetron (ZOFRAN) tablet 4 mg  4 mg Oral Q8H PRN Lacretia Leigh, MD       Current  Outpatient Prescriptions  Medication Sig Dispense Refill  . benztropine (COGENTIN) 1 MG tablet Take 1 mg by mouth 2 (two) times daily.    . hydrOXYzine (VISTARIL) 25 MG capsule Take 25 mg by mouth 3 (three) times daily as needed for anxiety.    Marland Kitchen OLANZapine (ZYPREXA) 20 MG tablet Take 1 tablet (20 mg total) by mouth at bedtime. 30 tablet 0    Facility-Administered Medications Ordered in Other Encounters  Medication Dose Route Frequency Provider Last Rate Last Dose  . hydrOXYzine (ATARAX/VISTARIL) tablet 50 mg  50 mg Oral TID PRN Encarnacion Slates, NP      . magnesium hydroxide (MILK OF MAGNESIA) suspension 30 mL  30 mL Oral Daily PRN Encarnacion Slates, NP        Musculoskeletal: Strength & Muscle Tone: within normal limits Gait & Station: normal Patient leans: N/A  Psychiatric Specialty Exam: Physical Exam  Constitutional: He is oriented to person, place, and time. He appears well-developed and well-nourished.  HENT:  Head: Normocephalic.  Neck: Normal range of motion.  Respiratory: Effort normal.  Musculoskeletal: Normal range of motion.  Neurological: He is alert and oriented to person, place, and time.  Psychiatric: His speech is normal and behavior is normal. Judgment and thought content normal. Cognition and memory are normal. He exhibits a depressed mood.    Review of Systems  Psychiatric/Behavioral: Positive for substance abuse.  All other systems reviewed and are negative.   Blood pressure 134/80, pulse 91, temperature (P) 97.7 F (36.5 C), temperature source (P) Oral, resp. rate (P) 18, height _0  (1.753 m), weight 93 kg (205 lb), SpO2 (P) 98 %.Body mass index is 30.27 kg/m.  General Appearance: Casual  Eye Contact:  Fair  Speech:  Normal Rate  Volume:  Normal  Mood:  Depressed  Affect:  Congruent  Thought Process:  Coherent and Descriptions of Associations: Intact  Orientation:  Full (Time, Place, and Person)  Thought Content:  WDL and Logical  Suicidal Thoughts:  No  Homicidal Thoughts:  No  Memory:  Immediate;   Good Recent;   Good Remote;   Good  Judgement:  Fair  Insight:  Fair  Psychomotor Activity:  Normal  Concentration:  Concentration: Fair and Attention Span: Fair  Recall:  Good  Fund of Knowledge:  Fair  Language:  Good  Akathisia:  No  Handed:  Right  AIMS (if indicated):     Assets:   Leisure Time Physical Health Resilience Social Support  ADL's:  Intact  Cognition:  WNL  Sleep:        Treatment Plan Summary: Daily contact with patient to assess and evaluate symptoms and progress in treatment and Medication management: -Crisis stabilization -Medication management:  Continue Zyprexa 20 mg at bedtime for mood stabilization, started Zyprexa mg in the am, Vistaril 25 mg TID for anxiety, and Cogentin 1 mg BID for EPS -Individual counseling   Disposition: Recommend psychiatric Inpatient admission when medically cleared.  Waylan Boga, NP 12/04/2016 5:08 PM  Patient seen face-to-face for psychiatric evaluation, chart reviewed and case discussed with the physician extender and developed treatment plan. Reviewed the information documented and agree with the treatment plan. Corena Pilgrim, MD

## 2016-12-04 NOTE — BH Assessment (Signed)
Per Dr. Darleene Cleaver and Waylan Boga, DNP, patient continues to meet inpatient treatment (500). TTS to seek placement. Writer referred patient to [facility] and [facility].

## 2016-12-04 NOTE — ED Notes (Signed)
Pt A&O x 3, no distress noted, calm & cooperative, watching TV at present.  Monitoring for safety, Q 15 min checks in effect.

## 2016-12-04 NOTE — Progress Notes (Signed)
12/04/16 1357:  LRT went to pt room to offer activities, pt was sleep.  Victorino Sparrow, LRT/CTRS

## 2016-12-04 NOTE — ED Notes (Signed)
Introduced self to patient. Pt oriented to unit expectations.  Assessed pt for:  A) Anxiety &/or agitation: Pt has been calm and cooperative today. He does not appear to be experiencing withdrawal symptoms and does not appear to be responding to internal stimuli. He denies SI/HI, AVH.   S) Safety: Safety maintained with q-15-minute checks and hourly rounds by staff.  A) ADLs: Pt able to perform ADLs independently.  P) Pick-Up (room cleanliness): Pt's room clean and free of clutter.

## 2016-12-05 DIAGNOSIS — F1290 Cannabis use, unspecified, uncomplicated: Secondary | ICD-10-CM | POA: Diagnosis not present

## 2016-12-05 DIAGNOSIS — F1490 Cocaine use, unspecified, uncomplicated: Secondary | ICD-10-CM | POA: Diagnosis not present

## 2016-12-05 DIAGNOSIS — Z79899 Other long term (current) drug therapy: Secondary | ICD-10-CM | POA: Diagnosis not present

## 2016-12-05 DIAGNOSIS — F200 Paranoid schizophrenia: Secondary | ICD-10-CM | POA: Diagnosis not present

## 2016-12-05 DIAGNOSIS — F17210 Nicotine dependence, cigarettes, uncomplicated: Secondary | ICD-10-CM | POA: Diagnosis not present

## 2016-12-05 MED ORDER — DIPHENHYDRAMINE HCL 50 MG/ML IJ SOLN
25.0000 mg | Freq: Once | INTRAMUSCULAR | Status: AC
  Administered 2016-12-05: 25 mg via INTRAMUSCULAR
  Filled 2016-12-05: qty 1

## 2016-12-05 MED ORDER — HALOPERIDOL DECANOATE 100 MG/ML IM SOLN
50.0000 mg | Freq: Once | INTRAMUSCULAR | Status: AC
  Administered 2016-12-05: 50 mg via INTRAMUSCULAR
  Filled 2016-12-05: qty 0.5

## 2016-12-05 NOTE — BHH Suicide Risk Assessment (Signed)
Suicide Risk Assessment  Discharge Assessment   [facility] Discharge Suicide Risk Assessment   Principal Problem: Paranoid schizophrenia [facility]) Discharge Diagnoses:  Patient Active Problem List   Diagnosis Date Noted  . Cocaine dependence with cocaine-induced mood disorder ([place]) [F14.24] 03/19/2016    Priority: High  . Cocaine-induced mood disorder ([place]) [F14.94] 03/03/2016    Priority: High  . Cocaine abuse [F14.10] 03/03/2016    Priority: High  . Paranoid schizophrenia ([place]) [F20.0]     Priority: High  . Non compliance w medication regimen [Z91.14]   . Cocaine use disorder, moderate, dependence ([place]) [F14.20] 06/09/2016  . Cannabis use disorder, severe, dependence ([place]) [F12.20] 06/09/2016  . Suicidal ideation [R45.851]   . Reflux esophagitis [K21.0] 05/24/2013  . Other specified gastritis without mention of hemorrhage [K29.60] 05/24/2013  . Nausea with vomiting [R11.2] 05/23/2013  . Anemia, unspecified [D64.9] 05/23/2013  . Nonspecific abnormal finding in stool contents [R19.5] 05/23/2013  . Ulcerative colitis ([place]) [K51.90]   . SUBSTANCE ABUSE, MULTIPLE [F19.10] 12/31/2007    Total Time spent with patient: 30 minutes  Musculoskeletal: Strength & Muscle Tone: within normal limits Gait & Station: normal Patient leans: N/A  Psychiatric Specialty Exam: Physical Exam  Constitutional: He is oriented to person, place, and time. He appears well-developed and well-nourished.  HENT:  Head: Normocephalic.  Neck: Normal range of motion.  Respiratory: Effort normal.  Musculoskeletal: Normal range of motion.  Neurological: He is alert and oriented to person, place, and time.  Psychiatric: He has a normal mood and affect. His speech is normal and behavior is normal. Judgment and thought content normal. Cognition and memory are normal. He does not exhibit a depressed mood.    Review of Systems  Psychiatric/Behavioral: Positive for substance abuse.  All other systems reviewed and are  negative.   Blood pressure 127/76, pulse 83, temperature 97.9 F (36.6 C), temperature source Oral, resp. rate 18, height 5' 9"  (1.753 m), weight 93 kg (205 lb), SpO2 94 %.Body mass index is 30.27 kg/m.  General Appearance: Casual  Eye Contact:  Fair  Speech:  Normal Rate  Volume:  Normal  Mood:  Euthymic  Affect:  Congruent  Thought Process:  Coherent and Descriptions of Associations: Intact  Orientation:  Full (Time, Place, and Person)  Thought Content:  WDL and Logical  Suicidal Thoughts:  No  Homicidal Thoughts:  No  Memory:  Immediate;   Good Recent;   Good Remote;   Good  Judgement:  Fair  Insight:  Fair  Psychomotor Activity:  Normal  Concentration:  Concentration: Fair and Attention Span: Fair  Recall:  Good  Fund of Knowledge:  Fair  Language:  Good  Akathisia:  No  Handed:  Right  AIMS (if indicated):     Assets:  Leisure Time Physical Health Resilience Social Support  ADL's:  Intact  Cognition:  WNL  Sleep:      Mental Status Per Nursing Assessment::   On Admission:   suicidal ideations  Demographic Factors:  Male  Loss Factors: NA  Historical Factors: NA  Risk Reduction Factors:   Sense of responsibility to family, Living with another person, especially a relative, Positive social support and Positive therapeutic relationship  Continued Clinical Symptoms:  None  Cognitive Features That Contribute To Risk:  None    Suicide Risk:  Minimal: No identifiable suicidal ideation.  Patients presenting with no risk factors but with morbid ruminations; may be classified as minimal risk based on the severity of the depressive symptoms  Plan Of Care/Follow-up recommendations:  Activity:  as tolerated Diet:  heart healthy diet  Keith Swayze, NP 12/05/2016, 1:50 PM

## 2016-12-05 NOTE — Discharge Instructions (Addendum)
For you ongoing behavioral health needs, you are advised to to continue treatment with the [facility]:       [facility]      [address]      [place], [place] [address]      [phone]

## 2016-12-05 NOTE — BH Assessment (Addendum)
[facility] Assessment Progress Note  Per Corena Pilgrim, MD, this pt does not require psychiatric hospitalization at this time.  Pt is to be discharged from [facility] with recommendation to continue treatment with the [facility], his current outpatient provider.  This has been included in pt's discharge instructions.  Pt's nurse, Dawnaly, has been notified.  Jalene Mullet, MA Triage Specialist [phone]   Addendum:  Pt presents under IVC initiated by his mother, which Dr Darleene Cleaver has rescinded.  Please note that while EPIC banner indicates that pt has a legal guardian, no letter of guardianship can be found in pt's chart or in his EPIC record.  Pt's assessment indicates that pt's mother, with whom he lives, is his power of attorney.  No copy of this can be found in pt's chart, and it is unknown if this is a health care power of attorney  Jalene Mullet, [place] Triage Specialist [phone]

## 2016-12-05 NOTE — Consult Note (Signed)
[place] Psychiatry Consult   Reason for Consult:  Suicidal/homicidal ideations Referring Physician:  EDP Patient Identification: RAYNELL SCOTT MRN:  [id number] Principal Diagnosis: Paranoid schizophrenia [facility]) Diagnosis:   Patient Active Problem List   Diagnosis Date Noted  . Cocaine dependence with cocaine-induced mood disorder ([place]) [F14.24] 03/19/2016    Priority: High  . Cocaine-induced mood disorder ([place]) [F14.94] 03/03/2016    Priority: High  . Cocaine abuse [F14.10] 03/03/2016    Priority: High  . Paranoid schizophrenia ([place]) [F20.0]     Priority: High  . Non compliance w medication regimen [Z91.14]   . Cocaine use disorder, moderate, dependence ([place]) [F14.20] 06/09/2016  . Cannabis use disorder, severe, dependence ([place]) [F12.20] 06/09/2016  . Suicidal ideation [R45.851]   . Reflux esophagitis [K21.0] 05/24/2013  . Other specified gastritis without mention of hemorrhage [K29.60] 05/24/2013  . Nausea with vomiting [R11.2] 05/23/2013  . Anemia, unspecified [D64.9] 05/23/2013  . Nonspecific abnormal finding in stool contents [R19.5] 05/23/2013  . Ulcerative colitis ([place]) [K51.90]   . SUBSTANCE ABUSE, MULTIPLE [F19.10] 12/31/2007    Total Time spent with patient: 30 minutes  Subjective:   RUTLEDGE SELSOR is a 36 y.o. male patient admitted with psychosis.  HPI:  36 yo male who presented to the ED with noncompliance of medications and suicidal/homicidal ideations.  Injectable antipsychotic started with no adverse side effects.  Denies suicidal/homicidal ideations, hallucinations, or withdrawal symptoms.  Stable for discharge, ACT team notified.  Past Psychiatric History: schizophrenia, drug abuse  Risk to Self: None Risk to Others: Homicidal Ideation: No Thoughts of Harm to Others: No Current Homicidal Intent: No Current Homicidal Plan: No Access to Homicidal Means: No Identified Victim: no one History of harm to others?: No Assessment of Violence: None  Noted Violent Behavior Description: Pt denies Does patient have access to weapons?: No Criminal Charges Pending?: Yes Describe Pending Criminal Charges: Possession of cocaine Does patient have a court date: Yes Court Date: 12/04/16 Prior Inpatient Therapy: Prior Inpatient Therapy: Yes Prior Therapy Dates: August 2017 Prior Therapy Facilty/Provider(s): [facility] Reason for Treatment: psychosis Prior Outpatient Therapy: Prior Outpatient Therapy: Yes Prior Therapy Dates: Current to last 2 months Prior Therapy Facilty/Provider(s): ACTT team Reason for Treatment: med management Does patient have an ACCT team?: Yes (Pt does not know who his ACTT team is.) Does patient have Intensive In-House Services?  : No Does patient have [facility] services? : No Does patient have P4CC services?: No  Past Medical History:  Past Medical History:  Diagnosis Date  . Crohn's disease ([place])   . GI (gastrointestinal bleed) 04/2013  . Multiple myeloma ([place])   . Schizophrenia ([place])   . Sickle cell anemia (HCC)   . Suicide attempt by drug ingestion ([place])   . Ulcerative colitis     Past Surgical History:  Procedure Laterality Date  . ESOPHAGOGASTRODUODENOSCOPY N/A 05/24/2013   Procedure: ESOPHAGOGASTRODUODENOSCOPY (EGD);  Surgeon: Ladene Artist, MD;  Location: [facility] ENDOSCOPY;  Service: Endoscopy;  Laterality: N/A;  . FLEXIBLE SIGMOIDOSCOPY  12/05/2011   Procedure: FLEXIBLE SIGMOIDOSCOPY;  Surgeon: Beryle Beams, MD;  Location: [facility] ENDOSCOPY;  Service: Endoscopy;  Laterality: N/A;  . MANDIBLE FRACTURE SURGERY     Family History:  Family History  Problem Relation Age of Onset  . Liver cancer     Family Psychiatric  History: unknown Social History:  History  Alcohol Use  . Yes    Comment: quit     History  Drug Use  . Types: Cocaine, Marijuana  Comment: quit 1 mo ago    Social History   Social History  . Marital status: Single    Spouse name: N/A  . Number of children: N/A  . Years of education:  GED   Occupational History  . disabled    Social History Main Topics  . Smoking status: Current Every Day Smoker    Packs/day: 0.25    Years: 20.00    Types: Cigarettes  . Smokeless tobacco: Never Used  . Alcohol use Yes     Comment: quit  . Drug use: Yes    Types: Cocaine, Marijuana     Comment: quit 1 mo ago  . Sexual activity: No   Other Topics Concern  . None   Social History Narrative   Mr. Gandolfi was recently released from prison, currently living at home with his mother.  He is disabled (due to mental illness?).    Additional Social History:    Allergies:   Allergies  Allergen Reactions  . Depakote [Divalproex Sodium] Other (See Comments)    Reaction:  Seizures   . Lithium Diarrhea and Other (See Comments)    Reaction:  Rectal bleeding   . Nsaids Other (See Comments)    Reaction:  Rectal bleeding   . Risperdal [Risperidone] Shortness Of Breath and Other (See Comments)    Reaction:  Chest pain   . Nsaids Itching    Labs:  No results found for this or any previous visit (from the past 48 hour(s)).  Current Facility-Administered Medications  Medication Dose Route Frequency Provider Last Rate Last Dose  . acetaminophen (TYLENOL) tablet 650 mg  650 mg Oral Q6H PRN Lurena Nida, NP   650 mg at 12/04/16 1743  . alum & mag hydroxide-simeth (MAALOX/MYLANTA) 200-200-20 MG/5ML suspension 30 mL  30 mL Oral PRN Lacretia Leigh, MD      . benztropine (COGENTIN) tablet 1 mg  1 mg Oral BID Lurena Nida, NP   1 mg at 12/04/16 2150  . nicotine (NICODERM CQ - dosed in mg/24 hours) patch 21 mg  21 mg Transdermal Daily Lacretia Leigh, MD   Stopped at 12/03/16 1501  . OLANZapine (ZYPREXA) tablet 10 mg  10 mg Oral Daily Patrecia Pour, NP   10 mg at 12/04/16 1743  . OLANZapine (ZYPREXA) tablet 20 mg  20 mg Oral QHS Lurena Nida, NP   20 mg at 12/04/16 2150  . ondansetron (ZOFRAN) tablet 4 mg  4 mg Oral Q8H PRN Lacretia Leigh, MD       Current Outpatient Prescriptions   Medication Sig Dispense Refill  . benztropine (COGENTIN) 1 MG tablet Take 1 mg by mouth 2 (two) times daily.    . hydrOXYzine (VISTARIL) 25 MG capsule Take 25 mg by mouth 3 (three) times daily as needed for anxiety.    Marland Kitchen OLANZapine (ZYPREXA) 20 MG tablet Take 1 tablet (20 mg total) by mouth at bedtime. 30 tablet 0   Facility-Administered Medications Ordered in Other Encounters  Medication Dose Route Frequency Provider Last Rate Last Dose  . hydrOXYzine (ATARAX/VISTARIL) tablet 50 mg  50 mg Oral TID PRN Encarnacion Slates, NP      . magnesium hydroxide (MILK OF MAGNESIA) suspension 30 mL  30 mL Oral Daily PRN Encarnacion Slates, NP        Musculoskeletal: Strength & Muscle Tone: within normal limits Gait & Station: normal Patient leans: N/A  Psychiatric Specialty Exam: Physical Exam  Constitutional: He is oriented to person,  place, and time. He appears well-developed and well-nourished.  HENT:  Head: Normocephalic.  Neck: Normal range of motion.  Respiratory: Effort normal.  Musculoskeletal: Normal range of motion.  Neurological: He is alert and oriented to person, place, and time.  Psychiatric: He has a normal mood and affect. His speech is normal and behavior is normal. Judgment and thought content normal. Cognition and memory are normal. He does not exhibit a depressed mood.    Review of Systems  Psychiatric/Behavioral: Positive for substance abuse.  All other systems reviewed and are negative.   Blood pressure 127/76, pulse 83, temperature 97.9 F (36.6 C), temperature source Oral, resp. rate 18, height 5' 9"  (1.753 m), weight 93 kg (205 lb), SpO2 94 %.Body mass index is 30.27 kg/m.  General Appearance: Casual  Eye Contact:  Fair  Speech:  Normal Rate  Volume:  Normal  Mood:  Euthymic  Affect:  Congruent  Thought Process:  Coherent and Descriptions of Associations: Intact  Orientation:  Full (Time, Place, and Person)  Thought Content:  WDL and Logical  Suicidal Thoughts:  No   Homicidal Thoughts:  No  Memory:  Immediate;   Good Recent;   Good Remote;   Good  Judgement:  Fair  Insight:  Fair  Psychomotor Activity:  Normal  Concentration:  Concentration: Fair and Attention Span: Fair  Recall:  Good  Fund of Knowledge:  Fair  Language:  Good  Akathisia:  No  Handed:  Right  AIMS (if indicated):     Assets:  Leisure Time Physical Health Resilience Social Support  ADL's:  Intact  Cognition:  WNL  Sleep:        Treatment Plan Summary: Daily contact with patient to assess and evaluate symptoms and progress in treatment and Medication management: -Crisis stabilization -Medication management:  Continue Zyprexa 20 mg at bedtime for mood stabilization, started Zyprexa mg in the am, Vistaril 25 mg TID for anxiety, and Cogentin 1 mg BID for EPS -Individual counseling -Coordination of care with his ACT team   Disposition: discharge home   Waylan Boga, NP 12/05/2016 12:34 PM  Patient seen face-to-face for psychiatric evaluation, chart reviewed and case discussed with the physician extender and developed treatment plan. Reviewed the information documented and agree with the treatment plan. Corena Pilgrim, MD

## 2016-12-05 NOTE — ED Notes (Signed)
Patient discharged to home.  He denies thoughts of harm to self or others.  All belongings were returned and signed for.  Patient left the unit ambulatory and was escorted to the front lobby.  Per request of his ACT team I faxed a copy of his AVS to Court at [facility].  Patient signed a release so that I could do that.

## 2016-12-09 ENCOUNTER — Emergency Department (HOSPITAL_COMMUNITY)
Admission: EM | Admit: 2016-12-09 | Discharge: 2016-12-09 | Disposition: A | Discharge status: Home / Self Care | Payer: Medicare Other | Attending: Emergency Medicine | Admitting: Emergency Medicine

## 2016-12-09 ENCOUNTER — Encounter (HOSPITAL_COMMUNITY): Payer: Self-pay | Admitting: Emergency Medicine

## 2016-12-09 DIAGNOSIS — Z203 Contact with and (suspected) exposure to rabies: Secondary | ICD-10-CM | POA: Insufficient documentation

## 2016-12-09 DIAGNOSIS — Z79899 Other long term (current) drug therapy: Secondary | ICD-10-CM | POA: Insufficient documentation

## 2016-12-09 DIAGNOSIS — Y929 Unspecified place or not applicable: Secondary | ICD-10-CM | POA: Insufficient documentation

## 2016-12-09 DIAGNOSIS — Z2914 Encounter for prophylactic rabies immune globin: Secondary | ICD-10-CM | POA: Insufficient documentation

## 2016-12-09 DIAGNOSIS — Z23 Encounter for immunization: Secondary | ICD-10-CM | POA: Diagnosis not present

## 2016-12-09 DIAGNOSIS — Y999 Unspecified external cause status: Secondary | ICD-10-CM | POA: Insufficient documentation

## 2016-12-09 DIAGNOSIS — S31154A Open bite of abdominal wall, left lower quadrant without penetration into peritoneal cavity, initial encounter: Secondary | ICD-10-CM | POA: Insufficient documentation

## 2016-12-09 DIAGNOSIS — S31159A Open bite of abdominal wall, unspecified quadrant without penetration into peritoneal cavity, initial encounter: Secondary | ICD-10-CM | POA: Diagnosis not present

## 2016-12-09 DIAGNOSIS — Y939 Activity, unspecified: Secondary | ICD-10-CM | POA: Insufficient documentation

## 2016-12-09 DIAGNOSIS — S31114A Laceration without foreign body of abdominal wall, left lower quadrant without penetration into peritoneal cavity, initial encounter: Secondary | ICD-10-CM | POA: Diagnosis not present

## 2016-12-09 DIAGNOSIS — W540XXA Bitten by dog, initial encounter: Secondary | ICD-10-CM | POA: Diagnosis not present

## 2016-12-09 DIAGNOSIS — F17210 Nicotine dependence, cigarettes, uncomplicated: Secondary | ICD-10-CM | POA: Diagnosis not present

## 2016-12-09 MED ORDER — RABIES IMMUNE GLOBULIN 150 UNIT/ML IM INJ
20.0000 [IU]/kg | INJECTION | Freq: Once | INTRAMUSCULAR | Status: AC
  Administered 2016-12-09: 1875 [IU] via INTRAMUSCULAR
  Filled 2016-12-09: qty 12.5

## 2016-12-09 MED ORDER — OXYCODONE-ACETAMINOPHEN 5-325 MG PO TABS
1.0000 | ORAL_TABLET | Freq: Once | ORAL | Status: AC
  Administered 2016-12-09: 1 via ORAL
  Filled 2016-12-09: qty 1

## 2016-12-09 MED ORDER — AMOXICILLIN-POT CLAVULANATE 875-125 MG PO TABS: 1.0000 | ORAL_TABLET | Freq: Two times a day (BID) | ORAL | 0 refills | Status: DC

## 2016-12-09 MED ORDER — RABIES VACCINE, PCEC IM SUSR
1.0000 mL | Freq: Once | INTRAMUSCULAR | Status: AC
  Administered 2016-12-09: 1 mL via INTRAMUSCULAR
  Filled 2016-12-09: qty 1

## 2016-12-09 MED ORDER — OXYCODONE-ACETAMINOPHEN 5-325 MG PO TABS: 1.0000 | ORAL_TABLET | Freq: Three times a day (TID) | ORAL | 0 refills | Status: DC | PRN

## 2016-12-09 MED ORDER — AMOXICILLIN-POT CLAVULANATE 875-125 MG PO TABS
1.0000 | ORAL_TABLET | Freq: Once | ORAL | Status: AC
  Administered 2016-12-09: 1 via ORAL
  Filled 2016-12-09: qty 1

## 2016-12-09 MED ORDER — TETANUS-DIPHTH-ACELL PERTUSSIS 5-2.5-18.5 LF-MCG/0.5 IM SUSP
0.5000 mL | Freq: Once | INTRAMUSCULAR | Status: AC
  Administered 2016-12-09: 0.5 mL via INTRAMUSCULAR
  Filled 2016-12-09: qty 0.5

## 2016-12-09 MED ORDER — LIDOCAINE HCL (PF) 1 % IJ SOLN
5.0000 mL | Freq: Once | INTRAMUSCULAR | Status: AC
  Administered 2016-12-09: 30 mL
  Filled 2016-12-09: qty 30

## 2016-12-09 NOTE — ED Triage Notes (Signed)
Per EMS- pt stated that a stray dog  jumped up and bit him on the r/abdomen. Animal Control at bedside,  report completed . Pt alert oriented and cooperative. Skin tear with possible small bite marks noted by EMS

## 2016-12-09 NOTE — ED Provider Notes (Signed)
[place] DEPT Provider Note    By signing my name below, I, Bea Graff, attest that this documentation has been prepared under the direction and in the presence of Shary Decamp, PA-C. Electronically Signed: Bea Graff, ED Scribe. 12/09/16. 7:04 PM.    History   Chief Complaint Chief Complaint  Patient presents with  . Animal Bite   The history is provided by the patient and the EMS personnel. No language interpreter was used.    [name] is a 36 y.o. male with PMHx of Crohn's disease, schizophrenia, sickle cell anemia, polysubstance abuse and multiple myeloma brought in by EMS who presents to the Emergency Department complaining of a dog bite to the left side of his abdomen that occurred PTA. He reports associated bleeding that is now controlled and burning pain. He has not taken anything for pain relief. He denies modifying factors. He denies fever, chills, nausea, vomiting. He states his tetanus vaccination is not UTD. He does not have a PCP.   Past Medical History:  Diagnosis Date  . Crohn's disease ([place])   . GI (gastrointestinal bleed) 04/2013  . Multiple myeloma ([place])   . Schizophrenia ([place])   . Sickle cell anemia (HCC)   . Suicide attempt by drug ingestion ([place])   . Ulcerative colitis     Patient Active Problem List   Diagnosis Date Noted  . Non compliance w medication regimen   . Cocaine use disorder, moderate, dependence ([place]) 06/09/2016  . Cannabis use disorder, severe, dependence ([place]) 06/09/2016  . Cocaine dependence with cocaine-induced mood disorder ([place]) 03/19/2016  . Cocaine-induced mood disorder ([place]) 03/03/2016  . Cocaine abuse 03/03/2016  . Suicidal ideation   . Reflux esophagitis 05/24/2013  . Other specified gastritis without mention of hemorrhage 05/24/2013  . Nausea with vomiting 05/23/2013  . Anemia, unspecified 05/23/2013  . Nonspecific abnormal finding in stool contents 05/23/2013  . Paranoid schizophrenia ([place])   .  Ulcerative colitis ([place])   . SUBSTANCE ABUSE, MULTIPLE 12/31/2007    Past Surgical History:  Procedure Laterality Date  . ESOPHAGOGASTRODUODENOSCOPY N/A 05/24/2013   Procedure: ESOPHAGOGASTRODUODENOSCOPY (EGD);  Surgeon: Ladene Artist, MD;  [facility] END[facility]COPY;  Service: Endoscopy;  Laterality: N/A;  . FLEXIBLE SIGMOIDOSCOPY  12/05/2011   Procedure: FLEXIBLE SIGMOIDOSCOPY;  Surgeon: Beryle Beams, MD;  Location: [facility] ENDOSCOPY;  Service: Endoscopy;  Laterality: N/A;  . MANDIBLE FRACTURE SURGERY         Home Medications    Prior to Admission medications   Medication Sig Start Date End Date Taking? Authorizing Provider  benztropine (COGENTIN) 1 MG tablet Take 1 mg by mouth 2 (two) times daily.    Historical Provider, MD  hydrOXYzine (VISTARIL) 25 MG capsule Take 25 mg by mouth 3 (three) times daily as needed for anxiety.    Historical Provider, MD  OLANZapine (ZYPREXA) 20 MG tablet Take 1 tablet (20 mg total) by mouth at bedtime. 06/12/16   Kerrie Buffalo, NP    Family History Family History  Problem Relation Age of Onset  . Liver cancer      Social History Social History  Substance Use Topics  . Smoking status: Current Every Day Smoker    Packs/day: 0.25    Years: 20.00    Types: Cigarettes  . Smokeless tobacco: Never Used  . Alcohol use Yes     Comment: quit     Allergies   Depakote [divalproex sodium]; Lithium; Nsaids; Risperdal [risperidone]; and Nsaids   Review of Systems Review of Systems  Constitutional: Negative for chills and fever.  Gastrointestinal: Negative for nausea and vomiting.  Skin: Positive for wound.     Physical Exam Updated Vital Signs BP 131/80 (BP Location: Right Arm)   Pulse 116   Temp 98.3 F (36.8 C) (Oral)   Resp 20   Wt 93 kg   SpO2 95%   BMI 30.27 kg/m   Physical Exam  Constitutional: He is oriented to person, place, and time. Vital signs are normal. He appears well-developed and well-nourished.  HENT:  Head:  Normocephalic and atraumatic.  Right Ear: Hearing normal.  Left Ear: Hearing normal.  Eyes: Conjunctivae and EOM are normal. Pupils are equal, round, and reactive to light.  Neck: Normal range of motion.  Cardiovascular: Normal rate, regular rhythm, normal heart sounds and intact distal pulses.   Pulmonary/Chest: Effort normal and breath sounds normal.  Abdominal: Soft. Bowel sounds are normal. There is no tenderness. There is no rigidity, no rebound, no guarding, no CVA tenderness, no tenderness at McBurney's point and negative Murphy's sign.  See Image below. Area non erythematous. No purulence. Bottom of wound visualized. Bleeding controlled. Approx 2cm diameter.   Musculoskeletal: Normal range of motion.  Neurological: He is alert and oriented to person, place, and time.  Skin: Skin is warm and dry.  Psychiatric: He has a normal mood and affect. His speech is normal and behavior is normal. Thought content normal.  Nursing note and vitals reviewed.      ED Treatments / Results   COORDINATION OF CARE: 6:55 PM- Will prescribe antibiotic and loosely close wound. Will order rabies vaccination and tetanus vaccination. Pt verbalizes understanding and agrees to plan.  Medications - No data to display  Labs (all labs ordered are listed, but only abnormal results are displayed) Labs Reviewed - No data to display  EKG  EKG Interpretation None       Radiology No results found.  Procedures .Marland KitchenLaceration Repair Date/Time: 12/09/2016 7:27 PM Performed by: Shary Decamp Authorized by: Shary Decamp   Consent:    Consent obtained:  Verbal   Consent given by:  Patient   Risks discussed:  Pain and infection   Alternatives discussed:  No treatment Anesthesia (see MAR for exact dosages):    Anesthesia method:  Local infiltration   Local anesthetic:  Lidocaine 1% w/o epi Laceration details:    Location:  Trunk   Trunk location:  LLQ abd   Length (cm):  2 Pre-procedure details:     Preparation:  Imaging obtained to evaluate for foreign bodies Exploration:    Hemostasis achieved with:  Direct pressure   Wound exploration: entire depth of wound probed and visualized     Contaminated: no   Treatment:    Area cleansed with:  Hibiclens and Betadine   Amount of cleaning:  Extensive   Irrigation solution:  Sterile saline   Irrigation method:  Pressure wash Skin repair:    Repair method:  Sutures   Suture size:  3-0   Suture material:  Prolene   Suture technique:  Simple interrupted   Number of sutures:  1 Approximation:    Approximation:  Loose   Vermilion border: poorly aligned   Post-procedure details:    Dressing:  Antibiotic ointment and sterile dressing   Patient tolerance of procedure:  Tolerated well, no immediate complications Comments:     LOOSELY closed wound to allow drainage from animal bite   (including critical care time)  Medications Ordered in ED Medications  Tdap (BOOSTRIX) injection  0.5 mL (not administered)  lidocaine (PF) (XYLOCAINE) 1 % injection 5 mL (not administered)  rabies immune globulin (HYPERAB) injection 1,875 Units (not administered)  rabies vaccine (RABAVERT) injection 1 mL (not administered)  oxyCODONE-acetaminophen (PERCOCET/ROXICET) 5-325 MG per tablet 1 tablet (not administered)   Initial Impression / Assessment and Plan / ED Course  I have reviewed the triage vital signs and the nursing notes.  Pertinent labs & imaging results that were available during my care of the patient were reviewed by me and considered in my medical decision making (see chart for details).    Final Clinical Impressions(s) / ED Diagnoses  I have reviewed the relevant previous healthcare records. I have reviewed EMS Documentation. I obtained HPI from historian.  ED Course:  Assessment: Patient here for dog bite from a stray dog to the abdomen that occurred within the past hour. The area shows no signs of infection and bleeding is controlled.  Afebrile and hemodynamically stable. Pt is given instructions for with home care and prescribed antibiotics (Augmentin). Given Rabies vaccine and immunoglobulin in ED. Return for series. Wound loosely closed and pt advised to return in 8-10 for suture removal or sooner for signs of infection. Pt has a good understanding of return precautions and is safe for discharge at this time.  Disposition/Plan:  D/c Additional Verbal discharge instructions given and discussed with patient.  Pt Instructed to f/u with [facility] in the next week for evaluation and treatment of symptoms. Return precautions given Pt acknowledges and agrees with plan  Supervising Physician Jola Schmidt, MD  Final diagnoses:  Dog bite, initial encounter   I personally performed the services described in this documentation, which was scribed in my presence. The recorded information has been reviewed and is accurate.  New Prescriptions New Prescriptions   No medications on file     Shary Decamp, PA-C 12/09/16 [place], MD 12/10/16 1415

## 2016-12-09 NOTE — ED Notes (Signed)
Bed: WTR7 Expected date:  Expected time:  Means of arrival: Ambulance Comments: EMS dog bite

## 2016-12-09 NOTE — Discharge Instructions (Signed)
Please read and follow all provided instructions.  Your diagnoses today include:  1. Dog bite, initial encounter    Tests performed today include: Vital signs. See below for your results today.   Medications prescribed:   Take any prescribed medications only as directed.   Home care instructions:  Follow any educational materials and wound care instructions contained in this packet.   You may shower and wash the area with soap and water, just be sure to pat the area dry and not rub over the stitches. Do no put your stiches underwater (in a bath, pool, or lake). Getting stiches wet can slow down healing and increase your chances of getting an infection. You may apply Bacitracin or Neosporin twice a day for 7 days, and keep the ara clean with  bandage or gauze. Do not apply alcohol or hydrogen peroxide. Cover the area if it draining or weeping.   Follow-up instructions: Suture Removal: Return to the Emergency Department or see your primary care care doctor in 7 days for a recheck of your wound, 2nd series of rabies shots, and removal of your sutures or staples.    Return instructions:  Return to the Emergency Department if you have: Fever Worsening pain Worsening swelling of the wound Pus draining from the wound Redness of the skin that moves away from the wound, especially if it streaks away from the affected area  Any other emergent concerns  Your vital signs today were: BP 131/80 (BP Location: Right Arm)    Pulse 116    Temp 98.3 F (36.8 C) (Oral)    Resp 20    Wt 93 kg    SpO2 95%    BMI 30.27 kg/m  If your blood pressure (BP) was elevated above 135/85 this visit, please have this repeated by your doctor within one month. --------------

## 2016-12-09 NOTE — ED Notes (Signed)
Pt has a ride home.  

## 2016-12-17 ENCOUNTER — Encounter (HOSPITAL_COMMUNITY): Payer: Self-pay

## 2016-12-17 ENCOUNTER — Emergency Department (HOSPITAL_COMMUNITY)
Admission: EM | Admit: 2016-12-17 | Discharge: 2016-12-17 | Disposition: A | Discharge status: Home / Self Care | Payer: Medicare Other | Attending: Emergency Medicine | Admitting: Emergency Medicine

## 2016-12-17 DIAGNOSIS — Z4802 Encounter for removal of sutures: Secondary | ICD-10-CM | POA: Diagnosis not present

## 2016-12-17 DIAGNOSIS — Z79899 Other long term (current) drug therapy: Secondary | ICD-10-CM | POA: Diagnosis not present

## 2016-12-17 DIAGNOSIS — F17210 Nicotine dependence, cigarettes, uncomplicated: Secondary | ICD-10-CM | POA: Insufficient documentation

## 2016-12-17 NOTE — ED Provider Notes (Signed)
[facility] DEPT Provider Note   CSN: [id number] Arrival date & time: 12/17/16  0810     History   Chief Complaint Chief Complaint  Patient presents with  . Suture / Staple Removal    HPI [name] is a 36 y.o. male.  The history is provided by the patient and medical records. No language interpreter was used.  Suture / Staple Removal    [name] is a 36 y.o. male  who presents to the Emergency Department For suture removal. Patient states he was bit by dog last week and had one suture placed. He has been keeping the area clean. Denies any redness or swelling. No drainage. States everything is healing well. No other complaints.   Past Medical History:  Diagnosis Date  . Crohn's disease ([place])   . GI (gastrointestinal bleed) 04/2013  . Multiple myeloma ([place])   . Schizophrenia ([place])   . Sickle cell anemia (HCC)   . Suicide attempt by drug ingestion ([place])   . Ulcerative colitis     Patient Active Problem List   Diagnosis Date Noted  . Non compliance w medication regimen   . Cocaine use disorder, moderate, dependence ([place]) 06/09/2016  . Cannabis use disorder, severe, dependence ([place]) 06/09/2016  . Cocaine dependence with cocaine-induced mood disorder ([place]) 03/19/2016  . Cocaine-induced mood disorder ([place]) 03/03/2016  . Cocaine abuse 03/03/2016  . Suicidal ideation   . Reflux esophagitis 05/24/2013  . Other specified gastritis without mention of hemorrhage 05/24/2013  . Nausea with vomiting 05/23/2013  . Anemia, unspecified 05/23/2013  . Nonspecific abnormal finding in stool contents 05/23/2013  . Paranoid schizophrenia ([place])   . Ulcerative colitis ([place])   . SUBSTANCE ABUSE, MULTIPLE 12/31/2007    Past Surgical History:  Procedure Laterality Date  . ESOPHAGOGASTRODUODENOSCOPY N/A 05/24/2013   Procedure: ESOPHAGOGASTRODUODENOSCOPY (EGD);  Surgeon: Ladene Artist, MD;  Location: [facility] ENDOSCOPY;  Service: Endoscopy;  Laterality: N/A;  . FLEXIBLE  SIGMOIDOSCOPY  12/05/2011   Procedure: FLEXIBLE SIGMOIDOSCOPY;  Surgeon: Beryle Beams, MD;  Location: [facility] ENDOSCOPY;  Service: Endoscopy;  Laterality: N/A;  . MANDIBLE FRACTURE SURGERY         Home Medications    Prior to Admission medications   Medication Sig Start Date End Date Taking? Authorizing Provider  amoxicillin-clavulanate (AUGMENTIN) 875-125 MG tablet Take 1 tablet by mouth every 12 (twelve) hours. 12/09/16   Shary Decamp, PA-C  benztropine (COGENTIN) 1 MG tablet Take 1 mg by mouth 2 (two) times daily.    Historical Provider, MD  hydrOXYzine (VISTARIL) 25 MG capsule Take 25 mg by mouth 3 (three) times daily as needed for anxiety.    Historical Provider, MD  OLANZapine (ZYPREXA) 20 MG tablet Take 1 tablet (20 mg total) by mouth at bedtime. 06/12/16   Kerrie Buffalo, NP  oxyCODONE-acetaminophen (PERCOCET/ROXICET) 5-325 MG tablet Take 1 tablet by mouth every 8 (eight) hours as needed for severe pain. 12/09/16   Shary Decamp, PA-C    Family History Family History  Problem Relation Age of Onset  . Liver cancer      Social History Social History  Substance Use Topics  . Smoking status: Current Some Day Smoker    Packs/day: 0.25    Years: 20.00    Types: Cigarettes  . Smokeless tobacco: Never Used  . Alcohol use Yes     Comment: occ     Allergies   Depakote [divalproex sodium]; Lithium; Nsaids; Risperdal [risperidone]; and Nsaids   Review of Systems Review  of Systems  Constitutional: Negative for fever.  Skin: Positive for wound.     Physical Exam Updated Vital Signs BP 139/96   Pulse 104   Temp 97.9 F (36.6 C) (Oral)   Resp 18   SpO2 98%   Physical Exam  Constitutional: He is oriented to person, place, and time. He appears well-developed and well-nourished. No distress.  HENT:  Head: Normocephalic and atraumatic.  Cardiovascular: Normal rate, regular rhythm and normal heart sounds.   No murmur heard. Pulmonary/Chest: Effort normal and breath sounds  normal. No respiratory distress.  Neurological: He is alert and oriented to person, place, and time.  Skin: Skin is warm and dry.  Abdomen with healing, scabbed over wound. No surrounding erythema. No drainage.   Nursing note and vitals reviewed.    ED Treatments / Results  Labs (all labs ordered are listed, but only abnormal results are displayed) Labs Reviewed - No data to display  EKG  EKG Interpretation None       Radiology No results found.  Procedures .Suture Removal Date/Time: 12/17/2016 9:03 AM Performed by: Seward Speck Authorized by: Seward Speck   Consent:    Consent obtained:  Verbal   Consent given by:  Patient   Risks discussed:  Pain, bleeding and wound separation Universal protocol:    Patient identity confirmed:  Verbally with patient Location:    Location:  Trunk   Trunk location:  Abdomen Procedure details:    Wound appearance:  No signs of infection and clean   Number of sutures removed:  1 Post-procedure details:    Post-removal:  Dressing applied   Patient tolerance of procedure:  Tolerated well, no immediate complications   (including critical care time)  Medications Ordered in ED Medications - No data to display   Initial Impression / Assessment and Plan / ED Course  I have reviewed the triage vital signs and the nursing notes.  Pertinent labs & imaging results that were available during my care of the patient were reviewed by me and considered in my medical decision making (see chart for details).     [name] is a 36 y.o. male who presents to ED for suture removal and Wound check. Suture removal as dictated above. Patient tolerated well. Wound is healing as expected with no signs of infection. Continue home wound care instructions discussed. All questions answered.   Final Clinical Impressions(s) / ED Diagnoses   Final diagnoses:  None    New Prescriptions New Prescriptions   No medications on file       [facility] Zakyia Gagan, PA-C 12/17/16 [id number]    Julianne Rice, MD 12/19/16 2059

## 2016-12-17 NOTE — ED Notes (Signed)
Declined W/C at D/C and was escorted to lobby by RN. 

## 2016-12-17 NOTE — ED Triage Notes (Signed)
Patient here to have sutures removed from abdomen. In place x 7 days, sutures due to dog bite 1 week ago, no complaints

## 2016-12-17 NOTE — Discharge Instructions (Signed)
Keep wound clean and dry. Return for new or worsening symptoms, any additional concerns.

## 2017-12-27 ENCOUNTER — Emergency Department (HOSPITAL_COMMUNITY)
Admission: EM | Admit: 2017-12-27 | Discharge: 2017-12-27 | Disposition: A | Discharge status: Home / Self Care | Payer: Medicare Other | Attending: Emergency Medicine | Admitting: Emergency Medicine

## 2017-12-27 ENCOUNTER — Other Ambulatory Visit: Payer: Self-pay

## 2017-12-27 ENCOUNTER — Encounter (HOSPITAL_COMMUNITY): Payer: Self-pay | Admitting: Emergency Medicine

## 2017-12-27 DIAGNOSIS — K51911 Ulcerative colitis, unspecified with rectal bleeding: Secondary | ICD-10-CM

## 2017-12-27 DIAGNOSIS — F17210 Nicotine dependence, cigarettes, uncomplicated: Secondary | ICD-10-CM | POA: Diagnosis not present

## 2017-12-27 DIAGNOSIS — F1410 Cocaine abuse, uncomplicated: Secondary | ICD-10-CM | POA: Diagnosis not present

## 2017-12-27 DIAGNOSIS — R109 Unspecified abdominal pain: Secondary | ICD-10-CM | POA: Diagnosis not present

## 2017-12-27 DIAGNOSIS — Z79899 Other long term (current) drug therapy: Secondary | ICD-10-CM | POA: Diagnosis not present

## 2017-12-27 DIAGNOSIS — F1210 Cannabis abuse, uncomplicated: Secondary | ICD-10-CM | POA: Diagnosis not present

## 2017-12-27 DIAGNOSIS — R197 Diarrhea, unspecified: Secondary | ICD-10-CM | POA: Diagnosis not present

## 2017-12-27 DIAGNOSIS — K625 Hemorrhage of anus and rectum: Secondary | ICD-10-CM | POA: Diagnosis present

## 2017-12-27 LAB — COMPREHENSIVE METABOLIC PANEL
ALT: 27 U/L (ref 17–63)
AST: 27 U/L (ref 15–41)
Albumin: 4.2 g/dL (ref 3.5–5.0)
Alkaline Phosphatase: 92 U/L (ref 38–126)
Anion gap: 11 (ref 5–15)
BUN: 12 mg/dL (ref 6–20)
CO2: 23 mmol/L (ref 22–32)
Calcium: 9.3 mg/dL (ref 8.9–10.3)
Chloride: 103 mmol/L (ref 101–111)
Creatinine, Ser: 0.96 mg/dL (ref 0.61–1.24)
GFR calc Af Amer: >60 mL/min (ref >60)
GFR calc non Af Amer: >60 mL/min (ref >60)
Glucose, Bld: 112 mg/dL — ABNORMAL HIGH (ref 65–99)
Potassium: 4.1 mmol/L (ref 3.5–5.1)
Sodium: 137 mmol/L (ref 135–145)
Total Bilirubin: 0.5 mg/dL (ref 0.3–1.2)
Total Protein: 8.1 g/dL (ref 6.5–8.1)

## 2017-12-27 LAB — URINALYSIS, ROUTINE W REFLEX MICROSCOPIC
Bilirubin Urine: NEGATIVE
Glucose, UA: NEGATIVE mg/dL
Hgb urine dipstick: NEGATIVE
Ketones, ur: NEGATIVE mg/dL
Leukocytes, UA: NEGATIVE
Nitrite: NEGATIVE
Protein, ur: NEGATIVE mg/dL
Specific Gravity, Urine: 1.026 (ref 1.005–1.030)
pH: 5 (ref 5.0–8.0)

## 2017-12-27 LAB — CBC
HCT: 48.8 % (ref 39.0–52.0)
Hemoglobin: 16.4 g/dL (ref 13.0–17.0)
MCH: 29.2 pg (ref 26.0–34.0)
MCHC: 33.6 g/dL (ref 30.0–36.0)
MCV: 87 fL (ref 78.0–100.0)
Platelets: 341 10*3/uL (ref 150–400)
RBC: 5.61 MIL/uL (ref 4.22–5.81)
RDW: 14.3 % (ref 11.5–15.5)
WBC: 11.8 10*3/uL — ABNORMAL HIGH (ref 4.0–10.5)

## 2017-12-27 LAB — LIPASE, BLOOD: Lipase: 22 U/L (ref 11–51)

## 2017-12-27 MED ORDER — HYDROCODONE-ACETAMINOPHEN 5-325 MG PO TABS: ORAL_TABLET | ORAL | 0 refills | Status: DC

## 2017-12-27 MED ORDER — METRONIDAZOLE 500 MG PO TABS
500.0000 mg | ORAL_TABLET | Freq: Once | ORAL | Status: AC
  Administered 2017-12-27: 500 mg via ORAL
  Filled 2017-12-27: qty 1

## 2017-12-27 MED ORDER — SODIUM CHLORIDE 0.9 % IV BOLUS (SEPSIS)
1000.0000 mL | Freq: Once | INTRAVENOUS | Status: AC
  Administered 2017-12-27: 1000 mL via INTRAVENOUS

## 2017-12-27 MED ORDER — PREDNISONE 20 MG PO TABS
60.0000 mg | ORAL_TABLET | Freq: Once | ORAL | Status: AC
Start: 2017-12-27 — End: 2017-12-27
  Administered 2017-12-27: 60 mg via ORAL
  Filled 2017-12-27: qty 3

## 2017-12-27 MED ORDER — PREDNISONE 20 MG PO TABS: 60.0000 mg | ORAL_TABLET | Freq: Every day | ORAL | 0 refills | Status: DC

## 2017-12-27 MED ORDER — CIPROFLOXACIN HCL 500 MG PO TABS
500.0000 mg | ORAL_TABLET | Freq: Once | ORAL | Status: AC
  Administered 2017-12-27: 500 mg via ORAL
  Filled 2017-12-27: qty 1

## 2017-12-27 MED ORDER — AMOXICILLIN-POT CLAVULANATE 875-125 MG PO TABS: 1.0000 | ORAL_TABLET | Freq: Two times a day (BID) | ORAL | 0 refills | Status: DC

## 2017-12-27 MED ORDER — ONDANSETRON HCL 4 MG/2ML IJ SOLN
4.0000 mg | Freq: Once | INTRAMUSCULAR | Status: AC
  Administered 2017-12-27: 4 mg via INTRAVENOUS
  Filled 2017-12-27: qty 2

## 2017-12-27 MED ORDER — HYDROCODONE-ACETAMINOPHEN 5-325 MG PO TABS
1.0000 | ORAL_TABLET | Freq: Once | ORAL | Status: AC
  Administered 2017-12-27: 1 via ORAL
  Filled 2017-12-27: qty 1

## 2017-12-27 NOTE — Discharge Instructions (Signed)
Please follow with your primary care doctor in the next 2 days for a check-up. They must obtain records for further management.  ° °Do not hesitate to return to the Emergency Department for any new, worsening or concerning symptoms.  ° °

## 2017-12-27 NOTE — ED Triage Notes (Signed)
Patient complains of blood in stool for the last few days, history of ulcerative colitis and states this feels like a flare up of that. Also complains of emesis.

## 2017-12-27 NOTE — ED Provider Notes (Signed)
[facility] EMERGENCY DEPARTMENT Provider Note   CSN: [id number] Arrival date & time: 12/27/17  0174     History   Chief Complaint Chief Complaint  Patient presents with  . Rectal Bleeding    HPI  Blood pressure (!) 124/97, pulse (!) 115, temperature 98 F (36.7 C), temperature source Oral, resp. rate 18, height 6' (1.829 m), weight 104.3 kg (230 lb), SpO2 96 %.  [name] is a 37 y.o. male with past medical history significant for schizophrenia, ulcerative colitis, also has a history of Crohn's disease complaining of diarrhea with bright red blood per rectum over the course of 3 days with diffuse abdominal pain.  States history is consistent with prior ulcerative colitis exacerbations.  He does not follow regularly with GI.  He is on no medications for the ulcerative colitis.   Past Medical History:  Diagnosis Date  . Crohn's disease ([place])   . GI (gastrointestinal bleed) 04/2013  . Multiple myeloma ([place])   . Schizophrenia ([place])   . Sickle cell anemia (HCC)   . Suicide attempt by drug ingestion ([place])   . Ulcerative colitis     Patient Active Problem List   Diagnosis Date Noted  . Non compliance w medication regimen   . Cocaine use disorder, moderate, dependence ([place]) 06/09/2016  . Cannabis use disorder, severe, dependence ([place]) 06/09/2016  . Cocaine dependence with cocaine-induced mood disorder ([place]) 03/19/2016  . Cocaine-induced mood disorder ([place]) 03/03/2016  . Cocaine abuse ([place]) 03/03/2016  . Suicidal ideation   . Reflux esophagitis 05/24/2013  . Other specified gastritis without mention of hemorrhage 05/24/2013  . Nausea with vomiting 05/23/2013  . Anemia, unspecified 05/23/2013  . Nonspecific abnormal finding in stool contents 05/23/2013  . Paranoid schizophrenia ([place])   . Ulcerative colitis ([place])   . SUBSTANCE ABUSE, MULTIPLE 12/31/2007    Past Surgical History:  Procedure Laterality Date  . ESOPHAGOGASTRODUODENOSCOPY N/A 05/24/2013     Procedure: ESOPHAGOGASTRODUODENOSCOPY (EGD);  Surgeon: Ladene Artist, MD;  Location[facility] ENDOSCOPY;  Service: Endoscopy;  Laterality: N/A;  . FLEXIBLE SIGMOIDOSCOPY  12/05/2011   Procedure: FLEXIBLE SIGMOIDOSCOPY;  Surgeon: Beryle Beams, MD;  Location: [facility] ENDOSCOPY;  Service: Endoscopy;  Laterality: N/A;  . MANDIBLE FRACTURE SURGERY         Home Medications    Prior to Admission medications   Medication Sig Start Date End Date Taking? Authorizing Provider  amoxicillin-clavulanate (AUGMENTIN) 875-125 MG tablet Take 1 tablet by mouth 2 (two) times daily. One tab po bid x 10 days 12/27/17   Jumaane Weatherford, Elmyra Ricks, PA-C  benztropine (COGENTIN) 1 MG tablet Take 1 mg by mouth 2 (two) times daily.    [provider]  HYDROcodone-acetaminophen (NORCO/VICODIN) 5-325 MG tablet Take 1-2 tablets by mouth every 6 hours as needed for pain and/or cough. 12/27/17   Jasani Dolney, Elmyra Ricks, PA-C  hydrOXYzine (VISTARIL) 25 MG capsule Take 25 mg by mouth 3 (three) times daily as needed for anxiety.    [provider]  OLANZapine (ZYPREXA) 20 MG tablet Take 1 tablet (20 mg total) by mouth at bedtime. 06/12/16   Kerrie Buffalo, NP  oxyCODONE-acetaminophen (PERCOCET/ROXICET) 5-325 MG tablet Take 1 tablet by mouth every 8 (eight) hours as needed for severe pain. 12/09/16   Shary Decamp, PA-C  predniSONE (DELTASONE) 20 MG tablet Take 3 tablets (60 mg total) by mouth daily. Take 60 mg by mouth daily week 1, then 19m by mouth daily for week 2, then 232mdaily for week 3 12/27/17  Hester Joslin, Elmyra Ricks, PA-C    Family History Family History  Problem Relation Age of Onset  . Liver cancer Unknown     Social History Social History   Tobacco Use  . Smoking status: Current Some Day Smoker    Packs/day: 0.25    Years: 20.00    Pack years: 5.00    Types: Cigarettes  . Smokeless tobacco: Never Used  Substance Use Topics  . Alcohol use: Yes    Comment: occ  . Drug use: Yes    Types: Cocaine, Marijuana     Comment: quit 1 mo ago     Allergies   Depakote [divalproex sodium]; Lithium; Nsaids; Risperdal [risperidone]; and Nsaids   Review of Systems Review of Systems  A complete review of systems was obtained and all systems are negative except as noted in the HPI and PMH.   Physical Exam Updated Vital Signs BP 127/85 (BP Location: Right Arm)   Pulse (!) 101   Temp 98 F (36.7 C) (Oral)   Resp 16   Ht 6' (1.829 m)   Wt 104.3 kg (230 lb)   SpO2 95%   BMI 31.19 kg/m   Physical Exam  Constitutional: He is oriented to person, place, and time. He appears well-developed and well-nourished. No distress.  HENT:  Head: Normocephalic and atraumatic.  Mouth/Throat: Oropharynx is clear and moist.  No significant conjunctival pallor  Eyes: Conjunctivae and EOM are normal. Pupils are equal, round, and reactive to light.  Neck: Normal range of motion.  Cardiovascular: Normal rate, regular rhythm and intact distal pulses.  Pulmonary/Chest: Effort normal and breath sounds normal.  Abdominal: Soft. There is tenderness.  Normoactive bowel sounds, mild diffuse tenderness to palpation with no guarding or rebound.  Musculoskeletal: Normal range of motion.  Neurological: He is alert and oriented to person, place, and time.  Skin: He is not diaphoretic.  Psychiatric: He has a normal mood and affect.  Nursing note and vitals reviewed.    ED Treatments / Results  Labs (all labs ordered are listed, but only abnormal results are displayed) Labs Reviewed  COMPREHENSIVE METABOLIC PANEL - Abnormal; Notable for the following components:      Result Value   Glucose, Bld 112 (*)    All other components within normal limits  CBC - Abnormal; Notable for the following components:   WBC 11.8 (*)    All other components within normal limits  LIPASE, BLOOD  URINALYSIS, ROUTINE W REFLEX MICROSCOPIC    EKG  EKG Interpretation None       Radiology No results found.  Procedures Procedures  (including critical care time)  Medications Ordered in ED Medications  HYDROcodone-acetaminophen (NORCO/VICODIN) 5-325 MG per tablet 1 tablet (1 tablet Oral Given 12/27/17 1056)  predniSONE (DELTASONE) tablet 60 mg (60 mg Oral Given 12/27/17 1055)  ondansetron (ZOFRAN) injection 4 mg (4 mg Intravenous Given 12/27/17 1140)  sodium chloride 0.9 % bolus 1,000 mL (1,000 mLs Intravenous New Bag/Given 12/27/17 1141)  ciprofloxacin (CIPRO) tablet 500 mg (500 mg Oral Given 12/27/17 1055)  metroNIDAZOLE (FLAGYL) tablet 500 mg (500 mg Oral Given 12/27/17 1055)     Initial Impression / Assessment and Plan / ED Course  I have reviewed the triage vital signs and the nursing notes.  Pertinent labs & imaging results that were available during my care of the patient were reviewed by me and considered in my medical decision making (see chart for details).     Vitals:   12/27/17 4098 12/27/17 1191  12/27/17 1108  BP: (!) 124/97  127/85  Pulse: (!) 115  (!) 101  Resp: 18  16  Temp: 98 F (36.7 C)    TempSrc: Oral    SpO2: 96%  95%  Weight:  104.3 kg (230 lb)   Height:  6' (1.829 m)     Medications  HYDROcodone-acetaminophen (NORCO/VICODIN) 5-325 MG per tablet 1 tablet (1 tablet Oral Given 12/27/17 1056)  predniSONE (DELTASONE) tablet 60 mg (60 mg Oral Given 12/27/17 1055)  ondansetron (ZOFRAN) injection 4 mg (4 mg Intravenous Given 12/27/17 1140)  sodium chloride 0.9 % bolus 1,000 mL (1,000 mLs Intravenous New Bag/Given 12/27/17 1141)  ciprofloxacin (CIPRO) tablet 500 mg (500 mg Oral Given 12/27/17 1055)  metroNIDAZOLE (FLAGYL) tablet 500 mg (500 mg Oral Given 12/27/17 1055)    [name] is 37 y.o. male presenting with diarrhea and rectal bleeding onset 3 days ago.  Consistent with prior ulcerative colitis exacerbations.  Abdominal exam is benign, initially patient is tachycardic however this is largely resolved on my exam.  Blood work reassuring with no significant anemia/leukocytosis.  After  patient is rested in the ED heart rate has improved to 101.  I have contacted his mother who is his legal guardian and advised her the patient will be discharged with prescriptions for prednisone, antibiotics and pain, I have advised her it is very important that she help him set up an appointment for gastroenterology.  Extensive discussion of return precautions that mother verbalized back to me.  Evaluation does not show pathology that would require ongoing emergent intervention or inpatient treatment. Pt is hemodynamically stable and mentating appropriately. Discussed findings and plan with patient/guardian, who agrees with care plan. All questions answered. Return precautions discussed and outpatient follow up given.      Final Clinical Impressions(s) / ED Diagnoses   Final diagnoses:  Ulcerative colitis with rectal bleeding, unspecified location [facility])    ED Discharge Orders        Ordered    amoxicillin-clavulanate (AUGMENTIN) 875-125 MG tablet  2 times daily     12/27/17 1220    predniSONE (DELTASONE) 20 MG tablet  Daily     12/27/17 1220    HYDROcodone-acetaminophen (NORCO/VICODIN) 5-325 MG tablet     12/27/17 1220       Rodger Giangregorio, Charna [name] 12/27/17 1241    Virgel Manifold, MD 12/27/17 1515

## 2018-02-28 ENCOUNTER — Encounter (HOSPITAL_COMMUNITY): Payer: Self-pay | Admitting: Nurse Practitioner

## 2018-02-28 ENCOUNTER — Other Ambulatory Visit: Payer: Self-pay

## 2018-02-28 ENCOUNTER — Emergency Department (HOSPITAL_COMMUNITY)
Admission: EM | Admit: 2018-02-28 | Discharge: 2018-03-01 | Disposition: A | Discharge status: Home / Self Care | Payer: Medicare Other | Attending: Emergency Medicine | Admitting: Emergency Medicine

## 2018-02-28 DIAGNOSIS — F17210 Nicotine dependence, cigarettes, uncomplicated: Secondary | ICD-10-CM | POA: Insufficient documentation

## 2018-02-28 DIAGNOSIS — T1491XA Suicide attempt, initial encounter: Secondary | ICD-10-CM | POA: Insufficient documentation

## 2018-02-28 DIAGNOSIS — R45851 Suicidal ideations: Secondary | ICD-10-CM | POA: Diagnosis not present

## 2018-02-28 DIAGNOSIS — Y939 Activity, unspecified: Secondary | ICD-10-CM | POA: Insufficient documentation

## 2018-02-28 DIAGNOSIS — Y999 Unspecified external cause status: Secondary | ICD-10-CM | POA: Diagnosis not present

## 2018-02-28 DIAGNOSIS — F200 Paranoid schizophrenia: Secondary | ICD-10-CM | POA: Diagnosis present

## 2018-02-28 DIAGNOSIS — F209 Schizophrenia, unspecified: Secondary | ICD-10-CM | POA: Insufficient documentation

## 2018-02-28 DIAGNOSIS — F1424 Cocaine dependence with cocaine-induced mood disorder: Secondary | ICD-10-CM | POA: Diagnosis present

## 2018-02-28 DIAGNOSIS — W260XXA Contact with knife, initial encounter: Secondary | ICD-10-CM | POA: Diagnosis not present

## 2018-02-28 DIAGNOSIS — Y929 Unspecified place or not applicable: Secondary | ICD-10-CM | POA: Insufficient documentation

## 2018-02-28 DIAGNOSIS — R9431 Abnormal electrocardiogram [ECG] [EKG]: Secondary | ICD-10-CM | POA: Diagnosis not present

## 2018-02-28 DIAGNOSIS — Z79899 Other long term (current) drug therapy: Secondary | ICD-10-CM | POA: Insufficient documentation

## 2018-02-28 DIAGNOSIS — F1290 Cannabis use, unspecified, uncomplicated: Secondary | ICD-10-CM | POA: Diagnosis not present

## 2018-02-28 DIAGNOSIS — F329 Major depressive disorder, single episode, unspecified: Secondary | ICD-10-CM | POA: Diagnosis present

## 2018-02-28 LAB — RAPID URINE DRUG SCREEN, HOSP PERFORMED
AMPHETAMINES: NOT DETECTED
Barbiturates: NOT DETECTED
Benzodiazepines: NOT DETECTED
COCAINE: POSITIVE — AB
OPIATES: NOT DETECTED
Tetrahydrocannabinol: POSITIVE — AB

## 2018-02-28 LAB — COMPREHENSIVE METABOLIC PANEL
ALT: 26 U/L (ref 17–63)
AST: 26 U/L (ref 15–41)
Albumin: 3.5 g/dL (ref 3.5–5.0)
Alkaline Phosphatase: 74 U/L (ref 38–126)
Anion gap: 10 (ref 5–15)
BUN: 15 mg/dL (ref 6–20)
CHLORIDE: 109 mmol/L (ref 101–111)
CO2: 23 mmol/L (ref 22–32)
Calcium: 8.9 mg/dL (ref 8.9–10.3)
Creatinine, Ser: 1.12 mg/dL (ref 0.61–1.24)
GFR calc Af Amer: >60 mL/min (ref >60)
Glucose, Bld: 163 mg/dL — ABNORMAL HIGH (ref 65–99)
POTASSIUM: 3.7 mmol/L (ref 3.5–5.1)
SODIUM: 142 mmol/L (ref 135–145)
Total Bilirubin: 0.3 mg/dL (ref 0.3–1.2)
Total Protein: 7.1 g/dL (ref 6.5–8.1)

## 2018-02-28 LAB — ACETAMINOPHEN LEVEL: Acetaminophen (Tylenol), Serum: <10 ug/mL — ABNORMAL LOW (ref 10–30)

## 2018-02-28 LAB — CBC
HCT: 46 % (ref 39.0–52.0)
Hemoglobin: 15.2 g/dL (ref 13.0–17.0)
MCH: 29.3 pg (ref 26.0–34.0)
MCHC: 33 g/dL (ref 30.0–36.0)
MCV: 88.8 fL (ref 78.0–100.0)
PLATELETS: 269 10*3/uL (ref 150–400)
RBC: 5.18 MIL/uL (ref 4.22–5.81)
RDW: 14.8 % (ref 11.5–15.5)
WBC: 9.5 10*3/uL (ref 4.0–10.5)

## 2018-02-28 LAB — ETHANOL

## 2018-02-28 LAB — SALICYLATE LEVEL

## 2018-02-28 MED ORDER — ZOLPIDEM TARTRATE 5 MG PO TABS: 5.0000 mg | ORAL_TABLET | Freq: Every evening | ORAL | Status: DC | PRN

## 2018-02-28 MED ORDER — HYDROXYZINE PAMOATE 25 MG PO CAPS
25.0000 mg | ORAL_CAPSULE | Freq: Three times a day (TID) | ORAL | Status: DC | PRN
  Filled 2018-02-28: qty 1

## 2018-02-28 MED ORDER — STERILE WATER FOR INJECTION IJ SOLN
INTRAMUSCULAR | Status: AC
  Administered 2018-02-28: 1.2 mL
  Filled 2018-02-28: qty 10

## 2018-02-28 MED ORDER — BENZTROPINE MESYLATE 1 MG PO TABS
1.0000 mg | ORAL_TABLET | Freq: Two times a day (BID) | ORAL | Status: DC
  Administered 2018-02-28 – 2018-03-01 (×3): 1 mg via ORAL
  Filled 2018-02-28 (×3): qty 1

## 2018-02-28 MED ORDER — HYDROXYZINE HCL 25 MG PO TABS: 25.0000 mg | ORAL_TABLET | Freq: Three times a day (TID) | ORAL | Status: DC | PRN

## 2018-02-28 MED ORDER — ZIPRASIDONE MESYLATE 20 MG IM SOLR
10.0000 mg | Freq: Once | INTRAMUSCULAR | Status: AC
  Administered 2018-02-28: 10 mg via INTRAMUSCULAR
  Filled 2018-02-28: qty 20

## 2018-02-28 MED ORDER — ALUM & MAG HYDROXIDE-SIMETH 200-200-20 MG/5ML PO SUSP: 30.0000 mL | Freq: Four times a day (QID) | ORAL | Status: DC | PRN

## 2018-02-28 MED ORDER — ACETAMINOPHEN 325 MG PO TABS: 650.0000 mg | ORAL_TABLET | ORAL | Status: DC | PRN

## 2018-02-28 MED ORDER — OLANZAPINE 10 MG PO TABS
20.0000 mg | ORAL_TABLET | Freq: Every day | ORAL | Status: DC
  Administered 2018-02-28: 20 mg via ORAL
  Filled 2018-02-28: qty 2

## 2018-02-28 NOTE — ED Notes (Signed)
[phone]

## 2018-02-28 NOTE — ED Triage Notes (Signed)
Patient is SI patient has been hearing voices. Denies HI. States he can not control his thoughts. Patient has not been taking his prescribed medications in a while and his thoughts are getting worse. Patient is taking about random things and is easily distracted.

## 2018-02-28 NOTE — ED Notes (Signed)
Bed: WR04 Expected date:  Expected time:  Means of arrival:  Comments: Waiting on EVS

## 2018-02-28 NOTE — ED Provider Notes (Addendum)
[facility] DEPT Provider Note   CSN: [id number] Arrival date & time: 02/28/18  7939     History   Chief Complaint Chief Complaint  Patient presents with  . Suicidal    HPI [name] is a 37 y.o. male.  The history is provided by the patient. No language interpreter was used.  Mental Health Problem  Presenting symptoms: agitation, depression, hallucinations, paranoid behavior and suicide attempt   Patient accompanied by:  Parent Degree of incapacity (severity):  Severe Onset quality:  Gradual Duration:  2 days Timing:  Constant Progression:  Worsening Chronicity:  New Treatment compliance:  Most of the time Relieved by:  Nothing Worsened by:  Nothing Risk factors: hx of mental illness    Pt drank a bottle of household cleaner called awesome.   Past Medical History:  Diagnosis Date  . Crohn's disease ([place])   . GI (gastrointestinal bleed) 04/2013  . Multiple myeloma ([place])   . Schizophrenia ([place])   . Sickle cell anemia (HCC)   . Suicide attempt by drug ingestion ([place])   . Ulcerative colitis     Patient Active Problem List   Diagnosis Date Noted  . Non compliance w medication regimen   . Cocaine use disorder, moderate, dependence ([place]) 06/09/2016  . Cannabis use disorder, severe, dependence ([place]) 06/09/2016  . Cocaine dependence with cocaine-induced mood disorder ([place]) 03/19/2016  . Cocaine-induced mood disorder ([place]) 03/03/2016  . Cocaine abuse ([place]) 03/03/2016  . Suicidal ideation   . Reflux esophagitis 05/24/2013  . Other specified gastritis without mention of hemorrhage 05/24/2013  . Nausea with vomiting 05/23/2013  . Anemia, unspecified 05/23/2013  . Nonspecific abnormal finding in stool contents 05/23/2013  . Paranoid schizophrenia ([place])   . Ulcerative colitis ([place])   . SUBSTANCE ABUSE, MULTIPLE 12/31/2007    Past Surgical History:  Procedure Laterality Date  . ESOPHAGOGASTRODUODENOSCOPY N/A 05/24/2013   Procedure:  ESOPHAGOGASTRODUODENOSCOPY (EGD);  Surgeon: Ladene Artist, MD;  Location: [facility] ENDOSCOPY;  Service: Endoscopy;  Laterality: N/A;  . FLEXIBLE SIGMOIDOSCOPY  12/05/2011   Procedure: FLEXIBLE SIGMOIDOSCOPY;  Surgeon: Beryle Beams, MD;  Location: [facility] ENDOSCOPY;  Service: Endoscopy;  Laterality: N/A;  . MANDIBLE FRACTURE SURGERY          Home Medications    Prior to Admission medications   Medication Sig Start Date End Date Taking? Authorizing Provider  amoxicillin-clavulanate (AUGMENTIN) 875-125 MG tablet Take 1 tablet by mouth 2 (two) times daily. One tab po bid x 10 days 12/27/17   Pisciotta, Elmyra Ricks, PA-C  benztropine (COGENTIN) 1 MG tablet Take 1 mg by mouth 2 (two) times daily.    [provider]  HYDROcodone-acetaminophen (NORCO/VICODIN) 5-325 MG tablet Take 1-2 tablets by mouth every 6 hours as needed for pain and/or cough. 12/27/17   Pisciotta, Elmyra Ricks, PA-C  hydrOXYzine (VISTARIL) 25 MG capsule Take 25 mg by mouth 3 (three) times daily as needed for anxiety.    [provider]  OLANZapine (ZYPREXA) 20 MG tablet Take 1 tablet (20 mg total) by mouth at bedtime. 06/12/16   Kerrie Buffalo, NP  oxyCODONE-acetaminophen (PERCOCET/ROXICET) 5-325 MG tablet Take 1 tablet by mouth every 8 (eight) hours as needed for severe pain. 12/09/16   Shary Decamp, PA-C  predniSONE (DELTASONE) 20 MG tablet Take 3 tablets (60 mg total) by mouth daily. Take 60 mg by mouth daily week 1, then 69m by mouth daily for week 2, then 213mdaily for week 3 12/27/17   Pisciotta, NiElmyra RicksPA-C  Family History Family History  Problem Relation Age of Onset  . Liver cancer Unknown     Social History Social History   Tobacco Use  . Smoking status: Current Some Day Smoker    Packs/day: 0.25    Years: 20.00    Pack years: 5.00    Types: Cigarettes  . Smokeless tobacco: Never Used  Substance Use Topics  . Alcohol use: Yes    Comment: occ  . Drug use: Yes    Types: Cocaine, Marijuana    Comment:  quit 1 mo ago     Allergies   Depakote [divalproex sodium]; Lithium; Nsaids; Risperdal [risperidone]; and Nsaids   Review of Systems Review of Systems  Psychiatric/Behavioral: Positive for agitation, hallucinations and paranoia.  All other systems reviewed and are negative.    Physical Exam Updated Vital Signs BP (!) 157/101   Pulse (!) 108   Temp 97.9 F (36.6 C) (Oral)   Resp 18   Ht 6' (1.829 m)   Wt 108.4 kg (239 lb)   SpO2 98%   BMI 32.41 kg/m   Physical Exam  Constitutional: He appears well-developed and well-nourished.  HENT:  Head: Normocephalic and atraumatic.  Eyes: Conjunctivae are normal.  Neck: Neck supple.  Cardiovascular: Normal rate and regular rhythm.  No murmur heard. Pulmonary/Chest: Effort normal and breath sounds normal. No respiratory distress.  Abdominal: Soft. There is no tenderness.  Musculoskeletal: He exhibits no edema.  Neurological: He is alert.  Skin: Skin is warm and dry.  Psychiatric:  Anxious, agitated,  Pt talking to himself  Nursing note and vitals reviewed.    ED Treatments / Results  Labs (all labs ordered are listed, but only abnormal results are displayed) Labs Reviewed  COMPREHENSIVE METABOLIC PANEL - Abnormal; Notable for the following components:      Result Value   Glucose, Bld 163 (*)    All other components within normal limits  ACETAMINOPHEN LEVEL - Abnormal; Notable for the following components:   Acetaminophen (Tylenol), Serum <10 (*)    All other components within normal limits  RAPID URINE DRUG SCREEN, HOSP PERFORMED - Abnormal; Notable for the following components:   Cocaine POSITIVE (*)    Tetrahydrocannabinol POSITIVE (*)    All other components within normal limits  ETHANOL  SALICYLATE LEVEL  CBC    EKG EKG Interpretation  Date/Time:  Thursday Feb 28 2018 12:26:08 EDT Ventricular Rate:  98 PR Interval:  168 QRS Duration: 80 QT Interval:  362 QTC Calculation: 462 R Axis:   16 Text  Interpretation:  Normal sinus rhythm Biatrial enlargement Abnormal ECG stable no sig change from previous Confirmed by Charlesetta Shanks [phone]) on 02/28/2018 2:21:45 PM   Radiology No results found.  Procedures Procedures (including critical care time)  Medications Ordered in ED Medications  ziprasidone (GEODON) injection 10 mg (has no administration in time range)     Initial Impression / Assessment and Plan / ED Course  I have reviewed the triage vital signs and the nursing notes.  Pertinent labs & imaging results that were available during my care of the patient were reviewed by me and considered in my medical decision making (see chart for details).     MDM   I spoke to poison control.  If EKG and labs are normal.  Pt can be medically cleared for Psychiatric treatment. Pt's drug screen is positive for cocaine and THC.  Pt medically clear for TTS consult   Final Clinical Impressions(s) / ED Diagnoses  Final diagnoses:  Suicidal behavior with attempted self-injury ([place])  Suicidal ideations  Schizophrenia, unspecified type [facility])    ED Discharge Orders    None    TTS consult    Fransico Meadow, PA-C 02/28/18 [address] 02/28/18 1535    Charlesetta Shanks, MD 03/10/18 [phone]

## 2018-02-28 NOTE — ED Notes (Signed)
Received call from [place], [employer].  Information provided for questions asked.  Case closed by Poison Control.

## 2018-02-28 NOTE — ED Notes (Signed)
Bed: WTR5 Expected date:  Expected time:  Means of arrival:  Comments: 

## 2018-02-28 NOTE — BH Assessment (Addendum)
Assessment Note  [name] is a single 37 y.o. male presenting to [facility] with SI and report of hearing voices and seeing shadows. Pt states he has a plan to jump in front of a car of cut his arm with a knife; he reports "3 or 4" previous attempts to harm himself. Pt denies current and past HI. He states he has not been compliant recently with his psychiatric medications due to ACTT team bringing him incorrect bottles and he ran out of his meds. Pt reports he takes Haldol, Cogentin & another med he cannot remember. Pt states he feels depressed. He reports he uses marijuana about 1 x weekly. His drug screen was positive for cocaine. Pt attempted to be cooperative for assessment but had difficulty remaining awake for any length of time and had to be repeatedly awakened.     Diagnosis:  F20.9 Schizophrenia; F14.0 Cocaine use disorder Disposition: Per Gust Rung, NP, observe and monitor overnight for safety and stability  Past Medical History:  Past Medical History:  Diagnosis Date  . Crohn's disease ([place])   . GI (gastrointestinal bleed) 04/2013  . Multiple myeloma ([place])   . Schizophrenia ([place])   . Sickle cell anemia (HCC)   . Suicide attempt by drug ingestion ([place])   . Ulcerative colitis     Past Surgical History:  Procedure Laterality Date  . ESOPHAGOGASTRODUODENOSCOPY N/A 05/24/2013   Procedure: ESOPHAGOGASTRODUODENOSCOPY (EGD);  Surgeon: Ladene Artist, MD;  Location: [facility] ENDOSCOPY;  Service: Endoscopy;  Laterality: N/A;  . FLEXIBLE SIGMOIDOSCOPY  12/05/2011   Procedure: FLEXIBLE SIGMOIDOSCOPY;  Surgeon: Beryle Beams, MD;  Location: [facility] ENDOSCOPY;  Service: Endoscopy;  Laterality: N/A;  . MANDIBLE FRACTURE SURGERY      Family History:  Family History  Problem Relation Age of Onset  . Liver cancer Unknown     Social History:  reports that he has been smoking cigarettes.  He has a 5.00 pack-year smoking history. He has never used smokeless tobacco. He reports that he drinks alcohol.  He reports that he has current or past drug history. Drugs: Cocaine and Marijuana.  Additional Social History:  Alcohol / Drug Use Pain Medications: see MAR Prescriptions: see MAR Over the Counter: see MAR History of alcohol / drug use?: Yes Longest period of sobriety (when/how long): 4-6 years Negative Consequences of Use: Financial, Legal, Personal relationships, Work / School  CIWA: CIWA-Ar BP: 133/78 Pulse Rate: 96 COWS:    Allergies:  Allergies  Allergen Reactions  . Depakote [Divalproex Sodium] Other (See Comments)    Reaction:  Seizures   . Lithium Diarrhea and Other (See Comments)    Reaction:  Rectal bleeding   . Nsaids Other (See Comments)    Reaction:  Rectal bleeding   . Risperdal [Risperidone] Shortness Of Breath and Other (See Comments)    Reaction:  Chest pain   . Nsaids Itching    Home Medications:  (Not in a hospital admission)  OB/GYN Status:  No LMP for male patient.  General Assessment Data Location of Assessment: [facility] ED TTS Assessment: In system Is this a Tele or Face-to-Face Assessment?: Face-to-Face Is this an Initial Assessment or a Re-assessment for this encounter?: Initial Assessment Marital status: Single Living Arrangements: Parent(mother) Can pt return to current living arrangement?: Yes Admission Status: Voluntary Is patient capable of signing voluntary admission?: Yes Referral Source: Self/Family/Friend Insurance type: medicare     Crisis Care Plan Living Arrangements: Parent(mother) Name of Psychiatrist: [facility] team Name of Therapist: [facility]  Education Status Is patient currently in school?: No  Risk to self with the past 6 months Suicidal Ideation: Yes-Currently Present Has patient been a risk to self within the past 6 months prior to admission? : Yes Suicidal Intent: Yes-Currently Present Has patient had any suicidal intent within the past 6 months prior to admission? : Yes Is patient at risk for suicide?:  Yes Suicidal Plan?: Yes-Currently Present Has patient had any suicidal plan within the past 6 months prior to admission? : Yes Specify Current Suicidal Plan: jump in front of car; cut arm with knife Access to Means: Yes What has been your use of drugs/alcohol within the last 12 months?: THC 1x weekly Previous Attempts/Gestures: Yes How many times?: 4 Other Self Harm Risks: illicit drug use; noncompliant med Triggers for Past Attempts: Other (Comment)(depression) Intentional Self Injurious Behavior: None Family Suicide History: No Persecutory voices/beliefs?: No Depression: Yes Depression Symptoms: Despondent, Isolating, Fatigue, Feeling worthless/self pity Substance abuse history and/or treatment for substance abuse?: Yes  Risk to Others within the past 6 months Homicidal Ideation: No Does patient have any lifetime risk of violence toward others beyond the six months prior to admission? : Unknown Thoughts of Harm to Others: No Current Homicidal Intent: No Current Homicidal Plan: No Access to Homicidal Means: No History of harm to others?: No Assessment of Violence: None Noted Does patient have access to weapons?: No Criminal Charges Pending?: No Does patient have a court date: No(pt denies) Is patient on probation?: No  Psychosis Hallucinations: Auditory, Visual(shadows, voices) Delusions: None noted  Mental Status Report Appearance/Hygiene: Disheveled Eye Contact: Poor Motor Activity: Psychomotor retardation(sleepy) Speech: Incoherent, Unremarkable Level of Consciousness: Sleeping, Drowsy Mood: Pleasant Affect: Blunted Anxiety Level: None Thought Processes: Coherent Judgement: Partial Orientation: Person, Place, Time, Situation Obsessive Compulsive Thoughts/Behaviors: None  Cognitive Functioning Memory: Recent Intact, Remote Intact Is patient IDD: No Insight: Fair Impulse Control: Fair Appetite: Good Have you had any weight changes? : Loss Amount of the weight  change? (lbs): 15 lbs(intentional) Sleep: No Change Vegetative Symptoms: None  ADLScreening [facility] Assessment Services) Patient's cognitive ability adequate to safely complete daily activities?: Yes Patient able to express need for assistance with ADLs?: Yes Independently performs ADLs?: Yes (appropriate for developmental age)  Prior Inpatient Therapy Prior Inpatient Therapy: Yes Prior Therapy Dates: 2017 Prior Therapy Facilty/Provider(s): [facility] Reason for Treatment: MH & substance abuse  Prior Outpatient Therapy Prior Outpatient Therapy: Yes Prior Therapy Dates: 2019 Prior Therapy Facilty/Provider(s): [facility] Reason for Treatment: MH & substance abuse Does patient have an [facility] team?: Yes Does patient have Intensive In-House Services?  : No Does patient have [facility] services? : No Does patient have P4CC services?: No  ADL Screening (condition at time of admission) Patient's cognitive ability adequate to safely complete daily activities?: Yes Is the patient deaf or have difficulty hearing?: No Does the patient have difficulty seeing, even when wearing glasses/contacts?: No Does the patient have difficulty concentrating, remembering, or making decisions?: No Patient able to express need for assistance with ADLs?: Yes Does the patient have difficulty dressing or bathing?: No Independently performs ADLs?: Yes (appropriate for developmental age) Does the patient have difficulty walking or climbing stairs?: No Weakness of Legs: None Weakness of Arms/Hands: None  Home Assistive Devices/Equipment Home Assistive Devices/Equipment: None  Therapy Consults (therapy consults require a physician order) PT Evaluation Needed: No OT Evalulation Needed: No SLP Evaluation Needed: No Abuse/Neglect Assessment (Assessment to be complete while patient is alone) Abuse/Neglect Assessment Can Be Completed: Yes Physical  Abuse: Denies Verbal Abuse: Denies Sexual Abuse: Denies Exploitation of  patient/patient's resources: Denies Self-Neglect: Denies Values / Beliefs Cultural Requests During Hospitalization: None Spiritual Requests During Hospitalization: None Consults Spiritual Care Consult Needed: No Social Work Consult Needed: No            Disposition:  Disposition Initial Assessment Completed for this Encounter: Yes Disposition of Patient: (Observe overnight for safety and security)  On Site Evaluation by:   Reviewed with Physician:    Richardean Chimera 02/28/2018 2:07 PM

## 2018-03-01 DIAGNOSIS — F17210 Nicotine dependence, cigarettes, uncomplicated: Secondary | ICD-10-CM

## 2018-03-01 DIAGNOSIS — F1290 Cannabis use, unspecified, uncomplicated: Secondary | ICD-10-CM

## 2018-03-01 DIAGNOSIS — F1424 Cocaine dependence with cocaine-induced mood disorder: Secondary | ICD-10-CM

## 2018-03-01 NOTE — ED Notes (Signed)
NO RX GIVEN. ACT PERSON STEPHANIE HERE TO ASSIST WITH DISCHARGE. PT DISCHARGED WITHOUT EVENT. TOM ARRANGED DISCHARGE WITH ACT PERSON. PT COOPERATIVE AND AGREED WITH DISCHARGE PLANS.

## 2018-03-01 NOTE — ED Notes (Signed)
TTS AT BEDSIDE

## 2018-03-01 NOTE — Discharge Instructions (Signed)
For you ongoing behavioral health needs, you are advised to to continue treatment with the [facility] ACT Team:       [facility]      [address]      [place], [place] [address]      [phone]

## 2018-03-01 NOTE — BHH Suicide Risk Assessment (Signed)
Suicide Risk Assessment  Discharge Assessment   [facility] Discharge Suicide Risk Assessment   Principal Problem: Cocaine dependence with cocaine-induced mood disorder [facility]) Discharge Diagnoses:  Patient Active Problem List   Diagnosis Date Noted  . Cocaine dependence with cocaine-induced mood disorder ([place]) [F14.24] 03/19/2016    Priority: High  . Cocaine-induced mood disorder ([place]) [F14.94] 03/03/2016    Priority: High  . Cocaine abuse ([place]) [F14.10] 03/03/2016    Priority: High  . Paranoid schizophrenia ([place]) [F20.0]     Priority: High  . Non compliance w medication regimen [Z91.14]   . Cocaine use disorder, moderate, dependence ([place]) [F14.20] 06/09/2016  . Cannabis use disorder, severe, dependence ([place]) [F12.20] 06/09/2016  . Suicidal ideation [R45.851]   . Reflux esophagitis [K21.0] 05/24/2013  . Other specified gastritis without mention of hemorrhage [K29.60] 05/24/2013  . Nausea with vomiting [R11.2] 05/23/2013  . Anemia, unspecified [D64.9] 05/23/2013  . Nonspecific abnormal finding in stool contents [R19.5] 05/23/2013  . Ulcerative colitis ([place]) [K51.90]   . SUBSTANCE ABUSE, MULTIPLE [F19.10] 12/31/2007    Total Time spent with patient: 45 minutes  Musculoskeletal: Strength & Muscle Tone: within normal limits Gait & Station: normal Patient leans: N/A  Psychiatric Specialty Exam: Physical Exam  Constitutional: He is oriented to person, place, and time. He appears well-developed and well-nourished.  HENT:  Head: Normocephalic.  Neck: Normal range of motion.  Respiratory: Effort normal.  Musculoskeletal: Normal range of motion.  Neurological: He is alert and oriented to person, place, and time.  Psychiatric: He has a normal mood and affect. His speech is normal and behavior is normal. Judgment and thought content normal. Cognition and memory are normal.    Review of Systems  Psychiatric/Behavioral: Positive for substance abuse.  All other systems reviewed and are  negative.   Blood pressure 128/80, pulse 79, temperature 98.7 F (37.1 C), temperature source Oral, resp. rate 16, height 6' (1.829 m), weight 108.4 kg (239 lb), SpO2 98 %.Body mass index is 32.41 kg/m.  General Appearance: Casual  Eye Contact:  Good  Speech:  Normal Rate  Volume:  Normal  Mood:  Irritable  Affect:  Congruent  Thought Process:  Coherent and Descriptions of Associations: Intact  Orientation:  Full (Time, Place, and Person)  Thought Content:  WDL and Logical  Suicidal Thoughts:  No  Homicidal Thoughts:  No  Memory:  Immediate;   Good Recent;   Good Remote;   Good  Judgement:  Fair  Insight:  Fair  Psychomotor Activity:  Normal  Concentration:  Concentration: Good and Attention Span: Good  Recall:  Good  Fund of Knowledge:  Fair  Language:  Good  Akathisia:  No  Handed:  Right  AIMS (if indicated):     Assets:  Leisure Time Physical Health Resilience Social Support  ADL's:  Intact  Cognition:  WNL  Sleep:      Mental Status Per Nursing Assessment::   On Admission:   cocaine abuse with hallucinations  Demographic Factors:  Male  Loss Factors: NA  Historical Factors: NA  Risk Reduction Factors:   Sense of responsibility to family, Living with another person, especially a relative and Positive social support  Continued Clinical Symptoms:  Irritability   Cognitive Features That Contribute To Risk:  None    Suicide Risk:  Minimal: No identifiable suicidal ideation.  Patients presenting with no risk factors but with morbid ruminations; may be classified as minimal risk based on the severity of the depressive symptoms    Plan Of  Care/Follow-up recommendations:  Activity:  as tolerated Diet:  heart healthy diet  LORD, Theodoro Clock, NP 03/01/2018, 5:41 PM

## 2018-03-01 NOTE — Consult Note (Addendum)
[facility] Consult   Reason for Consult:  Cocaine abuse with hallucinations Referring Physician:  EDP Patient Identification: [name] MRN:  [id number] Principal Diagnosis: Cocaine dependence with cocaine-induced mood disorder [facility]) Diagnosis:   Patient Active Problem List   Diagnosis Date Noted  . Cocaine dependence with cocaine-induced mood disorder ([place]) [F14.24] 03/19/2016    Priority: High  . Cocaine-induced mood disorder ([place]) [F14.94] 03/03/2016    Priority: High  . Cocaine abuse ([place]) [F14.10] 03/03/2016    Priority: High  . Paranoid schizophrenia ([place]) [F20.0]     Priority: High  . Non compliance w medication regimen [Z91.14]   . Cocaine use disorder, moderate, dependence ([place]) [F14.20] 06/09/2016  . Cannabis use disorder, severe, dependence ([place]) [F12.20] 06/09/2016  . Suicidal ideation [R45.851]   . Reflux esophagitis [K21.0] 05/24/2013  . Other specified gastritis without mention of hemorrhage [K29.60] 05/24/2013  . Nausea with vomiting [R11.2] 05/23/2013  . Anemia, unspecified [D64.9] 05/23/2013  . Nonspecific abnormal finding in stool contents [R19.5] 05/23/2013  . Ulcerative colitis ([place]) [K51.90]   . SUBSTANCE ABUSE, MULTIPLE [F19.10] 12/31/2007    Total Time spent with patient: 45 minutes  Subjective:   [name] is a 37 y.o. male patient does not warrant admission.  HPI:  37 yo male who presented to the ED after using cocaine and hearing voices.  Today, he is clear and coherent with no hallucinations, suicidal/homicidal ideations, and withdrawal symptoms.  Peer support consult placed but he was not interested in rehab or recovery, does not feel he has an issue.  Stable for discharge.  Past Psychiatric History: substance abuse  Risk to Self: None Risk to Others: Homicidal Ideation: No Thoughts of Harm to Others: No Current Homicidal Intent: No Current Homicidal Plan: No Access to Homicidal Means: No History of harm to  others?: No Assessment of Violence: None Noted Does patient have access to weapons?: No Criminal Charges Pending?: No Does patient have a court date: No(pt denies) Prior Inpatient Therapy: Prior Inpatient Therapy: Yes Prior Therapy Dates: 2017 Prior Therapy Facilty/Provider(s): [facility] Reason for Treatment: MH & substance abuse Prior Outpatient Therapy: Prior Outpatient Therapy: Yes Prior Therapy Dates: 2019 Prior Therapy Facilty/Provider(s): [facility] Reason for Treatment: MH & substance abuse Does patient have an [facility]CT team?: Yes Does patient have Intensive In-House Services?  : No Does patient have [facility] services? : No Does patient have P4CC services?: No  Past Medical History:  Past Medical History:  Diagnosis Date  . Crohn's disease ([place])   . GI (gastrointestinal bleed) 04/2013  . Multiple myeloma ([place])   . Schizophrenia ([place])   . Sickle cell anemia (HCC)   . Suicide attempt by drug ingestion ([place])   . Ulcerative colitis     Past Surgical History:  Procedure Laterality Date  . ESOPHAGOGASTRODUODENOSCOPY N/A 05/24/2013   Procedure: ESOPHAGOGASTRODUODENOSCOPY (EGD);  Surgeon: Ladene Artist, MD;  Location: [facility] ENDOSCOPY;  Service: Endoscopy;  Laterality: N/A;  . FLEXIBLE SIGMOIDOSCOPY  12/05/2011   Procedure: FLEXIBLE SIGMOIDOSCOPY;  Surgeon: Beryle Beams, MD;  Location: [facility] ENDOSCOPY;  Service: Endoscopy;  Laterality: N/A;  . MANDIBLE FRACTURE SURGERY     Family History:  Family History  Problem Relation Age of Onset  . Liver cancer Unknown    Family Psychiatric  History: none Social History:  Social History   Substance and Sexual Activity  Alcohol Use Yes   Comment: occ     Social History   Substance and Sexual Activity  Drug Use Yes  .  Types: Cocaine, Marijuana   Comment: quit 1 mo ago    Social History   Socioeconomic History  . Marital status: Single    Spouse name: Not on file  . Number of children: Not on file  . Years of education: GED  . Highest  education level: Not on file  Occupational History  . Occupation: disabled  Social Needs  . Financial resource strain: Not on file  . Food insecurity:    Worry: Not on file    Inability: Not on file  . Transportation needs:    Medical: Not on file    Non-medical: Not on file  Tobacco Use  . Smoking status: Current Some Day Smoker    Packs/day: 0.25    Years: 20.00    Pack years: 5.00    Types: Cigarettes  . Smokeless tobacco: Never Used  Substance and Sexual Activity  . Alcohol use: Yes    Comment: occ  . Drug use: Yes    Types: Cocaine, Marijuana    Comment: quit 1 mo ago  . Sexual activity: Never    Birth control/protection: Abstinence  Lifestyle  . Physical activity:    Days per week: Not on file    Minutes per session: Not on file  . Stress: Not on file  Relationships  . Social connections:    Talks on phone: Not on file    Gets together: Not on file    Attends religious service: Not on file    Active member of club or organization: Not on file    Attends meetings of clubs or organizations: Not on file    Relationship status: Not on file  Other Topics Concern  . Not on file  Social History Narrative   [name] was recently released from prison, currently living at home with his mother.  He is disabled (due to mental illness?).    Additional Social History: N/A    Allergies:   Allergies  Allergen Reactions  . Depakote [Divalproex Sodium] Other (See Comments)    Reaction:  Seizures   . Lithium Diarrhea and Other (See Comments)    Reaction:  Rectal bleeding   . Nsaids Other (See Comments)    Reaction:  Rectal bleeding   . Risperdal [Risperidone] Shortness Of Breath and Other (See Comments)    Reaction:  Chest pain   . Nsaids Itching    Labs:  Results for orders placed or performed during the hospital encounter of 02/28/18 (from the past 48 hour(s))  Comprehensive metabolic panel     Status: Abnormal   Collection Time: 02/28/18 10:03 AM  Result  Value Ref Range   Sodium 142 135 - 145 mmol/L   Potassium 3.7 3.5 - 5.1 mmol/L   Chloride 109 101 - 111 mmol/L   CO2 23 22 - 32 mmol/L   Glucose, Bld 163 (H) 65 - 99 mg/dL   BUN 15 6 - 20 mg/dL   Creatinine, Ser 1.12 0.61 - 1.24 mg/dL   Calcium 8.9 8.9 - 10.3 mg/dL   Total Protein 7.1 6.5 - 8.1 g/dL   Albumin 3.5 3.5 - 5.0 g/dL   AST 26 15 - 41 U/L   ALT 26 17 - 63 U/L   Alkaline Phosphatase 74 38 - 126 U/L   Total Bilirubin 0.3 0.3 - 1.2 mg/dL   GFR calc non Af Amer >60 >60 mL/min   GFR calc Af Amer >60 >60 mL/min    Comment: (NOTE) The eGFR has been calculated  using the CKD EPI equation. This calculation has not been validated in all clinical situations. eGFR's persistently <60 mL/min signify possible Chronic Kidney Disease.    Anion gap 10 5 - 15    Comment: Performed at [facility], [place] [address] [place], [place] [address]  Ethanol     Status: None   Collection Time: 02/28/18 10:03 AM  Result Value Ref Range   Alcohol, Ethyl (B) <10 <10 mg/dL    Comment: (NOTE) Lowest detectable limit for serum alcohol is 10 mg/dL. For medical purposes only. Performed at [facility], [place] [address], [place], [place] [address]   Salicylate level     Status: None   Collection Time: 02/28/18 10:03 AM  Result Value Ref Range   Salicylate Lvl <0.3 2.8 - 30.0 mg/dL    Comment: Performed at [facility], [place] [address] [place], [place] [address]  Acetaminophen level     Status: Abnormal   Collection Time: 02/28/18 10:03 AM  Result Value Ref Range   Acetaminophen (Tylenol), Serum <10 (L) 10 - 30 ug/mL    Comment: (NOTE) Therapeutic concentrations vary significantly. A range of 10-30 ug/mL  may be an effective concentration for many patients. However, some  are best treated at concentrations outside of this range. Acetaminophen concentrations >150 ug/mL at 4 hours after ingestion  and >50 ug/mL at 12 hours after ingestion are  often associated with  toxic reactions. Performed at [facility], [place] [address], [place], [place] [address]   cbc     Status: None   Collection Time: 02/28/18 10:03 AM  Result Value Ref Range   WBC 9.5 4.0 - 10.5 K/uL   RBC 5.18 4.22 - 5.81 MIL/uL   Hemoglobin 15.2 13.0 - 17.0 g/dL   HCT 46.0 39.0 - 52.0 %   MCV 88.8 78.0 - 100.0 fL   MCH 29.3 26.0 - 34.0 pg   MCHC 33.0 30.0 - 36.0 g/dL   RDW 14.8 11.5 - 15.5 %   Platelets 269 150 - 400 K/uL    Comment: Performed at [facility], [place] [address] [place], [place] [address]  Rapid urine drug screen (hospital performed)     Status: Abnormal   Collection Time: 02/28/18 11:16 AM  Result Value Ref Range   Opiates NONE DETECTED NONE DETECTED   Cocaine POSITIVE (A) NONE DETECTED   Benzodiazepines NONE DETECTED NONE DETECTED   Amphetamines NONE DETECTED NONE DETECTED   Tetrahydrocannabinol POSITIVE (A) NONE DETECTED   Barbiturates NONE DETECTED NONE DETECTED    Comment: (NOTE) DRUG SCREEN FOR MEDICAL PURPOSES ONLY.  IF CONFIRMATION IS NEEDED FOR ANY PURPOSE, NOTIFY LAB WITHIN 5 DAYS. LOWEST DETECTABLE LIMITS FOR URINE DRUG SCREEN Drug Class                     Cutoff (ng/mL) Amphetamine and metabolites    1000 Barbiturate and metabolites    200 Benzodiazepine                 803 Tricyclics and metabolites     300 Opiates and metabolites        300 Cocaine and metabolites        300 THC                            50 Performed at [facility], [place] [address] [place], [place] [address]  Current Facility-Administered Medications  Medication Dose Route Frequency Provider Last Rate Last Dose  . acetaminophen (TYLENOL) tablet 650 mg  650 mg Oral Q4H PRN Caryl Ada K, PA-C      . alum & mag hydroxide-simeth (MAALOX/MYLANTA) 200-200-20 MG/5ML suspension 30 mL  30 mL Oral Q6H PRN Caryl Ada K, PA-C      . benztropine (COGENTIN) tablet 1 mg  1 mg Oral BID Patrecia Pour, NP   1 mg at 03/01/18 1008  . hydrOXYzine (ATARAX/VISTARIL) tablet 25 mg  25 mg Oral TID PRN Charlesetta Shanks, MD      . OLANZapine (ZYPREXA) tablet 20 mg  20 mg Oral QHS Patrecia Pour, NP   20 mg at 02/28/18 2237   Current Outpatient Medications  Medication Sig Dispense Refill  . benztropine (COGENTIN) 1 MG tablet Take 1 mg by mouth 2 (two) times daily.    . haloperidol (HALDOL) 10 MG tablet Take 10 mg by mouth daily.    . haloperidol decanoate (HALDOL DECANOATE) 100 MG/ML injection Inject 100 mg into the muscle every 28 (twenty-eight) days.    . hydrOXYzine (VISTARIL) 25 MG capsule Take 25 mg by mouth 3 (three) times daily as needed for anxiety.    Marland Kitchen OLANZapine (ZYPREXA) 20 MG tablet Take 1 tablet (20 mg total) by mouth at bedtime. 30 tablet 0  . venlafaxine (EFFEXOR) 37.5 MG tablet Take 37.5 mg by mouth daily.    Marland Kitchen amoxicillin-clavulanate (AUGMENTIN) 875-125 MG tablet Take 1 tablet by mouth 2 (two) times daily. One tab po bid x 10 days (Patient not taking: Reported on 02/28/2018) 20 tablet 0  . HYDROcodone-acetaminophen (NORCO/VICODIN) 5-325 MG tablet Take 1-2 tablets by mouth every 6 hours as needed for pain and/or cough. (Patient not taking: Reported on 02/28/2018) 13 tablet 0   Facility-Administered Medications Ordered in Other Encounters  Medication Dose Route Frequency Provider Last Rate Last Dose  . magnesium hydroxide (MILK OF MAGNESIA) suspension 30 mL  30 mL Oral Daily PRN Encarnacion Slates, NP        Musculoskeletal: Strength & Muscle Tone: within normal limits Gait & Station: normal Patient leans: N/A  Psychiatric Specialty Exam: Physical Exam  Nursing note and vitals reviewed. Constitutional: He is oriented to person, place, and time. He appears well-developed and well-nourished.  HENT:  Head: Normocephalic.  Neck: Normal range of motion.  Respiratory: Effort normal.  Musculoskeletal: Normal range of motion.  Neurological: He is alert and oriented to person,  place, and time.  Psychiatric: He has a normal mood and affect. His speech is normal and behavior is normal. Judgment and thought content normal. Cognition and memory are normal.    Review of Systems  Psychiatric/Behavioral: Positive for substance abuse.  All other systems reviewed and are negative.   Blood pressure 128/80, pulse 79, temperature 98.7 F (37.1 C), temperature source Oral, resp. rate 16, height 6' (1.829 m), weight 108.4 kg (239 lb), SpO2 98 %.Body mass index is 32.41 kg/m.  General Appearance: Casual  Eye Contact:  Good  Speech:  Normal Rate  Volume:  Normal  Mood:  Irritable  Affect:  Congruent  Thought Process:  Coherent and Descriptions of Associations: Intact  Orientation:  Full (Time, Place, and Person)  Thought Content:  WDL and Logical  Suicidal Thoughts:  No  Homicidal Thoughts:  No  Memory:  Immediate;   Good Recent;   Good Remote;   Good  Judgement:  Fair  Insight:  Fair  Psychomotor Activity:  Normal  Concentration:  Concentration: Good and Attention Span: Good  Recall:  Good  Fund of Knowledge:  Fair  Language:  Good  Akathisia:  No  Handed:  Right  AIMS (if indicated):   N/A  Assets:  Leisure Time Physical Health Resilience Social Support  ADL's:  Intact  Cognition:  WNL  Sleep:   N/A     Treatment Plan Summary: Cocaine abuse with cocaine induced mood disorder: -Restarted Zyprexa 20 mg qhs for mood stabilization -Cogentin 1 mg BID for EPS  Disposition: No evidence of imminent risk to self or others at present.    Waylan Boga, NP 03/01/2018 10:11 AM   Patient seen face-to-face for psychiatric evaluation, chart reviewed and case discussed with the physician extender and developed treatment plan. Reviewed the information documented and agree with the treatment plan.  Buford Dresser, DO 03/07/18 6:21 PM
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
## 2018-04-20 ENCOUNTER — Emergency Department (HOSPITAL_COMMUNITY): Payer: Medicare Other

## 2018-04-20 ENCOUNTER — Encounter (HOSPITAL_COMMUNITY): Payer: Self-pay | Admitting: Emergency Medicine

## 2018-04-20 ENCOUNTER — Emergency Department (HOSPITAL_COMMUNITY)
Admission: EM | Admit: 2018-04-20 | Discharge: 2018-04-20 | Disposition: A | Discharge status: Home / Self Care | Payer: Medicare Other | Attending: Emergency Medicine | Admitting: Emergency Medicine

## 2018-04-20 DIAGNOSIS — R220 Localized swelling, mass and lump, head: Secondary | ICD-10-CM | POA: Diagnosis not present

## 2018-04-20 DIAGNOSIS — S0083XA Contusion of other part of head, initial encounter: Secondary | ICD-10-CM | POA: Diagnosis not present

## 2018-04-20 DIAGNOSIS — R51 Headache: Secondary | ICD-10-CM | POA: Diagnosis not present

## 2018-04-20 DIAGNOSIS — Y939 Activity, unspecified: Secondary | ICD-10-CM | POA: Insufficient documentation

## 2018-04-20 DIAGNOSIS — F17210 Nicotine dependence, cigarettes, uncomplicated: Secondary | ICD-10-CM | POA: Diagnosis not present

## 2018-04-20 DIAGNOSIS — Y929 Unspecified place or not applicable: Secondary | ICD-10-CM | POA: Diagnosis not present

## 2018-04-20 DIAGNOSIS — S0181XA Laceration without foreign body of other part of head, initial encounter: Secondary | ICD-10-CM

## 2018-04-20 DIAGNOSIS — Z79899 Other long term (current) drug therapy: Secondary | ICD-10-CM | POA: Diagnosis not present

## 2018-04-20 DIAGNOSIS — Y998 Other external cause status: Secondary | ICD-10-CM | POA: Insufficient documentation

## 2018-04-20 MED ORDER — ACETAMINOPHEN 325 MG PO TABS
650.0000 mg | ORAL_TABLET | Freq: Once | ORAL | Status: AC
Start: 2018-04-20 — End: 2018-04-20
  Administered 2018-04-20: 650 mg via ORAL
  Filled 2018-04-20: qty 2

## 2018-04-20 MED ORDER — LIDOCAINE-EPINEPHRINE (PF) 2 %-1:200000 IJ SOLN
10.0000 mL | Freq: Once | INTRAMUSCULAR | Status: AC
  Administered 2018-04-20: 10 mL
  Filled 2018-04-20: qty 20
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
## 2018-04-20 NOTE — Discharge Instructions (Addendum)
Please return for any problem.  Follow-up with your regular doctor in 2 to 3 days as instructed.  Your sutures do not need to be removed - they will dissolve on their own.  Please care for your wound as instructed.

## 2018-05-20 ENCOUNTER — Encounter (HOSPITAL_COMMUNITY): Payer: Self-pay | Admitting: *Deleted

## 2018-05-20 ENCOUNTER — Emergency Department (HOSPITAL_COMMUNITY)
Admission: EM | Admit: 2018-05-20 | Discharge: 2018-05-20 | Disposition: A | Discharge status: Home / Self Care | Payer: Medicare Other | Attending: Emergency Medicine | Admitting: Emergency Medicine

## 2018-05-20 DIAGNOSIS — F17210 Nicotine dependence, cigarettes, uncomplicated: Secondary | ICD-10-CM | POA: Insufficient documentation

## 2018-05-20 DIAGNOSIS — Z4802 Encounter for removal of sutures: Secondary | ICD-10-CM | POA: Diagnosis not present

## 2018-05-20 DIAGNOSIS — Z79899 Other long term (current) drug therapy: Secondary | ICD-10-CM | POA: Insufficient documentation

## 2018-05-20 NOTE — ED Provider Notes (Signed)
[facility] [place] EMERGENCY DEPARTMENT Provider Note   CSN: [id number] Arrival date & time: 05/20/18  1309     History   Chief Complaint Chief Complaint  Patient presents with  . Suture / Staple Removal    HPI [name] is a 37 y.o. male who presents to the ED for suture removal.the sutures are in the right eyebrow and patient reports that have been there for one week. Patient denies any problems.  HPI  Past Medical History:  Diagnosis Date  . Crohn's disease ([place])   . GI (gastrointestinal bleed) 04/2013  . Multiple myeloma ([place])   . Schizophrenia ([place])   . Sickle cell anemia (HCC)   . Suicide attempt by drug ingestion ([place])   . Ulcerative colitis     Patient Active Problem List   Diagnosis Date Noted  . Non compliance w medication regimen   . Cocaine use disorder, moderate, dependence ([place]) 06/09/2016  . Cannabis use disorder, severe, dependence ([place]) 06/09/2016  . Cocaine dependence with cocaine-induced mood disorder ([place]) 03/19/2016  . Cocaine-induced mood disorder ([place]) 03/03/2016  . Cocaine abuse ([place]) 03/03/2016  . Suicidal ideation   . Reflux esophagitis 05/24/2013  . Other specified gastritis without mention of hemorrhage 05/24/2013  . Nausea with vomiting 05/23/2013  . Anemia, unspecified 05/23/2013  . Nonspecific abnormal finding in stool contents 05/23/2013  . Paranoid schizophrenia ([place])   . Ulcerative colitis ([place])   . SUBSTANCE ABUSE, MULTIPLE 12/31/2007    Past Surgical History:  Procedure Laterality Date  . ESOPHAGOGASTRODUODENOSCOPY N/A 05/24/2013   Procedure: ESOPHAGOGASTRODUODENOSCOPY (EGD);  Surgeon: Ladene Artist, MD;  Location[facility] ENDOSCOPY;  Service: Endoscopy;  Laterality: N/A;  . FLEXIBLE SIGMOIDOSCOPY  12/05/2011   Procedure: FLEXIBLE SIGMOIDOSCOPY;  Surgeon: Beryle Beams, MD;  Location: [facility] ENDOSCOPY;  Service: Endoscopy;  Laterality: N/A;  . MANDIBLE FRACTURE SURGERY          Home Medications    Prior to  Admission medications   Medication Sig Start Date End Date Taking? Authorizing Provider  benztropine (COGENTIN) 1 MG tablet Take 1 mg by mouth 2 (two) times daily.    [provider]  haloperidol (HALDOL) 10 MG tablet Take 10 mg by mouth daily. 02/26/18   [provider]  haloperidol decanoate (HALDOL DECANOATE) 100 MG/ML injection Inject 100 mg into the muscle every 28 (twenty-eight) days. 02/14/18   [provider]  hydrOXYzine (VISTARIL) 25 MG capsule Take 25 mg by mouth 3 (three) times daily as needed for anxiety.    [provider]  OLANZapine (ZYPREXA) 20 MG tablet Take 1 tablet (20 mg total) by mouth at bedtime. 06/12/16   Kerrie Buffalo, NP  venlafaxine (EFFEXOR) 37.5 MG tablet Take 37.5 mg by mouth daily. 02/14/18   [provider]    Family History Family History  Problem Relation Age of Onset  . Liver cancer Unknown     Social History Social History   Tobacco Use  . Smoking status: Current Some Day Smoker    Packs/day: 0.25    Years: 20.00    Pack years: 5.00    Types: Cigarettes  . Smokeless tobacco: Never Used  Substance Use Topics  . Alcohol use: Yes    Comment: occ  . Drug use: Yes    Types: Cocaine, Marijuana    Comment: quit 1 mo ago     Allergies   Depakote [divalproex sodium]; Lithium; Nsaids; Risperdal [risperidone]; and Nsaids   Review of Systems Review of Systems  Skin: Positive for wound.  All other systems reviewed and are negative.    Physical Exam Updated Vital Signs BP (!) 156/89 (BP Location: Right Arm)   Pulse 98   Temp 97.9 F (36.6 C) (Oral)   Resp 18   SpO2 98%   Physical Exam  Constitutional: He is oriented to person, place, and time. No distress.  Eyes: EOM are normal.  Sutures in place right eyebrow. No signs of infection.  Neck: Neck supple.  Pulmonary/Chest: Effort normal.  Musculoskeletal: Normal range of motion.  Neurological: He is alert and oriented to person, place, and  time. No cranial nerve deficit.  Skin: Skin is warm and dry.  Nursing note and vitals reviewed.    ED Treatments / Results  Labs (all labs ordered are listed, but only abnormal results are displayed) Labs Reviewed - No data to display Radiology No results found.  Procedures .Suture Removal Date/Time: 05/20/2018 2:09 PM Performed by: Ashley Murrain, NP Authorized by: Ashley Murrain, NP   Consent:    Consent obtained:  Verbal   Consent given by:  Patient   Risks discussed:  Wound separation Location:    Location:  Head/neck   Head/neck location:  Eyebrow   Eyebrow location:  L eyebrow Procedure details:    Wound appearance:  No signs of infection   Number of sutures removed:  6 Post-procedure details:    Post-removal:  Antibiotic ointment applied   (including critical care time)  Medications Ordered in ED Medications - No data to display   Initial Impression / Assessment and Plan / ED Course  I have reviewed the triage vital signs and the nursing notes.  Final Clinical Impressions(s) / ED Diagnoses   Final diagnoses:  Visit for suture removal    ED Discharge Orders    None       Debroah Baller [place], [place] 05/20/18 2040    Quintella Reichert, MD 05/21/18 1217

## 2018-05-20 NOTE — ED Triage Notes (Signed)
Pt in needing suture removal, no distress noted

## 2018-09-22 ENCOUNTER — Encounter (HOSPITAL_COMMUNITY): Payer: Self-pay

## 2018-09-22 ENCOUNTER — Emergency Department (HOSPITAL_COMMUNITY)
Admission: EM | Admit: 2018-09-22 | Discharge: 2018-09-23 | Disposition: A | Discharge status: Home / Self Care | Payer: Medicare Other | Attending: Emergency Medicine | Admitting: Emergency Medicine

## 2018-09-22 ENCOUNTER — Emergency Department (HOSPITAL_COMMUNITY): Payer: Medicare Other

## 2018-09-22 ENCOUNTER — Other Ambulatory Visit: Payer: Self-pay

## 2018-09-22 DIAGNOSIS — R10814 Left lower quadrant abdominal tenderness: Secondary | ICD-10-CM | POA: Insufficient documentation

## 2018-09-22 DIAGNOSIS — F259 Schizoaffective disorder, unspecified: Secondary | ICD-10-CM | POA: Insufficient documentation

## 2018-09-22 DIAGNOSIS — Z8719 Personal history of other diseases of the digestive system: Secondary | ICD-10-CM | POA: Diagnosis not present

## 2018-09-22 DIAGNOSIS — R112 Nausea with vomiting, unspecified: Secondary | ICD-10-CM

## 2018-09-22 DIAGNOSIS — D571 Sickle-cell disease without crisis: Secondary | ICD-10-CM | POA: Insufficient documentation

## 2018-09-22 DIAGNOSIS — R1084 Generalized abdominal pain: Secondary | ICD-10-CM | POA: Diagnosis not present

## 2018-09-22 DIAGNOSIS — R197 Diarrhea, unspecified: Secondary | ICD-10-CM | POA: Diagnosis not present

## 2018-09-22 DIAGNOSIS — Z79899 Other long term (current) drug therapy: Secondary | ICD-10-CM | POA: Diagnosis not present

## 2018-09-22 DIAGNOSIS — F17210 Nicotine dependence, cigarettes, uncomplicated: Secondary | ICD-10-CM | POA: Diagnosis not present

## 2018-09-22 LAB — COMPREHENSIVE METABOLIC PANEL
ALT: 18 U/L (ref 0–44)
AST: 37 U/L (ref 15–41)
Albumin: 3.2 g/dL — ABNORMAL LOW (ref 3.5–5.0)
Alkaline Phosphatase: 80 U/L (ref 38–126)
Anion gap: 9 (ref 5–15)
BUN: 11 mg/dL (ref 6–20)
CHLORIDE: 99 mmol/L (ref 98–111)
CO2: 29 mmol/L (ref 22–32)
CREATININE: 1.14 mg/dL (ref 0.61–1.24)
Calcium: 8.5 mg/dL — ABNORMAL LOW (ref 8.9–10.3)
GFR calc Af Amer: >60 mL/min (ref >60)
GFR calc non Af Amer: >60 mL/min (ref >60)
Glucose, Bld: 92 mg/dL (ref 70–99)
POTASSIUM: 5 mmol/L (ref 3.5–5.1)
SODIUM: 137 mmol/L (ref 135–145)
Total Bilirubin: 1.2 mg/dL (ref 0.3–1.2)
Total Protein: 6.7 g/dL (ref 6.5–8.1)

## 2018-09-22 LAB — CBC WITH DIFFERENTIAL/PLATELET
ABS IMMATURE GRANULOCYTES: 0.03 10*3/uL (ref 0.00–0.07)
BASOS ABS: 0.1 10*3/uL (ref 0.0–0.1)
Basophils Relative: 1 %
EOS ABS: 1.2 10*3/uL — AB (ref 0.0–0.5)
Eosinophils Relative: 11 %
HCT: 45.9 % (ref 39.0–52.0)
Hemoglobin: 14.2 g/dL (ref 13.0–17.0)
IMMATURE GRANULOCYTES: 0 %
Lymphocytes Relative: 17 %
Lymphs Abs: 2 10*3/uL (ref 0.7–4.0)
MCH: 26.6 pg (ref 26.0–34.0)
MCHC: 30.9 g/dL (ref 30.0–36.0)
MCV: 86.1 fL (ref 80.0–100.0)
MONOS PCT: 12 %
Monocytes Absolute: 1.4 10*3/uL — ABNORMAL HIGH (ref 0.1–1.0)
NEUTROS ABS: 6.7 10*3/uL (ref 1.7–7.7)
NRBC: 0 % (ref 0.0–0.2)
Neutrophils Relative %: 59 %
Platelets: 330 10*3/uL (ref 150–400)
RBC: 5.33 MIL/uL (ref 4.22–5.81)
RDW: 16.6 % — AB (ref 11.5–15.5)
WBC: 11.3 10*3/uL — AB (ref 4.0–10.5)

## 2018-09-22 LAB — LIPASE, BLOOD: Lipase: 32 U/L (ref 11–51)

## 2018-09-22 MED ORDER — AMOXICILLIN-POT CLAVULANATE 875-125 MG PO TABS: 1.0000 | ORAL_TABLET | Freq: Two times a day (BID) | ORAL | 0 refills | Status: DC

## 2018-09-22 MED ORDER — MORPHINE SULFATE (PF) 4 MG/ML IV SOLN
4.0000 mg | Freq: Once | INTRAVENOUS | Status: AC
  Administered 2018-09-22: 4 mg via INTRAVENOUS
  Filled 2018-09-22: qty 1

## 2018-09-22 MED ORDER — PROMETHAZINE HCL 25 MG/ML IJ SOLN
25.0000 mg | Freq: Once | INTRAMUSCULAR | Status: AC
  Administered 2018-09-22: 25 mg via INTRAVENOUS
  Filled 2018-09-22: qty 1

## 2018-09-22 MED ORDER — ONDANSETRON HCL 4 MG/2ML IJ SOLN
4.0000 mg | Freq: Once | INTRAMUSCULAR | Status: AC
  Administered 2018-09-22: 4 mg via INTRAVENOUS
  Filled 2018-09-22: qty 2

## 2018-09-22 MED ORDER — ONDANSETRON HCL 4 MG/2ML IJ SOLN
4.0000 mg | Freq: Once | INTRAMUSCULAR | Status: AC
  Administered 2018-09-23: 4 mg via INTRAVENOUS
  Filled 2018-09-22: qty 2

## 2018-09-22 MED ORDER — SODIUM CHLORIDE 0.9 % IV BOLUS
1000.0000 mL | Freq: Once | INTRAVENOUS | Status: AC
  Administered 2018-09-22: 1000 mL via INTRAVENOUS

## 2018-09-22 MED ORDER — ONDANSETRON HCL 4 MG PO TABS: 4.0000 mg | ORAL_TABLET | Freq: Four times a day (QID) | ORAL | 0 refills | Status: DC

## 2018-09-22 MED ORDER — HYDROCODONE-ACETAMINOPHEN 5-325 MG PO TABS: 1.0000 | ORAL_TABLET | Freq: Four times a day (QID) | ORAL | 0 refills | Status: DC | PRN

## 2018-09-22 MED ORDER — PREDNISONE 10 MG PO TABS: 20.0000 mg | ORAL_TABLET | Freq: Every day | ORAL | 0 refills | Status: DC

## 2018-09-22 MED ORDER — AMOXICILLIN-POT CLAVULANATE 875-125 MG PO TABS
1.0000 | ORAL_TABLET | Freq: Once | ORAL | Status: AC
  Administered 2018-09-23: 1 via ORAL
  Filled 2018-09-22: qty 1

## 2018-09-22 MED ORDER — PREDNISONE 20 MG PO TABS
60.0000 mg | ORAL_TABLET | Freq: Once | ORAL | Status: AC
  Administered 2018-09-23: 60 mg via ORAL
  Filled 2018-09-22: qty 3

## 2018-09-22 MED ORDER — IOHEXOL 300 MG/ML  SOLN
100.0000 mL | Freq: Once | INTRAMUSCULAR | Status: AC | PRN
  Administered 2018-09-22: 100 mL via INTRAVENOUS

## 2018-09-22 NOTE — ED Triage Notes (Signed)
Pt from home w/ a c/o crohn's flare up. Pt reports he has had N/V/D for the past three days. He has vomited ~ 14 times and had ~ 21 episodes of diarrhea. No blood noted in his vomit but he reports that his stool was a red/brown color. He has been unable to eat anything without vomiting it up. He has not taken anything to relieve his symptoms. Current complaints of nausea. Complaints of lower abdominal pain that is tender to palpation. Pt reports distention.

## 2018-09-22 NOTE — ED Provider Notes (Signed)
[place] EMERGENCY DEPARTMENT Provider Note   CSN: [id number] Arrival date & time: 09/22/18  1828     History   Chief Complaint Chief Complaint  Patient presents with  . Crohn's Disease    HPI [name] is a 37 y.o. male with a history of Crohn's, ulcerative colitis, schizophrenia who presents emergency department today for abdominal pain, nausea, vomiting and diarrhea.  Patient reports for the last 3 days he has had generalized abdominal pain that he describes as cramping, worse in the left lower quadrant that is associated with constant nausea, as well as nonbilious, nonbloody emesis and diarrhea.  He reports since onset he has had approximately 14 episodes of emesis with the last approximately 1-2 hours ago.  Patient also reports approximately 21 episodes of diarrhea.  He notes this was initially brown/bloody but has since become watery and nonbloody/non-melanous.  The patient has not been able to take anything for his symptoms.  He notes he is not on any medications for his ulcerative colitis or Crohn's disease.  Patient has not followed regularly with GI.  He states this feels similar to his previous flares of IBD.  He denies any fever, chills, flank pain, urinary symptoms.  No other complaints at this time.  HPI  Past Medical History:  Diagnosis Date  . Crohn's disease ([place])   . GI (gastrointestinal bleed) 04/2013  . Multiple myeloma ([place])   . Schizophrenia ([place])   . Sickle cell anemia (HCC)   . Suicide attempt by drug ingestion ([place])   . Ulcerative colitis     Patient Active Problem List   Diagnosis Date Noted  . Non compliance w medication regimen   . Cocaine use disorder, moderate, dependence ([place]) 06/09/2016  . Cannabis use disorder, severe, dependence ([place]) 06/09/2016  . Cocaine dependence with cocaine-induced mood disorder ([place]) 03/19/2016  . Cocaine-induced mood disorder ([place]) 03/03/2016  . Cocaine abuse ([place]) 03/03/2016  . Suicidal  ideation   . Reflux esophagitis 05/24/2013  . Other specified gastritis without mention of hemorrhage 05/24/2013  . Nausea with vomiting 05/23/2013  . Anemia, unspecified 05/23/2013  . Nonspecific abnormal finding in stool contents 05/23/2013  . Paranoid schizophrenia ([place])   . Ulcerative colitis ([place])   . SUBSTANCE ABUSE, MULTIPLE 12/31/2007    Past Surgical History:  Procedure Laterality Date  . ESOPHAGOGASTRODUODENOSCOPY N/A 05/24/2013   Procedure: ESOPHAGOGASTRODUODENOSCOPY (EGD);  Surgeon: Ladene Artist, MD;  Location[facility] ENDOSCOPY;  Service: Endoscopy;  Laterality: N/A;  . FLEXIBLE SIGMOIDOSCOPY  12/05/2011   Procedure: FLEXIBLE SIGMOIDOSCOPY;  Surgeon: Beryle Beams, MD;  Location: [facility] ENDOSCOPY;  Service: Endoscopy;  Laterality: N/A;  . MANDIBLE FRACTURE SURGERY          Home Medications    Prior to Admission medications   Medication Sig Start Date End Date Taking? Authorizing Provider  benztropine (COGENTIN) 1 MG tablet Take 1 mg by mouth 2 (two) times daily.    [provider]  haloperidol (HALDOL) 10 MG tablet Take 10 mg by mouth daily. 02/26/18   [provider]  haloperidol decanoate (HALDOL DECANOATE) 100 MG/ML injection Inject 100 mg into the muscle every 28 (twenty-eight) days. 02/14/18   [provider]  hydrOXYzine (VISTARIL) 25 MG capsule Take 25 mg by mouth 3 (three) times daily as needed for anxiety.    [provider]  OLANZapine (ZYPREXA) 20 MG tablet Take 1 tablet (20 mg total) by mouth at bedtime. 06/12/16   Kerrie Buffalo, NP  venlafaxine (  EFFEXOR) 37.5 MG tablet Take 37.5 mg by mouth daily. 02/14/18   [provider]    Family History Family History  Problem Relation Age of Onset  . Liver cancer Unknown     Social History Social History   Tobacco Use  . Smoking status: Current Some Day Smoker    Packs/day: 0.25    Years: 20.00    Pack years: 5.00    Types: Cigarettes  . Smokeless tobacco: Never Used    Substance Use Topics  . Alcohol use: Yes    Comment: occ  . Drug use: Yes    Types: Cocaine, Marijuana    Comment: quit 1 mo ago     Allergies   Depakote [divalproex sodium]; Lithium; Nsaids; Risperdal [risperidone]; and Nsaids   Review of Systems Review of Systems  All other systems reviewed and are negative.    Physical Exam Updated Vital Signs BP (!) 152/89   Pulse 76   Temp 97.8 F (36.6 C) (Oral)   Resp 18   Ht 6' (1.829 m)   Wt 106.6 kg   SpO2 94%   BMI 31.87 kg/m   Physical Exam  Constitutional: He appears well-developed and well-nourished.  HENT:  Head: Normocephalic and atraumatic.  Right Ear: External ear normal.  Left Ear: External ear normal.  Nose: Nose normal.  Mouth/Throat: Uvula is midline, oropharynx is clear and moist and mucous membranes are normal. No tonsillar exudate.  Eyes: Pupils are equal, round, and reactive to light. Right eye exhibits no discharge. Left eye exhibits no discharge. No scleral icterus.  Neck: Trachea normal. Neck supple. No spinous process tenderness present. No neck rigidity. Normal range of motion present.  Cardiovascular: Normal rate, regular rhythm and intact distal pulses.  No murmur heard. Pulses:      Radial pulses are 2+ on the right side, and 2+ on the left side.       Dorsalis pedis pulses are 2+ on the right side, and 2+ on the left side.       Posterior tibial pulses are 2+ on the right side, and 2+ on the left side.  No lower extremity swelling or edema. Calves symmetric in size bilaterally.  Pulmonary/Chest: Effort normal and breath sounds normal. He exhibits no tenderness.  Abdominal: Soft. Bowel sounds are normal. He exhibits no distension. There is generalized tenderness and tenderness in the left lower quadrant. There is no rigidity, no rebound, no guarding, no CVA tenderness, no tenderness at McBurney's point and negative Murphy's sign.  Musculoskeletal: He exhibits no edema.  Lymphadenopathy:    He  has no cervical adenopathy.  Neurological: He is alert.  Skin: Skin is warm and dry. No rash noted. He is not diaphoretic.  Psychiatric: He has a normal mood and affect.  Nursing note and vitals reviewed.    ED Treatments / Results  Labs (all labs ordered are listed, but only abnormal results are displayed) Labs Reviewed  CBC WITH DIFFERENTIAL/PLATELET - Abnormal; Notable for the following components:      Result Value   WBC 11.3 (*)    RDW 16.6 (*)    Monocytes Absolute 1.4 (*)    Eosinophils Absolute 1.2 (*)    All other components within normal limits  COMPREHENSIVE METABOLIC PANEL - Abnormal; Notable for the following components:   Calcium 8.5 (*)    Albumin 3.2 (*)    All other components within normal limits  LIPASE, BLOOD  URINALYSIS, ROUTINE W REFLEX MICROSCOPIC    EKG  None  Radiology No results found.  Procedures Procedures (including critical care time)  Medications Ordered in ED Medications  morphine 4 MG/ML injection 4 mg (has no administration in time range)  morphine 4 MG/ML injection 4 mg (4 mg Intravenous Given 09/22/18 2046)  sodium chloride 0.9 % bolus 1,000 mL (1,000 mLs Intravenous New Bag/Given 09/22/18 2046)  ondansetron (ZOFRAN) injection 4 mg (4 mg Intravenous Given 09/22/18 2046)  promethazine (PHENERGAN) injection 25 mg (25 mg Intravenous Given 09/22/18 2108)     Initial Impression / Assessment and Plan / ED Course  I have reviewed the triage vital signs and the nursing notes.  Pertinent labs & imaging results that were available during my care of the patient were reviewed by me and considered in my medical decision making (see chart for details).     37 y.o. male with hx of IBD (UC and Crohn's) who presents emergency department today for abdominal pain that is worse in the left lower quadrant, nausea, vomiting and diarrhea.  Patient's vital signs are reassuring on presentation.  He is without fever, tachycardia, tachypnea, hypoxia or  hypotension.  Abdominal exam is with generalized tenderness without peritoneal signs.  His tenderness is worse in the left lower quadrant.  Lab work done in triage is with a leukocytosis of 11,300 and is otherwise reassuring.  UA is pending.  Will plan for CT of the abdomen and pelvis with contrast to evaluate patient's symptoms.  Patient given IV fluids, pain medication and antiemetics.  With CT pending, case signed out to Dr. Francia Greaves.   Final Clinical Impressions(s) / ED Diagnoses   Final diagnoses:  History of Crohn's disease  Nausea vomiting and diarrhea    ED Discharge Orders    None       Lorelle Gibbs 09/22/18 2226    Valarie Merino, MD 09/23/18 [phone]

## 2018-09-22 NOTE — Discharge Instructions (Addendum)
Return for any problem.  Follow-up with your regular care provider as instructed.  Please follow-up with gastroenterology as instructed.

## 2018-09-23 DIAGNOSIS — R112 Nausea with vomiting, unspecified: Secondary | ICD-10-CM | POA: Diagnosis not present

## 2018-10-29 ENCOUNTER — Encounter (HOSPITAL_COMMUNITY): Payer: Self-pay | Admitting: Emergency Medicine

## 2018-10-29 ENCOUNTER — Emergency Department (HOSPITAL_COMMUNITY)
Admission: EM | Admit: 2018-10-29 | Discharge: 2018-10-29 | Disposition: A | Discharge status: Home / Self Care | Payer: Medicare Other | Attending: Emergency Medicine | Admitting: Emergency Medicine

## 2018-10-29 DIAGNOSIS — R109 Unspecified abdominal pain: Secondary | ICD-10-CM | POA: Diagnosis not present

## 2018-10-29 DIAGNOSIS — F17210 Nicotine dependence, cigarettes, uncomplicated: Secondary | ICD-10-CM | POA: Insufficient documentation

## 2018-10-29 DIAGNOSIS — Z79899 Other long term (current) drug therapy: Secondary | ICD-10-CM | POA: Diagnosis not present

## 2018-10-29 LAB — CBC
HCT: 48.7 % (ref 39.0–52.0)
Hemoglobin: 15.2 g/dL (ref 13.0–17.0)
MCH: 27 pg (ref 26.0–34.0)
MCHC: 31.2 g/dL (ref 30.0–36.0)
MCV: 86.3 fL (ref 80.0–100.0)
PLATELETS: 393 10*3/uL (ref 150–400)
RBC: 5.64 MIL/uL (ref 4.22–5.81)
RDW: 15.8 % — ABNORMAL HIGH (ref 11.5–15.5)
WBC: 10.3 10*3/uL (ref 4.0–10.5)
nRBC: 0 % (ref 0.0–0.2)

## 2018-10-29 LAB — COMPREHENSIVE METABOLIC PANEL
ALT: 18 U/L (ref 0–44)
AST: 25 U/L (ref 15–41)
Albumin: 3.8 g/dL (ref 3.5–5.0)
Alkaline Phosphatase: 87 U/L (ref 38–126)
Anion gap: 10 (ref 5–15)
BUN: 9 mg/dL (ref 6–20)
CO2: 25 mmol/L (ref 22–32)
Calcium: 9.3 mg/dL (ref 8.9–10.3)
Chloride: 105 mmol/L (ref 98–111)
Creatinine, Ser: 1.12 mg/dL (ref 0.61–1.24)
GFR calc non Af Amer: >60 mL/min (ref >60)
Glucose, Bld: 100 mg/dL — ABNORMAL HIGH (ref 70–99)
Potassium: 4.1 mmol/L (ref 3.5–5.1)
Sodium: 140 mmol/L (ref 135–145)
Total Bilirubin: 0.7 mg/dL (ref 0.3–1.2)
Total Protein: 7.9 g/dL (ref 6.5–8.1)

## 2018-10-29 LAB — URINALYSIS, ROUTINE W REFLEX MICROSCOPIC
Bilirubin Urine: NEGATIVE
Glucose, UA: NEGATIVE mg/dL
Hgb urine dipstick: NEGATIVE
Ketones, ur: NEGATIVE mg/dL
Nitrite: NEGATIVE
Protein, ur: NEGATIVE mg/dL
Specific Gravity, Urine: 1.019 (ref 1.005–1.030)
pH: 6 (ref 5.0–8.0)

## 2018-10-29 LAB — LIPASE, BLOOD: LIPASE: 28 U/L (ref 11–51)

## 2018-10-29 MED ORDER — CIPROFLOXACIN HCL 500 MG PO TABS
500.0000 mg | ORAL_TABLET | Freq: Once | ORAL | Status: AC
  Administered 2018-10-29: 500 mg via ORAL
  Filled 2018-10-29: qty 1

## 2018-10-29 MED ORDER — ONDANSETRON HCL 4 MG/2ML IJ SOLN
4.0000 mg | Freq: Once | INTRAMUSCULAR | Status: AC
  Administered 2018-10-29: 4 mg via INTRAVENOUS
  Filled 2018-10-29: qty 2

## 2018-10-29 MED ORDER — SODIUM CHLORIDE 0.9 % IV BOLUS
1000.0000 mL | Freq: Once | INTRAVENOUS | Status: AC
  Administered 2018-10-29: 1000 mL via INTRAVENOUS

## 2018-10-29 MED ORDER — PREDNISONE 10 MG (21) PO TBPK: ORAL_TABLET | Freq: Every day | ORAL | 0 refills | Status: DC

## 2018-10-29 MED ORDER — METRONIDAZOLE 500 MG PO TABS
500.0000 mg | ORAL_TABLET | Freq: Once | ORAL | Status: AC
  Administered 2018-10-29: 500 mg via ORAL
  Filled 2018-10-29: qty 1

## 2018-10-29 MED ORDER — PREDNISONE 20 MG PO TABS
60.0000 mg | ORAL_TABLET | Freq: Once | ORAL | Status: AC
  Administered 2018-10-29: 60 mg via ORAL
  Filled 2018-10-29: qty 3

## 2018-10-29 MED ORDER — ONDANSETRON HCL 4 MG PO TABS: 4.0000 mg | ORAL_TABLET | Freq: Four times a day (QID) | ORAL | 0 refills | Status: DC

## 2018-10-29 MED ORDER — AMOXICILLIN-POT CLAVULANATE 875-125 MG PO TABS: 1.0000 | ORAL_TABLET | Freq: Two times a day (BID) | ORAL | 0 refills | Status: AC

## 2018-10-29 MED ORDER — OXYCODONE-ACETAMINOPHEN 5-325 MG PO TABS
1.0000 | ORAL_TABLET | Freq: Once | ORAL | Status: AC
  Administered 2018-10-29: 1 via ORAL
  Filled 2018-10-29: qty 1

## 2018-10-29 NOTE — ED Provider Notes (Signed)
[facility] EMERGENCY DEPARTMENT Provider Note   CSN: [id number] Arrival date & time: 10/29/18  1126     History   Chief Complaint Chief Complaint  Patient presents with  . Abdominal Pain  . Emesis    HPI [name] is a 38 y.o. male.  HPI  Pt is a 38 y/o male with a h/o crohn's and UC C/o diffuse abd pain that began 2 days ago. States pain is 10/10. Pain feels like an aching pain. Pain is intermittent. Pain is worse before BMs.  Pt also reports nausea and vomiting (x2/ day). Vomit is nonbloody and nonbilious. Denies diarrhea but reports bloody stools with bright red blood. Denies fevers or chills. Denies urinary sxs.   Past Medical History:  Diagnosis Date  . Crohn's disease ([place])   . GI (gastrointestinal bleed) 04/2013  . Multiple myeloma ([place])   . Schizophrenia ([place])   . Sickle cell anemia (HCC)   . Suicide attempt by drug ingestion ([place])   . Ulcerative colitis     Patient Active Problem List   Diagnosis Date Noted  . Non compliance w medication regimen   . Cocaine use disorder, moderate, dependence ([place]) 06/09/2016  . Cannabis use disorder, severe, dependence ([place]) 06/09/2016  . Cocaine dependence with cocaine-induced mood disorder ([place]) 03/19/2016  . 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 Cocaine abuse ([place]) 03/03/2016  . Suicidal ideation   . Reflux esophagitis 05/24/2013  . Other specified gastritis without mention of hemorrhage 05/24/2013  . Nausea with vomiting 05/23/2013  . Anemia, unspecified 05/23/2013  . Nonspecific abnormal finding in stool contents 05/23/2013  . Paranoid schizophrenia ([place])   . Ulcerative colitis ([place])   . SUBSTANCE ABUSE, MULTIPLE 12/31/2007    Past Surgical History:  Procedure Laterality Date  . ESOPHAGOGASTRODUODENOSCOPY N/A 05/24/2013   Procedure: ESOPHAGOGASTRODUODENOSCOPY (EGD);  Surgeon: Ladene Artist, MD;  Location: [facility] ENDOSCOPY;  Service: Endoscopy;  Laterality: N/A;  . FLEXIBLE  SIGMOIDOSCOPY  12/05/2011   Procedure: FLEXIBLE SIGMOIDOSCOPY;  Surgeon: Beryle Beams, MD;  Location: [facility] ENDOSCOPY;  Service: Endoscopy;  Laterality: N/A;  . MANDIBLE FRACTURE SURGERY          Home Medications    Prior to Admission medications   Medication Sig Start Date End Date Taking? Authorizing Provider  amoxicillin-clavulanate (AUGMENTIN) 875-125 MG tablet Take 1 tablet by mouth every 12 (twelve) hours for 7 days. 10/29/18 11/05/18  Cloie Wooden S, PA-C  benztropine (COGENTIN) 1 MG tablet Take 1 mg by mouth at bedtime.     [provider]  haloperidol (HALDOL) 10 MG tablet Take 10 mg by mouth at bedtime.  02/26/18   [provider]  haloperidol decanoate (HALDOL DECANOATE) 100 MG/ML injection Inject 100 mg into the muscle every 28 (twenty-eight) days. 02/14/18   [provider]  HYDROcodone-acetaminophen (NORCO/VICODIN) 5-325 MG tablet Take 1-2 tablets by mouth every 6 (six) hours as needed. 09/22/18   Valarie Merino, MD  OLANZapine (ZYPREXA) 20 MG tablet Take 1 tablet (20 mg total) by mouth at bedtime. Patient not taking: Reported on 09/22/2018 06/12/16   Kerrie Buffalo, NP  ondansetron (ZOFRAN) 4 MG tablet Take 1 tablet (4 mg total) by mouth every 6 (six) hours. 10/29/18   Javier Mamone S, PA-C  predniSONE (STERAPRED UNI-PAK 21 TAB) 10 MG (21) TBPK tablet Take by mouth daily. Take 6 tabs by mouth daily  for 2 days, then 5 tabs for 2 days, then 4 tabs for 2 days, then 3  tabs for 2 days, 2 tabs for 2 days, then 1 tab by mouth daily for 2 days 10/29/18   Emiya Loomer S, PA-C    Family History Family History  Problem Relation Age of Onset  . Liver cancer Other     Social History Social History   Tobacco Use  . Smoking status: Current Some Day Smoker    Packs/day: 0.25    Years: 20.00    Pack years: 5.00    Types: Cigarettes  . Smokeless tobacco: Never Used  Substance Use Topics  . Alcohol use: Yes    Comment: occ  . Drug use: Yes    Types:  Cocaine, Marijuana    Comment: quit 1 mo ago     Allergies   Depakote [divalproex sodium]; Lithium; Nsaids; and Risperdal [risperidone]   Review of Systems Review of Systems  Constitutional: Negative for chills and fever.  HENT: Negative for ear pain and sore throat.   Eyes: Negative for pain and visual disturbance.  Respiratory: Negative for cough and shortness of breath.   Cardiovascular: Negative for chest pain.  Gastrointestinal: Positive for abdominal pain, blood in stool, diarrhea, nausea and vomiting. Negative for constipation.  Genitourinary: Negative for dysuria, frequency and hematuria.  Musculoskeletal: Negative for back pain.  Skin: Negative for rash.  Neurological: Negative for seizures and syncope.  All other systems reviewed and are negative.    Physical Exam Updated Vital Signs BP (!) 121/97 (BP Location: Left Arm)   Pulse 75   Temp 98.2 F (36.8 C) (Oral)   Resp 17   Ht 6' (1.829 m)   Wt 105.7 kg   SpO2 99%   BMI 31.60 kg/m   Physical Exam Vitals signs and nursing note reviewed.  Constitutional:      Appearance: He is well-developed.  HENT:     Head: Normocephalic and atraumatic.     Mouth/Throat:     Comments: Dry mucous membranes Eyes:     Conjunctiva/sclera: Conjunctivae normal.  Neck:     Musculoskeletal: Neck supple.  Cardiovascular:     Rate and Rhythm: Normal rate and regular rhythm.     Heart sounds: No murmur.  Pulmonary:     Effort: Pulmonary effort is normal. No respiratory distress.     Breath sounds: Normal breath sounds.  Abdominal:     General: Bowel sounds are normal.     Palpations: Abdomen is soft.     Tenderness: There is abdominal tenderness (diffuse, worse to LLQ). There is no right CVA tenderness, left CVA tenderness, guarding or rebound.  Skin:    General: Skin is warm and dry.  Neurological:     Mental Status: He is alert.      ED Treatments / Results  Labs (all labs ordered are listed, but only abnormal  results are displayed) Labs Reviewed  COMPREHENSIVE METABOLIC PANEL - Abnormal; Notable for the following components:      Result Value   Glucose, Bld 100 (*)    All other components within normal limits  CBC - Abnormal; Notable for the following components:   RDW 15.8 (*)    All other components within normal limits  URINALYSIS, ROUTINE W REFLEX MICROSCOPIC - Abnormal; Notable for the following components:   Leukocytes, UA SMALL (*)    Bacteria, UA RARE (*)    All other components within normal limits  LIPASE, BLOOD    EKG None  Radiology No results found.  Procedures Procedures (including critical care time)  Medications Ordered  in ED Medications  ondansetron (ZOFRAN) injection 4 mg (4 mg Intravenous Given 10/29/18 1250)  predniSONE (DELTASONE) tablet 60 mg (60 mg Oral Given 10/29/18 1248)  sodium chloride 0.9 % bolus 1,000 mL (1,000 mLs Intravenous New Bag/Given 10/29/18 1303)  ciprofloxacin (CIPRO) tablet 500 mg (500 mg Oral Given 10/29/18 1247)  metroNIDAZOLE (FLAGYL) tablet 500 mg (500 mg Oral Given 10/29/18 1247)  oxyCODONE-acetaminophen (PERCOCET/ROXICET) 5-325 MG per tablet 1 tablet (1 tablet Oral Given 10/29/18 1314)     Initial Impression / Assessment and Plan / ED Course  I have reviewed the triage vital signs and the nursing notes.  Pertinent labs & imaging results that were available during my care of the patient were reviewed by me and considered in my medical decision making (see chart for details).   Final Clinical Impressions(s) / ED Diagnoses   Final diagnoses:  Abdominal pain, unspecified abdominal location   Patient presenting with abdominal pain, nausea, vomiting and body stools for the last 2 days.  Is afebrile here with normal vitals.  On initial exam patient has diffuse abdominal tenderness however abdomen is soft and there is no guarding, rebound or rigidity.  Lab work is reassuring with normal CBC.  No leukocytosis or anemia.  Electrolytes are  within normal limits.  Liver and kidney function are normal.  Lipase is normal.  UA with small leukocytes, 0-5 RBCs, 6-10 white blood cells and rare bacteria.  Patient is asymptomatic therefore doubt urinary tract infection as cause of symptoms.  Suspect this may be related to his underlying ulcerative colitis and Crohn's.  He received antibiotics, steroids and antiemetics and pain medicines in the ED.  He also received IV fluids.  He is able to tolerate p.o.  On reevaluation he states his symptoms feel improved.  States his symptoms do not feel as severe as past flares that have occurred.  He feels improved enough to be discharged home.  Repeat abdominal exam is benign.  Will be discharged with Rx for Augmentin, prednisone and antiemetics.  Have advised him to follow-up with GI and PCP.  Advised return if worse.  He voiced understanding the plan reasons return the ED.  All questions answered.  ED Discharge Orders         Ordered    amoxicillin-clavulanate (AUGMENTIN) 875-125 MG tablet  Every 12 hours     10/29/18 1352    ondansetron (ZOFRAN) 4 MG tablet  Every 6 hours     10/29/18 1352    predniSONE (STERAPRED UNI-PAK 21 TAB) 10 MG (21) TBPK tablet  Daily     10/29/18 1352           Rodney Booze, PA-C 10/29/18 1355    Valarie Merino, MD 10/29/18 1823

## 2018-10-29 NOTE — ED Notes (Signed)
Patient verbalized understanding of discharge instructions and denies any further needs or questions at this time. VS stable. Patient ambulatory with steady gait. Provided bus pass.

## 2018-10-29 NOTE — ED Triage Notes (Signed)
States  For 2 days has had abd pain vomiting and blood in stools has hx of chrons

## 2018-10-29 NOTE — Discharge Instructions (Addendum)
You were given a prescription for antibiotics. Please take the antibiotic prescription fully.   Please take prednisone as advised on your discharge paperwork.  You may also take Zofran as needed for nausea and vomiting.  You are given information to follow-up with the gastroenterology doctor.  Please call the office to make an appointment for follow-up.  You were also given information to follow-up with the [facility], please call the office to make an appointment for follow-up and to establish primary care.  Please return to the ER sooner if you have any new or worsening symptoms, or if you have any of the following symptoms:  Abdominal pain that does not go away.  You have a fever.  You keep throwing up (vomiting).  The pain is felt only in portions of the abdomen. Pain in the right side could possibly be appendicitis. In an adult, pain in the left lower portion of the abdomen could be colitis or diverticulitis.  You pass bloody or black tarry stools.  There is bright red blood in the stool.  The constipation stays for more than 4 days.  There is belly (abdominal) or rectal pain.  You do not seem to be getting better.  You have any questions or concerns.

## 2018-12-09 ENCOUNTER — Emergency Department (HOSPITAL_COMMUNITY): Payer: Medicare Other

## 2018-12-09 ENCOUNTER — Emergency Department (HOSPITAL_COMMUNITY)
Admission: EM | Admit: 2018-12-09 | Discharge: 2018-12-09 | Disposition: A | Discharge status: Home / Self Care | Payer: Medicare Other | Attending: Emergency Medicine | Admitting: Emergency Medicine

## 2018-12-09 ENCOUNTER — Encounter (HOSPITAL_COMMUNITY): Payer: Self-pay

## 2018-12-09 DIAGNOSIS — F17210 Nicotine dependence, cigarettes, uncomplicated: Secondary | ICD-10-CM | POA: Insufficient documentation

## 2018-12-09 DIAGNOSIS — J069 Acute upper respiratory infection, unspecified: Secondary | ICD-10-CM | POA: Insufficient documentation

## 2018-12-09 DIAGNOSIS — Z79899 Other long term (current) drug therapy: Secondary | ICD-10-CM | POA: Insufficient documentation

## 2018-12-09 DIAGNOSIS — R05 Cough: Secondary | ICD-10-CM

## 2018-12-09 DIAGNOSIS — B9789 Other viral agents as the cause of diseases classified elsewhere: Secondary | ICD-10-CM | POA: Diagnosis not present

## 2018-12-09 DIAGNOSIS — R059 Cough, unspecified: Secondary | ICD-10-CM

## 2018-12-09 MED ORDER — ACETAMINOPHEN 325 MG PO TABS
650.0000 mg | ORAL_TABLET | Freq: Once | ORAL | Status: AC
  Administered 2018-12-09: 650 mg via ORAL
  Filled 2018-12-09: qty 2

## 2018-12-09 MED ORDER — DEXAMETHASONE SODIUM PHOSPHATE 10 MG/ML IJ SOLN
10.0000 mg | Freq: Once | INTRAMUSCULAR | Status: AC
  Administered 2018-12-09: 10 mg via INTRAMUSCULAR
  Filled 2018-12-09: qty 1

## 2018-12-09 NOTE — ED Notes (Signed)
Patient transported to X-ray 

## 2018-12-09 NOTE — ED Provider Notes (Signed)
[facility] EMERGENCY DEPARTMENT Provider Note   CSN: [id number] Arrival date & time: 12/09/18  6222    History   Chief Complaint Chief Complaint  Patient presents with  . URI    HPI [name] is a 38 y.o. male.     The history is provided by the patient and medical records. No language interpreter was used.   [name] is a 38 y.o. male  with a PMH of sickle cell, crohn's who presents to the Emergency Department complaining of persistent productive cough, nasal congestion x4 days.  He has taken over-the-counter cough medication and cold medication with little improvement.  He reports having to breathe through his mouth because his nose is so stopped up, but no shortness of breath.  No chest pain.  Denies any known fevers.  No sick contacts.  No abdominal pain, nausea or vomiting.   Past Medical History:  Diagnosis Date  . Crohn's disease ([place])   . GI (gastrointestinal bleed) 04/2013  . Multiple myeloma ([place])   . Schizophrenia ([place])   . Sickle cell anemia (HCC)   . Suicide attempt by drug ingestion ([place])   . Ulcerative colitis     Patient Active Problem List   Diagnosis Date Noted  . Non compliance w medication regimen   . Cocaine use disorder, moderate, dependence ([place]) 06/09/2016  . Cannabis use disorder, severe, dependence ([place]) 06/09/2016  . Cocaine dependence with cocaine-induced mood disorder ([place]) 03/19/2016  . Cocaine-induced mood disorder ([place]) 03/03/2016  . Cocaine abuse ([place]) 03/03/2016  . Suicidal ideation   . Reflux esophagitis 05/24/2013  . Other specified gastritis without mention of hemorrhage 05/24/2013  . Nausea with vomiting 05/23/2013  . Anemia, unspecified 05/23/2013  . Nonspecific abnormal finding in stool contents 05/23/2013  . Paranoid schizophrenia ([place])   . Ulcerative colitis ([place])   . SUBSTANCE ABUSE, MULTIPLE 12/31/2007    Past Surgical History:  Procedure Laterality Date  . ESOPHAGOGASTRODUODENOSCOPY N/A  05/24/2013   Procedure: ESOPHAGOGASTRODUODENOSCOPY (EGD);  Surgeon: Ladene Artist, MD;  Location: [facility] ENDOSCOPY;  Service: Endoscopy;  Laterality: N/A;  . FLEXIBLE SIGMOIDOSCOPY  12/05/2011   Procedure: FLEXIBLE SIGMOIDOSCOPY;  Surgeon: Beryle Beams, MD;  Location: [facility] ENDOSCOPY;  Service: Endoscopy;  Laterality: N/A;  . MANDIBLE FRACTURE SURGERY          Home Medications    Prior to Admission medications   Medication Sig Start Date End Date Taking? Authorizing Provider  benztropine (COGENTIN) 1 MG tablet Take 1 mg by mouth at bedtime.     [provider]  haloperidol (HALDOL) 10 MG tablet Take 10 mg by mouth at bedtime.  02/26/18   [provider]  haloperidol decanoate (HALDOL DECANOATE) 100 MG/ML injection Inject 100 mg into the muscle every 28 (twenty-eight) days. 02/14/18   [provider]  HYDROcodone-acetaminophen (NORCO/VICODIN) 5-325 MG tablet Take 1-2 tablets by mouth every 6 (six) hours as needed. 09/22/18   Valarie Merino, MD  OLANZapine (ZYPREXA) 20 MG tablet Take 1 tablet (20 mg total) by mouth at bedtime. Patient not taking: Reported on 09/22/2018 06/12/16   Kerrie Buffalo, NP  ondansetron (ZOFRAN) 4 MG tablet Take 1 tablet (4 mg total) by mouth every 6 (six) hours. 10/29/18   Couture, Cortni S, PA-C  predniSONE (STERAPRED UNI-PAK 21 TAB) 10 MG (21) TBPK tablet Take by mouth daily. Take 6 tabs by mouth daily  for 2 days, then 5 tabs for 2 days, then 4 tabs for 2  days, then 3 tabs for 2 days, 2 tabs for 2 days, then 1 tab by mouth daily for 2 days 10/29/18   Couture, Cortni S, PA-C    Family History Family History  Problem Relation Age of Onset  . Liver cancer Other     Social History Social History   Tobacco Use  . Smoking status: Current Some Day Smoker    Packs/day: 0.25    Years: 20.00    Pack years: 5.00    Types: Cigarettes  . Smokeless tobacco: Never Used  Substance Use Topics  . Alcohol use: Yes    Comment: occ  . Drug use: Yes     Types: Cocaine, Marijuana    Comment: quit 1 mo ago     Allergies   Depakote [divalproex sodium]; Lithium; Nsaids; and Risperdal [risperidone]   Review of Systems Review of Systems  HENT: Positive for congestion. Negative for sore throat.   Respiratory: Positive for cough. Negative for shortness of breath.   Cardiovascular: Negative for chest pain.  Gastrointestinal: Negative for abdominal pain, diarrhea, nausea and vomiting.  Musculoskeletal: Positive for myalgias. Negative for back pain and neck pain.  Skin: Negative for rash.  Neurological: Negative for weakness and headaches.     Physical Exam Updated Vital Signs BP 121/90 (BP Location: Left Arm)   Pulse (!) 102   Temp 99 F (37.2 C) (Oral)   Resp 18   SpO2 98%   Physical Exam Vitals signs and nursing note reviewed.  Constitutional:      General: He is not in acute distress.    Appearance: He is well-developed.  HENT:     Head: Normocephalic and atraumatic.     Nose: Congestion present.     Mouth/Throat:     Mouth: Mucous membranes are moist.     Pharynx: No oropharyngeal exudate.  Neck:     Musculoskeletal: Neck supple.  Cardiovascular:     Heart sounds: Normal heart sounds. No murmur.     Comments: Mildly tachycardic on exam, but regular. Pulmonary:     Effort: Pulmonary effort is normal. No respiratory distress.     Breath sounds: Normal breath sounds. No wheezing or rales.  Musculoskeletal: Normal range of motion.  Skin:    General: Skin is warm and dry.  Neurological:     Mental Status: He is alert.      ED Treatments / Results  Labs (all labs ordered are listed, but only abnormal results are displayed) Labs Reviewed - No data to display  EKG None  Radiology Dg Chest 2 View  Result Date: 12/09/2018 CLINICAL DATA:  Cough and runny nose for several days EXAM: CHEST - 2 VIEW COMPARISON:  06/23/2015 FINDINGS: The heart size and mediastinal contours are within normal limits. Both lungs are  clear. The visualized skeletal structures are unremarkable. IMPRESSION: No active cardiopulmonary disease. Electronically Signed   By: Inez Catalina M.D.   On: 12/09/2018 09:39    Procedures Procedures (including critical care time)  Medications Ordered in ED Medications  dexamethasone (DECADRON) injection 10 mg (10 mg Intramuscular Given 12/09/18 0910)  acetaminophen (TYLENOL) tablet 650 mg (650 mg Oral Given 12/09/18 0913)     Initial Impression / Assessment and Plan / ED Course  I have reviewed the triage vital signs and the nursing notes.  Pertinent labs & imaging results that were available during my care of the patient were reviewed by me and considered in my medical decision making (see chart for details).  [name] is a 38 y.o. male who presents to ED for nasal congestion and cough.  Denies chest pain or shortness of breath.   On exam, patient is afebrile, non-toxic appearing with a clear lung exam. Mild rhinorrhea and OP with erythema but no exudates or tonsillar hypertrophy.  CXR without acute findings.   Sxs today likely due to viral URI.Symptomatic home care instructions discussed. PCP follow up strongly encouraged if symptoms persist. Reasons to return to ER discussed. All questions answered.    Final Clinical Impressions(s) / ED Diagnoses   Final diagnoses:  Cough  Viral URI with cough    ED Discharge Orders    None       Ward, Ozella Almond, PA-C 12/09/18 1010    Lajean Saver, MD 12/09/18 [phone]

## 2018-12-09 NOTE — ED Notes (Signed)
See EDP assessment 

## 2018-12-09 NOTE — ED Notes (Signed)
Pt verbalizes understanding of d/c instructions. Pt ambulatory at d/c with all belongings.

## 2018-12-09 NOTE — Discharge Instructions (Signed)
It was my pleasure taking care of you today!   Your symptoms are likely due to a viral upper respiratory infection. Fortunately, we did not see evidence of serious infection and can treat your symptoms. Flonase or Nasacort can help with nasal congestion. Alternate between Tylenol and ibuprofen as needed for body aches / fevers.   Rest, drink plenty of fluids to be sure you are staying hydrated.   Please follow up with your primary doctor for discussion of your diagnoses and further evaluation after today's visit if symptoms persist longer than 7 days; Return to the ER for high fevers, difficulty breathing or other concerning symptoms

## 2018-12-09 NOTE — ED Triage Notes (Signed)
Pt presents for evaluation of URI symptoms x 3 days. Cough, runny nose, chills.

## 2019-02-22 ENCOUNTER — Other Ambulatory Visit: Payer: Self-pay

## 2019-02-22 ENCOUNTER — Emergency Department (HOSPITAL_COMMUNITY)
Admission: EM | Admit: 2019-02-22 | Discharge: 2019-02-23 | Disposition: A | Discharge status: Other Acute Inpatient Hospital | Payer: Medicare Other | Attending: Emergency Medicine | Admitting: Emergency Medicine

## 2019-02-22 DIAGNOSIS — Z046 Encounter for general psychiatric examination, requested by authority: Secondary | ICD-10-CM | POA: Diagnosis not present

## 2019-02-22 DIAGNOSIS — R45851 Suicidal ideations: Secondary | ICD-10-CM | POA: Insufficient documentation

## 2019-02-22 DIAGNOSIS — Z8579 Personal history of other malignant neoplasms of lymphoid, hematopoietic and related tissues: Secondary | ICD-10-CM | POA: Insufficient documentation

## 2019-02-22 DIAGNOSIS — Z79899 Other long term (current) drug therapy: Secondary | ICD-10-CM | POA: Diagnosis not present

## 2019-02-22 DIAGNOSIS — R45850 Homicidal ideations: Secondary | ICD-10-CM | POA: Insufficient documentation

## 2019-02-22 DIAGNOSIS — Z03818 Encounter for observation for suspected exposure to other biological agents ruled out: Secondary | ICD-10-CM | POA: Diagnosis not present

## 2019-02-22 DIAGNOSIS — Z1159 Encounter for screening for other viral diseases: Secondary | ICD-10-CM | POA: Diagnosis not present

## 2019-02-22 DIAGNOSIS — F17210 Nicotine dependence, cigarettes, uncomplicated: Secondary | ICD-10-CM | POA: Insufficient documentation

## 2019-02-22 DIAGNOSIS — R440 Auditory hallucinations: Secondary | ICD-10-CM | POA: Insufficient documentation

## 2019-02-22 DIAGNOSIS — R4689 Other symptoms and signs involving appearance and behavior: Secondary | ICD-10-CM

## 2019-02-22 DIAGNOSIS — F209 Schizophrenia, unspecified: Secondary | ICD-10-CM | POA: Insufficient documentation

## 2019-02-22 DIAGNOSIS — F200 Paranoid schizophrenia: Secondary | ICD-10-CM | POA: Diagnosis present

## 2019-02-22 DIAGNOSIS — F918 Other conduct disorders: Secondary | ICD-10-CM | POA: Diagnosis not present

## 2019-02-22 DIAGNOSIS — F1414 Cocaine abuse with cocaine-induced mood disorder: Secondary | ICD-10-CM

## 2019-02-22 LAB — CBC WITH DIFFERENTIAL/PLATELET
Abs Immature Granulocytes: 0.02 10*3/uL (ref 0.00–0.07)
Basophils Absolute: 0.1 10*3/uL (ref 0.0–0.1)
Basophils Relative: 1 %
Eosinophils Absolute: 0.9 10*3/uL — ABNORMAL HIGH (ref 0.0–0.5)
Eosinophils Relative: 10 %
HCT: 46.9 % (ref 39.0–52.0)
Hemoglobin: 14.6 g/dL (ref 13.0–17.0)
Immature Granulocytes: 0 %
Lymphocytes Relative: 26 %
Lymphs Abs: 2.3 10*3/uL (ref 0.7–4.0)
MCH: 26.4 pg (ref 26.0–34.0)
MCHC: 31.1 g/dL (ref 30.0–36.0)
MCV: 84.8 fL (ref 80.0–100.0)
Monocytes Absolute: 0.9 10*3/uL (ref 0.1–1.0)
Monocytes Relative: 11 %
Neutro Abs: 4.4 10*3/uL (ref 1.7–7.7)
Neutrophils Relative %: 52 %
Platelets: 395 10*3/uL (ref 150–400)
RBC: 5.53 MIL/uL (ref 4.22–5.81)
RDW: 17.4 % — ABNORMAL HIGH (ref 11.5–15.5)
WBC: 8.7 10*3/uL (ref 4.0–10.5)
nRBC: 0 % (ref 0.0–0.2)

## 2019-02-22 LAB — SALICYLATE LEVEL: Salicylate Lvl: <7 mg/dL (ref 2.8–30.0)

## 2019-02-22 LAB — COMPREHENSIVE METABOLIC PANEL
ALT: 27 U/L (ref 0–44)
AST: 30 U/L (ref 15–41)
Albumin: 4.5 g/dL (ref 3.5–5.0)
Alkaline Phosphatase: 99 U/L (ref 38–126)
Anion gap: 12 (ref 5–15)
BUN: 8 mg/dL (ref 6–20)
CO2: 21 mmol/L — ABNORMAL LOW (ref 22–32)
Calcium: 8.8 mg/dL — ABNORMAL LOW (ref 8.9–10.3)
Chloride: 104 mmol/L (ref 98–111)
Creatinine, Ser: 0.94 mg/dL (ref 0.61–1.24)
GFR calc Af Amer: >60 mL/min (ref >60)
GFR calc non Af Amer: >60 mL/min (ref >60)
Glucose, Bld: 101 mg/dL — ABNORMAL HIGH (ref 70–99)
Potassium: 4 mmol/L (ref 3.5–5.1)
Sodium: 137 mmol/L (ref 135–145)
Total Bilirubin: 0.5 mg/dL (ref 0.3–1.2)
Total Protein: 8.7 g/dL — ABNORMAL HIGH (ref 6.5–8.1)

## 2019-02-22 LAB — RAPID URINE DRUG SCREEN, HOSP PERFORMED
Amphetamines: NOT DETECTED
Barbiturates: NOT DETECTED
Benzodiazepines: NOT DETECTED
Cocaine: POSITIVE — AB
Opiates: NOT DETECTED
Tetrahydrocannabinol: POSITIVE — AB

## 2019-02-22 LAB — ETHANOL: Alcohol, Ethyl (B): 117 mg/dL — ABNORMAL HIGH (ref <10)

## 2019-02-22 LAB — ACETAMINOPHEN LEVEL: Acetaminophen (Tylenol), Serum: <10 ug/mL — ABNORMAL LOW (ref 10–30)

## 2019-02-22 MED ORDER — LORAZEPAM 2 MG/ML IJ SOLN
2.0000 mg | Freq: Once | INTRAMUSCULAR | Status: AC
  Administered 2019-02-22: 2 mg via INTRAVENOUS
  Filled 2019-02-22: qty 1

## 2019-02-22 MED ORDER — STERILE WATER FOR INJECTION IJ SOLN
INTRAMUSCULAR | Status: AC
  Administered 2019-02-22: 1.5 mL
  Filled 2019-02-22: qty 10

## 2019-02-22 MED ORDER — ZIPRASIDONE MESYLATE 20 MG IM SOLR
10.0000 mg | Freq: Once | INTRAMUSCULAR | Status: AC
  Administered 2019-02-22: 10 mg via INTRAMUSCULAR
  Filled 2019-02-22: qty 20

## 2019-02-22 NOTE — ED Notes (Signed)
During nursing ascessment patient denied suicidal or homicidal ideation.

## 2019-02-22 NOTE — Progress Notes (Signed)
   02/22/19 1400  General Assessment Data  Reason for not completing assessment Pt was unable to be assessed due to being medicated.  Pt is currently sedated,  TTS will assess at a later time.    Tesla Keeler L. [place], [place], [facility], [facility] Therapeutic Triage Specialist  [phone]

## 2019-02-22 NOTE — ED Notes (Signed)
Patient woke up from his nap and asked to be able to shower.

## 2019-02-22 NOTE — ED Notes (Signed)
Patient sleeping

## 2019-02-22 NOTE — ED Notes (Signed)
Patient becoming verbally aggressive again.  Calling nurse, "that bitch."  Coming to his doorway and saying he wanted to leave the ED.  This Probation officer called Dr. Sherry Ruffing and he ordered ativan 53m. IM.

## 2019-02-22 NOTE — Progress Notes (Signed)
Received Rai at the change of shift, asleep in his bed and the sitter is at the bedside. He continued to sleep throughout the night until 0500 hrs this morning. He got up to use the bathroom and asked for a glass of water. He returned to bed.

## 2019-02-22 NOTE — ED Notes (Signed)
Patient snoring loudly after getting geodon injection and allowing nurse to obtain blood and urine samples.

## 2019-02-22 NOTE — ED Notes (Signed)
Paperwork faxed to TTS as requested.

## 2019-02-22 NOTE — ED Notes (Signed)
Bed: VO36 Expected date:  Expected time:  Means of arrival:  Comments: IVC, ETOH

## 2019-02-22 NOTE — ED Notes (Signed)
[employer] on standby while nurse prepared medications.  Prior to administration patient acted like he was going to allow nurse to give him the shot then pulled away as nurse was administering the medication.  Patient continually calling nurse bitch and threatening everyone in the room.

## 2019-02-22 NOTE — ED Notes (Signed)
Patient becoming very aggressive verbally.  Refusing vital signs, blood draws, and also refusing to give a urine sample.  Security called to unit for standby.  Patient upset that he is here and says the accusations that he is suicidal are false.
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
## 2019-02-23 ENCOUNTER — Other Ambulatory Visit: Payer: Self-pay

## 2019-02-23 ENCOUNTER — Observation Stay (HOSPITAL_COMMUNITY)
Admission: AD | Admit: 2019-02-23 | Discharge: 2019-02-24 | Disposition: A | Discharge status: Home / Self Care | Payer: Medicare Other | Source: Intra-hospital | Attending: Psychiatry | Admitting: Psychiatry

## 2019-02-23 ENCOUNTER — Encounter (HOSPITAL_COMMUNITY): Payer: Self-pay | Admitting: Behavioral Health

## 2019-02-23 ENCOUNTER — Encounter (HOSPITAL_COMMUNITY): Payer: Self-pay

## 2019-02-23 DIAGNOSIS — Z1159 Encounter for screening for other viral diseases: Secondary | ICD-10-CM | POA: Diagnosis not present

## 2019-02-23 DIAGNOSIS — D571 Sickle-cell disease without crisis: Secondary | ICD-10-CM | POA: Diagnosis not present

## 2019-02-23 DIAGNOSIS — F1414 Cocaine abuse with cocaine-induced mood disorder: Secondary | ICD-10-CM | POA: Diagnosis present

## 2019-02-23 DIAGNOSIS — Z79899 Other long term (current) drug therapy: Secondary | ICD-10-CM | POA: Diagnosis not present

## 2019-02-23 DIAGNOSIS — F209 Schizophrenia, unspecified: Secondary | ICD-10-CM | POA: Diagnosis not present

## 2019-02-23 DIAGNOSIS — F1424 Cocaine dependence with cocaine-induced mood disorder: Secondary | ICD-10-CM

## 2019-02-23 DIAGNOSIS — K5090 Crohn's disease, unspecified, without complications: Secondary | ICD-10-CM | POA: Insufficient documentation

## 2019-02-23 LAB — SARS CORONAVIRUS 2 BY RT PCR (HOSPITAL ORDER, PERFORMED IN ~~LOC~~ HOSPITAL LAB): SARS Coronavirus 2: NEGATIVE

## 2019-02-23 MED ORDER — BENZTROPINE MESYLATE 1 MG PO TABS
1.0000 mg | ORAL_TABLET | Freq: Every day | ORAL | Status: DC
  Administered 2019-02-23: 1 mg via ORAL
  Filled 2019-02-23: qty 1

## 2019-02-23 MED ORDER — BENZTROPINE MESYLATE 1 MG PO TABS: 1.0000 mg | ORAL_TABLET | Freq: Every day | ORAL | Status: DC

## 2019-02-23 MED ORDER — HYDROXYZINE HCL 25 MG PO TABS
25.0000 mg | ORAL_TABLET | Freq: Three times a day (TID) | ORAL | Status: DC | PRN
  Administered 2019-02-24: 25 mg via ORAL
  Filled 2019-02-23: qty 1

## 2019-02-23 MED ORDER — LORAZEPAM 2 MG/ML IJ SOLN
2.0000 mg | Freq: Once | INTRAMUSCULAR | Status: AC
  Administered 2019-02-23: 2 mg via INTRAMUSCULAR
  Filled 2019-02-23: qty 1

## 2019-02-23 MED ORDER — AMANTADINE HCL 100 MG PO CAPS
100.0000 mg | ORAL_CAPSULE | Freq: Two times a day (BID) | ORAL | Status: DC
  Administered 2019-02-23: 100 mg via ORAL
  Filled 2019-02-23: qty 1

## 2019-02-23 MED ORDER — HALOPERIDOL 5 MG PO TABS: 10.0000 mg | ORAL_TABLET | Freq: Every day | ORAL | Status: DC

## 2019-02-23 MED ORDER — HALOPERIDOL 5 MG PO TABS
10.0000 mg | ORAL_TABLET | Freq: Every day | ORAL | Status: DC
  Administered 2019-02-23: 10 mg via ORAL
  Filled 2019-02-23: qty 2

## 2019-02-23 MED ORDER — HALOPERIDOL DECANOATE 100 MG/ML IM SOLN
50.0000 mg | Freq: Once | INTRAMUSCULAR | Status: AC
  Administered 2019-02-23: 50 mg via INTRAMUSCULAR
  Filled 2019-02-23: qty 0.5

## 2019-02-23 MED ORDER — DIPHENHYDRAMINE HCL 50 MG/ML IJ SOLN
50.0000 mg | Freq: Once | INTRAMUSCULAR | Status: AC
  Administered 2019-02-23: 50 mg via INTRAMUSCULAR
  Filled 2019-02-23: qty 1

## 2019-02-23 MED ORDER — GABAPENTIN 300 MG PO CAPS
300.0000 mg | ORAL_CAPSULE | Freq: Two times a day (BID) | ORAL | Status: DC
  Administered 2019-02-23 – 2019-02-24 (×2): 300 mg via ORAL
  Filled 2019-02-23 (×2): qty 1

## 2019-02-23 MED ORDER — LORAZEPAM 2 MG/ML IJ SOLN: 2.0000 mg | Freq: Once | INTRAMUSCULAR | Status: DC

## 2019-02-23 MED ORDER — ACETAMINOPHEN 325 MG PO TABS: 650.0000 mg | ORAL_TABLET | Freq: Four times a day (QID) | ORAL | Status: DC | PRN

## 2019-02-23 MED ORDER — MAGNESIUM HYDROXIDE 400 MG/5ML PO SUSP: 30.0000 mL | Freq: Every day | ORAL | Status: DC | PRN

## 2019-02-23 MED ORDER — GABAPENTIN 300 MG PO CAPS
300.0000 mg | ORAL_CAPSULE | Freq: Two times a day (BID) | ORAL | Status: DC
  Administered 2019-02-23: 300 mg via ORAL
  Filled 2019-02-23: qty 1

## 2019-02-23 MED ORDER — TRAZODONE HCL 50 MG PO TABS: 50.0000 mg | ORAL_TABLET | Freq: Every evening | ORAL | Status: DC | PRN

## 2019-02-23 MED ORDER — ALUM & MAG HYDROXIDE-SIMETH 200-200-20 MG/5ML PO SUSP: 30.0000 mL | ORAL | Status: DC | PRN

## 2019-02-23 NOTE — ED Notes (Signed)
Report given to Su Grand at [facility]. They will call when they are ready for pt to be transported.

## 2019-02-23 NOTE — ED Provider Notes (Signed)
Vitals:   02/22/19 1752 02/23/19 0636  BP: (!) 150/93 139/88  Pulse: 80 92  Resp: 18 17  Temp: 99.1 F (37.3 C) 98.7 F (37.1 C)  SpO2: 97% 98%   Vital signs stable.  Pending psychiatric evaluation.   Dorie Rank, MD 02/23/19 [phone]

## 2019-02-23 NOTE — ED Notes (Signed)
Pleasant with staff. Allowed injections to be given. Cooperating with care plan.

## 2019-02-23 NOTE — ED Notes (Signed)
Pt would like to be discharged. Has been calm and cooperative.

## 2019-02-23 NOTE — ED Notes (Addendum)
Patient allowed covid 19 test. Denies exposure or symptoms. The test was walked to the lab.

## 2019-02-23 NOTE — Progress Notes (Signed)
Patient ID: [name], male   DOB: December 09, 1980, 38 y.o.   MRN: [id number]  Pt will remain in the emergency room for 24 hours for medication efficacy and observation for any potential adverse effects. Pt was placed on Haldol decanoate IM 50 mg today. His next IM injection will be due on or around March 23, 2019.   Ethelene Hal, FNP-C 02/23/2019    1259

## 2019-02-23 NOTE — ED Notes (Signed)
Awake for vitals and requested a snack. Writer informed he would be transferring to [facility] and waiting on [employer] to come to transport him.  He asked who petitioned him, said" it was my mom wasn't it?" Angry with her, states all she does is get high and then upset with him." Writer told him I was unsure of who the petitioner was, he made the assumption. He is irritable but not aggressive or unpleasant with Probation officer. [employer] here to transport. He preferred to take his medications once settled in at [facility], states they make him sleepy. In good condition on discharge. IVC paperwork with [employer]. Ambulated off unit. All belongings went with him.

## 2019-02-23 NOTE — Progress Notes (Signed)
[name] is a 38 year old male being admitted involuntarily to 76 from [place].  He was brought in to the ED under IVC initiated by his mother.  He was petitioned for physical aggression, SI/HI and hallucinations.  During OBS admission, he was pleasant but guarded.  Minimal with questions but pleasant.  He reported "someone lied on me.  I didn't do anything.  This is bullshit."  Oriented him to the unit.  BH-OBS paperwork completed and signed.  Belongings secured in tamper resistant bag and placed in locker # 50.  No contraband found.  Skin assessment completed and no skin issues noted.  Q 15 minute checks initiated for safety.

## 2019-02-23 NOTE — Plan of Care (Signed)
[facility] Observation Crisis Plan  Reason for Crisis Plan:  Crisis Stabilization   Plan of Care:  Referral for Telepsychiatry/Psychiatric Consult  Family Support:      Current Living Environment:  Living Arrangements: Parent  Insurance:   Hospital Account    Name Acct ID Class Status Primary Coverage   [name], [name] [id number] [place] - MEDICARE PART A AND B        Guarantor Account (for Hospital Account [id number])    Name Relation to Pt Service Area Active? Acct Type   [name] Self [facility] Yes Behavioral Health   Address Phone       [address] [place], [place] [address] [phone])          Coverage Information (for Hospital Account [id number])    F/O Payor/Plan Precert #   MEDICARE/MEDICARE PART A AND B    Subscriber Subscriber #   [name], [name] [id number]   Address Phone   [address] [place], [place] [address]       Legal Guardian:     Primary Care Provider:  Medicine, [facility]  Current Outpatient Providers:  unknown  Psychiatrist:     Counselor/Therapist:     Compliant with Medications:  No  Additional Information:   [name] 5/10/202010:34 PM
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
## 2019-02-24 DIAGNOSIS — F1414 Cocaine abuse with cocaine-induced mood disorder: Secondary | ICD-10-CM | POA: Diagnosis not present

## 2019-02-24 NOTE — Progress Notes (Signed)
D: Pt A & O X 3. Presents with blunted affect and anxious mood but has been cooperative with care. Denies SI, HI, AVH and pain at this time. D/C home as ordered. Instructions given to the bus stop. Pt's mother was notified of pt's discharge status per duty to warn by [facility]). A: D/C instructions reviewed with pt including follow up appointments; compliance encouraged. All belongings from locker # 71 returned to pt at time of departure. Scheduled and PRN medications given with verbal education and effects monitored. Safety checks maintained without incident till time of d/c.  R: Pt receptive to care. Compliant with medications when offered. Denies adverse drug reactions when assessed. Verbalized understanding related to d/c instructions. Signed belonging sheet in agreement with items received from locker. Ambulatory with a steady gait. Appears to be in no physical distress at time of departure.

## 2019-02-24 NOTE — BH Assessment (Signed)
[facility] Assessment Progress Note  Per Buford Dresser, DO, this pt does not require psychiatric hospitalization at this time.  Pt presents under IVC initiated by pt's mother on 02/22/2019.  However, a First Examination was not completed within 24 hours of pt's arrival at the examination site, and IVC therefore expired.  Pt is to be discharged from [facility] with recommendation to continue treatment with the [facility].  This has been included in pt's discharge instructions.  At the request of Dr Mariea Clonts, this writer called pt's mother/petitioner, Joselito Fieldhouse to exercise duty to warn.  Call was placed at 11:40, and I reached her in person.  Pt's nurse, Nicoletta Dress, has been notified.  Jalene Mullet, [place] Triage Specialist [phone]

## 2019-02-24 NOTE — Discharge Instructions (Signed)
Stimulant Use Disorder-Cocaine Cocaine belongs to a group of powerful drugs known as stimulants. Common street names for cocaine include coke, crack, blow, snow, C, powder, and nose candy. Cocaine has some medical uses, but it is often misused because of the effects that it produces. These effects include:  A feeling of extreme pleasure (euphoria).  Alertness.  A high energy level. Stimulant use disorder is when your stimulant use disrupts your daily life. It may disrupt your relationships and how you do your job. Stimulant use disorder can be dangerous. Cocaine increases your blood pressure and heart rate. Using it can lead to a heart attack or stroke. Cocaine can also make your heart rate irregular and cause seizures. These problems can lead to death. What are the causes? This condition is caused by misusing cocaine. Many people start using cocaine because it makes them feel good. Over time, they get addicted to it. When they try to stop using it, they feel sick. What increases the risk? This condition is more likely to develop in:  People who misuse other drugs.  People with a family history of misusing drugs. What are the signs or symptoms? Symptoms of this condition include:  Using greater amounts of cocaine than you want to, or using cocaine for longer than you want to.  Trying several times to use less cocaine or to control your cocaine use.  Craving cocaine.  Spending a lot of time getting cocaine, using it, or recovering from its effects.  Having problems at work, at school, at home, or with relationships because of cocaine use.  Giving up or cutting down on important life activities because of cocaine use.  Using cocaine when it is dangerous, such as when driving a car.  Continuing to use cocaine even though it is causing or has led to a physical problem, such as: ? Malnutrition. ? Nosebleeds. ? Chest pain. ? High blood pressure. ? A hole between the part of your nose  that separates your nostrils (perforated nasal septum). ? Lung and kidney damage.  Continuing to use cocaine even though it is causing a mental problem, such as: ? Schizophrenia-like symptoms. ? Depression. ? Bipolar mood swings. ? Anxiety. ? Sleep problems.  Needing more and more cocaine to get the same effect that you want (building up a tolerance).  Having symptoms of withdrawal when you stop using cocaine. Symptoms of withdrawal include: ? Depression. ? Irritability. ? Low energy. ? Restlessness. ? Bad dreams. ? Too little or too much sleep. ? Increased appetite. How is this diagnosed? This condition is diagnosed with an assessment. During the assessment, your health care provider will ask about your cocaine use and about how it affects your life. Your health care provider may also:  Perform a physical exam or do lab tests to see if you have physical problems resulting from cocaine use.  Screen for drug use.  Refer you to a mental health professional for evaluation. How is this treated? Treatment for this condition is usually provided by mental health professionals with training in substance use disorders. Treatment may involve:  Counseling. This treatment is also called talk therapy. It is provided by substance use treatment counselors. A counselor can address the reasons you use cocaine and suggest ways to keep you from using it again. The goals of talk therapy are to: ? Find healthy activities to replace using cocaine. ? Identify and avoid what triggers your cocaine use. ? Help you learn how to handle cravings.  Support groups.  Support groups are run by people who have quit using stimulants. They provide emotional support, advice, and guidance.  Medicines. Follow these instructions at home:  Take over-the-counter and prescription medicines only as told by your health care provider.  Check with your health care provider before starting any new medicines.  Do not use  any products that contain nicotine or tobacco, such as cigarettes and e-cigarettes. If you need help quitting, ask your health care provider.  Keep all follow-up visits as told by your health care provider. This is important. Where to find more information  [employer] on Drug Abuse: motorcyclefax.com  Substance Abuse and Mental Health Services Administration: ktimeonline.com Contact a health care provider if:  You are not able to take your medicines as told.  You use cocaine again.  Your symptoms get worse. Get help right away if:  You have serious thoughts about hurting yourself or others.  You have a seizure.  You have chest pain.  You have sudden weakness.  You lose some of your vision.  You lose some of your speech. If you ever feel like you may hurt yourself or others, or have thoughts about taking your own life, get help right away. You can go to your nearest emergency department or call:  Your local emergency services (911 in the [place]  A suicide crisis helpline, such as the [facility] at 214-339-1322. This is open 24 hours a day. This information is not intended to replace advice given to you by your health care provider. Make sure you discuss any questions you have with your health care provider. Document Released: 09/29/2000 Document Revised: 07/14/2016 Document Reviewed: 07/14/2016 Elsevier Interactive Patient Education  2019 [employer].  For your behavioral health needs, you are advised to continue treatment with the [facility]:       [facility]      [address]      [place], [place] [address]      [phone]
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
## 2019-03-16 ENCOUNTER — Emergency Department (HOSPITAL_COMMUNITY): Payer: Medicare Other

## 2019-03-16 ENCOUNTER — Encounter (HOSPITAL_COMMUNITY): Payer: Self-pay | Admitting: Emergency Medicine

## 2019-03-16 ENCOUNTER — Other Ambulatory Visit: Payer: Self-pay

## 2019-03-16 ENCOUNTER — Emergency Department (HOSPITAL_COMMUNITY)
Admission: EM | Admit: 2019-03-16 | Discharge: 2019-03-16 | Disposition: A | Discharge status: Home / Self Care | Payer: Medicare Other | Attending: Emergency Medicine | Admitting: Emergency Medicine

## 2019-03-16 DIAGNOSIS — K5090 Crohn's disease, unspecified, without complications: Secondary | ICD-10-CM | POA: Diagnosis not present

## 2019-03-16 DIAGNOSIS — R590 Localized enlarged lymph nodes: Secondary | ICD-10-CM | POA: Diagnosis not present

## 2019-03-16 DIAGNOSIS — R1084 Generalized abdominal pain: Secondary | ICD-10-CM

## 2019-03-16 DIAGNOSIS — F17210 Nicotine dependence, cigarettes, uncomplicated: Secondary | ICD-10-CM | POA: Diagnosis not present

## 2019-03-16 DIAGNOSIS — R109 Unspecified abdominal pain: Secondary | ICD-10-CM | POA: Diagnosis present

## 2019-03-16 DIAGNOSIS — Z79899 Other long term (current) drug therapy: Secondary | ICD-10-CM | POA: Insufficient documentation

## 2019-03-16 LAB — CBC
HCT: 43.9 % (ref 39.0–52.0)
Hemoglobin: 14.1 g/dL (ref 13.0–17.0)
MCH: 26.7 pg (ref 26.0–34.0)
MCHC: 32.1 g/dL (ref 30.0–36.0)
MCV: 83.1 fL (ref 80.0–100.0)
Platelets: 398 10*3/uL (ref 150–400)
RBC: 5.28 MIL/uL (ref 4.22–5.81)
RDW: 16.3 % — ABNORMAL HIGH (ref 11.5–15.5)
WBC: 12.1 10*3/uL — ABNORMAL HIGH (ref 4.0–10.5)
nRBC: 0 % (ref 0.0–0.2)

## 2019-03-16 LAB — COMPREHENSIVE METABOLIC PANEL
ALT: 25 U/L (ref 0–44)
AST: 22 U/L (ref 15–41)
Albumin: 3.8 g/dL (ref 3.5–5.0)
Alkaline Phosphatase: 94 U/L (ref 38–126)
Anion gap: 12 (ref 5–15)
BUN: 14 mg/dL (ref 6–20)
CO2: 21 mmol/L — ABNORMAL LOW (ref 22–32)
Calcium: 8.6 mg/dL — ABNORMAL LOW (ref 8.9–10.3)
Chloride: 103 mmol/L (ref 98–111)
Creatinine, Ser: 0.92 mg/dL (ref 0.61–1.24)
GFR calc Af Amer: >60 mL/min (ref >60)
GFR calc non Af Amer: >60 mL/min (ref >60)
Glucose, Bld: 103 mg/dL — ABNORMAL HIGH (ref 70–99)
Potassium: 3.6 mmol/L (ref 3.5–5.1)
Sodium: 136 mmol/L (ref 135–145)
Total Bilirubin: 0.4 mg/dL (ref 0.3–1.2)
Total Protein: 7.3 g/dL (ref 6.5–8.1)

## 2019-03-16 LAB — LIPASE, BLOOD: Lipase: 22 U/L (ref 11–51)

## 2019-03-16 MED ORDER — IOHEXOL 300 MG/ML  SOLN
125.0000 mL | Freq: Once | INTRAMUSCULAR | Status: AC | PRN
  Administered 2019-03-16: 23:00:00 125 mL via INTRAVENOUS

## 2019-03-16 MED ORDER — FENTANYL CITRATE (PF) 100 MCG/2ML IJ SOLN
50.0000 ug | Freq: Once | INTRAMUSCULAR | Status: AC
  Administered 2019-03-16: 50 ug via INTRAVENOUS
  Filled 2019-03-16: qty 2

## 2019-03-16 MED ORDER — SODIUM CHLORIDE 0.9% FLUSH: 3.0000 mL | Freq: Once | INTRAVENOUS | Status: DC

## 2019-03-16 MED ORDER — ONDANSETRON HCL 4 MG PO TABS: 4.0000 mg | ORAL_TABLET | Freq: Three times a day (TID) | ORAL | 0 refills | Status: DC | PRN

## 2019-03-16 MED ORDER — DICYCLOMINE HCL 20 MG PO TABS: 20.0000 mg | ORAL_TABLET | Freq: Two times a day (BID) | ORAL | 0 refills | Status: DC

## 2019-03-16 MED ORDER — ONDANSETRON HCL 4 MG/2ML IJ SOLN
4.0000 mg | Freq: Once | INTRAMUSCULAR | Status: AC
  Administered 2019-03-16: 4 mg via INTRAVENOUS
  Filled 2019-03-16: qty 2

## 2019-03-16 NOTE — Discharge Instructions (Signed)
Your ct scan was negative for any abnormalities today. Abdominal (belly) pain can be caused by many things. Your caregiver performed an examination and possibly ordered blood/urine tests and imaging (CT scan, x-rays, ultrasound). Many cases can be observed and treated at home after initial evaluation in the emergency department. Even though you are being discharged home, abdominal pain can be unpredictable. Therefore, you need a repeated exam if your pain does not resolve, returns, or worsens. Most patients with abdominal pain don't have to be admitted to the hospital or have surgery, but serious problems like appendicitis and gallbladder attacks can start out as nonspecific pain. Many abdominal conditions cannot be diagnosed in one visit, so follow-up evaluations are very important. SEEK IMMEDIATE MEDICAL ATTENTION IF: The pain does not go away or becomes severe.  A temperature above 101 develops.  Repeated vomiting occurs (multiple episodes).  The pain becomes localized to portions of the abdomen. The right side could possibly be appendicitis. In an adult, the left lower portion of the abdomen could be colitis or diverticulitis.  Blood is being passed in stools or vomit (bright red or black tarry stools).  Return also if you develop chest pain, difficulty breathing, dizziness or fainting, or become confused, poorly responsive, or inconsolable (young children).

## 2019-03-16 NOTE — ED Notes (Signed)
CT tech at bedside with instructions for oral contrast

## 2019-03-16 NOTE — ED Triage Notes (Signed)
Pt reports generalized abd pain that radiates into back for the past 4-5 days, pt also have n/v/d.

## 2019-03-16 NOTE — ED Notes (Signed)
TTS in progress 

## 2019-03-16 NOTE — ED Provider Notes (Signed)
[facility] EMERGENCY DEPARTMENT Provider Note   CSN: [id number] Arrival date & time: 03/16/19  1954    History   Chief Complaint Chief Complaint  Patient presents with  . Abdominal Pain    HPI [name] is a 38 y.o. male who  has a past medical history of Crohn's disease ([place]), GI (gastrointestinal bleed) (04/2013), Multiple myeloma ([place]), Schizophrenia ([place]), Sickle cell anemia ([place]), Suicide attempt by drug ingestion ([place]), and Ulcerative colitis. Patient states that he has had 7 days of neurolysed abdominal pain, cramping.  It radiates into the lateral abdominal walls under the right and left rib cage.  He denies flank pain, urinary symptoms.  He has had some nausea and intermittent vomiting along with diarrhea that not bloody or mucoid.  Patient states that he has had decreased appetite.  Feels that it is the same as his previous episodes of Crohn's colitis flare.     HPI  Past Medical History:  Diagnosis Date  . Crohn's disease ([place])   . GI (gastrointestinal bleed) 04/2013  . Multiple myeloma ([place])   . Schizophrenia ([place])   . Sickle cell anemia (H[facility])   . Suicide attempt by drug ingestion ([place])   . Ulcerative colitis     Patient Active Problem List   Diagnosis Date Noted  . Schizophrenia ([place]) 02/23/2019  . Non compliance w medication regimen   . Cocaine use disorder, moderate, dependence ([place]) 06/09/2016  . Cannabis use disorder, severe, dependence ([place]) 06/09/2016  . Cocaine dependence with cocaine-induced mood disorder ([place]) 03/19/2016  . Cocaine abuse with cocaine-induced mood disorder ([place]) 03/03/2016  . Cocaine abuse ([place]) 03/03/2016  . Suicidal ideation   . Reflux esophagitis 05/24/2013  . Other specified gastritis without mention of hemorrhage 05/24/2013  . Nausea with vomiting 05/23/2013  . Anemia, unspecified 05/23/2013  . Nonspecific abnormal finding in stool contents 05/23/2013  . Paranoid schizophrenia ([place])   . Ulcerative  colitis ([place])   . SUBSTANCE ABUSE, MULTIPLE 12/31/2007    Past Surgical History:  Procedure Laterality Date  . ESOPHAGOGASTRODUODENOSCOPY N/A 05/24/2013   Procedure: ESOPHAGOGASTRODUODENOSCOPY (EGD);  Surgeon: Ladene Artist, MD;  Location: [facility] ENDOSCOPY;  Service: Endoscopy;  Laterality: N/A;  . FLEXIBLE SIGMOIDOSCOPY  12/05/2011   Procedure: FLEXIBLE SIGMOIDOSCOPY;  Surgeon: Beryle Beams, MD;  Location: [facility] ENDOSCOPY;  Service: Endoscopy;  Laterality: N/A;  . MANDIBLE FRACTURE SURGERY          Home Medications    Prior to Admission medications   Medication Sig Start Date End Date Taking? Authorizing Provider  benztropine (COGENTIN) 1 MG tablet Take 1 mg by mouth at bedtime.     [provider]  haloperidol (HALDOL) 10 MG tablet Take 10 mg by mouth at bedtime.  02/26/18   [provider]  haloperidol decanoate (HALDOL DECANOATE) 100 MG/ML injection Inject 100 mg into the muscle every 28 (twenty-eight) days. 02/14/18   [provider]    Family History Family History  Problem Relation Age of Onset  . Liver cancer Other     Social History Social History   Tobacco Use  . Smoking status: Current Some Day Smoker    Packs/day: 0.25    Years: 20.00    Pack years: 5.00    Types: Cigarettes  . Smokeless tobacco: Never Used  Substance Use Topics  . Alcohol use: Yes    Comment: BAC 117  . Drug use: Yes    Types: Cocaine, Marijuana    Comment: +THC; +COC  Allergies   Depakote [divalproex sodium]; Lithium; Nsaids; and Risperdal [risperidone]   Review of Systems Review of Systems   Physical Exam Updated Vital Signs BP 139/87 (BP Location: Right Arm)   Pulse 100   Temp 98.1 F (36.7 C) (Oral)   Resp 16   SpO2 98%   Physical Exam   ED Treatments / Results  Labs (all labs ordered are listed, but only abnormal results are displayed) Labs Reviewed  CBC - Abnormal; Notable for the following components:      Result Value   WBC 12.1 (*)     RDW 16.3 (*)    All other components within normal limits  LIPASE, BLOOD  COMPREHENSIVE METABOLIC PANEL  URINALYSIS, ROUTINE W REFLEX MICROSCOPIC    EKG EKG Interpretation  Date/Time:  Sunday Mar 16 2019 20:08:37 EDT Ventricular Rate:  92 PR Interval:  184 QRS Duration: 84 QT Interval:  370 QTC Calculation: 457 R Axis:   36 Text Interpretation:  Normal sinus rhythm Possible Left atrial enlargement Borderline ECG no significant change since May 2019 Confirmed by Sherwood Gambler [phone]) on 03/16/2019 8:37:36 PM   Radiology No results found.  Procedures Procedures (including critical care time)  Medications Ordered in ED Medications  sodium chloride flush (NS) 0.9 % injection 3 mL (has no administration in time range)     Initial Impression / Assessment and Plan / ED Course  I have reviewed the triage vital signs and the nursing notes.  Pertinent labs & imaging results that were available during my care of the patient were reviewed by me and considered in my medical decision making (see chart for details).        CC: Abdominal pain VS:  Vitals:   03/16/19 2000 03/16/19 2003 03/16/19 2230 03/16/19 2339  BP:  139/87 (!) 139/103 (!) 137/107  Pulse: 100  76 75  Resp: 16  (!) 24 18  Temp: 98.1 F (36.7 C)     TempSrc: Oral     SpO2: 98%  100% 98%    QP:RFFMBWG is gathered by the patient and review of EMR. DDX:The causes for generalized abdorminal pain include but are not limited to gastritis, gastroenteritis, IBS, pancreatitis, peritonitis, intestinal ischemia, constipation, UTI, intestinal obstruction, perforated viscus, eg, peptic ulcer, appendix, gallbladder, diverticulitis, ruptured spleen, physical or sexual abuse, abdominal abscess,AAA, diabetic ketoacidosis, hypercalcemia, uremia, parasitic or other infection, eg: tapeworms, flukes, Giargia, Cryto, Yersinia, adrenal insufficiency,lead poisoning, iron toxicity, polyarteritis nodosa, Henoch-Schnlein purpura,  porphyria, eg, acute intermittent porphyria, familial Mediterranean fever. Labs: I reviewed the labs which show slightly elevated blood glucose, mild leukocytosis, normal lipase Imaging: I personally reviewed the images (CT abdomen and pelvis with contrast) which show(s) chronic lymph node abnormalities without evidence of acute infection or Crohn's colitis EKG:  EKG Interpretation  Date/Time:  Sunday Mar 16 2019 20:08:37 EDT Ventricular Rate:  92 PR Interval:  184 QRS Duration: 84 QT Interval:  370 QTC Calculation: 457 R Axis:   36 Text Interpretation:  Normal sinus rhythm Possible Left atrial enlargement Borderline ECG no significant change since May 2019 Confirmed by Sherwood Gambler [phone]) on 03/16/2019 8:37:36 PM        MDM: Patient here with complaint of abdominal pain, no active vomiting.  Pain treated while in the emergency department.  Differential includes gastritis, Crohn's colitis.  I also thought about aortic dissection or AAA in the setting of the patient's chronic cocaine abuse however he is very well-appearing with only minimal abdominal tenderness, only slightly elevated blood pressure,  no ripping or tearing sensations.  I have low suspicion for any emergent cause of the patient's symptoms and he has had no active vomiting in the emergency department without signs of significant dehydration on the patient's laboratory results Patient disposition: Discharge with symptomatic treatment Patient condition: Good. The patient appears reasonably screened and/or stabilized for discharge and I doubt any other medical condition or other [facility] requiring further screening, evaluation, or treatment in the ED at this time prior to discharge. I have discussed lab and/or imaging findings with the patient and answered all questions/concerns to the best of my ability. I have discussed return precautions and OP follow up.      Final Clinical Impressions(s) / ED Diagnoses   Final diagnoses:   None    ED Discharge Orders    None       Margarita Mail, PA-C 03/17/19 [place], MD 03/18/19 1623

## 2019-03-17 ENCOUNTER — Other Ambulatory Visit: Payer: Self-pay

## 2019-03-17 ENCOUNTER — Emergency Department (HOSPITAL_COMMUNITY)
Admission: EM | Admit: 2019-03-17 | Discharge: 2019-03-17 | Disposition: A | Discharge status: Home / Self Care | Payer: Medicare Other | Attending: Emergency Medicine | Admitting: Emergency Medicine

## 2019-03-17 ENCOUNTER — Encounter (HOSPITAL_COMMUNITY): Payer: Self-pay | Admitting: Emergency Medicine

## 2019-03-17 DIAGNOSIS — F1210 Cannabis abuse, uncomplicated: Secondary | ICD-10-CM | POA: Diagnosis not present

## 2019-03-17 DIAGNOSIS — R112 Nausea with vomiting, unspecified: Secondary | ICD-10-CM | POA: Diagnosis not present

## 2019-03-17 DIAGNOSIS — F17210 Nicotine dependence, cigarettes, uncomplicated: Secondary | ICD-10-CM | POA: Insufficient documentation

## 2019-03-17 DIAGNOSIS — F1410 Cocaine abuse, uncomplicated: Secondary | ICD-10-CM | POA: Diagnosis not present

## 2019-03-17 DIAGNOSIS — Z79899 Other long term (current) drug therapy: Secondary | ICD-10-CM | POA: Insufficient documentation

## 2019-03-17 DIAGNOSIS — R109 Unspecified abdominal pain: Secondary | ICD-10-CM | POA: Diagnosis present

## 2019-03-17 DIAGNOSIS — R1084 Generalized abdominal pain: Secondary | ICD-10-CM | POA: Insufficient documentation

## 2019-03-17 LAB — RETICULOCYTES
Immature Retic Fract: 22.8 % — ABNORMAL HIGH (ref 2.3–15.9)
RBC.: 5.22 MIL/uL (ref 4.22–5.81)
Retic Count, Absolute: 77.8 10*3/uL (ref 19.0–186.0)
Retic Ct Pct: 1.5 % (ref 0.4–3.1)

## 2019-03-17 LAB — COMPREHENSIVE METABOLIC PANEL
ALT: 23 U/L (ref 0–44)
AST: 21 U/L (ref 15–41)
Albumin: 3.8 g/dL (ref 3.5–5.0)
Alkaline Phosphatase: 94 U/L (ref 38–126)
Anion gap: 10 (ref 5–15)
BUN: 9 mg/dL (ref 6–20)
CO2: 26 mmol/L (ref 22–32)
Calcium: 9.1 mg/dL (ref 8.9–10.3)
Chloride: 99 mmol/L (ref 98–111)
Creatinine, Ser: 0.98 mg/dL (ref 0.61–1.24)
GFR calc Af Amer: >60 mL/min (ref >60)
GFR calc non Af Amer: >60 mL/min (ref >60)
Glucose, Bld: 102 mg/dL — ABNORMAL HIGH (ref 70–99)
Potassium: 3.7 mmol/L (ref 3.5–5.1)
Sodium: 135 mmol/L (ref 135–145)
Total Bilirubin: 0.7 mg/dL (ref 0.3–1.2)
Total Protein: 7.6 g/dL (ref 6.5–8.1)

## 2019-03-17 LAB — CBC WITH DIFFERENTIAL/PLATELET
Abs Immature Granulocytes: 0.05 10*3/uL (ref 0.00–0.07)
Basophils Absolute: 0.1 10*3/uL (ref 0.0–0.1)
Basophils Relative: 1 %
Eosinophils Absolute: 0.5 10*3/uL (ref 0.0–0.5)
Eosinophils Relative: 3 %
HCT: 43 % (ref 39.0–52.0)
Hemoglobin: 14 g/dL (ref 13.0–17.0)
Immature Granulocytes: 0 %
Lymphocytes Relative: 10 %
Lymphs Abs: 1.3 10*3/uL (ref 0.7–4.0)
MCH: 26.8 pg (ref 26.0–34.0)
MCHC: 32.6 g/dL (ref 30.0–36.0)
MCV: 82.4 fL (ref 80.0–100.0)
Monocytes Absolute: 0.9 10*3/uL (ref 0.1–1.0)
Monocytes Relative: 7 %
Neutro Abs: 10.7 10*3/uL — ABNORMAL HIGH (ref 1.7–7.7)
Neutrophils Relative %: 79 %
Platelets: 396 10*3/uL (ref 150–400)
RBC: 5.22 MIL/uL (ref 4.22–5.81)
RDW: 16.1 % — ABNORMAL HIGH (ref 11.5–15.5)
WBC: 13.5 10*3/uL — ABNORMAL HIGH (ref 4.0–10.5)
nRBC: 0 % (ref 0.0–0.2)

## 2019-03-17 LAB — URINALYSIS, ROUTINE W REFLEX MICROSCOPIC
Bilirubin Urine: NEGATIVE
Glucose, UA: NEGATIVE mg/dL
Hgb urine dipstick: NEGATIVE
Ketones, ur: NEGATIVE mg/dL
Leukocytes,Ua: NEGATIVE
Nitrite: NEGATIVE
Protein, ur: NEGATIVE mg/dL
Specific Gravity, Urine: 1.002 — ABNORMAL LOW (ref 1.005–1.030)
pH: 7 (ref 5.0–8.0)

## 2019-03-17 LAB — RAPID URINE DRUG SCREEN, HOSP PERFORMED
Amphetamines: NOT DETECTED
Barbiturates: NOT DETECTED
Benzodiazepines: NOT DETECTED
Cocaine: NOT DETECTED
Opiates: NOT DETECTED
Tetrahydrocannabinol: NOT DETECTED

## 2019-03-17 LAB — LIPASE, BLOOD: Lipase: 20 U/L (ref 11–51)

## 2019-03-17 MED ORDER — ALUM & MAG HYDROXIDE-SIMETH 200-200-20 MG/5ML PO SUSP
30.0000 mL | Freq: Once | ORAL | Status: AC
  Administered 2019-03-17: 30 mL via ORAL
  Filled 2019-03-17: qty 30

## 2019-03-17 MED ORDER — SODIUM CHLORIDE 0.9 % IV BOLUS
1000.0000 mL | Freq: Once | INTRAVENOUS | Status: AC
  Administered 2019-03-17: 1000 mL via INTRAVENOUS

## 2019-03-17 MED ORDER — DIPHENHYDRAMINE HCL 50 MG/ML IJ SOLN
12.5000 mg | Freq: Once | INTRAMUSCULAR | Status: AC
  Administered 2019-03-17: 12.5 mg via INTRAVENOUS
  Filled 2019-03-17: qty 1

## 2019-03-17 MED ORDER — HALOPERIDOL LACTATE 5 MG/ML IJ SOLN
5.0000 mg | Freq: Once | INTRAMUSCULAR | Status: AC
  Administered 2019-03-17: 5 mg via INTRAVENOUS
  Filled 2019-03-17: qty 1

## 2019-03-17 MED ORDER — LIDOCAINE VISCOUS HCL 2 % MT SOLN
15.0000 mL | Freq: Once | OROMUCOSAL | Status: AC
  Administered 2019-03-17: 15 mL via ORAL
  Filled 2019-03-17: qty 15

## 2019-03-17 MED ORDER — DICYCLOMINE HCL 10 MG PO CAPS
10.0000 mg | ORAL_CAPSULE | Freq: Once | ORAL | Status: AC
  Administered 2019-03-17: 10 mg via ORAL
  Filled 2019-03-17: qty 1

## 2019-03-17 NOTE — Discharge Instructions (Addendum)
Take Bentyl and Zofran as prescribed at your ER visit last night. Follow-up with your primary care provider.  Return to ER for new or worsening symptoms.

## 2019-03-17 NOTE — ED Notes (Signed)
Pt now nauseous after drinking drink too quickly.

## 2019-03-17 NOTE — ED Provider Notes (Signed)
[facility] EMERGENCY DEPARTMENT Provider Note   CSN: [id number] Arrival date & time: 03/17/19  1540    History   Chief Complaint Chief Complaint  Patient presents with   Abdominal Cramping    HPI [name] is a 38 y.o. male.     38yo male with report of abdominal pain with nausea and vomiting x 8 days. Described as chrons flare. States diarrhea yesterday, described as soft stool, no blood or mucous. Emesis is non bloody. Pain is diffuse, constant, worse with eating, nothing improves pain, tried Tylenol for pain at home. Admits to smoking marijuana, last use was yesterday.  No prior abdominal surgeries.  History of sickle cell anemia, does not see anyone for management, states when he feels low he comes to the hospital for a transfusion. Last blood transfusion was a few years ago. Unclear if he actually has sickle cell anemia, states this happened when he had his GI bleed.     Past Medical History:  Diagnosis Date   Crohn's disease ([place])    GI (gastrointestinal bleed) 04/2013   Multiple myeloma (HCC)    Schizophrenia (HCC)    Sickle cell anemia (HCC)    Suicide attempt by drug ingestion ([place])    Ulcerative colitis     Patient Active Problem List   Diagnosis Date Noted   Schizophrenia ([place]) 02/23/2019   Non compliance w medication regimen    Cocaine use disorder, moderate, dependence ([place]) 06/09/2016   Cannabis use disorder, severe, dependence ([place]) 06/09/2016   Cocaine dependence with cocaine-induced mood disorder ([place]) 03/19/2016   Cocaine abuse with cocaine-induced mood disorder ([place]) 03/03/2016   Cocaine abuse ([place]) 03/03/2016   Suicidal ideation    Reflux esophagitis 05/24/2013   Other specified gastritis without mention of hemorrhage 05/24/2013   Nausea with vomiting 05/23/2013   Anemia, unspecified 05/23/2013   Nonspecific abnormal finding in stool contents 05/23/2013   Paranoid schizophrenia ([place])    Ulcerative  colitis ([place])    SUBSTANCE ABUSE, MULTIPLE 12/31/2007    Past Surgical History:  Procedure Laterality Date   ESOPHAGOGASTRODUODENOSCOPY N/A 05/24/2013   Procedure: ESOPHAGOGASTRODUODENOSCOPY (EGD);  Surgeon: Ladene Artist, MD;  Location: [facility] ENDOSCOPY;  Service: Endoscopy;  Laterality: N/A;   FLEXIBLE SIGMOIDOSCOPY  12/05/2011   Procedure: FLEXIBLE SIGMOIDOSCOPY;  Surgeon: Beryle Beams, MD;  Location: [facility] ENDOSCOPY;  Service: Endoscopy;  Laterality: N/A;   MANDIBLE FRACTURE SURGERY          Home Medications    Prior to Admission medications   Medication Sig Start Date End Date Taking? Authorizing Provider  benztropine (COGENTIN) 1 MG tablet Take 1 mg by mouth at bedtime.     [provider]  dicyclomine (BENTYL) 20 MG tablet Take 1 tablet (20 mg total) by mouth 2 (two) times daily. 03/16/19   Harris, Vernie Shanks, PA-C  haloperidol (HALDOL) 10 MG tablet Take 10 mg by mouth at bedtime.  02/26/18   [provider]  haloperidol decanoate (HALDOL DECANOATE) 100 MG/ML injection Inject 100 mg into the muscle every 28 (twenty-eight) days. 02/14/18   [provider]  ondansetron (ZOFRAN) 4 MG tablet Take 1 tablet (4 mg total) by mouth every 8 (eight) hours as needed for nausea or vomiting. 03/16/19   Margarita Mail, PA-C    Family History Family History  Problem Relation Age of Onset   Liver cancer Other     Social History Social History   Tobacco Use   Smoking status: Current Some Day Smoker  Packs/day: 0.25    Years: 20.00    Pack years: 5.00    Types: Cigarettes   Smokeless tobacco: Never Used  Substance Use Topics   Alcohol use: Yes    Comment: BAC 117   Drug use: Yes    Types: Cocaine, Marijuana    Comment: +THC; +COC     Allergies   Depakote [divalproex sodium]; Lithium; Nsaids; and Risperdal [risperidone]   Review of Systems Review of Systems  Constitutional: Negative for chills, diaphoresis and fever.  Respiratory: Negative for  shortness of breath.   Cardiovascular: Negative for chest pain.  Gastrointestinal: Positive for abdominal pain, nausea and vomiting. Negative for blood in stool, constipation and diarrhea.  Genitourinary: Negative for dysuria, frequency and urgency.  Musculoskeletal: Negative for arthralgias and myalgias.  Skin: Negative for rash and wound.  Allergic/Immunologic: Negative for immunocompromised state.  Neurological: Negative for dizziness and weakness.  All other systems reviewed and are negative.    Physical Exam Updated Vital Signs BP 123/61    Pulse 80    Temp 98 F (36.7 C) (Oral)    Resp 18    Ht 6' (1.829 m)    Wt 105.7 kg    SpO2 98%    BMI 31.60 kg/m   Physical Exam Vitals signs and nursing note reviewed.  Constitutional:      General: He is not in acute distress.    Appearance: He is well-developed. He is not diaphoretic.  HENT:     Head: Normocephalic and atraumatic.  Cardiovascular:     Rate and Rhythm: Normal rate and regular rhythm.     Pulses: Normal pulses.     Heart sounds: Normal heart sounds.  Pulmonary:     Effort: Pulmonary effort is normal.     Breath sounds: Normal breath sounds.  Abdominal:     Palpations: Abdomen is soft.     Tenderness: There is no abdominal tenderness. There is no guarding.  Skin:    General: Skin is warm and dry.     Findings: No erythema or rash.  Neurological:     Mental Status: He is alert and oriented to person, place, and time.  Psychiatric:        Behavior: Behavior normal.      ED Treatments / Results  Labs (all labs ordered are listed, but only abnormal results are displayed) Labs Reviewed  CBC WITH DIFFERENTIAL/PLATELET - Abnormal; Notable for the following components:      Result Value   WBC 13.5 (*)    RDW 16.1 (*)    Neutro Abs 10.7 (*)    All other components within normal limits  COMPREHENSIVE METABOLIC PANEL - Abnormal; Notable for the following components:   Glucose, Bld 102 (*)    All other  components within normal limits  URINALYSIS, ROUTINE W REFLEX MICROSCOPIC - Abnormal; Notable for the following components:   Color, Urine COLORLESS (*)    Specific Gravity, Urine 1.002 (*)    All other components within normal limits  RETICULOCYTES - Abnormal; Notable for the following components:   Immature Retic Fract 22.8 (*)    All other components within normal limits  LIPASE, BLOOD  RAPID URINE DRUG SCREEN, HOSP PERFORMED    EKG None  Radiology Ct Abdomen Pelvis W Contrast  Result Date: 03/16/2019 CLINICAL DATA:  38 year old male with history of Crohn's presenting with abdominal pain. History of sickle cell disease. EXAM: CT ABDOMEN AND PELVIS WITH CONTRAST TECHNIQUE: Multidetector CT imaging of the abdomen and  pelvis was performed using the standard protocol following bolus administration of intravenous contrast. CONTRAST:  182m OMNIPAQUE IOHEXOL 300 MG/ML  SOLN COMPARISON:  CT of the abdomen pelvis dated 09/22/2018 FINDINGS: Lower chest: The visualized lung bases are clear. No intra-abdominal free air or free fluid. Hepatobiliary: No focal liver abnormality is seen. No gallstones, gallbladder wall thickening, or biliary dilatation. Pancreas: Unremarkable. No pancreatic ductal dilatation or surrounding inflammatory changes. Spleen: Normal in size without focal abnormality. Adrenals/Urinary Tract: The adrenal glands are unremarkable. There is no hydronephrosis on either side. There is symmetric enhancement and excretion of contrast by both kidneys. The visualized ureters and urinary bladder appear unremarkable. Stomach/Bowel: There is no bowel obstruction or active inflammation. The appendix is normal. Vascular/Lymphatic: The abdominal aorta and IVC appear unremarkable. No portal venous gas. Scattered retroperitoneal and mesenteric adenopathy similar to prior CT. Reproductive: The prostate and seminal vesicles are grossly unremarkable. No pelvic mass. Other: None Musculoskeletal: No acute  osseous pathology. Partial fusion of the superior portion of the SI joints bilaterally, likely related to inflammatory bowel disease. Old fracture of the left L4 transverse process. No acute osseous pathology. IMPRESSION: 1. No acute intra-abdominal or pelvic pathology. No bowel obstruction or active inflammation. Normal appendix. 2. Scattered retroperitoneal and mesenteric adenopathy similar to prior CT. Electronically Signed   By: AAnner CreteM.D.   On: 03/16/2019 23:28    Procedures Procedures (including critical care time)  Medications Ordered in ED Medications  sodium chloride 0.9 % bolus 1,000 mL (0 mLs Intravenous Stopped 03/17/19 1014)  haloperidol lactate (HALDOL) injection 5 mg (5 mg Intravenous Given 03/17/19 0856)  diphenhydrAMINE (BENADRYL) injection 12.5 mg (12.5 mg Intravenous Given 03/17/19 0857)  alum & mag hydroxide-simeth (MAALOX/MYLANTA) 200-200-20 MG/5ML suspension 30 mL (30 mLs Oral Given 03/17/19 1013)    And  lidocaine (XYLOCAINE) 2 % viscous mouth solution 15 mL (15 mLs Oral Given 03/17/19 1013)  dicyclomine (BENTYL) capsule 10 mg (10 mg Oral Given 03/17/19 1126)     Initial Impression / Assessment and Plan / ED Course  I have reviewed the triage vital signs and the nursing notes.  Pertinent labs & imaging results that were available during my care of the patient were reviewed by me and considered in my medical decision making (see chart for details).  Clinical Course as of Mar 16 1152  Mon [birth year], [address][birth year] [id number] old male with history of Crohn's disease presents emergency room with ongoing abdominal pain.  Patient reports pain similar to prior Crohn's flares.  Patient denies changes in bowel or bladder habits, fevers, chills.  Patient reports having nausea and vomiting.  Patient was seen emergency room last night for same, had CT scan without acute findings and unremarkable lab work, was sent home with Bentyl and Zofran which she has not started.  Patient does admit to  smoking marijuana.  On exam patient's abdomen soft and nontender.  Patient was given Haldol and Benadryl for possible cannabinoid hyperemesis.  Repeat lab work shows mild increase in white count to 13.5, possibly related to vomiting.  Urinalysis is negative for marijuana use or other drug use.  CMP unremarkable.  Repeat abdominal exam, patient's abdomen is still soft and nontender, at this time I do not feel patient needs a repeat CT scan.  Patient was given a GI cocktail, is tolerating p.o. fluids.  Patient was given dose of Bentyl and discharged to continue with Bentyl and Zofran as prescribed last night.   [LM]    Clinical Course  User Index [LM] Tacy Learn, PA-C      Final Clinical Impressions(s) / ED Diagnoses   Final diagnoses:  Generalized abdominal pain  Non-intractable vomiting with nausea, unspecified vomiting type    ED Discharge Orders    None       Tacy Learn, PA-C 03/17/19 1153    Charlesetta Shanks, MD 03/24/19 1442

## 2019-03-17 NOTE — ED Notes (Signed)
Pt given coke to drink

## 2019-03-17 NOTE — ED Triage Notes (Signed)
Patient reports generalized abd pain, n/v/d x8 days. Reports hx of crohns/UC, states he was here earlier in the night but symptoms are no better.

## 2019-03-19 ENCOUNTER — Other Ambulatory Visit: Payer: Self-pay

## 2019-03-19 ENCOUNTER — Emergency Department (HOSPITAL_COMMUNITY)
Admission: EM | Admit: 2019-03-19 | Discharge: 2019-03-19 | Disposition: A | Discharge status: Home / Self Care | Payer: Medicare Other | Attending: Emergency Medicine | Admitting: Emergency Medicine

## 2019-03-19 DIAGNOSIS — R197 Diarrhea, unspecified: Secondary | ICD-10-CM | POA: Insufficient documentation

## 2019-03-19 DIAGNOSIS — F17210 Nicotine dependence, cigarettes, uncomplicated: Secondary | ICD-10-CM | POA: Diagnosis not present

## 2019-03-19 DIAGNOSIS — K5090 Crohn's disease, unspecified, without complications: Secondary | ICD-10-CM | POA: Insufficient documentation

## 2019-03-19 DIAGNOSIS — Z79899 Other long term (current) drug therapy: Secondary | ICD-10-CM | POA: Diagnosis not present

## 2019-03-19 DIAGNOSIS — R1084 Generalized abdominal pain: Secondary | ICD-10-CM | POA: Diagnosis not present

## 2019-03-19 DIAGNOSIS — R112 Nausea with vomiting, unspecified: Secondary | ICD-10-CM | POA: Insufficient documentation

## 2019-03-19 LAB — CBC
HCT: 43.5 % (ref 39.0–52.0)
Hemoglobin: 14 g/dL (ref 13.0–17.0)
MCH: 26.5 pg (ref 26.0–34.0)
MCHC: 32.2 g/dL (ref 30.0–36.0)
MCV: 82.4 fL (ref 80.0–100.0)
Platelets: 426 10*3/uL — ABNORMAL HIGH (ref 150–400)
RBC: 5.28 MIL/uL (ref 4.22–5.81)
RDW: 16.1 % — ABNORMAL HIGH (ref 11.5–15.5)
WBC: 11.7 10*3/uL — ABNORMAL HIGH (ref 4.0–10.5)
nRBC: 0 % (ref 0.0–0.2)

## 2019-03-19 LAB — COMPREHENSIVE METABOLIC PANEL
ALT: 22 U/L (ref 0–44)
AST: 21 U/L (ref 15–41)
Albumin: 4.1 g/dL (ref 3.5–5.0)
Alkaline Phosphatase: 88 U/L (ref 38–126)
Anion gap: 10 (ref 5–15)
BUN: 9 mg/dL (ref 6–20)
CO2: 25 mmol/L (ref 22–32)
Calcium: 9 mg/dL (ref 8.9–10.3)
Chloride: 100 mmol/L (ref 98–111)
Creatinine, Ser: 0.97 mg/dL (ref 0.61–1.24)
GFR calc Af Amer: >60 mL/min (ref >60)
GFR calc non Af Amer: >60 mL/min (ref >60)
Glucose, Bld: 142 mg/dL — ABNORMAL HIGH (ref 70–99)
Potassium: 3.9 mmol/L (ref 3.5–5.1)
Sodium: 135 mmol/L (ref 135–145)
Total Bilirubin: 0.4 mg/dL (ref 0.3–1.2)
Total Protein: 7.9 g/dL (ref 6.5–8.1)

## 2019-03-19 LAB — URINALYSIS, ROUTINE W REFLEX MICROSCOPIC
Bacteria, UA: NONE SEEN
Bilirubin Urine: NEGATIVE
Glucose, UA: NEGATIVE mg/dL
Hgb urine dipstick: NEGATIVE
Ketones, ur: NEGATIVE mg/dL
Leukocytes,Ua: NEGATIVE
Nitrite: NEGATIVE
Protein, ur: 30 mg/dL — AB
Specific Gravity, Urine: 1.02 (ref 1.005–1.030)
pH: 8 (ref 5.0–8.0)

## 2019-03-19 LAB — LIPASE, BLOOD: Lipase: 20 U/L (ref 11–51)

## 2019-03-19 MED ORDER — OXYCODONE-ACETAMINOPHEN 5-325 MG PO TABS
1.0000 | ORAL_TABLET | Freq: Once | ORAL | Status: AC
  Administered 2019-03-19: 1 via ORAL
  Filled 2019-03-19: qty 1

## 2019-03-19 MED ORDER — HALOPERIDOL LACTATE 5 MG/ML IJ SOLN
5.0000 mg | Freq: Once | INTRAMUSCULAR | Status: AC
  Administered 2019-03-19: 5 mg via INTRAVENOUS
  Filled 2019-03-19: qty 1

## 2019-03-19 MED ORDER — ONDANSETRON HCL 4 MG/2ML IJ SOLN
4.0000 mg | Freq: Once | INTRAMUSCULAR | Status: AC
  Administered 2019-03-19: 4 mg via INTRAVENOUS
  Filled 2019-03-19: qty 2

## 2019-03-19 MED ORDER — ALUM & MAG HYDROXIDE-SIMETH 200-200-20 MG/5ML PO SUSP
30.0000 mL | Freq: Once | ORAL | Status: AC
  Administered 2019-03-19: 30 mL via ORAL
  Filled 2019-03-19: qty 30

## 2019-03-19 MED ORDER — DICYCLOMINE HCL 10 MG/5ML PO SOLN
10.0000 mg | Freq: Once | ORAL | Status: AC
  Administered 2019-03-19: 10 mg via ORAL
  Filled 2019-03-19: qty 5

## 2019-03-19 MED ORDER — ONDANSETRON 4 MG PO TBDP
4.0000 mg | ORAL_TABLET | Freq: Once | ORAL | Status: AC
  Administered 2019-03-19: 4 mg via ORAL
  Filled 2019-03-19: qty 1

## 2019-03-19 MED ORDER — PROMETHAZINE HCL 25 MG RE SUPP: 25.0000 mg | Freq: Four times a day (QID) | RECTAL | 0 refills | Status: DC | PRN

## 2019-03-19 MED ORDER — SODIUM CHLORIDE 0.9 % IV BOLUS
1000.0000 mL | Freq: Once | INTRAVENOUS | Status: AC
  Administered 2019-03-19: 1000 mL via INTRAVENOUS

## 2019-03-19 MED ORDER — LIDOCAINE VISCOUS HCL 2 % MT SOLN
15.0000 mL | Freq: Once | OROMUCOSAL | Status: AC
  Administered 2019-03-19: 15 mL via ORAL
  Filled 2019-03-19: qty 15

## 2019-03-19 MED ORDER — DROPERIDOL 2.5 MG/ML IJ SOLN
1.2500 mg | Freq: Once | INTRAMUSCULAR | Status: AC
  Administered 2019-03-19: 1.25 mg via INTRAVENOUS
  Filled 2019-03-19: qty 2

## 2019-03-19 NOTE — ED Notes (Signed)
Pt had c/o return of diffuse abd pain and requested pain meds.  When attmpted to give GI cocktail pt currently having emesis. MD aware

## 2019-03-19 NOTE — ED Notes (Signed)
Pt has vomited x3

## 2019-03-19 NOTE — ED Notes (Signed)
IV attempted with no success.

## 2019-03-19 NOTE — Discharge Instructions (Signed)
Continue zofran and bentyl.  I have sent you a prescription for phenergan suppositories for back up if needed. Gentle diet, lots of fluids.  Advance diet as tolerated. Follow-up with your GI doctor. Return to the ED for new or worsening symptoms.

## 2019-03-19 NOTE — ED Notes (Signed)
Pt continues to c/o pain and requests Morphine for pain.  Provider notified and would like to try small sips of ginger ale.  Pt agreeable and instructions given.

## 2019-03-19 NOTE — ED Notes (Signed)
Discussed with pt he is ready for dc and he quickly gets ready to go.  Will give PO gi cocktail and DC.

## 2019-03-19 NOTE — ED Triage Notes (Addendum)
Pt has multiple complaints, n/v/d accompanied by dizziness.  Seen here two days ago for same.

## 2019-03-19 NOTE — ED Provider Notes (Signed)
[facility] EMERGENCY DEPARTMENT Provider Note   CSN: [id number] Arrival date & time: 03/19/19  0211    History   Chief Complaint Chief Complaint  Patient presents with  . Emesis    HPI [name] is a 38 y.o. male.     The history is provided by the patient and medical records.  Emesis  Associated symptoms: abdominal pain and diarrhea     38 y.o. M with hx of crohn's disease, hx of GI bleeding, MM, schizophrenia, ulcerative colitis, crohn's disease, presenting to the ED for abdominal pain.  Patient seen on 5/31 and 6/1 for same.  Has had had laboratory studies x2 as well as CT scan that was normal.  States he has continued diffuse abdominal pain with nausea, vomiting, and loose stools.  He denies any melena or hematochezia.  States he is unable to tolerate anything by mouth.  He denies any fever or sick contacts.  No urinary symptoms.  States he did try taking the Zofran and Bentyl but has not had any relief.  He has not attempted to follow-up with PCP and/or GI doctor.  Past Medical History:  Diagnosis Date  . Crohn's disease ([place])   . GI (gastrointestinal bleed) 04/2013  . Multiple myeloma ([place])   . Schizophrenia ([place])   . Sickle cell anemia (HCC)   . Suicide attempt by drug ingestion ([place])   . Ulcerative colitis     Patient Active Problem List   Diagnosis Date Noted  . Schizophrenia ([place]) 02/23/2019  . Non compliance w medication regimen   . Cocaine use disorder, moderate, dependence ([place]) 06/09/2016  . Cannabis use disorder, severe, dependence ([place]) 06/09/2016  . Cocaine dependence with cocaine-induced mood disorder ([place]) 03/19/2016  . Cocaine abuse with cocaine-induced mood disorder ([place]) 03/03/2016  . Cocaine abuse ([place]) 03/03/2016  . Suicidal ideation   . Reflux esophagitis 05/24/2013  . Other specified gastritis without mention of hemorrhage 05/24/2013  . Nausea with vomiting 05/23/2013  . Anemia, unspecified 05/23/2013  . Nonspecific  abnormal finding in stool contents 05/23/2013  . Paranoid schizophrenia ([place])   . Ulcerative colitis ([place])   . SUBSTANCE ABUSE, MULTIPLE 12/31/2007    Past Surgical History:  Procedure Laterality Date  . ESOPHAGOGASTRODUODENOSCOPY N/A 05/24/2013   Procedure: ESOPHAGOGASTRODUODENOSCOPY (EGD);  Surgeon: Ladene Artist, MD;  Location[facility] ENDOSCOPY;  Service: Endoscopy;  Laterality: N/A;  . FLEXIBLE SIGMOIDOSCOPY  12/05/2011   Procedure: FLEXIBLE SIGMOIDOSCOPY;  Surgeon: Beryle Beams, MD;  Location: [facility] ENDOSCOPY;  Service: Endoscopy;  Laterality: N/A;  . MANDIBLE FRACTURE SURGERY          Home Medications    Prior to Admission medications   Medication Sig Start Date End Date Taking? Authorizing Provider  benztropine (COGENTIN) 1 MG tablet Take 1 mg by mouth at bedtime.     [provider]  dicyclomine (BENTYL) 20 MG tablet Take 1 tablet (20 mg total) by mouth 2 (two) times daily. 03/16/19   Harris, Vernie Shanks, PA-C  haloperidol (HALDOL) 10 MG tablet Take 10 mg by mouth at bedtime.  02/26/18   [provider]  haloperidol decanoate (HALDOL DECANOATE) 100 MG/ML injection Inject 100 mg into the muscle every 28 (twenty-eight) days. 02/14/18   [provider]  ondansetron (ZOFRAN) 4 MG tablet Take 1 tablet (4 mg total) by mouth every 8 (eight) hours as needed for nausea or vomiting. 03/16/19   Margarita Mail, PA-C    Family History Family History  Problem Relation Age  of Onset  . Liver cancer Other     Social History Social History   Tobacco Use  . Smoking status: Current Some Day Smoker    Packs/day: 0.25    Years: 20.00    Pack years: 5.00    Types: Cigarettes  . Smokeless tobacco: Never Used  Substance Use Topics  . Alcohol use: Yes    Comment: BAC 117  . Drug use: Yes    Types: Cocaine, Marijuana    Comment: +THC; +COC     Allergies   Depakote [divalproex sodium]; Lithium; Nsaids; and Risperdal [risperidone]   Review of Systems Review of  Systems  Gastrointestinal: Positive for abdominal pain, diarrhea, nausea and vomiting.  All other systems reviewed and are negative.    Physical Exam Updated Vital Signs BP (!) 158/96 (BP Location: Right Arm)   Pulse 81   Temp 98.2 F (36.8 C) (Oral)   Resp 20   SpO2 99%   Physical Exam Vitals signs and nursing note reviewed.  Constitutional:      Appearance: He is well-developed.  HENT:     Head: Normocephalic and atraumatic.     Mouth/Throat:     Mouth: Mucous membranes are dry.  Eyes:     Conjunctiva/sclera: Conjunctivae normal.     Pupils: Pupils are equal, round, and reactive to light.  Neck:     Musculoskeletal: Normal range of motion.  Cardiovascular:     Rate and Rhythm: Normal rate and regular rhythm.     Heart sounds: Normal heart sounds.  Pulmonary:     Effort: Pulmonary effort is normal.     Breath sounds: Normal breath sounds.  Abdominal:     General: Bowel sounds are normal.     Palpations: Abdomen is soft.     Comments: Abdomen is obese but is soft and nontender to palpation, bowel sounds are normal  Musculoskeletal: Normal range of motion.  Skin:    General: Skin is warm and dry.  Neurological:     Mental Status: He is alert and oriented to person, place, and time.  Psychiatric:     Comments: Odd affect      ED Treatments / Results  Labs (all labs ordered are listed, but only abnormal results are displayed) Labs Reviewed  COMPREHENSIVE METABOLIC PANEL - Abnormal; Notable for the following components:      Result Value   Glucose, Bld 142 (*)    All other components within normal limits  CBC - Abnormal; Notable for the following components:   WBC 11.7 (*)    RDW 16.1 (*)    Platelets 426 (*)    All other components within normal limits  URINALYSIS, ROUTINE W REFLEX MICROSCOPIC - Abnormal; Notable for the following components:   Protein, ur 30 (*)    All other components within normal limits  LIPASE, BLOOD    EKG None  Radiology No  results found.  Procedures Procedures (including critical care time)  Medications Ordered in ED Medications  ondansetron (ZOFRAN-ODT) disintegrating tablet 4 mg (4 mg Oral Given 03/19/19 0444)  oxyCODONE-acetaminophen (PERCOCET/ROXICET) 5-325 MG per tablet 1 tablet (1 tablet Oral Given 03/19/19 0444)  haloperidol lactate (HALDOL) injection 5 mg (5 mg Intravenous Given 03/19/19 0556)  ondansetron (ZOFRAN) injection 4 mg (4 mg Intravenous Given 03/19/19 0556)  sodium chloride 0.9 % bolus 1,000 mL (1,000 mLs Intravenous New Bag/Given 03/19/19 0556)     Initial Impression / Assessment and Plan / ED Course  I have reviewed the triage  vital signs and the nursing notes.  Pertinent labs & imaging results that were available during my care of the patient were reviewed by me and considered in my medical decision making (see chart for details).  38 year old male presenting to the ED with generalized abdominal pain, nausea, vomiting, and diarrhea.  He was seen for same on 5/31 and 6/1 with overall reassuring lab work x2 and normal CT scan.  He reports continued pain and vomiting.  In room he is lying down in no acute distress, talking on the phone.  Emesis bag is on the floor and appears empty has had from tissues.  States he threw up just prior to coming back to room, this was not witnessed.  He is afebrile and nontoxic in appearance.  He reports generalized abdominal pain but his abdomen is soft and without any focal tenderness or peritoneal signs.  His labs are reviewed and are overall reassuring.  Will attempt medications for symptomatic control.  4:52 AM Patient apparently vomited up meds, however RN states this sounded very forceful so unsure if self induced.  Will place IV and give haldol, zofran, fluids as this combo worked well for him previously.  History of marijuana and cocaine use so possible component of cannabinoid hyperemesis.  UDS from 2 days ago was negative for both.  RN unable to establish  access, veins seem collapsed.  IV team consult.  6:49 AM Patient sleeping soundly.  VSS.  No further emesis.  Has about another 73cc of IVF to infuse as IV is small and somewhat tenuous.  Feel he will be stable for discharge once IVF finish.  Can continue zofran and bentyl he was prescribed previously, I have also written him a script for phenergan suppositories as back up if needed.  He will need to follow-up with his GI doctor.  Can return here for any new/acute changes.  Final Clinical Impressions(s) / ED Diagnoses   Final diagnoses:  Nausea vomiting and diarrhea    ED Discharge Orders         Ordered    promethazine (PHENERGAN) 25 MG suppository  Every 6 hours PRN     03/19/19 0651           Larene Pickett, PA-C 02/40/97 3532    Delora Fuel, MD 99/24/26 [phone]

## 2019-04-07 ENCOUNTER — Encounter (HOSPITAL_COMMUNITY): Payer: Self-pay | Admitting: Emergency Medicine

## 2019-04-07 ENCOUNTER — Encounter (HOSPITAL_COMMUNITY): Payer: Self-pay | Admitting: Family Medicine

## 2019-04-07 ENCOUNTER — Ambulatory Visit (INDEPENDENT_AMBULATORY_CARE_PROVIDER_SITE_OTHER)
Admission: EM | Admit: 2019-04-07 | Discharge: 2019-04-07 | Disposition: A | Discharge status: Home / Self Care | Payer: Medicare Other | Source: Home / Self Care

## 2019-04-07 ENCOUNTER — Emergency Department (HOSPITAL_COMMUNITY): Payer: Medicare Other

## 2019-04-07 ENCOUNTER — Other Ambulatory Visit: Payer: Self-pay

## 2019-04-07 ENCOUNTER — Emergency Department (HOSPITAL_COMMUNITY)
Admission: EM | Admit: 2019-04-07 | Discharge: 2019-04-07 | Disposition: A | Discharge status: Home / Self Care | Payer: Medicare Other | Attending: Emergency Medicine | Admitting: Emergency Medicine

## 2019-04-07 DIAGNOSIS — J36 Peritonsillar abscess: Secondary | ICD-10-CM

## 2019-04-07 DIAGNOSIS — J020 Streptococcal pharyngitis: Secondary | ICD-10-CM | POA: Diagnosis not present

## 2019-04-07 DIAGNOSIS — Z8579 Personal history of other malignant neoplasms of lymphoid, hematopoietic and related tissues: Secondary | ICD-10-CM | POA: Diagnosis not present

## 2019-04-07 DIAGNOSIS — J029 Acute pharyngitis, unspecified: Secondary | ICD-10-CM | POA: Diagnosis present

## 2019-04-07 DIAGNOSIS — Z79899 Other long term (current) drug therapy: Secondary | ICD-10-CM | POA: Insufficient documentation

## 2019-04-07 DIAGNOSIS — F17210 Nicotine dependence, cigarettes, uncomplicated: Secondary | ICD-10-CM | POA: Diagnosis not present

## 2019-04-07 DIAGNOSIS — Z20828 Contact with and (suspected) exposure to other viral communicable diseases: Secondary | ICD-10-CM | POA: Diagnosis not present

## 2019-04-07 LAB — BASIC METABOLIC PANEL
Anion gap: 10 (ref 5–15)
BUN: 10 mg/dL (ref 6–20)
CO2: 25 mmol/L (ref 22–32)
Calcium: 9.4 mg/dL (ref 8.9–10.3)
Chloride: 99 mmol/L (ref 98–111)
Creatinine, Ser: 1.05 mg/dL (ref 0.61–1.24)
GFR calc Af Amer: >60 mL/min (ref >60)
GFR calc non Af Amer: >60 mL/min (ref >60)
Glucose, Bld: 118 mg/dL — ABNORMAL HIGH (ref 70–99)
Potassium: 4.1 mmol/L (ref 3.5–5.1)
Sodium: 134 mmol/L — ABNORMAL LOW (ref 135–145)

## 2019-04-07 LAB — CBC WITH DIFFERENTIAL/PLATELET
Abs Immature Granulocytes: 0.16 10*3/uL — ABNORMAL HIGH (ref 0.00–0.07)
Basophils Absolute: 0.1 10*3/uL (ref 0.0–0.1)
Basophils Relative: 1 %
Eosinophils Absolute: 0.6 10*3/uL — ABNORMAL HIGH (ref 0.0–0.5)
Eosinophils Relative: 2 %
HCT: 45.7 % (ref 39.0–52.0)
Hemoglobin: 14.8 g/dL (ref 13.0–17.0)
Immature Granulocytes: 1 %
Lymphocytes Relative: 8 %
Lymphs Abs: 1.9 10*3/uL (ref 0.7–4.0)
MCH: 26.8 pg (ref 26.0–34.0)
MCHC: 32.4 g/dL (ref 30.0–36.0)
MCV: 82.6 fL (ref 80.0–100.0)
Monocytes Absolute: 2.2 10*3/uL — ABNORMAL HIGH (ref 0.1–1.0)
Monocytes Relative: 9 %
Neutro Abs: 19.1 10*3/uL — ABNORMAL HIGH (ref 1.7–7.7)
Neutrophils Relative %: 79 %
Platelets: 409 10*3/uL — ABNORMAL HIGH (ref 150–400)
RBC: 5.53 MIL/uL (ref 4.22–5.81)
RDW: 15.9 % — ABNORMAL HIGH (ref 11.5–15.5)
WBC: 24.1 10*3/uL — ABNORMAL HIGH (ref 4.0–10.5)
nRBC: 0 % (ref 0.0–0.2)

## 2019-04-07 LAB — POCT RAPID STREP A: Streptococcus, Group A Screen (Direct): POSITIVE — AB

## 2019-04-07 LAB — SARS CORONAVIRUS 2 BY RT PCR (HOSPITAL ORDER, PERFORMED IN ~~LOC~~ HOSPITAL LAB): SARS Coronavirus 2: NEGATIVE

## 2019-04-07 MED ORDER — LIDOCAINE-EPINEPHRINE (PF) 2 %-1:200000 IJ SOLN
10.0000 mL | Freq: Once | INTRAMUSCULAR | Status: AC
  Administered 2019-04-07: 10 mL
  Filled 2019-04-07: qty 20

## 2019-04-07 MED ORDER — SODIUM CHLORIDE 0.9 % IV BOLUS
1000.0000 mL | Freq: Once | INTRAVENOUS | Status: AC
  Administered 2019-04-07: 1000 mL via INTRAVENOUS

## 2019-04-07 MED ORDER — AMOXICILLIN-POT CLAVULANATE 875-125 MG PO TABS: 1.0000 | ORAL_TABLET | Freq: Two times a day (BID) | ORAL | 0 refills | Status: DC

## 2019-04-07 MED ORDER — IOHEXOL 300 MG/ML  SOLN
75.0000 mL | Freq: Once | INTRAMUSCULAR | Status: AC | PRN
  Administered 2019-04-07: 75 mL via INTRAVENOUS

## 2019-04-07 MED ORDER — LIDOCAINE-EPINEPHRINE 1 %-1:100000 IJ SOLN
10.0000 mL | Freq: Once | INTRAMUSCULAR | Status: DC
  Filled 2019-04-07 (×2): qty 10

## 2019-04-07 MED ORDER — METHYLPREDNISOLONE 4 MG PO TBPK: ORAL_TABLET | ORAL | 0 refills | Status: DC

## 2019-04-07 MED ORDER — DEXAMETHASONE SODIUM PHOSPHATE 4 MG/ML IJ SOLN
8.0000 mg | Freq: Once | INTRAMUSCULAR | Status: AC
  Administered 2019-04-07: 13:00:00 8 mg via INTRAVENOUS
  Filled 2019-04-07: qty 2

## 2019-04-07 MED ORDER — BENZOCAINE 20 % MT AERO
INHALATION_SPRAY | Freq: Once | OROMUCOSAL | Status: DC
  Filled 2019-04-07: qty 57

## 2019-04-07 MED ORDER — SODIUM CHLORIDE 0.9 % IV SOLN
3.0000 g | Freq: Once | INTRAVENOUS | Status: AC
  Administered 2019-04-07: 13:00:00 3 g via INTRAVENOUS
  Filled 2019-04-07: qty 3

## 2019-04-07 NOTE — Discharge Instructions (Addendum)
Please go to the ER for further evaluation of your peritonsillar abscess.

## 2019-04-07 NOTE — ED Provider Notes (Signed)
[facility] EMERGENCY DEPARTMENT Provider Note   CSN: [id number] Arrival date & time: 04/07/19  0177     History   Chief Complaint Chief Complaint  Patient presents with  . Cough  . Sore Throat  . Abscess    HPI [name] is a 38 y.o. male.     38 year old male with prior medical history as detailed below presents for evaluation of possible peritonsillar abscess.  Patient presented to the urgent care earlier today.  He complained of 2 days of sore throat.  Patient is strep positive.  He was sent to the ED for further evaluation of possible right-sided PTA.  Patient denies fever.  He reports increasing pain over the last 48 hours.  He has not taken anything for his symptoms.  He denies prior history of peritonsillar abscess.  The history is provided by the patient and medical records.  Sore Throat This is a new problem. The current episode started 2 days ago. The problem occurs constantly. The problem has not changed since onset.Pertinent negatives include no chest pain and no abdominal pain. Nothing aggravates the symptoms. Nothing relieves the symptoms.    Past Medical History:  Diagnosis Date  . Crohn's disease ([place])   . GI (gastrointestinal bleed) 04/2013  . Multiple myeloma ([place])   . Schizophrenia ([place])   . Sickle cell anemia (HCC)   . Suicide attempt by drug ingestion ([place])   . Ulcerative colitis     Patient Active Problem List   Diagnosis Date Noted  . Schizophrenia ([place]) 02/23/2019  . Non compliance w medication regimen   . Cocaine use disorder, moderate, dependence ([place]) 06/09/2016  . Cannabis use disorder, severe, dependence ([place]) 06/09/2016  . Cocaine dependence with cocaine-induced mood disorder ([place]) 03/19/2016  . Cocaine abuse with cocaine-induced mood disorder ([place]) 03/03/2016  . Cocaine abuse ([place]) 03/03/2016  . Suicidal ideation   . Reflux esophagitis 05/24/2013  . Other specified gastritis without mention of hemorrhage  05/24/2013  . Nausea with vomiting 05/23/2013  . Anemia, unspecified 05/23/2013  . Nonspecific abnormal finding in stool contents 05/23/2013  . Paranoid schizophrenia ([place])   . Ulcerative colitis ([place])   . SUBSTANCE ABUSE, MULTIPLE 12/31/2007    Past Surgical History:  Procedure Laterality Date  . ESOPHAGOGASTRODUODENOSCOPY N/A 05/24/2013   Procedure: ESOPHAGOGASTRODUODENOSCOPY (EGD);  Surgeon: Ladene Artist, MD;  Location: [facility] ENDOSCOPY;  Service: Endoscopy;  Laterality: N/A;  . FLEXIBLE SIGMOIDOSCOPY  12/05/2011   Procedure: FLEXIBLE SIGMOIDOSCOPY;  Surgeon: Beryle Beams, MD;  Location: [facility] ENDOSCOPY;  Service: Endoscopy;  Laterality: N/A;  . MANDIBLE FRACTURE SURGERY          Home Medications    Prior to Admission medications   Medication Sig Start Date End Date Taking? Authorizing Provider  benztropine (COGENTIN) 1 MG tablet Take 1 mg by mouth at bedtime.     [provider]  dicyclomine (BENTYL) 20 MG tablet Take 1 tablet (20 mg total) by mouth 2 (two) times daily. 03/16/19   Harris, Vernie Shanks, PA-C  haloperidol (HALDOL) 10 MG tablet Take 10 mg by mouth at bedtime.  02/26/18   [provider]  haloperidol decanoate (HALDOL DECANOATE) 100 MG/ML injection Inject 100 mg into the muscle every 28 (twenty-eight) days. 02/14/18   [provider]  ondansetron (ZOFRAN) 4 MG tablet Take 1 tablet (4 mg total) by mouth every 8 (eight) hours as needed for nausea or vomiting. 03/16/19   Margarita Mail, PA-C  promethazine (PHENERGAN) 25 MG suppository  Place 1 suppository (25 mg total) rectally every 6 (six) hours as needed for nausea or vomiting. 03/19/19   Larene Pickett, PA-C    Family History Family History  Problem Relation Age of Onset  . Liver cancer Other     Social History Social History   Tobacco Use  . Smoking status: Current Some Day Smoker    Packs/day: 0.25    Years: 20.00    Pack years: 5.00    Types: Cigarettes  . Smokeless tobacco: Never Used   Substance Use Topics  . Alcohol use: Yes    Comment: occasional  . Drug use: Yes    Types: Cocaine, Marijuana    Comment: +THC; +COC per chart     Allergies   Depakote [divalproex sodium], Lithium, Nsaids, and Risperdal [risperidone]   Review of Systems Review of Systems  Cardiovascular: Negative for chest pain.  Gastrointestinal: Negative for abdominal pain.  All other systems reviewed and are negative.    Physical Exam Updated Vital Signs BP (!) 131/92 (BP Location: Left Arm)   Pulse (!) 134   Temp 99.6 F (37.6 C) (Oral)   Resp 18   SpO2 99%   Physical Exam Vitals signs and nursing note reviewed.  Constitutional:      General: He is not in acute distress.    Appearance: He is well-developed.  HENT:     Head: Normocephalic and atraumatic.     Mouth/Throat:     Tonsils: Tonsillar abscess present.     Comments: Notable erythema to the right tonsillar pillar with moderate edema.  Exam is concerning for possible right-sided PTA.   Normal phonation.  Patient is swallowing his own secretions. Eyes:     Conjunctiva/sclera: Conjunctivae normal.     Pupils: Pupils are equal, round, and reactive to light.  Neck:     Musculoskeletal: Normal range of motion and neck supple.  Cardiovascular:     Rate and Rhythm: Normal rate and regular rhythm.     Heart sounds: Normal heart sounds.  Pulmonary:     Effort: Pulmonary effort is normal. No respiratory distress.     Breath sounds: Normal breath sounds.  Abdominal:     General: There is no distension.     Palpations: Abdomen is soft.     Tenderness: There is no abdominal tenderness.  Musculoskeletal: Normal range of motion.        General: No deformity.  Skin:    General: Skin is warm and dry.  Neurological:     Mental Status: He is alert and oriented to person, place, and time.      ED Treatments / Results  Labs (all labs ordered are listed, but only abnormal results are displayed) Labs Reviewed  SARS  CORONAVIRUS 2 (HOSPITAL ORDER, [place] [facility])  BASIC METABOLIC PANEL  CBC WITH DIFFERENTIAL/PLATELET    EKG    Radiology No results found.  Procedures Procedures (including critical care time)  Medications Ordered in ED Medications  dexamethasone (DECADRON) injection 8 mg (has no administration in time range)  Ampicillin-Sulbactam (UNASYN) 3 g in sodium chloride 0.9 % 100 mL IVPB (has no administration in time range)  sodium chloride 0.9 % bolus 1,000 mL (has no administration in time range)     Initial Impression / Assessment and Plan / ED Course  I have reviewed the triage vital signs and the nursing notes.  Pertinent labs & imaging results that were available during my care of the patient were  reviewed by me and considered in my medical decision making (see chart for details).        MDM  Screen complete  LEONID MANUS was evaluated in Emergency Department on 04/07/2019 for the symptoms described in the history of present illness. He was evaluated in the context of the global COVID-19 pandemic, which necessitated consideration that the patient might be at risk for infection with the SARS-CoV-2 virus that causes COVID-19. Institutional protocols and algorithms that pertain to the evaluation of patients at risk for COVID-19 are in a state of rapid change based on information released by regulatory bodies including the CDC and federal and state organizations. These policies and algorithms were followed during the patient's care in the ED.  Patient is presenting with presentation consistent with right-sided peritonsillar abscess.  This is confirmed with CT.  ENT is aware of case.  They will evaluate the patient in the ED.  Dr. Regenia Skeeter is aware of ENT evaluation and disposition.    Final Clinical Impressions(s) / ED Diagnoses   Final diagnoses:  Peritonsillar abscess    ED Discharge Orders         Ordered    amoxicillin-clavulanate  (AUGMENTIN) 875-125 MG tablet  Every 12 hours     04/07/19 1537           Valarie Merino, MD 04/07/19 1539

## 2019-04-07 NOTE — ED Triage Notes (Signed)
Pt. From UC for further evaluation of a peritonsilar  abscess

## 2019-04-07 NOTE — Discharge Instructions (Signed)
RINSE INSTRUCTIONS  Irrigate your mouth with the following mixture 3 times daily for the next 5 days:  1/2 cup of warm water  1 teaspoon of salt  1/2 teaspoon of honey  Eat a soft diet for the next few days. No hard/crunchy foods or foods with sharp edges.  Your pain should continue to improve over the next couple of days.  Take tylenol and/or ibuprofen as needed for pain. Follow the package directions.  Take a probiotic, such as yogurt with active cultures, while taking your antibiotic. Take your antibiotic until the treatment is complete. Do not stop early.   Return to the ER if you experience any difficulty breathing or a significant bleeding event. Call the clinic if you have other concerns.

## 2019-04-07 NOTE — ED Triage Notes (Signed)
C/O sore throat, dry cough, right neck swelling and voice changes x 2 days without fever.  Pt with muffled voice.  Has not taken any antipyretics today.

## 2019-04-07 NOTE — ED Notes (Signed)
Pt in ct 

## 2019-04-07 NOTE — ED Provider Notes (Signed)
[facility]    CSN: [id number] Arrival date & time: 04/07/19  0177     History   Chief Complaint Chief Complaint  Patient presents with  . Cough  . Sore Throat    HPI [name] is a 38 y.o. male.   Patient is a 38 year old male with past medical history of Crohn's disease, multiple myeloma, schizophrenia, sickle cell, UC, cocaine abuse.  He presents today with approximately 2 days of worsening sore throat, right neck swelling, muffled voice.  Unable to swallow well. Lymphadenopathy. Has not taken anything for symptoms today.   ROS per HPI      Past Medical History:  Diagnosis Date  . Crohn's disease ([place])   . GI (gastrointestinal bleed) 04/2013  . Multiple myeloma ([place])   . Schizophrenia ([place])   . Sickle cell anemia (HCC)   . Suicide attempt by drug ingestion ([place])   . Ulcerative colitis     Patient Active Problem List   Diagnosis Date Noted  . Schizophrenia ([place]) 02/23/2019  . Non compliance w medication regimen   . Cocaine use disorder, moderate, dependence ([place]) 06/09/2016  . Cannabis use disorder, severe, dependence ([place]) 06/09/2016  . Cocaine dependence with cocaine-induced mood disorder ([place]) 03/19/2016  . Cocaine abuse with cocaine-induced mood disorder ([place]) 03/03/2016  . Cocaine abuse ([place]) 03/03/2016  . Suicidal ideation   . Reflux esophagitis 05/24/2013  . Other specified gastritis without mention of hemorrhage 05/24/2013  . Nausea with vomiting 05/23/2013  . Anemia, unspecified 05/23/2013  . Nonspecific abnormal finding in stool contents 05/23/2013  . Paranoid schizophrenia ([place])   . Ulcerative colitis ([place])   . SUBSTANCE ABUSE, MULTIPLE 12/31/2007    Past Surgical History:  Procedure Laterality Date  . ESOPHAGOGASTRODUODENOSCOPY N/A 05/24/2013   Procedure: ESOPHAGOGASTRODUODENOSCOPY (EGD);  Surgeon: Ladene Artist, MD;  Location: [facility] ENDOSCOPY;  Service: Endoscopy;  Laterality: N/A;  . FLEXIBLE SIGMOIDOSCOPY  12/05/2011   Procedure: FLEXIBLE SIGMOIDOSCOPY;  Surgeon: Beryle Beams, MD;  Location: [facility] ENDOSCOPY;  Service: Endoscopy;  Laterality: N/A;  . MANDIBLE FRACTURE SURGERY         Home Medications    Prior to Admission medications   Medication Sig Start Date End Date Taking? Authorizing Provider  benztropine (COGENTIN) 1 MG tablet Take 1 mg by mouth at bedtime.    Yes [provider]  haloperidol (HALDOL) 10 MG tablet Take 10 mg by mouth at bedtime.  02/26/18  Yes [provider]  haloperidol decanoate (HALDOL DECANOATE) 100 MG/ML injection Inject 100 mg into the muscle every 28 (twenty-eight) days. 02/14/18  Yes [provider]  dicyclomine (BENTYL) 20 MG tablet Take 1 tablet (20 mg total) by mouth 2 (two) times daily. 03/16/19   Harris, Abigail, PA-C  ondansetron (ZOFRAN) 4 MG tablet Take 1 tablet (4 mg total) by mouth every 8 (eight) hours as needed for nausea or vomiting. 03/16/19   Margarita Mail, PA-C  promethazine (PHENERGAN) 25 MG suppository Place 1 suppository (25 mg total) rectally every 6 (six) hours as needed for nausea or vomiting. 03/19/19   Larene Pickett, PA-C    Family History Family History  Problem Relation Age of Onset  . Liver cancer Other     Social History Social History   Tobacco Use  . Smoking status: Current Some Day Smoker    Packs/day: 0.25    Years: 20.00    Pack years: 5.00    Types: Cigarettes  . Smokeless tobacco: Never Used  Substance Use  Topics  . Alcohol use: Yes    Comment: occasional  . Drug use: Yes    Types: Cocaine, Marijuana    Comment: +THC; +COC per chart     Allergies   Depakote [divalproex sodium], Lithium, Nsaids, and Risperdal [risperidone]   Review of Systems Review of Systems   Physical Exam Triage Vital Signs ED Triage Vitals [04/07/19 0848]  Enc Vitals Group     BP (!) 145/120     Pulse Rate (!) 134     Resp (!) 22     Temp 98.4 F (36.9 C)     Temp Source Temporal     SpO2 99 %     Weight       Height      Head Circumference      Peak Flow      Pain Score 9     Pain Loc      Pain Edu?      Excl. in [place]?    No data found.  Updated Vital Signs BP (!) 145/120   Pulse (!) 134   Temp 98.4 F (36.9 C) (Temporal)   Resp (!) 22   SpO2 99%   Visual Acuity Right Eye Distance:   Left Eye Distance:   Bilateral Distance:    Right Eye Near:   Left Eye Near:    Bilateral Near:     Physical Exam Vitals signs and nursing note reviewed.  Constitutional:      General: He is not in acute distress.    Appearance: He is not ill-appearing, toxic-appearing or diaphoretic.  HENT:     Head: Normocephalic and atraumatic.     Mouth/Throat:     Mouth: Mucous membranes are dry.     Pharynx: Posterior oropharyngeal erythema and uvula swelling present.     Comments: Uvula deviation and severe right tonsillar swelling more on the right Muffled voice Pulmonary:     Effort: Pulmonary effort is normal.  Skin:    General: Skin is warm and dry.  Neurological:     Mental Status: He is alert.  Psychiatric:        Mood and Affect: Mood normal.      UC Treatments / Results  Labs (all labs ordered are listed, but only abnormal results are displayed) Labs Reviewed  POCT RAPID STREP A - Abnormal; Notable for the following components:      Result Value   Streptococcus, Group A Screen (Direct) POSITIVE (*)    All other components within normal limits    EKG None  Radiology No results found.  Procedures Procedures (including critical care time)  Medications Ordered in UC Medications - No data to display  Initial Impression / Assessment and Plan / UC Course  I have reviewed the triage vital signs and the nursing notes.  Pertinent labs & imaging results that were available during my care of the patient were reviewed by me and considered in my medical decision making (see chart for details).     Strep test positive here.  Worried of underlying peritonsillar abscess.  Significant  tonsillar swelling and right neck swelling.  Patient is tachycardic Will send to ER for further evaluation and management. Final Clinical Impressions(s) / UC Diagnoses   Final diagnoses:  Peritonsillar abscess  Strep pharyngitis     Discharge Instructions     Please go to the ER for further evaluation of your peritonsillar abscess.    ED Prescriptions    None  Controlled Substance Prescriptions [place] Controlled Substance Registry consulted? No   Orvan July, NP 04/07/19 [phone]

## 2019-04-07 NOTE — Consult Note (Addendum)
OTOLARYNGOLOGY CONSULTATION  Referring Physician: Dr. Francia Greaves Primary Care Physician: Medicine, [facility] Patient Location at Initial Consult: Emergency Department Chief Complaint/Reason for Consult: Peritonsillar abscess, right  History of Presenting Illness:    [name] is a  38 y.o. male presenting with swelling on the right side of his throat.  This began approximately 3 to 4 days ago and has worsened.  There is sharp pain.  There is some difficulty with eating and drinking.  He states he has not been able to drink well.  However he is urinating.  He is not spitting his saliva into a cup.  He is able to tolerate his own saliva.  He has never had any issues recurrent strep throat or with peritonsillar abscesses.  He has had strep throat a few times in the past.  No significant OSA history.  He is afebrile.  There is no stridor or shortness of breath.  He is tolerating lying flat the start of my examination today.  He denies any otalgia or trismus.  Patient has already received 8 mg of IV Decadron.   Past Medical History:  Diagnosis Date  . Crohn's disease ([place])   . GI (gastrointestinal bleed) 04/2013  . Multiple myeloma ([place])   . Schizophrenia ([place])   . Sickle cell anemia (HCC)   . Suicide attempt by drug ingestion ([place])   . Ulcerative colitis     Past Surgical History:  Procedure Laterality Date  . ESOPHAGOGASTRODUODENOSCOPY N/A 05/24/2013   Procedure: ESOPHAGOGASTRODUODENOSCOPY (EGD);  Surgeon: Ladene Artist, MD;  Location: [facility] ENDOSCOPY;  Service: Endoscopy;  Laterality: N/A;  . FLEXIBLE SIGMOIDOSCOPY  12/05/2011   Procedure: FLEXIBLE SIGMOIDOSCOPY;  Surgeon: Beryle Beams, MD;  Location: [facility] ENDOSCOPY;  Service: Endoscopy;  Laterality: N/A;  . MANDIBLE FRACTURE SURGERY      Family History  Problem Relation Age of Onset  . Liver cancer Other     Social History   Socioeconomic History  . Marital status: Single    Spouse name: Not on file  . Number  of children: Not on file  . Years of education: GED  . Highest education level: Not on file  Occupational History  . Occupation: disabled  Social Needs  . Financial resource strain: Not on file  . Food insecurity    Worry: Not on file    Inability: Not on file  . Transportation needs    Medical: Not on file    Non-medical: Not on file  Tobacco Use  . Smoking status: Current Some Day Smoker    Packs/day: 0.25    Years: 20.00    Pack years: 5.00    Types: Cigarettes  . Smokeless tobacco: Never Used  Substance and Sexual Activity  . Alcohol use: Yes    Comment: occasional  . Drug use: Yes    Types: Cocaine, Marijuana    Comment: +THC; +COC per chart  . Sexual activity: Not on file  Lifestyle  . Physical activity    Days per week: Not on file    Minutes per session: Not on file  . Stress: Not on file  Relationships  . Social Herbalist on phone: Not on file    Gets together: Not on file    Attends religious service: Not on file    Active member of club or organization: Not on file    Attends meetings of clubs or organizations: Not on file    Relationship status: Not on  file  Other Topics Concern  . Not on file  Social History Narrative   Pt lives with mother in [place].  He is disabled.    Current Facility-Administered Medications on File Prior to Encounter  Medication Dose Route Frequency Provider Last Rate Last Dose  . magnesium hydroxide (MILK OF MAGNESIA) suspension 30 mL  30 mL Oral Daily PRN Encarnacion Slates, NP       Current Outpatient Medications on File Prior to Encounter  Medication Sig Dispense Refill  . benztropine (COGENTIN) 1 MG tablet Take 1 mg by mouth at bedtime.     . dicyclomine (BENTYL) 20 MG tablet Take 1 tablet (20 mg total) by mouth 2 (two) times daily. 20 tablet 0  . haloperidol (HALDOL) 10 MG tablet Take 10 mg by mouth at bedtime.     . haloperidol decanoate (HALDOL DECANOATE) 100 MG/ML injection Inject 100 mg into the muscle  every 28 (twenty-eight) days.    . ondansetron (ZOFRAN) 4 MG tablet Take 1 tablet (4 mg total) by mouth every 8 (eight) hours as needed for nausea or vomiting. 10 tablet 0  . promethazine (PHENERGAN) 25 MG suppository Place 1 suppository (25 mg total) rectally every 6 (six) hours as needed for nausea or vomiting. 12 each 0    Allergies  Allergen Reactions  . Depakote [Divalproex Sodium] Other (See Comments)    Reaction:  Seizures   . Lithium Diarrhea and Other (See Comments)    Reaction:  Rectal bleeding   . Nsaids Itching and Other (See Comments)    Reaction:  Rectal bleeding   . Risperdal [Risperidone] Shortness Of Breath and Other (See Comments)    Chest pain     Review of Systems: ROS complete and negative except for the above-mentioned.   OBJECTIVE: Vital Signs: Vitals:   04/07/19 1400 04/07/19 1530  BP: (!) 149/96 (!) 147/90  Pulse: (!) 119 (!) 113  Resp: 20 16  Temp:    SpO2: 98% 98%    I&O No intake or output data in the 24 hours ending 04/07/19 1635  Physical Exam General: Well developed, well nourished. No acute distress. Voice without dysphonia.   Head/Face: Normocephalic, atraumatic. No scars or lesions. No sinus tenderness. Facial nerve intact and equal bilaterally.  No facial lacerations. Salivary glands non tender and without palpable masses  Eyes: Globes well positioned, no proptosis Lids: No periorbital edema/ecchymosis. No lid laceration Conjunctiva: No chemosis, hemorrhage PERRL Extra occular movement: Full ROM bilaterally. No gaze restriction    Ears: No gross deformity. Normal external canal. Tympanic membrane intact bilaterally and without effusion  Hearing:  Normal speech reception.  Nose: No gross deformity or lesions. No purulent discharge. Septum midline. No turbinate hypertrophy.  Mouth/Oropharynx: Lips without any lesions. Dentition normal. No mucosal lesions within the oropharynx.  +right peritonsillar fluctuance, tonsillar enlargement,  minimal trismus. +posterior pharyngeal erythema  Neck: Trachea midline. No masses. No thyromegaly or nodules palpated. No crepitus.  Lymphatic: No lymphadenopathy in the neck.  Respiratory: No stridor or distress.  Cardiovascular: Regular rate and rhythm.  Extremities: No edema or cyanosis. Warm and well-perfused.  Skin: No scars or lesions on face or neck.  Neurologic: CN II-XII intact. Moving all extremities without gross abnormality.  Other:      Labs: Lab Results  Component Value Date   WBC 24.1 (H) 04/07/2019   HGB 14.8 04/07/2019   HCT 45.7 04/07/2019   PLT 409 (H) 04/07/2019   CHOL 171 06/06/2016   TRIG 292 (H)  06/06/2016   HDL 53 06/06/2016   ALT 22 03/19/2019   AST 21 03/19/2019   NA 134 (L) 04/07/2019   K 4.1 04/07/2019   CL 99 04/07/2019   CREATININE 1.05 04/07/2019   BUN 10 04/07/2019   CO2 25 04/07/2019   TSH 1.690 06/06/2016   INR 1.32 12/17/2009   HGBA1C 5.5 06/06/2016     Review of Ancillary Data / Diagnostic Tests: CT neck personally reviewed-my interpretation of the study is that there is a right-sided multiloculated peritonsillar extending into the tonsillar abscess.  This does appear to be pretty well-circumscribed with several loculations.  There is no retropharyngeal element to this.  No significant glottic swelling.  COVID testing was negative. Strep testing rapid swab was positive. WBC was 24.  Patient afebrile.   Procedure: Right PTA drainage Findings: Large amount dishwater-type debris in the right peritonsillar space, immediate improvement in the patient's swallowing and smaller tonsil size Details: The patient's oropharynx was anesthetized with Cetacaine spray followed by 1% lidocaine with 1-200,000 epinephrine.  Next, an incision was made with a 15 blade.  Loculations were opened with a hemostat. The wound bed was copiously irrigated with saline.  The patient tolerated the procedure well and stated that he felt foul-smelling debris good on his  throat.  All instrumentation was removed.  There were no complications.    ASSESSMENT:  38 y.o. male with large multiloculated right peritonsillar extending into tonsillar abscess.  Status post incision and drainage.  RECOMMENDATIONS: -Patient require 10 days of antibiotics likely Augmentin due to strep positive status.  Another alternative for this could be clindamycin. -Medrol Dosepak -Care instructions provided to the patient.  Strict return precautions advised Follow-up with me in 2 days to ensure improvement.  Of note I did offer the patient an admission with IV antibiotics overnight however he politely declined and assures me that he will follow instructions and return precautions and follow-up with me in 36- 48 hours.   Gavin Pound, MD  [facility], [place] Office phone [phone]

## 2019-04-07 NOTE — ED Provider Notes (Signed)
Patient is feeling better after incision/drainage from ENT.  Not a lot of pus came out though the patient is feeling better and tonsil is smaller.  He feels well enough for discharge.  Will add Medrol Dosepak per Dr. Blenda Nicely.  Follow-up in her office.  We discussed return precautions.   Sherwood Gambler, MD 04/07/19 [phone]

## 2019-04-09 DIAGNOSIS — J36 Peritonsillar abscess: Secondary | ICD-10-CM | POA: Diagnosis not present

## 2019-05-21 ENCOUNTER — Emergency Department (HOSPITAL_COMMUNITY)
Admission: EM | Admit: 2019-05-21 | Discharge: 2019-05-22 | Disposition: A | Discharge status: Home / Self Care | Payer: Medicare Other | Attending: Emergency Medicine | Admitting: Emergency Medicine

## 2019-05-21 ENCOUNTER — Other Ambulatory Visit: Payer: Self-pay

## 2019-05-21 ENCOUNTER — Encounter (HOSPITAL_COMMUNITY): Payer: Self-pay | Admitting: Emergency Medicine

## 2019-05-21 DIAGNOSIS — F1414 Cocaine abuse with cocaine-induced mood disorder: Secondary | ICD-10-CM | POA: Diagnosis not present

## 2019-05-21 DIAGNOSIS — I10 Essential (primary) hypertension: Secondary | ICD-10-CM | POA: Diagnosis not present

## 2019-05-21 DIAGNOSIS — F17210 Nicotine dependence, cigarettes, uncomplicated: Secondary | ICD-10-CM | POA: Insufficient documentation

## 2019-05-21 DIAGNOSIS — F209 Schizophrenia, unspecified: Secondary | ICD-10-CM | POA: Insufficient documentation

## 2019-05-21 DIAGNOSIS — Z20828 Contact with and (suspected) exposure to other viral communicable diseases: Secondary | ICD-10-CM | POA: Diagnosis not present

## 2019-05-21 DIAGNOSIS — R45851 Suicidal ideations: Secondary | ICD-10-CM | POA: Diagnosis not present

## 2019-05-21 DIAGNOSIS — Z008 Encounter for other general examination: Secondary | ICD-10-CM | POA: Insufficient documentation

## 2019-05-21 DIAGNOSIS — F14150 Cocaine abuse with cocaine-induced psychotic disorder with delusions: Secondary | ICD-10-CM | POA: Diagnosis not present

## 2019-05-21 DIAGNOSIS — F600 Paranoid personality disorder: Secondary | ICD-10-CM | POA: Diagnosis present

## 2019-05-21 DIAGNOSIS — R440 Auditory hallucinations: Secondary | ICD-10-CM | POA: Diagnosis present

## 2019-05-21 DIAGNOSIS — B86 Scabies: Secondary | ICD-10-CM | POA: Diagnosis not present

## 2019-05-21 DIAGNOSIS — R442 Other hallucinations: Secondary | ICD-10-CM | POA: Diagnosis not present

## 2019-05-21 DIAGNOSIS — F29 Unspecified psychosis not due to a substance or known physiological condition: Secondary | ICD-10-CM | POA: Diagnosis not present

## 2019-05-21 LAB — COMPREHENSIVE METABOLIC PANEL
ALT: 16 U/L (ref 0–44)
AST: 20 U/L (ref 15–41)
Albumin: 3.9 g/dL (ref 3.5–5.0)
Alkaline Phosphatase: 79 U/L (ref 38–126)
Anion gap: 9 (ref 5–15)
BUN: 13 mg/dL (ref 6–20)
CO2: 24 mmol/L (ref 22–32)
Calcium: 8.9 mg/dL (ref 8.9–10.3)
Chloride: 105 mmol/L (ref 98–111)
Creatinine, Ser: 1.01 mg/dL (ref 0.61–1.24)
GFR calc Af Amer: >60 mL/min (ref >60)
GFR calc non Af Amer: >60 mL/min (ref >60)
Glucose, Bld: 110 mg/dL — ABNORMAL HIGH (ref 70–99)
Potassium: 3.8 mmol/L (ref 3.5–5.1)
Sodium: 138 mmol/L (ref 135–145)
Total Bilirubin: 0.5 mg/dL (ref 0.3–1.2)
Total Protein: 7.8 g/dL (ref 6.5–8.1)

## 2019-05-21 LAB — CBC
HCT: 44 % (ref 39.0–52.0)
Hemoglobin: 14 g/dL (ref 13.0–17.0)
MCH: 26.9 pg (ref 26.0–34.0)
MCHC: 31.8 g/dL (ref 30.0–36.0)
MCV: 84.5 fL (ref 80.0–100.0)
Platelets: 332 10*3/uL (ref 150–400)
RBC: 5.21 MIL/uL (ref 4.22–5.81)
RDW: 16.5 % — ABNORMAL HIGH (ref 11.5–15.5)
WBC: 10.5 10*3/uL (ref 4.0–10.5)
nRBC: 0 % (ref 0.0–0.2)

## 2019-05-21 LAB — RAPID URINE DRUG SCREEN, HOSP PERFORMED
Amphetamines: NOT DETECTED
Barbiturates: NOT DETECTED
Benzodiazepines: NOT DETECTED
Cocaine: POSITIVE — AB
Opiates: NOT DETECTED
Tetrahydrocannabinol: POSITIVE — AB

## 2019-05-21 LAB — ETHANOL: Alcohol, Ethyl (B): <10 mg/dL (ref <10)

## 2019-05-21 LAB — ACETAMINOPHEN LEVEL: Acetaminophen (Tylenol), Serum: <10 ug/mL — ABNORMAL LOW (ref 10–30)

## 2019-05-21 LAB — SALICYLATE LEVEL: Salicylate Lvl: <7 mg/dL (ref 2.8–30.0)

## 2019-05-21 MED ORDER — PERMETHRIN 5 % EX CREA
TOPICAL_CREAM | Freq: Once | CUTANEOUS | Status: AC
  Administered 2019-05-21: 18:00:00 via TOPICAL
  Filled 2019-05-21: qty 60

## 2019-05-21 MED ORDER — HYDROXYZINE HCL 25 MG PO TABS
25.0000 mg | ORAL_TABLET | Freq: Once | ORAL | Status: AC
  Administered 2019-05-21: 25 mg via ORAL
  Filled 2019-05-21: qty 1

## 2019-05-21 MED ORDER — PERMETHRIN 1 % EX LOTN
TOPICAL_LOTION | Freq: Once | CUTANEOUS | Status: DC
  Filled 2019-05-21: qty 59

## 2019-05-21 MED ORDER — PERMETHRIN 5 % EX CREA
TOPICAL_CREAM | Freq: Once | CUTANEOUS | Status: DC
  Filled 2019-05-21: qty 60

## 2019-05-21 NOTE — ED Notes (Signed)
Pt verifies that he has not been taking his Haldol 5 mg or cogentin 0.5 mg for 3 days. 2 days ago he snorted some cocaine, smoked some marijuana and drank alcohol. He wishes to be dead and attempted to cut himself with a knife unsuccessfully. He did not break skin. Affect is flat and mood is depressed. Behavior is cooperative.

## 2019-05-21 NOTE — ED Provider Notes (Signed)
[place] DEPT Provider Note   CSN: [id number] Arrival date & time: 05/21/19  1450    History   Chief Complaint Chief Complaint  Patient presents with  . Medical Clearance    HPI [name] is a 38 y.o. male.     38 year old male presents for behavioral health evaluation, history of substance abuse and schizophrenia.  Patient states that he attempted suicide today by holding a knife to his belly and his arm.  He did not break the skin, does not have any wounds as result of this event today.  Reports hearing voices sometimes in his backyard wearing his house but knows there is no one there.  Admits to marijuana and cocaine use, occasional alcohol use.  Mental health social worker called EMS.  Stopped meds about 3 days ago and AH for 2 days. SI since this morning.  Patient also reports itching to both of his arms and his hands.  Denies homicidal ideation.  No other complaints or concerns today.     Past Medical History:  Diagnosis Date  . Crohn's disease ([place])   . GI (gastrointestinal bleed) 04/2013  . Multiple myeloma ([place])   . Schizophrenia ([place])   . Sickle cell anemia (HCC)   . Suicide attempt by drug ingestion ([place])   . Ulcerative colitis     Patient Active Problem List   Diagnosis Date Noted  . Schizophrenia ([place]) 02/23/2019  . Non compliance w medication regimen   . Cocaine use disorder, moderate, dependence ([place]) 06/09/2016  . Cannabis use disorder, severe, dependence ([place]) 06/09/2016  . Cocaine dependence with cocaine-induced mood disorder ([place]) 03/19/2016  . Cocaine abuse with cocaine-induced mood disorder ([place]) 03/03/2016  . Cocaine abuse ([place]) 03/03/2016  . Suicidal ideation   . Reflux esophagitis 05/24/2013  . Other specified gastritis without mention of hemorrhage 05/24/2013  . Nausea with vomiting 05/23/2013  . Anemia, unspecified 05/23/2013  . Nonspecific abnormal finding in stool contents 05/23/2013  . Paranoid  schizophrenia ([place])   . Ulcerative colitis ([place])   . SUBSTANCE ABUSE, MULTIPLE 12/31/2007    Past Surgical History:  Procedure Laterality Date  . ESOPHAGOGASTRODUODENOSCOPY N/A 05/24/2013   Procedure: ESOPHAGOGASTRODUODENOSCOPY (EGD);  Surgeon: Ladene Artist, MD;  Location[facility] ENDOSCOPY;  Service: Endoscopy;  Laterality: N/A;  . FLEXIBLE SIGMOIDOSCOPY  12/05/2011   Procedure: FLEXIBLE SIGMOIDOSCOPY;  Surgeon: Beryle Beams, MD;  Location: [facility] ENDOSCOPY;  Service: Endoscopy;  Laterality: N/A;  . MANDIBLE FRACTURE SURGERY          Home Medications    Prior to Admission medications   Medication Sig Start Date End Date Taking? Authorizing Provider  benztropine (COGENTIN) 1 MG tablet Take 1 mg by mouth at bedtime.    Yes [provider]  haloperidol (HALDOL) 10 MG tablet Take 10 mg by mouth at bedtime.  02/26/18  Yes [provider]  amoxicillin-clavulanate (AUGMENTIN) 875-125 MG tablet Take 1 tablet by mouth every 12 (twelve) hours. Patient not taking: Reported on 05/21/2019 04/07/19   Valarie Merino, MD  dicyclomine (BENTYL) 20 MG tablet Take 1 tablet (20 mg total) by mouth 2 (two) times daily. Patient not taking: Reported on 05/21/2019 03/16/19   Margarita Mail, PA-C  haloperidol decanoate (HALDOL DECANOATE) 100 MG/ML injection Inject 100 mg into the muscle every 28 (twenty-eight) days. 02/14/18   [provider]  methylPREDNISolone (MEDROL DOSEPAK) 4 MG TBPK tablet Take as instructed Patient not taking: Reported on 05/21/2019 04/07/19   Sherwood Gambler, MD  ondansetron (ZOFRAN) 4 MG tablet Take 1 tablet (4 mg total) by mouth every 8 (eight) hours as needed for nausea or vomiting. Patient not taking: Reported on 05/21/2019 03/16/19   Margarita Mail, PA-C  promethazine (PHENERGAN) 25 MG suppository Place 1 suppository (25 mg total) rectally every 6 (six) hours as needed for nausea or vomiting. Patient not taking: Reported on 05/21/2019 03/19/19   Larene Pickett, PA-C     Family History Family History  Problem Relation Age of Onset  . Liver cancer Other     Social History Social History   Tobacco Use  . Smoking status: Current Some Day Smoker    Packs/day: 0.25    Years: 20.00    Pack years: 5.00    Types: Cigarettes  . Smokeless tobacco: Never Used  Substance Use Topics  . Alcohol use: Yes    Comment: occasional  . Drug use: Yes    Types: Cocaine, Marijuana    Comment: +THC; +COC per chart     Allergies   Depakote [divalproex sodium], Lithium, Nsaids, and Risperdal [risperidone]   Review of Systems Review of Systems  Constitutional: Negative for fever.  Respiratory: Negative for shortness of breath.   Cardiovascular: Negative for chest pain.  Gastrointestinal: Negative for abdominal pain.  Genitourinary: Negative for difficulty urinating.  Musculoskeletal: Negative for arthralgias and myalgias.  Skin: Positive for rash. Negative for wound.  Allergic/Immunologic: Negative for immunocompromised state.  Psychiatric/Behavioral: Positive for suicidal ideas.  All other systems reviewed and are negative.    Physical Exam Updated Vital Signs BP (!) 145/83 (BP Location: Left Arm)   Pulse (!) 102   Temp 99.3 F (37.4 C) (Oral)   Resp 20   SpO2 98%   Physical Exam Vitals signs and nursing note reviewed.  Constitutional:      General: He is not in acute distress.    Appearance: He is well-developed. He is not diaphoretic.  HENT:     Head: Normocephalic and atraumatic.  Cardiovascular:     Rate and Rhythm: Normal rate and regular rhythm.     Pulses: Normal pulses.     Heart sounds: Normal heart sounds.  Pulmonary:     Effort: Pulmonary effort is normal.     Breath sounds: Normal breath sounds.  Abdominal:     Palpations: Abdomen is soft.     Tenderness: There is no abdominal tenderness.  Musculoskeletal:        General: No signs of injury.  Skin:    General: Skin is warm and dry.     Findings: Rash present. No erythema.      Comments: Papular rash noted to web spaces of fingers and bilateral arms, no visible furrows, no vesicles.   Neurological:     Mental Status: He is alert and oriented to person, place, and time.  Psychiatric:        Mood and Affect: Affect is flat.        Speech: Speech normal.        Behavior: Behavior is not agitated, aggressive or combative.        Thought Content: Thought content includes suicidal ideation. Thought content does not include homicidal ideation. Thought content includes suicidal plan. Thought content does not include homicidal plan.      ED Treatments / Results  Labs (all labs ordered are listed, but only abnormal results are displayed) Labs Reviewed  RAPID URINE DRUG SCREEN, HOSP PERFORMED - Abnormal; Notable for the following components:  Result Value   Cocaine POSITIVE (*)    Tetrahydrocannabinol POSITIVE (*)    All other components within normal limits  COMPREHENSIVE METABOLIC PANEL  ETHANOL  SALICYLATE LEVEL  ACETAMINOPHEN LEVEL  CBC    EKG None  Radiology No results found.  Procedures Procedures (including critical care time)  Medications Ordered in ED Medications  hydrOXYzine (ATARAX/VISTARIL) tablet 25 mg (has no administration in time range)  permethrin (ELIMITE) 5 % cream (has no administration in time range)     Initial Impression / Assessment and Plan / ED Course  I have reviewed the triage vital signs and the nursing notes.  Pertinent labs & imaging results that were available during my care of the patient were reviewed by me and considered in my medical decision making (see chart for details).  Clinical Course as of May 20 1706  Wed May 20, 3628  [id number] 38 year old male presents to the ER, states he tried to kill himself today by holding a knife to his stomach and his arm.  Patient does not have any wounds today.  Patient does have a rash to both of his arms which he states is itchy.  On exam patient has a papular rash to the  webspaces of the fingers extending up into both arms.  Patient will be treated for possible scabies with Elimite. Urinalysis positive for cocaine and marijuana.  Remaining labs pending.  Patient was given Vistaril for itching. Waiting behavioral health evaluation for treatment of disposition planning.   [LM]    Clinical Course User Index [LM] Tacy Learn, PA-C      Final Clinical Impressions(s) / ED Diagnoses   Final diagnoses:  Suicidal ideation  Scabies    ED Discharge Orders    None       Tacy Learn, PA-C 05/21/19 1707    Lajean Saver, MD 05/21/19 [phone]

## 2019-05-21 NOTE — ED Triage Notes (Signed)
Mental health social worker called EMS.  Stopped meds about 3 days ago and AH for 2 days. SI since this morning. Attempted to cut his arms and belly with a knife. EMS report no broken skin. A&O. No other complaints.  Appears to have poison ivy on both arms.  Lives with his mother.

## 2019-05-21 NOTE — ED Notes (Signed)
He has been sleeping since start of shift awake now. He states he feels sad, stopped meds recently but said he felt sad even before stopping meds. Also, continues to state he has SI thoughts but no specific plan. Mom called and he called her back now that he is awake. He states things are OK with him and mom. Vitals collected. Cooperative and calm. Notified of the recommendation he was recommended for inpatient care and he is in agreement with this.

## 2019-05-21 NOTE — ED Notes (Signed)
Attempted to draw blood without success x1. Called phlebotomy to collect.

## 2019-05-21 NOTE — BH Assessment (Addendum)
Tele Assessment Note   Patient Name: [name] MRN: [id number] Referring Physician: Roque Lias Location of Patient: [facility] Location of Provider: [place] Department  [name] is a single 38 y.o. male who presents voluntarily to [facility] reporting symptoms of depression, substance abuse and suicidal ideation. Pt has a history of schizophrenia dx. Pt reports medication noncompliance for past 3 days. Pt reports current suicidal ideation with plans of cutting himself with a knife. Mental health worker called EMS today when pt put knife to his belly. He did not break the skin. Pt reports about 40 past suicide attempts. Pt acknowledges multiple symptoms of Depression. Pt denies homicidal ideation/ history of violence. Chart indicates past physical aggression towards mother. Pt reports current auditory hallucinations- voices. Pt states he heard someone yell 'bingo' a short while ago and someone told pt no one said it.   Pt reports he uses cocaine daily (snorting and smoking), THC weekly and alcohol about once weekly. Pt reports he has about 10 beers when he drinks.   Pt lives with his mother, and she is his main support. Pt denies history of abuse and trauma. Pt denies family history mental illness. Pt has Disability income. Pt has poor insight and judgment. Pt denies history of legal charges. ? Pt's OP history includes [facility]. IP history includes [facility] 02/23/19 & 05/2016.   Pt gave verbal permission to gain collateral from his mother. Call to pt's mother reach voicemail. No message left at this time. ? MSE: Pt is casually dressed, alert, oriented x4 with slightly slurred speech and normal motor behavior. Eye contact is good. Pt's mood is pleasant and affect is shallow. Affect is congruent with mood. Thought process is coherent and relevant. There is no indication pt is currently responding to internal stimuli or experiencing delusional thought content. Pt was cooperative  throughout assessment.   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 Crohn's disease ([place])   . GI (gastrointestinal bleed) 04/2013  . Multiple myeloma ([place])   . Schizophrenia ([place])   . Sickle cell anemia (HCC)   . Suicide attempt by drug ingestion ([place])   . Ulcerative colitis     Past Surgical History:  Procedure Laterality Date  . ESOPHAGOGASTRODUODENOSCOPY N/A 05/24/2013   Procedure: ESOPHAGOGASTRODUODENOSCOPY (EGD);  Surgeon: Ladene Artist, MD;  Location: [facility] ENDOSCOPY;  Service: Endoscopy;  Laterality: N/A;  . FLEXIBLE SIGMOIDOSCOPY  12/05/2011   Procedure: FLEXIBLE SIGMOIDOSCOPY;  Surgeon: Beryle Beams, MD;  Location: [facility] ENDOSCOPY;  Service: Endoscopy;  Laterality: N/A;  . MANDIBLE FRACTURE SURGERY      Family History:  Family History  Problem Relation Age of Onset  . Liver cancer Other     Social History:  reports that he has been smoking cigarettes. He has a 5.00 pack-year smoking history. He has never used smokeless tobacco. He reports current alcohol use. He reports current drug use. Drugs: Cocaine and Marijuana.  Additional Social History:  Alcohol / Drug Use Pain Medications: See MAR- pt denies Prescriptions: See MAR- Haldol & pt states just started on cogentin Over the Counter: See MAR History of alcohol / drug use?: Yes Substance #1 Name of Substance 1: cocaine- smoke and snort 1 - Age of First Use: 13 1 - Frequency: daily 1 - Duration: month 1 - Last Use / Amount: 05/20/19 Substance #2 Name of  Substance 2: alcohol 2 - Age of First Use: 20 2 - Amount (size/oz): 10 2 - Frequency: once weekly 2 - Last Use / Amount: 05/20/19 Substance #3 Name of Substance 3: THC 3 - Age of First Use: 14 3 - Frequency: weekly 3 - Last Use / Amount: 05/20/19  CIWA: CIWA-Ar BP: (!) 145/83 Pulse Rate: (!)  102 COWS:    Allergies:  Allergies  Allergen Reactions  . Depakote [Divalproex Sodium] Other (See Comments)    Reaction:  Seizures   . Lithium Diarrhea and Other (See Comments)    Reaction:  Rectal bleeding   . Nsaids Itching and Other (See Comments)    Reaction:  Rectal bleeding   . Risperdal [Risperidone] Shortness Of Breath and Other (See Comments)    Chest pain    Home Medications: (Not in a hospital admission)   OB/GYN Status:  No LMP for male patient.  General Assessment Data Location of Assessment: [facility] ED TTS Assessment: In system Is this a Tele or Face-to-Face Assessment?: Tele Assessment Is this an Initial Assessment or a Re-assessment for this encounter?: Initial Assessment Patient Accompanied by:: N/A Language Other than English: No Living Arrangements: Other (Comment) What gender do you identify as?: Male Marital status: Single Living Arrangements: Parent(mother) Can pt return to current living arrangement?: Yes Admission Status: Voluntary Is patient capable of signing voluntary admission?: Yes Referral Source: Self/Family/Friend Insurance type: medicaid     Crisis Care Plan Living Arrangements: Parent(mother) Name of Psychiatrist: at [employer] Name of Therapist: [facility]  Education Status Is patient currently in school?: No Is the patient employed, unemployed or receiving disability?: Receiving disability income  Risk to self with the past 6 months Suicidal Ideation: Yes-Currently Present Has patient been a risk to self within the past 6 months prior to admission? : Yes Suicidal Intent: Yes-Currently Present Has patient had any suicidal intent within the past 6 months prior to admission? : Yes Is patient at risk for suicide?: Yes Suicidal Plan?: Yes-Currently Present Has patient had any suicidal plan within the past 6 months prior to admission? : Yes Specify Current Suicidal Plan: Use a knife What has been your use of drugs/alcohol within the last 12  months?: daily Previous Attempts/Gestures: Yes How many times?: 40 Other Self Harm Risks: male Triggers for Past Attempts: ("just natural") Intentional Self Injurious Behavior: Cutting, Burning, Bruising Family Suicide History: No Recent stressful life event(s): (denies) Persecutory voices/beliefs?: (voices. little while ago, heard someone say bingo) Depression: Yes Depression Symptoms: Despondent, Insomnia, Tearfulness, Isolating, Fatigue, Guilt, Loss of interest in usual pleasures, Feeling worthless/self pity Substance abuse history and/or treatment for substance abuse?: Yes([facility] at 38 years old) Suicide prevention information given to non-admitted patients: Not applicable  Risk to Others within the past 6 months Homicidal Ideation: No Thoughts of Harm to Others: No Current Homicidal Intent: No History of harm to others?: No Assessment of Violence: None Noted Does patient have access to weapons?: No(denies) Criminal Charges Pending?: No Does patient have a court date: No Is patient on probation?: No  Psychosis Hallucinations: Auditory, Visual(voices and shadows) Delusions: Persecutory  Mental Status Report Appearance/Hygiene: Unremarkable Eye Contact: Good  Cognitive Functioning Appetite: Fair Have you had any weight changes? : Loss Sleep: Decreased Total Hours of Sleep: 2 Vegetative Symptoms: Staying in bed  ADLScreening [facility] Assessment Services) Patient's cognitive ability adequate to safely complete daily activities?: Yes Patient able to express need for assistance with ADLs?: Yes Independently performs ADLs?: Yes (appropriate for developmental age)  Prior  Inpatient Therapy Prior Inpatient Therapy: Yes Prior Therapy Facilty/Provider(s): [facility] Reason for Treatment: SI  Prior Outpatient Therapy Prior Outpatient Therapy: Yes(Can't remember name- in [place]) Prior Therapy Dates: ongoing Does patient have an ACCT team?: Yes Does patient have  Intensive In-House Services?  : Yes Does patient have [facility] services? : No Does patient have P4CC services?: No  ADL Screening (condition at time of admission) Patient's cognitive ability adequate to safely complete daily activities?: Yes Is the patient deaf or have difficulty hearing?: No Does the patient have difficulty seeing, even when wearing glasses/contacts?: No Does the patient have difficulty concentrating, remembering, or making decisions?: No Patient able to express need for assistance with ADLs?: Yes Does the patient have difficulty dressing or bathing?: No Independently performs ADLs?: Yes (appropriate for developmental age) Does the patient have difficulty walking or climbing stairs?: No Weakness of Legs: None Weakness of Arms/Hands: None  Home Assistive Devices/Equipment Home Assistive Devices/Equipment: None  Therapy Consults (therapy consults require a physician order) PT Evaluation Needed: No OT Evalulation Needed: No SLP Evaluation Needed: No Abuse/Neglect Assessment (Assessment to be complete while patient is alone) Abuse/Neglect Assessment Can Be Completed: Yes Physical Abuse: Denies Verbal Abuse: Denies Sexual Abuse: Denies Exploitation of patient/patient's resources: Denies Self-Neglect: Denies Values / Beliefs Cultural Requests During Hospitalization: None Spiritual Requests During Hospitalization: None Consults Spiritual Care Consult Needed: No Social Work Consult Needed: No Regulatory affairs officer (For Healthcare) Does Patient Have a Medical Advance Directive?: No Would patient like information on creating a medical advance directive?: No - Patient declined          Disposition:  Keith Rankin, NP recommends inpt psychiatric care Disposition Initial Assessment Completed for this Encounter: Yes Disposition of Patient: Admit   Richardean Chimera 05/21/2019 4:36 PM

## 2019-05-21 NOTE — ED Notes (Signed)
[facility] -[phone]; after hours [phone]

## 2019-05-22 DIAGNOSIS — F600 Paranoid personality disorder: Secondary | ICD-10-CM | POA: Diagnosis present

## 2019-05-22 DIAGNOSIS — F14150 Cocaine abuse with cocaine-induced psychotic disorder with delusions: Secondary | ICD-10-CM

## 2019-05-22 DIAGNOSIS — F209 Schizophrenia, unspecified: Secondary | ICD-10-CM | POA: Diagnosis not present

## 2019-05-22 LAB — SARS CORONAVIRUS 2 BY RT PCR (HOSPITAL ORDER, PERFORMED IN ~~LOC~~ HOSPITAL LAB): SARS Coronavirus 2: NEGATIVE

## 2019-05-22 MED ORDER — HALOPERIDOL 5 MG PO TABS: 10.0000 mg | ORAL_TABLET | Freq: Every day | ORAL | Status: DC

## 2019-05-22 MED ORDER — GABAPENTIN 100 MG PO CAPS
200.0000 mg | ORAL_CAPSULE | Freq: Two times a day (BID) | ORAL | Status: DC
  Administered 2019-05-22: 11:00:00 200 mg via ORAL
  Filled 2019-05-22: qty 2

## 2019-05-22 MED ORDER — BENZTROPINE MESYLATE 1 MG PO TABS: 1.0000 mg | ORAL_TABLET | Freq: Every day | ORAL | Status: DC

## 2019-05-22 NOTE — ED Notes (Signed)
Has slept all shift. Day shift to complete COVID swab test. TTS recommended he go inpatient psych and will need a negative COVID test for admission.

## 2019-05-22 NOTE — BH Assessment (Signed)
[place] Assessment Progress Note  Per Corena Pilgrim, MD, this pt does not require psychiatric hospitalization at this time.  Pt is to be discharged from [facility] with recommendation to continue treatment with the [facility].  This has been included in pt's discharge instructions.  Pt's nurse, Nena Jordan, has been notified.  At 10:59 this Probation officer called Barnett Applebaum with the [facility], per her request, to notify her of pt's disposition.  Jalene Mullet, [place] Triage Specialist [phone]

## 2019-05-22 NOTE — ED Notes (Signed)
Pt discharged safely with discharge instructions.  All belongings were returned to pt.

## 2019-05-22 NOTE — Consult Note (Addendum)
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  Pt denies suicidal/homicidal ideation, denies auditory/visual hallucinations and does not appear to be responding to internal stimuli. Pt stated he did have suicidal thoughts yesterday but is today able to contract for safety today. He held a knife to his stomach but he did not break the skin. His mother took the knife away form him. He lives with his mother and stated they get along good. He is calm and cooperative. His UDS positive for cocaine and THC. He stated he does use cocaine but not every day. He has an Building services engineer. He is on Haldol Decanoate IM 100 mg every 28 days, last injection was on 04-27-2019. Verified by this Probation officer with Barnett Applebaum at [employer], [phone]. She would like to be notified if Pt is discharged. Pt stated he often forget to take his Cogentin and Haldol PO at bedtime. Discussed the importance of taking medications as prescribed and not using illegal drugs. Pt is psychiatrically clear.   Total Time spent with patient: 30 minutes  Past Psychiatric History: As above  Past Medical History:  Past Medical History:  Diagnosis Date  . Crohn's disease ([place])   . GI (gastrointestinal bleed) 04/2013  . Multiple myeloma ([place])   . Schizophrenia ([place])   . Sickle cell anemia (HCC)   . Suicide attempt by drug ingestion ([place])   . Ulcerative colitis     Past Surgical History:  Procedure Laterality Date  . ESOPHAGOGASTRODUODENOSCOPY N/A 05/24/2013   Procedure: ESOPHAGOGASTRODUODENOSCOPY (EGD);  Surgeon: Ladene Artist, MD;  Location: [facility] ENDOSCOPY;   Service: Endoscopy;  Laterality: N/A;  . FLEXIBLE SIGMOIDOSCOPY  12/05/2011   Procedure: FLEXIBLE SIGMOIDOSCOPY;  Surgeon: Beryle Beams, MD;  Location: [facility] ENDOSCOPY;  Service: Endoscopy;  Laterality: N/A;  . MANDIBLE FRACTURE SURGERY     Family History:  Family History  Problem Relation Age of Onset  . Liver cancer Other    Family Psychiatric  History: Pt did not give this information Social History:  Social History   Substance and Sexual Activity  Alcohol Use Yes   Comment: occasional     Social History   Substance and Sexual Activity  Drug Use Yes  . Types: Cocaine, Marijuana   Comment: +THC; +COC per chart    Social History   Socioeconomic History  . Marital status: Single    Spouse name: Not on file  . Number of children: Not on file  . Years of education: GED  . Highest education level: Not on file  Occupational History  . Occupation: disabled  Social Needs  . Financial resource strain: Not on file  . Food insecurity    Worry: Not on file    Inability: Not on file  . Transportation needs    Medical: Not on file    Non-medical: Not on file  Tobacco Use  . Smoking status: Current Some Day Smoker    Packs/day: 0.25    Years: 20.00    Pack years: 5.00    Types: Cigarettes  . Smokeless tobacco: Never Used  Substance and Sexual Activity  . Alcohol use: Yes    Comment: occasional  . Drug  use: Yes    Types: Cocaine, Marijuana    Comment: +THC; +COC per chart  . Sexual activity: Not on file  Lifestyle  . Physical activity    Days per week: Not on file    Minutes per session: Not on file  . Stress: Not on file  Relationships  . Social connections    Talks on phone: Not on file    Gets together: Not on file    Attends religious service: Not on file    Active member of club or organization: Not on file    Attends meetings of clubs or organizations: Not on file    Relationship status: Not on file  Other Topics Concern  . Not on file  Social History  Narrative   Pt lives with mother in [place].  He is disabled.    Has this patient used any form of tobacco in the last 30 days? (Cigarettes, Smokeless Tobacco, Cigars, and/or Pipes) Prescription not provided because: Pt denies tobacco use  Current Medications: Current Facility-Administered Medications  Medication Dose Route Frequency Provider Last Rate Last Dose  . benztropine (COGENTIN) tablet 1 mg  1 mg Oral QHS , , MD      . gabapentin (NEURONTIN) capsule 200 mg  200 mg Oral BID , , MD   200 mg at 05/22/19 1123  . haloperidol (HALDOL) tablet 10 mg  10 mg Oral QHS , , MD      . permethrin (ELIMITE) 5 % cream   Topical Once Haviland, Julie, MD       Current Outpatient Medications  Medication Sig Dispense Refill  . benztropine (COGENTIN) 1 MG tablet Take 1 mg by mouth at bedtime.     . haloperidol (HALDOL) 10 MG tablet Take 10 mg by mouth at bedtime.     . amoxicillin-clavulanate (AUGMENTIN) 875-125 MG tablet Take 1 tablet by mouth every 12 (twelve) hours. (Patient not taking: Reported on 05/21/2019) 14 tablet 0  . dicyclomine (BENTYL) 20 MG tablet Take 1 tablet (20 mg total) by mouth 2 (two) times daily. (Patient not taking: Reported on 05/21/2019) 20 tablet 0  . haloperidol decanoate (HALDOL DECANOATE) 100 MG/ML injection Inject 100 mg into the muscle every 28 (twenty-eight) days.    . methylPREDNISolone (MEDROL DOSEPAK) 4 MG TBPK tablet Take as instructed (Patient not taking: Reported on 05/21/2019) 21 tablet 0  . ondansetron (ZOFRAN) 4 MG tablet Take 1 tablet (4 mg total) by mouth every 8 (eight) hours as needed for nausea or vomiting. (Patient not taking: Reported on 05/21/2019) 10 tablet 0  . promethazine (PHENERGAN) 25 MG suppository Place 1 suppository (25 mg total) rectally every 6 (six) hours as needed for nausea or vomiting. (Patient not taking: Reported on 05/21/2019) 12 each 0   Facility-Administered Medications Ordered in Other Encounters   Medication Dose Route Frequency Provider Last Rate Last Dose  . magnesium hydroxide (MILK OF MAGNESIA) suspension 30 mL  30 mL Oral Daily PRN Nwoko, Agnes I, NP       PTA Medications: (Not in a hospital admission)   Musculoskeletal: Strength & Muscle Tone: within normal limits Gait & Station: normal Patient leans: N/A  Psychiatric Specialty Exam: Physical Exam  Constitutional: He appears well-developed and well-nourished.  HENT:  Head: Normocephalic.  Respiratory: Effort normal.  Musculoskeletal: Normal range of motion.  Neurological: He is alert.  Psychiatric: His speech is normal and behavior is normal. Judgment normal. His mood appears anxious. Thought content is paranoid. Cognition and memory are normal.      Review of Systems  All other systems reviewed and are negative.   Blood pressure 127/81, pulse 71, temperature 98.4 F (36.9 C), temperature source Oral, resp. rate 18, SpO2 97 %.There is no height or weight on file to calculate BMI.  General Appearance: Casual, dressed in purple scrubs  Eye Contact:  Good  Speech:  Clear and Coherent  Volume:  Normal  Mood:  Depressed  Affect:  Congruent and Depressed  Thought Process:  Coherent, Linear and Descriptions of Associations: Intact  Orientation:  Full (Time, Place, and Person)  Thought Content:  Logical  Suicidal Thoughts:  No, able to contract for safety  Homicidal Thoughts:  No, denies  Memory:  Immediate;   Fair Recent;   Fair Remote;   Fair  Judgement:  Fair  Insight:  Shallow  Psychomotor Activity:  Normal  Concentration:  Concentration: Good and Attention Span: Good  Recall:  Good  Fund of Knowledge:  Good  Language:  Good  Akathisia:  Negative  Handed:  Right  AIMS (if indicated):     Assets:  Communication Skills Financial Resources/Insurance Housing Social Support  ADL's:  Intact  Cognition:  WNL  Sleep:        Demographic Factors:  Male, Low socioeconomic status and Unemployed  Loss  Factors: Financial problems/change in socioeconomic status  Historical Factors: Family history of mental illness or substance abuse  Risk Reduction Factors:   Sense of responsibility to family  Continued Clinical Symptoms:  Alcohol/Substance Abuse/Dependencies Schizophrenia:   Paranoid or undifferentiated type  Cognitive Features That Contribute To Risk:  Closed-mindedness    Suicide Risk:  Minimal: No identifiable suicidal ideation.  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  Keep all follow-up appointments as scheduled.  Do not consume alcohol or use illegal drugs while on prescription medications. Report any adverse effects from your medications to your primary care provider promptly.  In the event of recurrent symptoms or worsening symptoms, call 911, a crisis hotline, or go to the nearest emergency department for evaluation.   Laurie Britton Parks, NP 05/22/2019, 12:14 PM  Patient seen face-to-face for psychiatric evaluation, chart reviewed and case discussed with the physician extender and developed treatment plan. Reviewed the information documented and agree with the treatment plan.  , MD 

## 2019-05-22 NOTE — Discharge Instructions (Signed)
For your behavioral health needs, you are advised to continue treatment with the [facility]:       [facility]      [address]      [place], [place] [address]      [phone]

## 2019-05-22 NOTE — ED Notes (Signed)
Pt showered and bedding was changed.

## 2019-07-02 ENCOUNTER — Emergency Department (HOSPITAL_COMMUNITY)
Admission: EM | Admit: 2019-07-02 | Discharge: 2019-07-03 | Disposition: A | Discharge status: Home / Self Care | Payer: Medicare Other | Attending: Emergency Medicine | Admitting: Emergency Medicine

## 2019-07-02 ENCOUNTER — Other Ambulatory Visit: Payer: Self-pay

## 2019-07-02 ENCOUNTER — Encounter (HOSPITAL_COMMUNITY): Payer: Self-pay | Admitting: Emergency Medicine

## 2019-07-02 DIAGNOSIS — S82891A Other fracture of right lower leg, initial encounter for closed fracture: Secondary | ICD-10-CM | POA: Insufficient documentation

## 2019-07-02 DIAGNOSIS — Y92513 Shop (commercial) as the place of occurrence of the external cause: Secondary | ICD-10-CM | POA: Insufficient documentation

## 2019-07-02 DIAGNOSIS — Y999 Unspecified external cause status: Secondary | ICD-10-CM | POA: Diagnosis not present

## 2019-07-02 DIAGNOSIS — M542 Cervicalgia: Secondary | ICD-10-CM | POA: Diagnosis not present

## 2019-07-02 DIAGNOSIS — F17210 Nicotine dependence, cigarettes, uncomplicated: Secondary | ICD-10-CM | POA: Diagnosis not present

## 2019-07-02 DIAGNOSIS — Y939 Activity, unspecified: Secondary | ICD-10-CM | POA: Insufficient documentation

## 2019-07-02 DIAGNOSIS — Z79899 Other long term (current) drug therapy: Secondary | ICD-10-CM | POA: Insufficient documentation

## 2019-07-02 DIAGNOSIS — S99911A Unspecified injury of right ankle, initial encounter: Secondary | ICD-10-CM | POA: Diagnosis present

## 2019-07-02 DIAGNOSIS — F10920 Alcohol use, unspecified with intoxication, uncomplicated: Secondary | ICD-10-CM | POA: Diagnosis not present

## 2019-07-02 DIAGNOSIS — S0003XA Contusion of scalp, initial encounter: Secondary | ICD-10-CM | POA: Diagnosis not present

## 2019-07-02 DIAGNOSIS — R51 Headache: Secondary | ICD-10-CM | POA: Diagnosis not present

## 2019-07-02 DIAGNOSIS — S14109A Unspecified injury at unspecified level of cervical spinal cord, initial encounter: Secondary | ICD-10-CM | POA: Diagnosis not present

## 2019-07-02 DIAGNOSIS — S82851A Displaced trimalleolar fracture of right lower leg, initial encounter for closed fracture: Secondary | ICD-10-CM | POA: Diagnosis not present
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
## 2019-07-03 ENCOUNTER — Emergency Department (HOSPITAL_COMMUNITY): Payer: Medicare Other

## 2019-07-03 DIAGNOSIS — S14109A Unspecified injury at unspecified level of cervical spinal cord, initial encounter: Secondary | ICD-10-CM | POA: Diagnosis not present

## 2019-07-03 DIAGNOSIS — S82851A Displaced trimalleolar fracture of right lower leg, initial encounter for closed fracture: Secondary | ICD-10-CM | POA: Diagnosis not present

## 2019-07-03 DIAGNOSIS — S82891A Other fracture of right lower leg, initial encounter for closed fracture: Secondary | ICD-10-CM | POA: Diagnosis not present

## 2019-07-03 DIAGNOSIS — S0003XA Contusion of scalp, initial encounter: Secondary | ICD-10-CM | POA: Diagnosis not present

## 2019-07-03 MED ORDER — MORPHINE SULFATE 15 MG PO TABS: 15.0000 mg | ORAL_TABLET | ORAL | 0 refills | Status: DC | PRN

## 2019-07-03 MED ORDER — KETOROLAC TROMETHAMINE 60 MG/2ML IM SOLN
15.0000 mg | Freq: Once | INTRAMUSCULAR | Status: AC
  Administered 2019-07-03: 15 mg via INTRAMUSCULAR
  Filled 2019-07-03: qty 2

## 2019-07-03 MED ORDER — OXYCODONE HCL 5 MG PO TABS
5.0000 mg | ORAL_TABLET | Freq: Once | ORAL | Status: AC
  Administered 2019-07-03: 5 mg via ORAL
  Filled 2019-07-03: qty 1

## 2019-07-03 MED ORDER — ACETAMINOPHEN 500 MG PO TABS
1000.0000 mg | ORAL_TABLET | Freq: Once | ORAL | Status: AC
  Administered 2019-07-03: 02:00:00 1000 mg via ORAL
  Filled 2019-07-03: qty 2

## 2019-07-03 NOTE — ED Notes (Signed)
Pt ambulating well with crutches

## 2019-07-03 NOTE — ED Notes (Signed)
Mother called to pick pt up

## 2019-07-03 NOTE — Discharge Instructions (Signed)
Take 4 over the counter ibuprofen tablets 3 times a day or 2 over-the-counter naproxen tablets twice a day for pain. Also take tylenol 1054m(2 extra strength) four times a day.   Then take the pain medicine if you feel like you need it. Narcotics do not help with the pain, they only make you care about it less.  You can become addicted to this, people may break into your house to steal it.  It will constipate you.  If you drive under the influence of this medicine you can get a DUI.    Try to keep your leg elevated.  Follow up with ortho in the office

## 2019-07-03 NOTE — Progress Notes (Signed)
Orthopedic Tech Progress Note Patient Details:  [name] 1981-08-20 [id number]  Ortho Devices Type of Ortho Device: Short leg splint Ortho Device/Splint Interventions: Adjustment, Application, Ordered   Post Interventions Patient Tolerated: Well   Melony Overly T 07/03/2019, 12:43 AM

## 2019-07-03 NOTE — ED Notes (Signed)
Discharge instructions discussed with pt. Pt verbalized understanding. Pt stable and ambulatory. No signature pad available. 

## 2019-07-04 DIAGNOSIS — M25571 Pain in right ankle and joints of right foot: Secondary | ICD-10-CM | POA: Diagnosis not present

## 2019-07-05 ENCOUNTER — Other Ambulatory Visit (HOSPITAL_COMMUNITY)
Admission: RE | Admit: 2019-07-05 | Discharge: 2019-07-05 | Disposition: A | Discharge status: Home / Self Care | Payer: Medicare Other | Source: Ambulatory Visit | Attending: Orthopaedic Surgery | Admitting: Orthopaedic Surgery

## 2019-07-05 DIAGNOSIS — Z01812 Encounter for preprocedural laboratory examination: Secondary | ICD-10-CM | POA: Diagnosis not present

## 2019-07-05 DIAGNOSIS — Z20828 Contact with and (suspected) exposure to other viral communicable diseases: Secondary | ICD-10-CM | POA: Diagnosis not present

## 2019-07-05 DIAGNOSIS — S82841A Displaced bimalleolar fracture of right lower leg, initial encounter for closed fracture: Secondary | ICD-10-CM | POA: Insufficient documentation

## 2019-07-06 LAB — NOVEL CORONAVIRUS, NAA (HOSP ORDER, SEND-OUT TO REF LAB; TAT 18-24 HRS): SARS-CoV-2, NAA: NOT DETECTED

## 2019-07-08 ENCOUNTER — Encounter (HOSPITAL_COMMUNITY): Payer: Self-pay | Admitting: *Deleted

## 2019-07-08 ENCOUNTER — Other Ambulatory Visit: Payer: Self-pay

## 2019-07-08 NOTE — Anesthesia Preprocedure Evaluation (Addendum)
Anesthesia Evaluation  Patient identified by MRN, date of birth, ID band Patient awake    Reviewed: Allergy & Precautions, NPO status , Patient's Chart, lab work & pertinent test results  Airway Mallampati: II  TM Distance: >3 FB Neck ROM: Full    Dental  (+) Poor Dentition   Pulmonary Current Smoker and Patient abstained from smoking.,    Pulmonary exam normal breath sounds clear to auscultation       Cardiovascular negative cardio ROS Normal cardiovascular exam Rhythm:Regular Rate:Normal     Neuro/Psych PSYCHIATRIC DISORDERS Bipolar Disorder Schizophrenia negative neurological ROS     GI/Hepatic PUD, (+)     substance abuse  cocaine use, Hx/o Polysubstance abuse Crohn's disease Ulcerative colitis   Endo/Other  negative endocrine ROS  Renal/GU negative Renal ROS  negative genitourinary   Musculoskeletal Right bimalleolar ankle Fx   Abdominal   Peds  Hematology  (+) Sickle cell anemia and anemia , Hx/o Multiple Myeloma   Anesthesia Other Findings Day of surgery medications reviewed with the patient.  Reproductive/Obstetrics                            Anesthesia Physical Anesthesia Plan  ASA: III  Anesthesia Plan: General   Post-op Pain Management:  Regional for Post-op pain   Induction: Intravenous  PONV Risk Score and Plan: 2 and Ondansetron and Dexamethasone  Airway Management Planned: LMA  Additional Equipment:   Intra-op Plan:   Post-operative Plan: Extubation in OR  Informed Consent: I have reviewed the patients History and Physical, chart, labs and discussed the procedure including the risks, benefits and alternatives for the proposed anesthesia with the patient or authorized representative who has indicated his/her understanding and acceptance.     Dental advisory given  Plan Discussed with: CRNA and Surgeon  Anesthesia Plan Comments:        Anesthesia  Quick Evaluation

## 2019-07-08 NOTE — Progress Notes (Signed)
Pt denies SOB, chest pain, and being under the care of a cardiologist. Pt denies having a PCP.  Pt denies having an echo, stress test and cardiac cath. Pt denies recent labs. Pt made aware to stop taking Aspirin (unless otherwise advised by surgeon), vitamins, fish oil and herbal medications. Do not take any NSAIDs ie: Ibuprofen, Advil, Naproxen (Aleve), Motrin, BC and Goody Powder. Pt requested that nurse provide mother, Sunday Spillers,  with pre-op instructions. Mother verbalized understanding of all pre-op instructions.

## 2019-07-09 ENCOUNTER — Encounter (HOSPITAL_COMMUNITY)
Admission: RE | Disposition: A | Discharge status: Home / Self Care | Payer: Self-pay | Source: Home / Self Care | Attending: Orthopaedic Surgery

## 2019-07-09 ENCOUNTER — Ambulatory Visit (HOSPITAL_COMMUNITY)
Admission: RE | Admit: 2019-07-09 | Discharge: 2019-07-09 | Disposition: A | Discharge status: Home / Self Care | Payer: Medicare Other | Attending: Orthopaedic Surgery | Admitting: Orthopaedic Surgery

## 2019-07-09 ENCOUNTER — Other Ambulatory Visit: Payer: Self-pay

## 2019-07-09 ENCOUNTER — Ambulatory Visit (HOSPITAL_COMMUNITY): Payer: Medicare Other | Admitting: Anesthesiology

## 2019-07-09 ENCOUNTER — Encounter (HOSPITAL_COMMUNITY): Payer: Self-pay

## 2019-07-09 DIAGNOSIS — S82851A Displaced trimalleolar fracture of right lower leg, initial encounter for closed fracture: Secondary | ICD-10-CM | POA: Diagnosis not present

## 2019-07-09 DIAGNOSIS — D571 Sickle-cell disease without crisis: Secondary | ICD-10-CM | POA: Diagnosis not present

## 2019-07-09 DIAGNOSIS — Z9119 Patient's noncompliance with other medical treatment and regimen: Secondary | ICD-10-CM | POA: Diagnosis not present

## 2019-07-09 DIAGNOSIS — Y9301 Activity, walking, marching and hiking: Secondary | ICD-10-CM | POA: Diagnosis not present

## 2019-07-09 DIAGNOSIS — Z792 Long term (current) use of antibiotics: Secondary | ICD-10-CM | POA: Diagnosis not present

## 2019-07-09 DIAGNOSIS — S93431A Sprain of tibiofibular ligament of right ankle, initial encounter: Secondary | ICD-10-CM | POA: Insufficient documentation

## 2019-07-09 DIAGNOSIS — X58XXXA Exposure to other specified factors, initial encounter: Secondary | ICD-10-CM | POA: Diagnosis not present

## 2019-07-09 DIAGNOSIS — F209 Schizophrenia, unspecified: Secondary | ICD-10-CM | POA: Insufficient documentation

## 2019-07-09 DIAGNOSIS — Z79899 Other long term (current) drug therapy: Secondary | ICD-10-CM | POA: Diagnosis not present

## 2019-07-09 DIAGNOSIS — Z888 Allergy status to other drugs, medicaments and biological substances status: Secondary | ICD-10-CM | POA: Insufficient documentation

## 2019-07-09 DIAGNOSIS — F17210 Nicotine dependence, cigarettes, uncomplicated: Secondary | ICD-10-CM | POA: Diagnosis not present

## 2019-07-09 DIAGNOSIS — Z886 Allergy status to analgesic agent status: Secondary | ICD-10-CM | POA: Insufficient documentation

## 2019-07-09 DIAGNOSIS — G8918 Other acute postprocedural pain: Secondary | ICD-10-CM | POA: Diagnosis not present

## 2019-07-09 DIAGNOSIS — S82841A Displaced bimalleolar fracture of right lower leg, initial encounter for closed fracture: Secondary | ICD-10-CM | POA: Diagnosis not present

## 2019-07-09 DIAGNOSIS — F200 Paranoid schizophrenia: Secondary | ICD-10-CM | POA: Diagnosis not present

## 2019-07-09 HISTORY — PX: ORIF ANKLE FRACTURE: SHX5408

## 2019-07-09 HISTORY — DX: Displaced bimalleolar fracture of right lower leg, initial encounter for closed fracture: S82.841A

## 2019-07-09 LAB — CBC
HCT: 43.5 % (ref 39.0–52.0)
Hemoglobin: 14.4 g/dL (ref 13.0–17.0)
MCH: 27.6 pg (ref 26.0–34.0)
MCHC: 33.1 g/dL (ref 30.0–36.0)
MCV: 83.3 fL (ref 80.0–100.0)
Platelets: 418 10*3/uL — ABNORMAL HIGH (ref 150–400)
RBC: 5.22 MIL/uL (ref 4.22–5.81)
RDW: 16.3 % — ABNORMAL HIGH (ref 11.5–15.5)
WBC: 12 10*3/uL — ABNORMAL HIGH (ref 4.0–10.5)
nRBC: 0 % (ref 0.0–0.2)

## 2019-07-09 SURGERY — OPEN REDUCTION INTERNAL FIXATION (ORIF) ANKLE FRACTURE
Anesthesia: General | Site: Ankle | Laterality: Right

## 2019-07-09 MED ORDER — STERILE WATER FOR IRRIGATION IR SOLN
Status: DC | PRN
  Administered 2019-07-09: 1000 mL

## 2019-07-09 MED ORDER — FENTANYL CITRATE (PF) 100 MCG/2ML IJ SOLN
INTRAMUSCULAR | Status: DC | PRN
  Administered 2019-07-09: 100 ug via INTRAVENOUS
  Administered 2019-07-09: 25 ug via INTRAVENOUS

## 2019-07-09 MED ORDER — MIDAZOLAM HCL 2 MG/2ML IJ SOLN
INTRAMUSCULAR | Status: AC
  Filled 2019-07-09: qty 2

## 2019-07-09 MED ORDER — BUPIVACAINE-EPINEPHRINE (PF) 0.5% -1:200000 IJ SOLN
INTRAMUSCULAR | Status: DC | PRN
  Administered 2019-07-09: 30 mL via PERINEURAL

## 2019-07-09 MED ORDER — SODIUM CHLORIDE 0.9 % IV SOLN
INTRAVENOUS | Status: DC | PRN
  Administered 2019-07-09: 20 ug/min via INTRAVENOUS

## 2019-07-09 MED ORDER — ONDANSETRON HCL 4 MG/2ML IJ SOLN
INTRAMUSCULAR | Status: DC | PRN
  Administered 2019-07-09: 4 mg via INTRAVENOUS

## 2019-07-09 MED ORDER — PHENYLEPHRINE 40 MCG/ML (10ML) SYRINGE FOR IV PUSH (FOR BLOOD PRESSURE SUPPORT)
PREFILLED_SYRINGE | INTRAVENOUS | Status: DC | PRN
  Administered 2019-07-09: 40 ug via INTRAVENOUS
  Administered 2019-07-09 (×3): 80 ug via INTRAVENOUS
  Administered 2019-07-09: 120 ug via INTRAVENOUS
  Administered 2019-07-09: 80 ug via INTRAVENOUS

## 2019-07-09 MED ORDER — TOBRAMYCIN SULFATE 1.2 G IJ SOLR
INTRAMUSCULAR | Status: DC | PRN
  Administered 2019-07-09: 1.2 g via TOPICAL

## 2019-07-09 MED ORDER — BUPIVACAINE HCL (PF) 0.25 % IJ SOLN
INTRAMUSCULAR | Status: AC
  Filled 2019-07-09: qty 30

## 2019-07-09 MED ORDER — CEFAZOLIN SODIUM-DEXTROSE 2-4 GM/100ML-% IV SOLN
2.0000 g | INTRAVENOUS | Status: AC
  Administered 2019-07-09: 08:00:00 2 g via INTRAVENOUS
  Filled 2019-07-09: qty 100

## 2019-07-09 MED ORDER — DEXMEDETOMIDINE HCL IN NACL 200 MCG/50ML IV SOLN
INTRAVENOUS | Status: AC
  Filled 2019-07-09: qty 50

## 2019-07-09 MED ORDER — 0.9 % SODIUM CHLORIDE (POUR BTL) OPTIME
TOPICAL | Status: DC | PRN
  Administered 2019-07-09: 08:00:00 1000 mL

## 2019-07-09 MED ORDER — VANCOMYCIN HCL 1000 MG IV SOLR
INTRAVENOUS | Status: AC
  Filled 2019-07-09: qty 1000

## 2019-07-09 MED ORDER — ASPIRIN 81 MG PO CHEW: 81.0000 mg | CHEWABLE_TABLET | Freq: Two times a day (BID) | ORAL | 1 refills | Status: DC

## 2019-07-09 MED ORDER — OXYCODONE HCL 5 MG PO TABS: ORAL_TABLET | ORAL | 0 refills | Status: AC

## 2019-07-09 MED ORDER — CHLORHEXIDINE GLUCONATE 4 % EX LIQD: 60.0000 mL | Freq: Once | CUTANEOUS | Status: DC

## 2019-07-09 MED ORDER — LIDOCAINE 2% (20 MG/ML) 5 ML SYRINGE
INTRAMUSCULAR | Status: DC | PRN
  Administered 2019-07-09: 60 mg via INTRAVENOUS

## 2019-07-09 MED ORDER — PHENYLEPHRINE 40 MCG/ML (10ML) SYRINGE FOR IV PUSH (FOR BLOOD PRESSURE SUPPORT)
PREFILLED_SYRINGE | INTRAVENOUS | Status: AC
  Filled 2019-07-09: qty 10

## 2019-07-09 MED ORDER — FENTANYL CITRATE (PF) 250 MCG/5ML IJ SOLN
INTRAMUSCULAR | Status: AC
  Filled 2019-07-09: qty 5

## 2019-07-09 MED ORDER — ROPIVACAINE HCL 7.5 MG/ML IJ SOLN
INTRAMUSCULAR | Status: DC | PRN
  Administered 2019-07-09: 20 mL via PERINEURAL

## 2019-07-09 MED ORDER — TOBRAMYCIN SULFATE 1.2 G IJ SOLR
INTRAMUSCULAR | Status: AC
  Filled 2019-07-09: qty 1.2

## 2019-07-09 MED ORDER — ACETAMINOPHEN 500 MG PO TABS: 1000.0000 mg | ORAL_TABLET | Freq: Three times a day (TID) | ORAL | 0 refills | Status: AC

## 2019-07-09 MED ORDER — PROPOFOL 10 MG/ML IV BOLUS
INTRAVENOUS | Status: DC | PRN
  Administered 2019-07-09: 200 mg via INTRAVENOUS

## 2019-07-09 MED ORDER — LIDOCAINE 2% (20 MG/ML) 5 ML SYRINGE
INTRAMUSCULAR | Status: AC
  Filled 2019-07-09: qty 5

## 2019-07-09 MED ORDER — DEXAMETHASONE SODIUM PHOSPHATE 10 MG/ML IJ SOLN
INTRAMUSCULAR | Status: DC | PRN
  Administered 2019-07-09: 10 mg via INTRAVENOUS

## 2019-07-09 MED ORDER — DEXAMETHASONE SODIUM PHOSPHATE 10 MG/ML IJ SOLN
INTRAMUSCULAR | Status: AC
  Filled 2019-07-09: qty 1

## 2019-07-09 MED ORDER — LACTATED RINGERS IV SOLN
INTRAVENOUS | Status: DC | PRN
  Administered 2019-07-09 (×2): via INTRAVENOUS

## 2019-07-09 MED ORDER — PROPOFOL 10 MG/ML IV BOLUS
INTRAVENOUS | Status: AC
  Filled 2019-07-09: qty 40

## 2019-07-09 MED ORDER — ONDANSETRON HCL 4 MG PO TABS: 4.0000 mg | ORAL_TABLET | Freq: Three times a day (TID) | ORAL | 1 refills | Status: AC | PRN

## 2019-07-09 MED ORDER — DEXMEDETOMIDINE HCL IN NACL 200 MCG/50ML IV SOLN
INTRAVENOUS | Status: DC | PRN
  Administered 2019-07-09 (×8): 4 ug via INTRAVENOUS

## 2019-07-09 MED ORDER — ONDANSETRON HCL 4 MG/2ML IJ SOLN
INTRAMUSCULAR | Status: AC
  Filled 2019-07-09: qty 2

## 2019-07-09 MED ORDER — ACETAMINOPHEN 500 MG PO TABS
1000.0000 mg | ORAL_TABLET | Freq: Once | ORAL | Status: AC
  Administered 2019-07-09: 1000 mg via ORAL
  Filled 2019-07-09: qty 2

## 2019-07-09 MED ORDER — MIDAZOLAM HCL 5 MG/5ML IJ SOLN
INTRAMUSCULAR | Status: DC | PRN
  Administered 2019-07-09: 2 mg via INTRAVENOUS

## 2019-07-09 MED ORDER — VANCOMYCIN HCL 1000 MG IV SOLR
INTRAVENOUS | Status: DC | PRN
  Administered 2019-07-09: 1000 mg via TOPICAL

## 2019-07-09 SURGICAL SUPPLY — 79 items
APL SKNCLS STERI-STRIP NONHPOA (GAUZE/BANDAGES/DRESSINGS)
BENZOIN TINCTURE PRP APPL 2/3 (GAUZE/BANDAGES/DRESSINGS) IMPLANT
BIT DRILL 2 CANN GRADUATED (BIT) ×2 IMPLANT
BIT DRILL 2.5 CANN LNG (BIT) ×2 IMPLANT
BIT DRILL 2.6 CANN (BIT) ×2 IMPLANT
BLADE SURG 10 STRL SS (BLADE) ×3 IMPLANT
BLADE SURG 15 STRL LF DISP TIS (BLADE) ×2 IMPLANT
BLADE SURG 15 STRL SS (BLADE) ×6
BNDG COHESIVE 4X5 TAN STRL (GAUZE/BANDAGES/DRESSINGS) ×3 IMPLANT
BNDG ELASTIC 4X5.8 VLCR STR LF (GAUZE/BANDAGES/DRESSINGS) ×3 IMPLANT
BNDG ELASTIC 6X5.8 VLCR STR LF (GAUZE/BANDAGES/DRESSINGS) ×3 IMPLANT
CLOSURE STERI-STRIP 1/2X4 (GAUZE/BANDAGES/DRESSINGS) ×1
CLOSURE WOUND 1/2 X4 (GAUZE/BANDAGES/DRESSINGS) ×1
CLSR STERI-STRIP ANTIMIC 1/2X4 (GAUZE/BANDAGES/DRESSINGS) ×1 IMPLANT
COVER WAND RF STERILE (DRAPES) IMPLANT
CUFF TOURN SGL QUICK 34 (TOURNIQUET CUFF)
CUFF TRNQT CYL 34X4.125X (TOURNIQUET CUFF) IMPLANT
DECANTER SPIKE VIAL GLASS SM (MISCELLANEOUS) IMPLANT
DRAPE INCISE IOBAN 66X45 STRL (DRAPES) ×3 IMPLANT
DRAPE OEC MINIVIEW 54X84 (DRAPES) ×3 IMPLANT
DRAPE U-SHAPE 47X51 STRL (DRAPES) ×3 IMPLANT
DRSG PAD ABDOMINAL 8X10 ST (GAUZE/BANDAGES/DRESSINGS) ×3 IMPLANT
DURAPREP 26ML APPLICATOR (WOUND CARE) ×3 IMPLANT
ELECT REM PT RETURN 9FT ADLT (ELECTROSURGICAL) ×3
ELECTRODE REM PT RTRN 9FT ADLT (ELECTROSURGICAL) ×1 IMPLANT
GAUZE SPONGE 4X4 12PLY STRL (GAUZE/BANDAGES/DRESSINGS) ×3 IMPLANT
GLOVE BIO SURGEON STRL SZ8 (GLOVE) ×3 IMPLANT
GLOVE BIOGEL PI IND STRL 8 (GLOVE) ×1 IMPLANT
GLOVE BIOGEL PI INDICATOR 8 (GLOVE) ×2
GLOVE ECLIPSE 8.0 STRL XLNG CF (GLOVE) ×3 IMPLANT
GOWN STRL REUS W/ TWL LRG LVL3 (GOWN DISPOSABLE) ×1 IMPLANT
GOWN STRL REUS W/ TWL XL LVL3 (GOWN DISPOSABLE) ×1 IMPLANT
GOWN STRL REUS W/TWL LRG LVL3 (GOWN DISPOSABLE) ×3
GOWN STRL REUS W/TWL XL LVL3 (GOWN DISPOSABLE) ×3
GUIDEWIRE 1.35MM (WIRE) ×4 IMPLANT
GUIDEWIRE 1.6 (WIRE) ×3
GUIDEWIRE ORTH 157X1.6XTROC (WIRE) IMPLANT
KIT BASIN OR (CUSTOM PROCEDURE TRAY) ×3 IMPLANT
NEEDLE HYPO 22GX1.5 SAFETY (NEEDLE) IMPLANT
NS IRRIG 1000ML POUR BTL (IV SOLUTION) ×3 IMPLANT
PACK ORTHO EXTREMITY (CUSTOM PROCEDURE TRAY) ×3 IMPLANT
PAD ABD 8X10 STRL (GAUZE/BANDAGES/DRESSINGS) ×4 IMPLANT
PAD CAST 4YDX4 CTTN HI CHSV (CAST SUPPLIES) ×1 IMPLANT
PADDING CAST COTTON 4X4 STRL (CAST SUPPLIES) ×3
PADDING CAST COTTON 6X4 STRL (CAST SUPPLIES) ×3 IMPLANT
PLATE DST LCK RT H4 (Plate) ×2 IMPLANT
SCREW BN T10 FT 20X2.7XST CORT (Screw) IMPLANT
SCREW CORT 2.7X20 (Screw) ×3 IMPLANT
SCREW CORTEX 2.7X24MM LP SS (Screw) ×2 IMPLANT
SCREW LOCK T10 FT 18X2.7X (Screw) IMPLANT
SCREW LOCKING 2.7X14MM (Screw) ×2 IMPLANT
SCREW LOCKING 2.7X16MM (Screw) ×4 IMPLANT
SCREW LOCKING 2.7X18MM (Screw) ×3 IMPLANT
SCREW LOCKING LP 2.7X20MM (Screw) ×2 IMPLANT
SCREW LOW PROFILE 3.5X14 (Screw) ×2 IMPLANT
SCREW LOW PROFILE 3.5X16 (Screw) ×4 IMPLANT
SCREW LP CANN 4.0X45MM (Screw) ×4 IMPLANT
SCREW NLOCK 26X2.7XLOPRFL (Screw) IMPLANT
SCREW NONLOCK 2.7X26MM (Screw) ×3 IMPLANT
SLEEVE SCD COMPRESS KNEE MED (MISCELLANEOUS) IMPLANT
SPLINT PLASTER CAST XFAST 5X30 (CAST SUPPLIES) IMPLANT
SPLINT PLASTER XFAST SET 5X30 (CAST SUPPLIES) ×4
STRIP CLOSURE SKIN 1/2X4 (GAUZE/BANDAGES/DRESSINGS) ×2 IMPLANT
SUCTION FRAZIER HANDLE 10FR (MISCELLANEOUS) ×2
SUCTION TUBE FRAZIER 10FR DISP (MISCELLANEOUS) ×1 IMPLANT
SUT MNCRL AB 4-0 PS2 18 (SUTURE) IMPLANT
SUT MON AB 3-0 SH 27 (SUTURE)
SUT MON AB 3-0 SH27 (SUTURE) IMPLANT
SUT VIC AB 0 CT1 27 (SUTURE) ×3
SUT VIC AB 0 CT1 27XBRD ANBCTR (SUTURE) ×1 IMPLANT
SUT VIC AB 0 CTB1 27 (SUTURE) ×2 IMPLANT
SUT VIC AB 3-0 SH 27 (SUTURE) ×9
SUT VIC AB 3-0 SH 27X BRD (SUTURE) ×1 IMPLANT
SYR CONTROL 10ML LL (SYRINGE) IMPLANT
TOWEL GREEN STERILE FF (TOWEL DISPOSABLE) ×6 IMPLANT
TUBE CONNECTING 20'X1/4 (TUBING) ×1
TUBE CONNECTING 20X1/4 (TUBING) ×2 IMPLANT
UNDERPAD 30X30 (UNDERPADS AND DIAPERS) ×3 IMPLANT
YANKAUER SUCT BULB TIP NO VENT (SUCTIONS) ×3 IMPLANT

## 2019-07-09 NOTE — Transfer of Care (Signed)
Immediate Anesthesia Transfer of Care Note  Patient: [name]  Procedure(s) Performed: OPEN REDUCTION INTERNAL FIXATION ANKLE FRACTURE (Right Ankle)  Patient Location: PACU  Anesthesia Type:GA combined with regional for post-op pain  Level of Consciousness: awake, alert  and oriented  Airway & Oxygen Therapy: Patient Spontanous Breathing and Patient connected to face mask oxygen  Post-op Assessment: Report given to RN and Post -op Vital signs reviewed and stable  Post vital signs: Reviewed and stable  Last Vitals:  Vitals Value Taken Time  BP 121/77 07/09/19 1023  Temp 36.1 C 07/09/19 1023  Pulse 87 07/09/19 1023  Resp 12 07/09/19 1023  SpO2 100 % 07/09/19 1023  Vitals shown include unvalidated device data.  Last Pain:  Vitals:   07/09/19 1023  TempSrc:   PainSc: 0-No pain         Complications: No apparent anesthesia complications

## 2019-07-09 NOTE — H&P (Signed)
PREOPERATIVE H&P  Chief Complaint: RIGHT ANKLE FRACTURE DISPLACED BIMALEOLAR FRACTRUE LOW LOWER LEG  HPI: [name] is a 38 y.o. male who presents for preoperative history and physical with a diagnosis of RIGHT ANKLE FRACTURE DISPLACED [place]. Symptoms are rated as moderate to severe, and have been worsening.  This is significantly impairing activities of daily living.  Please see my clinic note for full details on this patient's care.  He has elected for surgical management.   Past Medical History:  Diagnosis Date  . Ankle fracture, bimalleolar, closed, right, initial encounter   . Crohn's disease ([name])   . GI (gastrointestinal bleed) 04/2013  . Multiple myeloma ([name])   . Schizophrenia ([name])   . Sickle cell anemia (HCC)   . Suicide attempt by drug ingestion ([name])   . Ulcerative colitis    Past Surgical History:  Procedure Laterality Date  . ESOPHAGOGASTRODUODENOSCOPY N/A 05/24/2013   Procedure: ESOPHAGOGASTRODUODENOSCOPY (EGD);  Surgeon: Ladene Artist, MD;  Location: [facility] ENDOSCOPY;  Service: Endoscopy;  Laterality: N/A;  . FLEXIBLE SIGMOIDOSCOPY  12/05/2011   Procedure: FLEXIBLE SIGMOIDOSCOPY;  Surgeon: Beryle Beams, MD;  Location: [facility] ENDOSCOPY;  Service: Endoscopy;  Laterality: N/A;  . MANDIBLE FRACTURE SURGERY     Social History   Socioeconomic History  . Marital status: Single    Spouse name: Not on file  . Number of children: Not on file  . Years of education: GED  . Highest education level: Not on file  Occupational History  . Occupation: disabled  Social Needs  . Financial resource strain: Not on file  . Food insecurity    Worry: Not on file    Inability: Not on file  . Transportation needs    Medical: Not on file    Non-medical: Not on file  Tobacco Use  . Smoking status: Current Some Day Smoker    Packs/day: 0.25    Years: 20.00    Pack years: 5.00    Types: Cigarettes  . Smokeless tobacco: Never Used  Substance and Sexual  Activity  . Alcohol use: Yes    Comment: occasional  . Drug use: Yes    Types: Cocaine, Marijuana    Comment: +THC; +COC per chart  . Sexual activity: Not on file  Lifestyle  . Physical activity    Days per week: Not on file    Minutes per session: Not on file  . Stress: Not on file  Relationships  . Social Herbalist on phone: Not on file    Gets together: Not on file    Attends religious service: Not on file    Active member of club or organization: Not on file    Attends meetings of clubs or organizations: Not on file    Relationship status: Not on file  Other Topics Concern  . Not on file  Social History Narrative   Pt lives with mother in [name].  He is disabled.   Family History  Problem Relation Age of Onset  . Liver cancer Other    Allergies  Allergen Reactions  . Depakote [Divalproex Sodium] Other (See Comments)    Reaction:  Seizures   . Lithium Diarrhea and Other (See Comments)    Reaction:  Rectal bleeding   . Nsaids Itching and Other (See Comments)    Reaction:  Rectal bleeding   . Risperdal [Risperidone] Shortness Of Breath and Other (See Comments)    Chest pain   Prior to  Admission medications   Medication Sig Start Date End Date Taking? Authorizing Provider  amoxicillin-clavulanate (AUGMENTIN) 875-125 MG tablet Take 1 tablet by mouth every 12 (twelve) hours. 04/07/19  Yes Valarie Merino, MD  benztropine (COGENTIN) 1 MG tablet Take 1 mg by mouth at bedtime.    Yes [provider]  dicyclomine (BENTYL) 20 MG tablet Take 1 tablet (20 mg total) by mouth 2 (two) times daily. 03/16/19  Yes Harris, Abigail, PA-C  haloperidol (HALDOL) 10 MG tablet Take 10 mg by mouth at bedtime.  02/26/18  Yes [provider]  ondansetron (ZOFRAN) 4 MG tablet Take 1 tablet (4 mg total) by mouth every 8 (eight) hours as needed for nausea or vomiting. 03/16/19  Yes Harris, Abigail, PA-C  haloperidol decanoate (HALDOL DECANOATE) 100 MG/ML injection  Inject 100 mg into the muscle every 28 (twenty-eight) days. 02/14/18   [provider]  methylPREDNISolone (MEDROL DOSEPAK) 4 MG TBPK tablet Take as instructed Patient not taking: Reported on 05/21/2019 04/07/19   Sherwood Gambler, MD  morphine (MSIR) 15 MG tablet Take 1 tablet (15 mg total) by mouth every 4 (four) hours as needed for severe pain. 07/03/19   Deno Etienne, DO  promethazine (PHENERGAN) 25 MG suppository Place 1 suppository (25 mg total) rectally every 6 (six) hours as needed for nausea or vomiting. Patient not taking: Reported on 05/21/2019 03/19/19   Larene Pickett, PA-C     Positive ROS: All other systems have been reviewed and were otherwise negative with the exception of those mentioned in the HPI and as above.  Physical Exam: General: Alert, no acute distress Cardiovascular: No pedal edema Respiratory: No cyanosis, no use of accessory musculature GI: No organomegaly, abdomen is soft and non-tender Skin: No lesions in the area of chief complaint Neurologic: Sensation intact distally Psychiatric: Patient is competent for consent with normal mood and affect Lymphatic: No axillary or cervical lymphadenopathy  MUSCULOSKELETAL: RLE: splint in place, wiggling toes, NVID  Assessment: RIGHT ANKLE FRACTURE DISPLACED BIMALEOLAR FRACTRUE LOW LOWER LEG  Plan: Plan for Procedure(s): OPEN REDUCTION INTERNAL FIXATION (ORIF) ANKLE FRACTURE  The risks benefits and alternatives were discussed with the patient including but not limited to the risks of nonoperative treatment, versus surgical intervention including infection, bleeding, nerve injury,  blood clots, cardiopulmonary complications, morbidity, mortality, among others, and they were willing to proceed.   Hiram Gash, MD  07/09/2019 7:18 AM

## 2019-07-09 NOTE — Anesthesia Procedure Notes (Signed)
Anesthesia Regional Block: Popliteal block   Pre-Anesthetic Checklist: ,, timeout performed, Correct Patient, Correct Site, Correct Laterality, Correct Procedure, Correct Position, site marked, Risks and benefits discussed,  Surgical consent,  Pre-op evaluation,  At surgeon's request and post-op pain management  Laterality: Right  Prep: chloraprep       Needles:  Injection technique: Single-shot  Needle Type: Echogenic Stimulator Needle     Needle Length: 9cm  Needle Gauge: 21   Needle insertion depth: 7 cm   Additional Needles:   Procedures:,,,, ultrasound used (permanent image in chart),,,,  Narrative:  Start time: 07/09/2019 7:00 AM End time: 07/09/2019 7:04 AM Injection made incrementally with aspirations every 5 mL.  Performed by: Personally  Anesthesiologist: Josephine Igo, MD  Additional Notes: Timeout performed. Patient sedated. Relevant anatomy ID'd using [place]. Incremental 2-40m injection of LA with frequent aspiration. Patient tolerated procedure well.        Right Popliteal Block

## 2019-07-09 NOTE — Anesthesia Procedure Notes (Signed)
Procedure Name: LMA Insertion Date/Time: 07/09/2019 7:36 AM Performed by: Genelle Bal, CRNA Pre-anesthesia Checklist: Patient identified, Emergency Drugs available, Suction available and Patient being monitored Patient Re-evaluated:Patient Re-evaluated prior to induction Oxygen Delivery Method: Circle system utilized Preoxygenation: Pre-oxygenation with 100% oxygen Induction Type: IV induction Ventilation: Mask ventilation without difficulty LMA: LMA inserted LMA Size: 5.0 Number of attempts: 1 Airway Equipment and Method: Bite block Placement Confirmation: positive ETCO2 Tube secured with: Tape Dental Injury: Teeth and Oropharynx as per pre-operative assessment

## 2019-07-09 NOTE — Anesthesia Procedure Notes (Addendum)
Anesthesia Regional Block: Adductor canal block   Pre-Anesthetic Checklist: ,, timeout performed, Correct Patient, Correct Site, Correct Laterality, Correct Procedure, Correct Position, site marked, Risks and benefits discussed,  Surgical consent,  Pre-op evaluation,  At surgeon's request and post-op pain management  Laterality: Right  Prep: chloraprep       Needles:  Injection technique: Single-shot  Needle Type: Echogenic Stimulator Needle     Needle Length: 9cm  Needle Gauge: 21   Needle insertion depth: 7 cm   Additional Needles:   Procedures:,,,, ultrasound used (permanent image in chart),,,,  Narrative:  Start time: 07/09/2019 7:04 AM End time: 07/09/2019 7:08 AM Injection made incrementally with aspirations every 5 mL.  Performed by: Personally  Anesthesiologist: Josephine Igo, MD  Additional Notes: Timeout performed. Patient sedated. Relevant anatomy ID'd using [place]. Incremental 2-67m injection of LA with frequent aspiration. Patient tolerated procedure well.        Right Adductor Canal Block
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
## 2019-07-09 NOTE — Anesthesia Postprocedure Evaluation (Signed)
Anesthesia Post Note  Patient: [name]  Procedure(s) Performed: OPEN REDUCTION INTERNAL FIXATION ANKLE FRACTURE (Right Ankle)     Patient location during evaluation: PACU Anesthesia Type: General Level of consciousness: awake and alert Pain management: pain level controlled Vital Signs Assessment: post-procedure vital signs reviewed and stable Respiratory status: spontaneous breathing, nonlabored ventilation and respiratory function stable Cardiovascular status: blood pressure returned to baseline and stable Postop Assessment: no apparent nausea or vomiting Anesthetic complications: no    Last Vitals:  Vitals:   07/09/19 0955 07/09/19 1000  BP: 110/73   Pulse: 86 86  Resp: 19 20  Temp:    SpO2: 98% 98%    Last Pain:  Vitals:   07/09/19 1000  TempSrc:   PainSc: 0-No pain        RLE Motor Response: Purposeful movement (07/09/19 1000) RLE Sensation: No sensation (absent) (07/09/19 1000)      Abbrielle Batts A.

## 2019-07-10 ENCOUNTER — Encounter (HOSPITAL_COMMUNITY): Payer: Self-pay | Admitting: Orthopaedic Surgery

## 2019-07-17 DIAGNOSIS — S82841D Displaced bimalleolar fracture of right lower leg, subsequent encounter for closed fracture with routine healing: Secondary | ICD-10-CM | POA: Diagnosis not present

## 2019-08-01 DIAGNOSIS — S82841D Displaced bimalleolar fracture of right lower leg, subsequent encounter for closed fracture with routine healing: Secondary | ICD-10-CM | POA: Diagnosis not present

## 2019-08-22 DIAGNOSIS — S82841D Displaced bimalleolar fracture of right lower leg, subsequent encounter for closed fracture with routine healing: Secondary | ICD-10-CM | POA: Diagnosis not present

## 2019-09-19 DIAGNOSIS — S82841D Displaced bimalleolar fracture of right lower leg, subsequent encounter for closed fracture with routine healing: Secondary | ICD-10-CM | POA: Diagnosis not present

## 2019-12-30 ENCOUNTER — Emergency Department (HOSPITAL_COMMUNITY)
Admission: EM | Admit: 2019-12-30 | Discharge: 2019-12-30 | Disposition: A | Discharge status: Home / Self Care | Payer: Medicare Other | Attending: Emergency Medicine | Admitting: Emergency Medicine

## 2019-12-30 ENCOUNTER — Emergency Department (HOSPITAL_COMMUNITY): Payer: Medicare Other

## 2019-12-30 ENCOUNTER — Other Ambulatory Visit: Payer: Self-pay

## 2019-12-30 ENCOUNTER — Encounter (HOSPITAL_COMMUNITY): Payer: Self-pay | Admitting: Emergency Medicine

## 2019-12-30 DIAGNOSIS — F17210 Nicotine dependence, cigarettes, uncomplicated: Secondary | ICD-10-CM | POA: Diagnosis not present

## 2019-12-30 DIAGNOSIS — Z79899 Other long term (current) drug therapy: Secondary | ICD-10-CM | POA: Diagnosis not present

## 2019-12-30 DIAGNOSIS — R05 Cough: Secondary | ICD-10-CM | POA: Diagnosis not present

## 2019-12-30 DIAGNOSIS — M791 Myalgia, unspecified site: Secondary | ICD-10-CM | POA: Insufficient documentation

## 2019-12-30 DIAGNOSIS — R5383 Other fatigue: Secondary | ICD-10-CM | POA: Diagnosis not present

## 2019-12-30 DIAGNOSIS — R509 Fever, unspecified: Secondary | ICD-10-CM | POA: Diagnosis not present

## 2019-12-30 DIAGNOSIS — Z20822 Contact with and (suspected) exposure to covid-19: Secondary | ICD-10-CM | POA: Insufficient documentation

## 2019-12-30 DIAGNOSIS — R0602 Shortness of breath: Secondary | ICD-10-CM | POA: Diagnosis not present

## 2019-12-30 LAB — URINALYSIS, ROUTINE W REFLEX MICROSCOPIC
Bilirubin Urine: NEGATIVE
Glucose, UA: NEGATIVE mg/dL
Hgb urine dipstick: NEGATIVE
Ketones, ur: NEGATIVE mg/dL
Leukocytes,Ua: NEGATIVE
Nitrite: NEGATIVE
Protein, ur: NEGATIVE mg/dL
Specific Gravity, Urine: 1.006 (ref 1.005–1.030)
pH: 8 (ref 5.0–8.0)

## 2019-12-30 LAB — CBC WITH DIFFERENTIAL/PLATELET
Abs Immature Granulocytes: 0.03 10*3/uL (ref 0.00–0.07)
Basophils Absolute: 0.1 10*3/uL (ref 0.0–0.1)
Basophils Relative: 1 %
Eosinophils Absolute: 0.8 10*3/uL — ABNORMAL HIGH (ref 0.0–0.5)
Eosinophils Relative: 10 %
HCT: 45.4 % (ref 39.0–52.0)
Hemoglobin: 13.8 g/dL (ref 13.0–17.0)
Immature Granulocytes: 0 %
Lymphocytes Relative: 25 %
Lymphs Abs: 2 10*3/uL (ref 0.7–4.0)
MCH: 26.4 pg (ref 26.0–34.0)
MCHC: 30.4 g/dL (ref 30.0–36.0)
MCV: 87 fL (ref 80.0–100.0)
Monocytes Absolute: 0.8 10*3/uL (ref 0.1–1.0)
Monocytes Relative: 10 %
Neutro Abs: 4.3 10*3/uL (ref 1.7–7.7)
Neutrophils Relative %: 54 %
Platelets: 343 10*3/uL (ref 150–400)
RBC: 5.22 MIL/uL (ref 4.22–5.81)
RDW: 17.5 % — ABNORMAL HIGH (ref 11.5–15.5)
WBC: 8 10*3/uL (ref 4.0–10.5)
nRBC: 0 % (ref 0.0–0.2)

## 2019-12-30 LAB — COMPREHENSIVE METABOLIC PANEL
ALT: 24 U/L (ref 0–44)
AST: 25 U/L (ref 15–41)
Albumin: 3.8 g/dL (ref 3.5–5.0)
Alkaline Phosphatase: 98 U/L (ref 38–126)
Anion gap: 11 (ref 5–15)
BUN: 13 mg/dL (ref 6–20)
CO2: 23 mmol/L (ref 22–32)
Calcium: 9 mg/dL (ref 8.9–10.3)
Chloride: 106 mmol/L (ref 98–111)
Creatinine, Ser: 0.86 mg/dL (ref 0.61–1.24)
GFR calc Af Amer: >60 mL/min (ref >60)
GFR calc non Af Amer: >60 mL/min (ref >60)
Glucose, Bld: 107 mg/dL — ABNORMAL HIGH (ref 70–99)
Potassium: 4.1 mmol/L (ref 3.5–5.1)
Sodium: 140 mmol/L (ref 135–145)
Total Bilirubin: 0.7 mg/dL (ref 0.3–1.2)
Total Protein: 7.6 g/dL (ref 6.5–8.1)

## 2019-12-30 LAB — SARS CORONAVIRUS 2 (TAT 6-24 HRS): SARS Coronavirus 2: NEGATIVE

## 2019-12-30 MED ORDER — ONDANSETRON 4 MG PO TBDP: 4.0000 mg | ORAL_TABLET | Freq: Three times a day (TID) | ORAL | 0 refills | Status: AC | PRN

## 2019-12-30 MED ORDER — ONDANSETRON HCL 4 MG/2ML IJ SOLN: 4.0000 mg | Freq: Once | INTRAMUSCULAR | Status: DC

## 2019-12-30 MED ORDER — ACETAMINOPHEN 325 MG PO TABS
650.0000 mg | ORAL_TABLET | Freq: Once | ORAL | Status: AC
  Administered 2019-12-30: 650 mg via ORAL
  Filled 2019-12-30: qty 2

## 2019-12-30 MED ORDER — ALBUTEROL SULFATE HFA 108 (90 BASE) MCG/ACT IN AERS
1.0000 | INHALATION_SPRAY | Freq: Once | RESPIRATORY_TRACT | Status: AC
  Administered 2019-12-30: 1 via RESPIRATORY_TRACT
  Filled 2019-12-30: qty 6.7

## 2019-12-30 MED ORDER — ONDANSETRON 4 MG PO TBDP
4.0000 mg | ORAL_TABLET | Freq: Once | ORAL | Status: AC
  Administered 2019-12-30: 4 mg via ORAL
  Filled 2019-12-30: qty 1

## 2019-12-30 NOTE — ED Notes (Signed)
Also reports SOB x 4 days with N/V/D.

## 2019-12-30 NOTE — Discharge Instructions (Addendum)
We will contact you with results of your lab work when it is available. Take the Zofran to help with nausea. Make sure you are drinking plenty of fluids. Use the albuterol inhaler as needed to help with shortness of breath. Return to the ER if you start to have worsening shortness of breath, chest pain, increased vomiting or abdominal pain.

## 2019-12-30 NOTE — ED Triage Notes (Signed)
Pt reports n/v/d cough, body aches and chills x4 days. Denies sick contacts. resp e/u, nad.

## 2019-12-30 NOTE — ED Provider Notes (Signed)
[facility] EMERGENCY DEPARTMENT Provider Note   CSN: [id number] Arrival date & time: 12/30/19  1941     History Chief Complaint  Patient presents with  . Emesis  . Cough  . Diarrhea    [name] is a 39 y.o. male with a past medical history of schizophrenia, Crohn's disease, sickle cell anemia presenting to the ED with a chief complaint of generalized body aches, cough productive with mucus, diarrhea, vomiting, chills for the past 4 days.  No known COVID-19 exposures or sick contacts with similar symptoms.  He has not taken any medications help with the symptoms.  Reports generalized fatigue and decreased appetite as well.  He does have history of tobacco use but denies any wheezing or other chronic lung disease.  He reports generalized abdominal cramping sensation.  Denies any bloody diarrhea or bloody vomiting, denies urinary symptoms, chest pain, hemoptysis, leg swelling, injuries or falls.  HPI     Past Medical History:  Diagnosis Date  . Ankle fracture, bimalleolar, closed, right, initial encounter   . Crohn's disease ([place])   . GI (gastrointestinal bleed) 04/2013  . Multiple myeloma ([place])   . Schizophrenia ([place])   . Sickle cell anemia ([facility])   . Suicide attempt by drug ingestion ([place])   . Ulcerative colitis     Patient Active Problem List   Diagnosis Date Noted  . 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 Schizophrenia ([place]) 02/23/2019  . Non compliance w medication regimen   . Cocaine use disorder, moderate, dependence ([place]) 06/09/2016  . Cannabis use disorder, severe, dependence ([place]) 06/09/2016  . Cocaine dependence with cocaine-induced mood disorder ([place]) 03/19/2016  . Cocaine abuse with cocaine-induced mood disorder ([place]) 03/03/2016  . 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 Suicidal ideation   . Reflux esophagitis 05/24/2013  . Other specified gastritis without mention of  hemorrhage 05/24/2013  . Nausea with vomiting 05/23/2013  . Anemia, unspecified 05/23/2013  . Nonspecific abnormal finding in stool contents 05/23/2013  . Paranoid schizophrenia ([place])   . Ulcerative colitis ([place])   . SUBSTANCE ABUSE, MULTIPLE 12/31/2007    Past Surgical History:  Procedure Laterality Date  . ESOPHAGOGASTRODUODENOSCOPY N/A 05/24/2013   Procedure: ESOPHAGOGASTRODUODENOSCOPY (EGD);  Surgeon: Ladene Artist, MD;  Location: [facility] ENDOSCOPY;  Service: Endoscopy;  Laterality: N/A;  . FLEXIBLE SIGMOIDOSCOPY  12/05/2011   Procedure: FLEXIBLE SIGMOIDOSCOPY;  Surgeon: Beryle Beams, MD;  Location: [facility] ENDOSCOPY;  Service: Endoscopy;  Laterality: N/A;  . MANDIBLE FRACTURE SURGERY    . ORIF ANKLE FRACTURE Right 07/09/2019   Procedure: OPEN REDUCTION INTERNAL FIXATION ANKLE FRACTURE;  Surgeon: Hiram Gash, MD;  Location: [facility];  Service: Orthopedics;  Laterality: Right;       Family History  Problem Relation Age of Onset  . Liver cancer Other     Social History   Tobacco Use  . Smoking status: Current Some Day Smoker    Packs/day: 0.25    Years: 20.00    Pack years: 5.00    Types: Cigarettes  . Smokeless tobacco: Never Used  Substance Use Topics  . Alcohol use: Yes    Comment: occasional  . Drug use: Yes    Types: Cocaine, Marijuana    Comment: +THC; +COC per chart    Home Medications Prior to Admission medications   Medication Sig Start Date End Date Taking? Authorizing Provider  haloperidol (HALDOL) 10 MG tablet Take 20 mg by mouth at bedtime.  02/26/18  Yes [provider]  haloperidol decanoate (HALDOL DECANOATE) 100 MG/ML injection Inject 100 mg into the muscle every 28 (twenty-eight) days. 02/14/18  Yes [provider]  ondansetron (ZOFRAN ODT) 4 MG disintegrating tablet Take 1 tablet (4 mg total) by mouth every 8 (eight) hours as needed for nausea or vomiting. 12/30/19   Rajanee Schuelke, Nicanor Alcon, PA-C    Allergies    Depakote [divalproex sodium], Lithium,  Nsaids, and Risperdal [risperidone]  Review of Systems   Review of Systems  Constitutional: Positive for appetite change and chills. Negative for fever.  HENT: Negative for ear pain, rhinorrhea, sneezing and sore throat.   Eyes: Negative for photophobia and visual disturbance.  Respiratory: Positive for cough and shortness of breath. Negative for chest tightness and wheezing.   Cardiovascular: Negative for chest pain and palpitations.  Gastrointestinal: Positive for abdominal pain, diarrhea, nausea and vomiting. Negative for blood in stool and constipation.  Genitourinary: Negative for dysuria, hematuria and urgency.  Musculoskeletal: Negative for myalgias.  Skin: Negative for rash.  Neurological: Negative for dizziness, weakness and light-headedness.    Physical Exam Updated Vital Signs BP 135/61 (BP Location: Left Arm)   Pulse 84   Temp 98.3 F (36.8 C)   Resp 18   SpO2 99%   Physical Exam Vitals and nursing note reviewed.  Constitutional:      General: He is not in acute distress.    Appearance: He is well-developed.  HENT:     Head: Normocephalic and atraumatic.     Nose: Nose normal.  Eyes:     General: No scleral icterus.       Right eye: No discharge.        Left eye: No discharge.     Conjunctiva/sclera: Conjunctivae normal.  Cardiovascular:     Rate and Rhythm: Normal rate and regular rhythm.     Heart sounds: Normal heart sounds. No murmur. No friction rub. No gallop.   Pulmonary:     Effort: Pulmonary effort is normal. No respiratory distress.     Breath sounds: Normal breath sounds.  Abdominal:     General: Bowel sounds are normal. There is no distension.     Palpations: Abdomen is soft.     Tenderness: There is no abdominal tenderness. There is no guarding.  Musculoskeletal:        General: Normal range of motion.     Cervical back: Normal range of motion and neck supple.  Skin:    General: Skin is warm and dry.     Findings: No rash.  Neurological:       Mental Status: He is alert.     Motor: No abnormal muscle tone.     Coordination: Coordination normal.     ED Results / Procedures / Treatments   Labs (all labs ordered are listed, but only abnormal results are displayed) Labs Reviewed  COMPREHENSIVE METABOLIC PANEL - Abnormal; Notable for the following components:      Result Value   Glucose, Bld 107 (*)    All other components within normal limits  CBC WITH DIFFERENTIAL/PLATELET - Abnormal; Notable for the following components:   RDW 17.5 (*)    Eosinophils Absolute 0.8 (*)    All other components within normal limits  URINALYSIS, ROUTINE W REFLEX MICROSCOPIC - Abnormal; Notable for the following components:   Color, Urine STRAW (*)    All other components within normal limits  SARS CORONAVIRUS 2 (TAT 6-24 HRS)    EKG None  Radiology DG Chest Portable 1 View  Result Date: 12/30/2019 CLINICAL DATA:  Cough, fever, chills EXAM: PORTABLE CHEST 1 VIEW COMPARISON:  12/09/2018 FINDINGS: The heart size and mediastinal contours are within normal limits. Both lungs are clear. The visualized skeletal structures are unremarkable. IMPRESSION: No active disease. Electronically Signed   By: Kathreen Devoid   On: 12/30/2019 10:30    Procedures Procedures (including critical care time)  Medications Ordered in ED Medications  acetaminophen (TYLENOL) tablet 650 mg (650 mg Oral Given 12/30/19 1047)  albuterol (VENTOLIN HFA) 108 (90 Base) MCG/ACT inhaler 1 puff (1 puff Inhalation Given 12/30/19 1047)  ondansetron (ZOFRAN-ODT) disintegrating tablet 4 mg (4 mg Oral Given 12/30/19 1047)    ED Course  I have reviewed the triage vital signs and the nursing notes.  Pertinent labs & imaging results that were available during my care of the patient were reviewed by me and considered in my medical decision making (see chart for details).    MDM Rules/Calculators/A&P                      [name] was evaluated in Emergency Department on  12/30/19 for the symptoms described in the history of present illness. He/she was evaluated in the context of the global COVID-19 pandemic, which necessitated consideration that the patient might be at risk for infection with the SARS-CoV-2 virus that causes COVID-19. Institutional protocols and algorithms that pertain to the evaluation of patients at risk for COVID-19 are in a state of rapid change based on information released by regulatory bodies including the CDC and federal and state organizations. These policies and algorithms were followed during the patient's care in the ED.  39 year old male presents to ED with a chief complaint of generalized body aches, cough productive with mucus, diarrhea, vomiting and chills for the past 4 days.  Has not been taking any medications to help with the symptoms.  Reports nonbloody emesis and nonbloody diarrhea.  On exam abdomen is soft, nontender nondistended.  He is afebrile without recent use of antipyretics.  He is not tachycardic, tachypneic or hypoxic.  Chest x-ray shows no acute findings.  Urinalysis, CBC and CMP all unremarkable.  Patient was given water to drink after Zofran and given Tylenol and albuterol to help with symptoms.  Patient requesting discharge and states that his symptoms have improved.  Suspect that his symptoms could be due to Covid or other viral illness.  Doubt pneumonia as his chest x-ray is clear.  Patient is comfortable with discharge home with symptomatic treatment and increased fluid intake.  Patient is hemodynamically stable, in NAD, and able to ambulate in the ED. Evaluation does not show pathology that would require ongoing emergent intervention or inpatient treatment. I have personally reviewed and interpreted all lab work and imaging at today's ED visit. I explained the diagnosis to the patient. Pain has been managed and has no complaints prior to discharge. Patient is comfortable with above plan and is stable for discharge at this  time. All questions were answered prior to disposition. Strict return precautions for returning to the ED were discussed. Encouraged follow up with PCP.   An After Visit Summary was printed and given to the patient.   Portions of this note were generated with [employer]. Dictation errors may occur despite best attempts at proofreading.  Final Clinical Impression(s) / ED Diagnoses Final diagnoses:  Suspected COVID-19 virus infection    Rx / DC Orders ED Discharge Orders  Ordered    ondansetron (ZOFRAN ODT) 4 MG disintegrating tablet  Every 8 hours PRN     12/30/19 1121           Delia Heady, PA-C 12/30/19 1124    Carmin Muskrat, MD 01/02/20 [phone]

## 2020-02-14 DIAGNOSIS — R4020 Unspecified coma: Secondary | ICD-10-CM | POA: Diagnosis not present

## 2020-03-16 DIAGNOSIS — 419620001 Death: Secondary | SNOMED CT | POA: Diagnosis not present

## 2020-03-16 DEATH — deceased

## 2020-06-24 IMAGING — CT CT ABDOMEN AND PELVIS WITH CONTRAST
2 of 5 series · 16 of 46 positions shown, 18 images · IV contrast (Omni 300)
Comparison: CT of the abdomen pelvis dated 09/22/2018

CLINICAL DATA: 38-year-old male with history of Crohn's presenting
with abdominal pain. History of sickle cell disease.

EXAM:
CT ABDOMEN AND PELVIS WITH CONTRAST
TECHNIQUE: Multidetector CT imaging of the abdomen and pelvis was performed
using the standard protocol following bolus administration of
intravenous contrast.
CONTRAST:  125mL OMNIPAQUE IOHEXOL 300 MG/ML  SOLN

[Series 3: a/p w/ 5mm · axial · 0.86mm/px · z∈[-422,+58]mm · 13 of 108 slices shown, 15 images]
[im 6/108  soft-tissue]
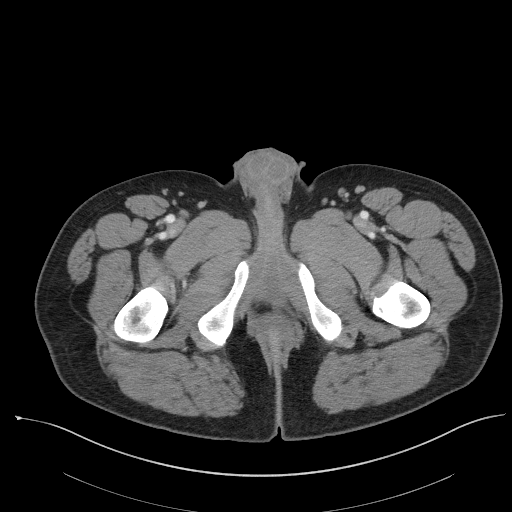
[im 6/108  bone]
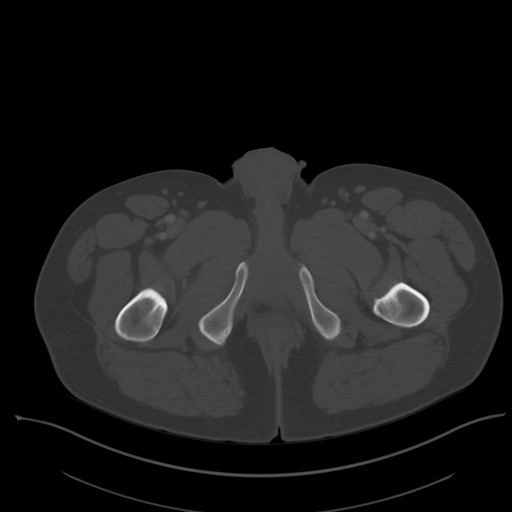
[im 17/108  soft-tissue]
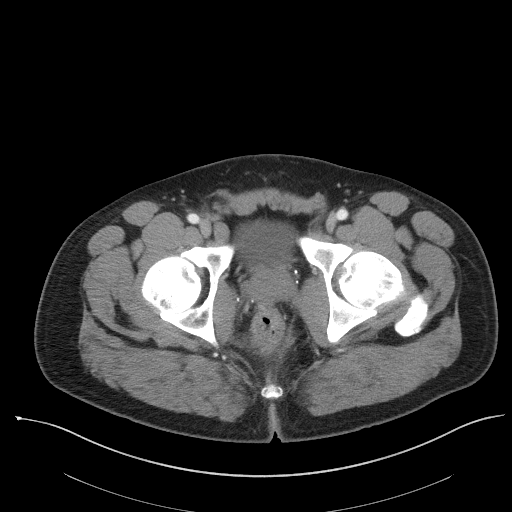
[im 23/108  soft-tissue]
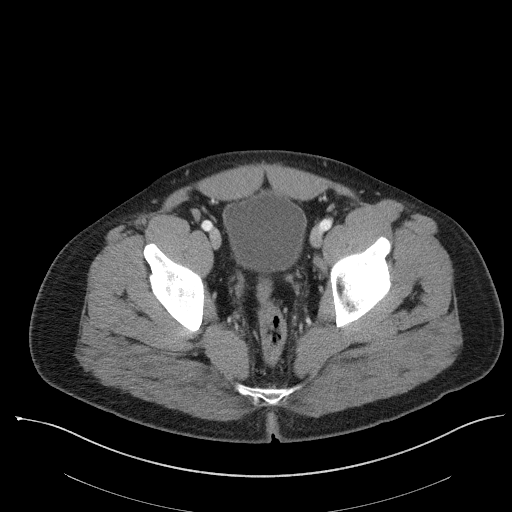
[im 29/108  soft-tissue]
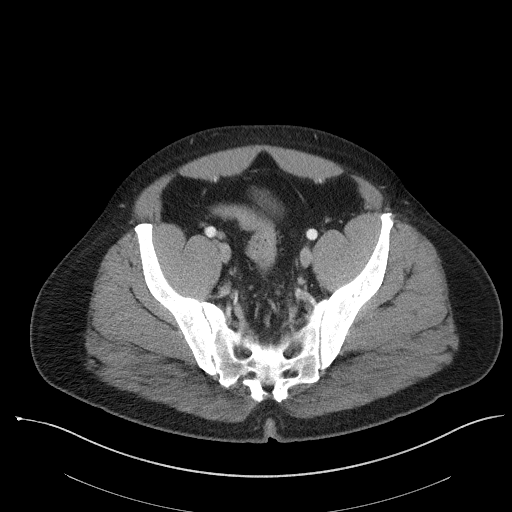
[im 40/108  soft-tissue]
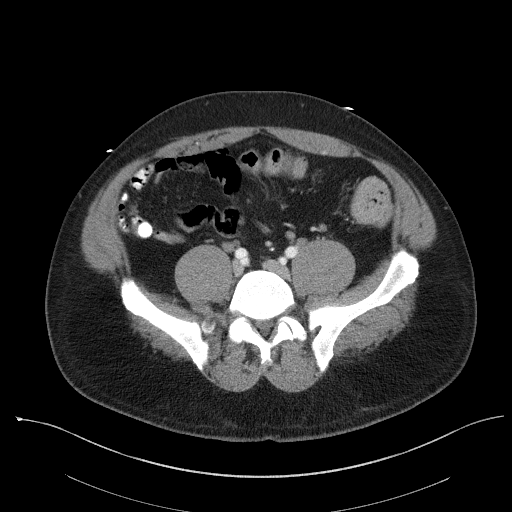
[im 46/108  soft-tissue]
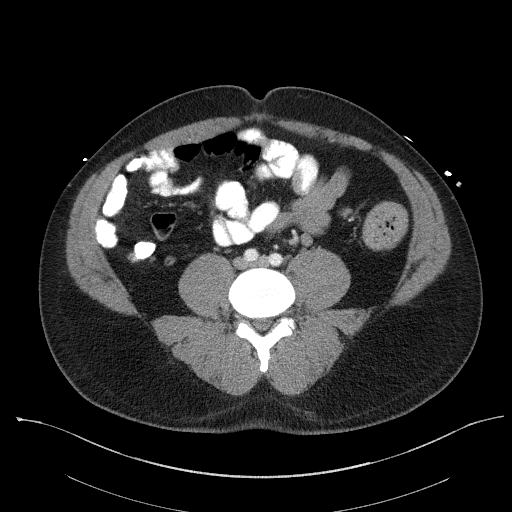
[im 57/108  soft-tissue]
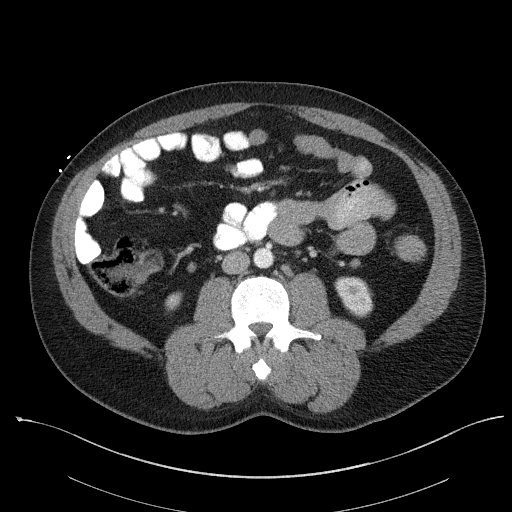
[im 62/108  soft-tissue]
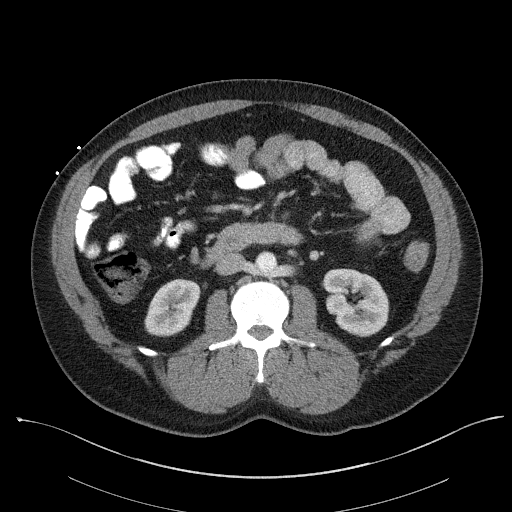
[im 68/108  soft-tissue]
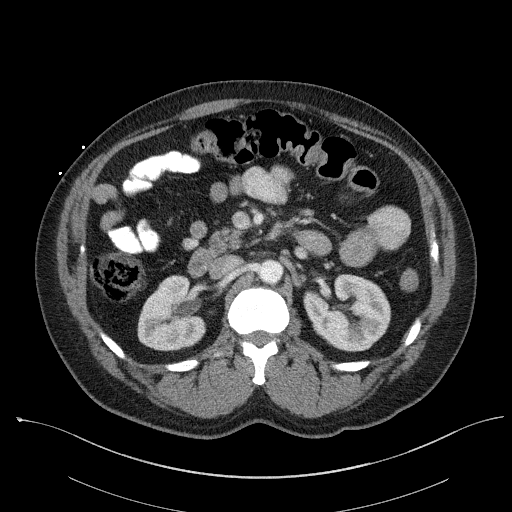
[im 68/108  bone]
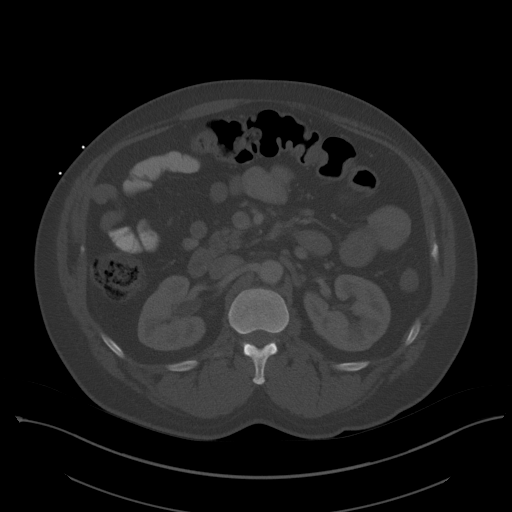
[im 79/108  soft-tissue]
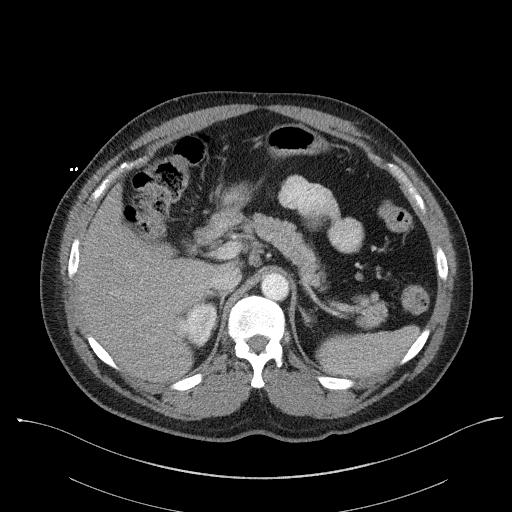
[im 85/108  soft-tissue]
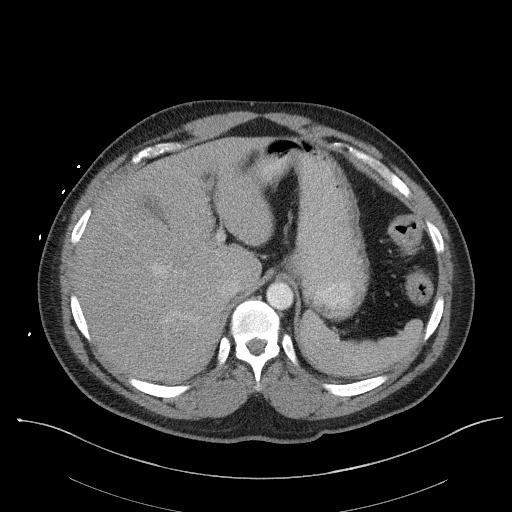
[im 91/108  soft-tissue]
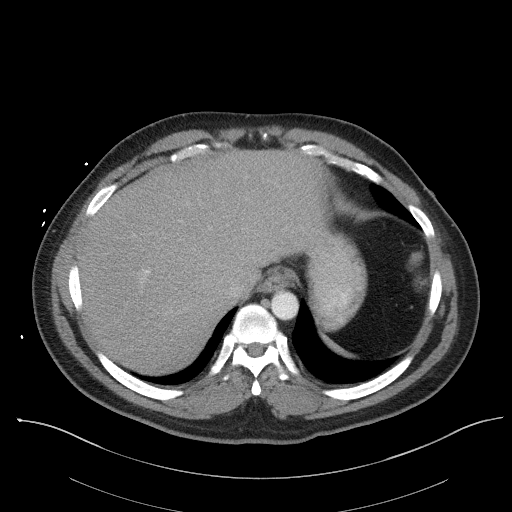
[im 102/108  soft-tissue]
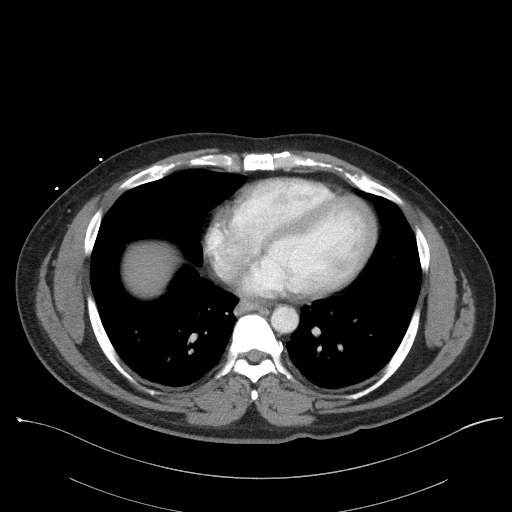

[Series 6: a/p w/ cor · coronal · 0.89mm/px · 3 of 151 slices shown]
[im 51/151  soft-tissue]
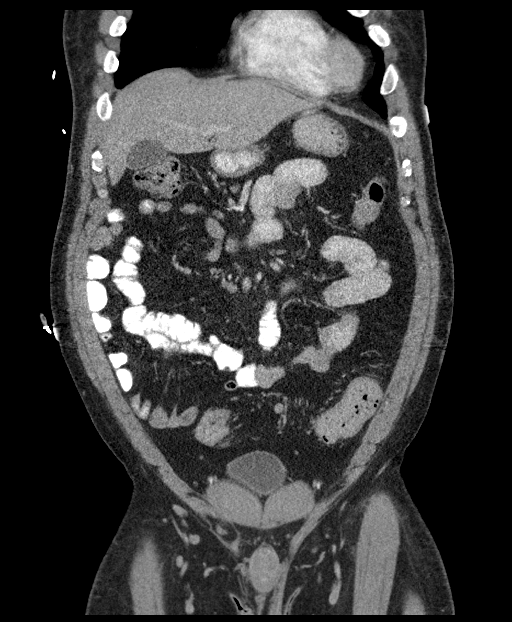
[im 67/151  soft-tissue]
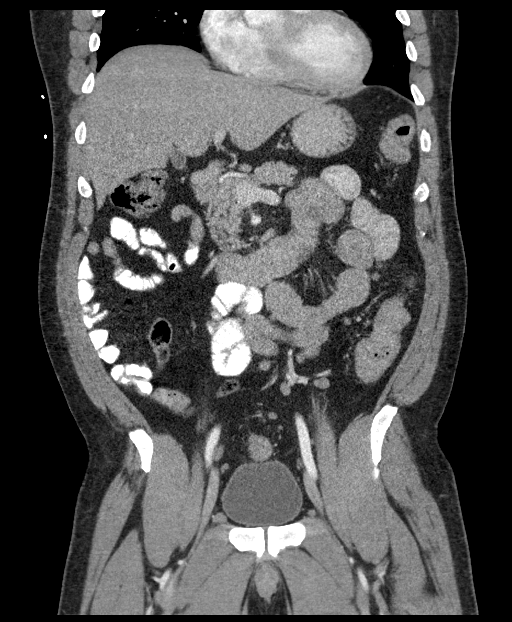
[im 84/151  soft-tissue]
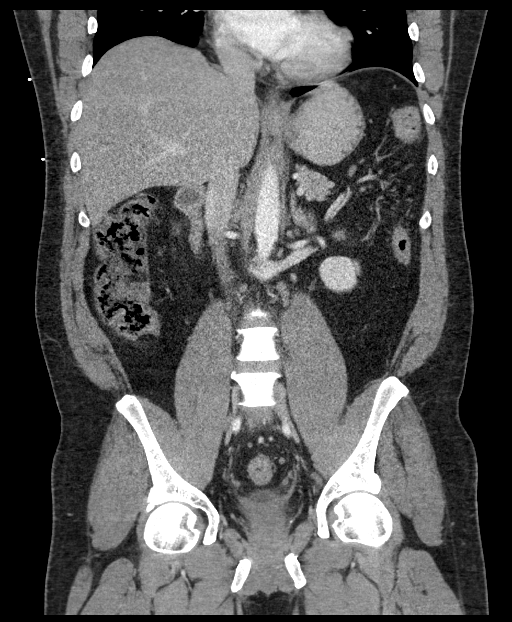

[16 of 46 positions shown; findings below may reference images not displayed]

FINDINGS: Lower chest: The visualized lung bases are clear.

No intra-abdominal free air or free fluid.

Hepatobiliary: No focal liver abnormality is seen. No gallstones,
gallbladder wall thickening, or biliary dilatation.

Pancreas: Unremarkable. No pancreatic ductal dilatation or
surrounding inflammatory changes.

Spleen: Normal in size without focal abnormality.

Adrenals/Urinary Tract: The adrenal glands are unremarkable. There
is no hydronephrosis on either side. There is symmetric enhancement
and excretion of contrast by both kidneys. The visualized ureters
and urinary bladder appear unremarkable.

Stomach/Bowel: There is no bowel obstruction or active inflammation.
The appendix is normal.

Vascular/Lymphatic: The abdominal aorta and IVC appear unremarkable.
No portal venous gas. Scattered retroperitoneal and mesenteric
adenopathy similar to prior CT.

Reproductive: The prostate and seminal vesicles are grossly
unremarkable. No pelvic mass.

Other: None

Musculoskeletal: No acute osseous pathology. Partial fusion of the
superior portion of the SI joints bilaterally, likely related to
inflammatory bowel disease. Old fracture of the left L4 transverse
process. No acute osseous pathology.
IMPRESSION: 1. No acute intra-abdominal or pelvic pathology. No bowel
obstruction or active inflammation. Normal appendix.
2. Scattered retroperitoneal and mesenteric adenopathy similar to
prior CT.
